# Patient Record
Sex: Female | Born: 1950 | Race: White | Hispanic: No | Marital: Married | Smoking: Never smoker
Health system: Southern US, Community
[De-identification: ages and names within clinical notes are randomized; demographics above are authoritative.]

## PROBLEM LIST (undated history)

## (undated) ENCOUNTER — Emergency Department (HOSPITAL_COMMUNITY): Discharge status: Absent without leave | Payer: Medicare Other

## (undated) DIAGNOSIS — I509 Heart failure, unspecified: Secondary | ICD-10-CM

## (undated) DIAGNOSIS — I5022 Chronic systolic (congestive) heart failure: Secondary | ICD-10-CM

## (undated) DIAGNOSIS — E785 Hyperlipidemia, unspecified: Secondary | ICD-10-CM

## (undated) DIAGNOSIS — I429 Cardiomyopathy, unspecified: Secondary | ICD-10-CM

## (undated) DIAGNOSIS — E039 Hypothyroidism, unspecified: Secondary | ICD-10-CM

## (undated) DIAGNOSIS — E119 Type 2 diabetes mellitus without complications: Secondary | ICD-10-CM

## (undated) DIAGNOSIS — I4891 Unspecified atrial fibrillation: Secondary | ICD-10-CM

## (undated) DIAGNOSIS — M109 Gout, unspecified: Secondary | ICD-10-CM

## (undated) DIAGNOSIS — Z95810 Presence of automatic (implantable) cardiac defibrillator: Secondary | ICD-10-CM

## (undated) DIAGNOSIS — I10 Essential (primary) hypertension: Secondary | ICD-10-CM

## (undated) DIAGNOSIS — I4892 Unspecified atrial flutter: Secondary | ICD-10-CM

## (undated) DIAGNOSIS — F32A Depression, unspecified: Secondary | ICD-10-CM

## (undated) DIAGNOSIS — Z9889 Other specified postprocedural states: Secondary | ICD-10-CM

## (undated) DIAGNOSIS — K219 Gastro-esophageal reflux disease without esophagitis: Secondary | ICD-10-CM

## (undated) DIAGNOSIS — F329 Major depressive disorder, single episode, unspecified: Secondary | ICD-10-CM

## (undated) HISTORY — DX: Essential (primary) hypertension: I10

## (undated) HISTORY — DX: Hypothyroidism, unspecified: E03.9

## (undated) HISTORY — PX: ANAL FISSURE REPAIR: SHX2312

## (undated) HISTORY — DX: Heart failure, unspecified: I50.9

## (undated) HISTORY — DX: Major depressive disorder, single episode, unspecified: F32.9

## (undated) HISTORY — PX: BACK SURGERY: SHX140

## (undated) HISTORY — DX: Depression, unspecified: F32.A

## (undated) HISTORY — PX: OTHER SURGICAL HISTORY: SHX169

## (undated) HISTORY — DX: Gout, unspecified: M10.9

## (undated) HISTORY — DX: Hyperlipidemia, unspecified: E78.5

---

## 2013-07-23 DIAGNOSIS — Z9889 Other specified postprocedural states: Secondary | ICD-10-CM

## 2013-07-23 HISTORY — DX: Other specified postprocedural states: Z98.890

## 2013-09-10 DIAGNOSIS — Z95810 Presence of automatic (implantable) cardiac defibrillator: Secondary | ICD-10-CM

## 2013-09-10 HISTORY — DX: Presence of automatic (implantable) cardiac defibrillator: Z95.810

## 2015-10-01 ENCOUNTER — Other Ambulatory Visit (HOSPITAL_COMMUNITY): Payer: Self-pay | Admitting: Internal Medicine

## 2015-10-01 DIAGNOSIS — Z1231 Encounter for screening mammogram for malignant neoplasm of breast: Secondary | ICD-10-CM

## 2015-10-12 ENCOUNTER — Other Ambulatory Visit (HOSPITAL_COMMUNITY)
Admission: RE | Admit: 2015-10-12 | Discharge: 2015-10-12 | Disposition: A | Discharge status: Home / Self Care | Payer: Medicare PPO | Source: Ambulatory Visit | Attending: Obstetrics & Gynecology | Admitting: Obstetrics & Gynecology

## 2015-10-12 ENCOUNTER — Encounter: Payer: Self-pay | Admitting: Obstetrics & Gynecology

## 2015-10-12 ENCOUNTER — Ambulatory Visit (INDEPENDENT_AMBULATORY_CARE_PROVIDER_SITE_OTHER): Payer: Medicare PPO | Admitting: Obstetrics & Gynecology

## 2015-10-12 VITALS — BP 120/80 | HR 72 | Ht 64.0 in | Wt 313.4 lb

## 2015-10-12 DIAGNOSIS — Z01419 Encounter for gynecological examination (general) (routine) without abnormal findings: Secondary | ICD-10-CM | POA: Insufficient documentation

## 2015-10-12 DIAGNOSIS — Z1151 Encounter for screening for human papillomavirus (HPV): Secondary | ICD-10-CM | POA: Diagnosis present

## 2015-10-12 DIAGNOSIS — Z1211 Encounter for screening for malignant neoplasm of colon: Secondary | ICD-10-CM

## 2015-10-12 DIAGNOSIS — Z1212 Encounter for screening for malignant neoplasm of rectum: Secondary | ICD-10-CM

## 2015-10-12 DIAGNOSIS — Z0000 Encounter for general adult medical examination without abnormal findings: Secondary | ICD-10-CM

## 2015-10-12 MED ORDER — TERCONAZOLE 0.4 % VA CREA: 1.0000 | TOPICAL_CREAM | Freq: Every day | VAGINAL | Status: DC

## 2015-10-12 NOTE — Addendum Note (Signed)
Addended by: Federico Flake A on: 10/12/2015 03:09 PM   Modules accepted: Orders

## 2015-10-12 NOTE — Progress Notes (Signed)
Patient ID: Donna Graves, female   DOB: 27-Sep-1950, 65 y.o.   MRN: [id number] Subjective:     Donna Graves is a 65 y.o. female here for a routine exam.  No LMP recorded. No obstetric history on file. Birth Control Method:  Post menopausal Menstrual Calendar(currently): amenorrheic x 13 years  Current complaints: none.   Current acute medical issues:     Recent Gynecologic History No LMP recorded. Last Pap: 2013,  normal Last mammogram: 2017,  normal  Past Medical History  Diagnosis Date  . Diabetes mellitus without complication (HCC)   . Hypertension   . Hyperlipidemia   . Gout     Past Surgical History  Procedure Laterality Date  . Back surgery    . Visual merchandiser    . Left toe surgery      OB History    No data available      Social History   Social History  . Marital Status: Married    Spouse Name: N/A  . Number of Children: N/A  . Years of Education: N/A   Social History Main Topics  . Smoking status: Never Smoker   . Smokeless tobacco: None  . Alcohol Use: None  . Drug Use: None  . Sexual Activity: Not Asked   Other Topics Concern  . None   Social History Narrative  . None    Family History  Problem Relation Age of Onset  . Diabetes Mother   . Diabetes Brother      Current outpatient prescriptions:  .  carvedilol (COREG) 6.25 MG tablet, Take 6.25 mg by mouth 2 (two) times daily with a meal., Disp: , Rfl:  .  enalapril (VASOTEC) 10 MG tablet, Take 10 mg by mouth daily., Disp: , Rfl:  .  furosemide (LASIX) 80 MG tablet, Take 80 mg by mouth 2 (two) times daily., Disp: , Rfl:  .  glimepiride (AMARYL) 1 MG tablet, Take 1 mg by mouth daily with breakfast., Disp: , Rfl:  .  Lactobacillus-Inulin (CULTURELLE DIGESTIVE HEALTH PO), Take by mouth., Disp: , Rfl:  .  Misc Natural Products (BLACK CHERRY CONCENTRATE PO), Take by mouth., Disp: , Rfl:  .  Omega-3 Fatty Acids (FISH OIL) 600 MG CAPS, Take by mouth., Disp: , Rfl:  .  polycarbophil (FIBERCON)  625 MG tablet, Take 625 mg by mouth daily., Disp: , Rfl:  .  potassium chloride SA (K-DUR,KLOR-CON) 20 MEQ tablet, Take 20 mEq by mouth once., Disp: , Rfl:  .  Red Yeast Rice Extract (RED YEAST RICE PO), Take by mouth., Disp: , Rfl:  .  simvastatin (ZOCOR) 40 MG tablet, Take 40 mg by mouth daily., Disp: , Rfl:   Review of Systems  Review of Systems  Constitutional: Negative for fever, chills, weight loss, malaise/fatigue and diaphoresis.  HENT: Negative for hearing loss, ear pain, nosebleeds, congestion, sore throat, neck pain, tinnitus and ear discharge.   Eyes: Negative for blurred vision, double vision, photophobia, pain, discharge and redness.  Respiratory: Negative for cough, hemoptysis, sputum production, shortness of breath, wheezing and stridor.   Cardiovascular: Negative for chest pain, palpitations, orthopnea, claudication, leg swelling and PND.  Gastrointestinal: negative for abdominal pain. Negative for heartburn, nausea, vomiting, diarrhea, constipation, blood in stool and melena.  Genitourinary: Negative for dysuria, urgency, frequency, hematuria and flank pain.  Musculoskeletal: Negative for myalgias, back pain, joint pain and falls.  Skin: Negative for itching and rash.  Neurological: Negative for dizziness, tingling, tremors, sensory change, speech change, focal  weakness, seizures, loss of consciousness, weakness and headaches.  Endo/Heme/Allergies: Negative for environmental allergies and polydipsia. Does not bruise/bleed easily.  Psychiatric/Behavioral: Negative for depression, suicidal ideas, hallucinations, memory loss and substance abuse. The patient is not nervous/anxious and does not have insomnia.        Objective:  Blood pressure 120/80, pulse 72, height  (1.626 m), weight 313 lb 6.4 oz (142.157 kg).   Physical Exam  Vitals reviewed. Constitutional: She is oriented to person, place, and time. She appears well-developed and well-nourished.        Vulva is  normal without lesions Vagina is pink moist with yeast present Cervix normal in appearance and pap is done Uterus is normal size shape and contour Adnexa is negative with normal sized ovaries  {Rectal    hemoccult negative, normal tone, no masses  Musculoskeletal: Normal range of motion. She exhibits no edema and no tenderness.  Neurological: She is alert and oriented to person, place, and time. She has normal reflexes. She displays normal reflexes. No cranial nerve deficit. She exhibits normal muscle tone. Coordination normal.  Skin: Skin is warm and dry. No rash noted. No erythema. No pallor.  Psychiatric: She has a normal mood and affect. Her behavior is normal. Judgment and thought content normal.       Assessment:    Healthy female exam.    Plan:    Follow up in: 2 years. terazol 7 for vaginal yeast

## 2015-10-14 LAB — CYTOLOGY - PAP

## 2015-10-15 ENCOUNTER — Ambulatory Visit (HOSPITAL_COMMUNITY)
Admission: RE | Admit: 2015-10-15 | Discharge: 2015-10-15 | Disposition: A | Discharge status: Home / Self Care | Payer: Medicare PPO | Source: Ambulatory Visit | Attending: Internal Medicine | Admitting: Internal Medicine

## 2015-10-15 DIAGNOSIS — Z1231 Encounter for screening mammogram for malignant neoplasm of breast: Secondary | ICD-10-CM | POA: Diagnosis present

## 2015-10-21 ENCOUNTER — Other Ambulatory Visit: Payer: Self-pay | Admitting: Internal Medicine

## 2015-10-21 DIAGNOSIS — R928 Other abnormal and inconclusive findings on diagnostic imaging of breast: Secondary | ICD-10-CM

## 2015-11-03 ENCOUNTER — Other Ambulatory Visit: Payer: Self-pay | Admitting: Internal Medicine

## 2015-11-03 DIAGNOSIS — R928 Other abnormal and inconclusive findings on diagnostic imaging of breast: Secondary | ICD-10-CM

## 2015-11-03 DIAGNOSIS — R921 Mammographic calcification found on diagnostic imaging of breast: Secondary | ICD-10-CM

## 2015-11-04 ENCOUNTER — Other Ambulatory Visit (HOSPITAL_COMMUNITY): Payer: Self-pay | Admitting: Internal Medicine

## 2015-11-04 DIAGNOSIS — R921 Mammographic calcification found on diagnostic imaging of breast: Secondary | ICD-10-CM

## 2015-11-04 DIAGNOSIS — R928 Other abnormal and inconclusive findings on diagnostic imaging of breast: Secondary | ICD-10-CM

## 2015-11-05 ENCOUNTER — Ambulatory Visit (HOSPITAL_COMMUNITY)
Admission: RE | Admit: 2015-11-05 | Discharge: 2015-11-05 | Disposition: A | Discharge status: Home / Self Care | Payer: Medicare PPO | Source: Ambulatory Visit | Attending: Internal Medicine | Admitting: Internal Medicine

## 2015-11-05 DIAGNOSIS — R928 Other abnormal and inconclusive findings on diagnostic imaging of breast: Secondary | ICD-10-CM | POA: Insufficient documentation

## 2015-11-05 DIAGNOSIS — R921 Mammographic calcification found on diagnostic imaging of breast: Secondary | ICD-10-CM

## 2015-11-10 ENCOUNTER — Encounter (HOSPITAL_COMMUNITY): Payer: Medicare PPO

## 2017-02-01 ENCOUNTER — Ambulatory Visit (HOSPITAL_COMMUNITY)
Admission: RE | Admit: 2017-02-01 | Discharge: 2017-02-01 | Disposition: A | Discharge status: Home / Self Care | Payer: Medicare PPO | Source: Ambulatory Visit | Attending: Internal Medicine | Admitting: Internal Medicine

## 2017-02-01 ENCOUNTER — Other Ambulatory Visit (HOSPITAL_COMMUNITY): Payer: Self-pay | Admitting: Internal Medicine

## 2017-02-01 DIAGNOSIS — I517 Cardiomegaly: Secondary | ICD-10-CM | POA: Diagnosis not present

## 2017-02-01 DIAGNOSIS — R0600 Dyspnea, unspecified: Secondary | ICD-10-CM

## 2017-03-08 ENCOUNTER — Encounter: Payer: Self-pay | Admitting: *Deleted

## 2017-03-09 ENCOUNTER — Encounter: Payer: Self-pay | Admitting: Internal Medicine

## 2017-03-09 ENCOUNTER — Ambulatory Visit (INDEPENDENT_AMBULATORY_CARE_PROVIDER_SITE_OTHER): Payer: Medicare PPO | Admitting: Internal Medicine

## 2017-03-09 ENCOUNTER — Other Ambulatory Visit (HOSPITAL_COMMUNITY)
Admission: RE | Admit: 2017-03-09 | Discharge: 2017-03-09 | Disposition: A | Discharge status: Home / Self Care | Payer: Medicare PPO | Source: Ambulatory Visit | Attending: Internal Medicine | Admitting: Internal Medicine

## 2017-03-09 VITALS — BP 124/76 | HR 77 | Ht 64.0 in | Wt 325.0 lb

## 2017-03-09 DIAGNOSIS — E782 Mixed hyperlipidemia: Secondary | ICD-10-CM | POA: Diagnosis not present

## 2017-03-09 DIAGNOSIS — I5022 Chronic systolic (congestive) heart failure: Secondary | ICD-10-CM | POA: Diagnosis present

## 2017-03-09 DIAGNOSIS — Z950 Presence of cardiac pacemaker: Secondary | ICD-10-CM

## 2017-03-09 LAB — COMPREHENSIVE METABOLIC PANEL
ALBUMIN: 3.9 g/dL (ref 3.5–5.0)
ALT: 14 U/L (ref 14–54)
AST: 28 U/L (ref 15–41)
Alkaline Phosphatase: 91 U/L (ref 38–126)
Anion gap: 11 (ref 5–15)
BILIRUBIN TOTAL: 1.5 mg/dL — AB (ref 0.3–1.2)
BUN: 14 mg/dL (ref 6–20)
CALCIUM: 8.9 mg/dL (ref 8.9–10.3)
CO2: 28 mmol/L (ref 22–32)
Chloride: 100 mmol/L — ABNORMAL LOW (ref 101–111)
Creatinine, Ser: 0.7 mg/dL (ref 0.44–1.00)
GFR calc Af Amer: >60 mL/min (ref >60)
GLUCOSE: 165 mg/dL — AB (ref 65–99)
Potassium: 3.5 mmol/L (ref 3.5–5.1)
Sodium: 139 mmol/L (ref 135–145)
TOTAL PROTEIN: 6.7 g/dL (ref 6.5–8.1)

## 2017-03-09 LAB — BRAIN NATRIURETIC PEPTIDE: B Natriuretic Peptide: 305 pg/mL — ABNORMAL HIGH (ref 0.0–100.0)

## 2017-03-09 NOTE — Patient Instructions (Signed)
Your physician recommends that you schedule a follow-up appointment in:  To be determined after Echocardiogram   Get Lab work today : CMET, BNP  Your physician has requested that you have an echocardiogram. Echocardiography is a painless test that uses sound waves to create images of your heart. It provides your doctor with information about the size and shape of your heart and how well your heart's chambers and valves are working. This procedure takes approximately one hour. There are no restrictions for this procedure.    Your physician recommends that you continue on your current medications as directed. Please refer to the Current Medication list given to you today.   We will call you  with Echo results. I have requested your records from Dr Earlene Plater      Thank you for choosing [facility] !

## 2017-03-09 NOTE — Progress Notes (Addendum)
Cardiology Office Note   Date:  03/09/2017   ID:  Donna Graves, DOB 28-May-1951, MRN [id number]  PCP:  Wilson Singer, MD  Cardiologist:   Dietrich Pates, MD   Pt referrred by Dr Karilyn Cota for eval/Rx of systolic CHF      History of Present Illness: Donna Graves is a 66 y.o. female with a history systolic CHF  She has been followed by a Dr Earlene Plater, originally in [place]  Now followed in [place] office  She was last seen in March 2018    Pt says she first started seeing him in early 2000s Cath done in 2000s  Told everything ws "clear" Told pumping function down (30 to 35%)  Heart wanting to go real fast   She is s/p PPM in 2005 and repeatin 2014 ([employer])    The pt says she was doing OK without swelling or without SOB until about 1 month ago  Then legs got swollen and she says she now gives out with actvity  Notes nausea Seen by Dr Karilyn Cota in May and lasix started  Seen back a couple times  Lasix increased and labs done   Has had some improvement but not at baseline    Pt denies CP  Sleeps chronically with head elevated a little  No PND   Does describe decreased appetite and dyspnea   Renal function done on 6/10     Current Meds  Medication Sig  . carvedilol (COREG) 12.5 MG tablet TK 1 T PO  BID. PATIENT NEEDS TO BE SEEN  . enalapril (VASOTEC) 10 MG tablet Take 10 mg by mouth daily.  . furosemide (LASIX) 80 MG tablet Take 160 mg by mouth 2 (two) times daily. Takes 160 mg BID per Dr Karilyn Cota  . glimepiride (AMARYL) 1 MG tablet Take 1 mg by mouth daily with breakfast.  . Lactobacillus-Inulin (CULTURELLE DIGESTIVE HEALTH PO) Take by mouth.  . Misc Natural Products (BLACK CHERRY CONCENTRATE PO) Take by mouth.  . Omega-3 Fatty Acids (FISH OIL) 600 MG CAPS Take by mouth.  . polycarbophil (FIBERCON) 625 MG tablet Take 625 mg by mouth daily.  . potassium chloride SA (K-DUR,KLOR-CON) 20 MEQ tablet Take 20 mEq by mouth 2 (two) times daily.  . Red Yeast Rice Extract (RED YEAST RICE  PO) Take by mouth.  . simvastatin (ZOCOR) 40 MG tablet Take 40 mg by mouth daily.  Marland Kitchen terconazole (TERAZOL 7) 0.4 % vaginal cream Place 1 applicator vaginally at bedtime.     Allergies:   Codeine   Past Medical History:  Diagnosis Date  . CHF (congestive heart failure) (HCC)   . Depression   . Diabetes mellitus without complication (HCC)   . 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 Gout   . Hyperlipidemia   . Hypertension   . Hypothyroidism     Past Surgical History:  Procedure Laterality Date  . BACK SURGERY    . left toe surgery    . pace maker       Social History:  The patient  reports that she has never smoked. She has never used smokeless tobacco.   Family History:  The patient's family history includes Arthritis (age of onset: 37) in her father; Diabetes (age of onset: 53) in her brother; Diabetes (age of onset: 6) in her mother; Heart attack in her mother; Hypertension in her mother.    ROS:  Please see the history of  present illness. All other systems are reviewed and  Negative to the above problem except as noted.    PHYSICAL EXAM: VS:  BP 124/76 (BP Location: Right Arm)   Pulse 77   Ht 5\' 4"  (1.626 m)   Wt (!) 147.4 kg (325 lb)   SpO2 94%   BMI 55.79 kg/m   GEN:  Morbidly obese 66 yo in NAD  Examined in chair   HEENT: normal  Neck: Neck is full  Difficult to assess JVP  No, carotid bruits, or masses Cardiac: RRR; no murmurs, rubs, or gallops,Distant heart sounds   1+ edema   Respiratory:  clear to auscultation bilaterally, normal work of breathing GI:   Distended   Pannus  Nontender  No definte masses  MS: no deformity Moving all extremities   Skin: warm and dry, no rash Neuro:  Strength and sensation are intact Psych: euthymic mood, full affect   EKG:  EKG is ordered today. SR 79 bpm  Paced   Lipid Panel No results found for: CHOL, TRIG, HDL, CHOLHDL, VLDL, LDLCALC, LDLDIRECT    Wt Readings from Last 3  Encounters:  03/09/17 (!) 147.4 kg (325 lb)  10/12/15 (!) 142.2 kg (313 lb 6.4 oz)      ASSESSMENT AND PLAN:  1  Chronic systolic CHF   Pt was compensated until recently  Not sure what lead to clinical decline  Will get records from [place]  Set up for echo  Get BMET    Changes in meds baed on results  2.  Hx PPM  Get recoreds from [place]    3  HL  Get records re lipids  Continue statin     Current medicines are reviewed at length with the patient today.  The patient does not have concerns regarding medicines.  ADDENDUM  ( 03/16/17)  Atrial flutter  S/p ablation attemps in 2006  Unsuccessful; s/p AV node ablation with single chamber pacer.  Replaced with dual chamber (AV) in 2014 Seen by Dr c Collier Bullock and S. Irene Limbo   Pacer intrrogated in March 2018  Metronic DDDR  Pacingin 100%    Nonobstructive CAD  By cath November  2014 HTN HL Diabetes NSVT  Echo Nov 2014 LVEF low normal to mildly depressed  RV difficult to see  Mod TR  Signed, Dietrich Pates, MD  03/09/2017 10:01 AM    [facility] [address] [place], [place], [place]  [address] Phone: [phone]; Fax: [phone]

## 2017-03-17 ENCOUNTER — Ambulatory Visit (HOSPITAL_COMMUNITY)
Admission: RE | Admit: 2017-03-17 | Discharge: 2017-03-17 | Disposition: A | Discharge status: Home / Self Care | Payer: Medicare PPO | Source: Ambulatory Visit | Attending: Internal Medicine | Admitting: Internal Medicine

## 2017-03-17 DIAGNOSIS — I081 Rheumatic disorders of both mitral and tricuspid valves: Secondary | ICD-10-CM | POA: Diagnosis not present

## 2017-03-17 DIAGNOSIS — I5022 Chronic systolic (congestive) heart failure: Secondary | ICD-10-CM | POA: Diagnosis present

## 2017-03-17 NOTE — Progress Notes (Signed)
*  PRELIMINARY RESULTS* Echocardiogram 2D Echocardiogram has been performed.  Stacey Drain 03/17/2017, 12:45 PM

## 2017-03-21 ENCOUNTER — Telehealth: Payer: Self-pay

## 2017-03-21 DIAGNOSIS — Z79899 Other long term (current) drug therapy: Secondary | ICD-10-CM

## 2017-03-21 MED ORDER — METOLAZONE 2.5 MG PO TABS: ORAL_TABLET | ORAL | 3 refills | Status: DC

## 2017-03-21 NOTE — Telephone Encounter (Signed)
-----   Message from Dietrich Pates V, MD sent at 03/20/2017 10:00 PM EDT ----- Echo shows pumping function has declined from what was reported from one done in [place]   Need to  1  Get appt in pacemaker clinic to evaluate  Pt may need pacer upgrade Would be good to get operative note from where device was put in 3.  She can try 2.5 Zaoxyln 2x per week (30 min before lasix given  )   Get BMET and BNP in 2 wk    4  Watch and cut back on salt intake

## 2017-03-21 NOTE — Telephone Encounter (Signed)
Pt notified of echo results, will take zaroxolyn 2.5 mg twice a week, 30 minutes before lasix, will get apt for pacer clinic,she will limit sodium intake, mailed lab slips   Apt made for pacemaker clinic Dr Ladona Ridgel 8/30 at 9 am

## 2017-03-30 ENCOUNTER — Observation Stay (HOSPITAL_COMMUNITY)
Admission: EM | Admit: 2017-03-30 | Discharge: 2017-03-31 | Disposition: A | Discharge status: Home / Self Care | Payer: Medicare PPO | Attending: Internal Medicine | Admitting: Internal Medicine

## 2017-03-30 ENCOUNTER — Encounter (HOSPITAL_COMMUNITY): Payer: Self-pay | Admitting: Emergency Medicine

## 2017-03-30 ENCOUNTER — Emergency Department (HOSPITAL_COMMUNITY): Payer: Medicare PPO

## 2017-03-30 DIAGNOSIS — R748 Abnormal levels of other serum enzymes: Secondary | ICD-10-CM

## 2017-03-30 DIAGNOSIS — Z950 Presence of cardiac pacemaker: Secondary | ICD-10-CM | POA: Diagnosis not present

## 2017-03-30 DIAGNOSIS — I5022 Chronic systolic (congestive) heart failure: Secondary | ICD-10-CM | POA: Diagnosis not present

## 2017-03-30 DIAGNOSIS — E118 Type 2 diabetes mellitus with unspecified complications: Secondary | ICD-10-CM | POA: Diagnosis not present

## 2017-03-30 DIAGNOSIS — I2583 Coronary atherosclerosis due to lipid rich plaque: Secondary | ICD-10-CM

## 2017-03-30 DIAGNOSIS — E119 Type 2 diabetes mellitus without complications: Secondary | ICD-10-CM | POA: Diagnosis not present

## 2017-03-30 DIAGNOSIS — I509 Heart failure, unspecified: Secondary | ICD-10-CM | POA: Insufficient documentation

## 2017-03-30 DIAGNOSIS — I10 Essential (primary) hypertension: Secondary | ICD-10-CM | POA: Diagnosis present

## 2017-03-30 DIAGNOSIS — I480 Paroxysmal atrial fibrillation: Secondary | ICD-10-CM | POA: Diagnosis present

## 2017-03-30 DIAGNOSIS — I2510 Atherosclerotic heart disease of native coronary artery without angina pectoris: Secondary | ICD-10-CM | POA: Diagnosis not present

## 2017-03-30 DIAGNOSIS — Z79899 Other long term (current) drug therapy: Secondary | ICD-10-CM | POA: Diagnosis not present

## 2017-03-30 DIAGNOSIS — R0789 Other chest pain: Secondary | ICD-10-CM

## 2017-03-30 DIAGNOSIS — J810 Acute pulmonary edema: Secondary | ICD-10-CM | POA: Diagnosis not present

## 2017-03-30 DIAGNOSIS — I5023 Acute on chronic systolic (congestive) heart failure: Secondary | ICD-10-CM | POA: Diagnosis present

## 2017-03-30 DIAGNOSIS — R55 Syncope and collapse: Secondary | ICD-10-CM | POA: Diagnosis not present

## 2017-03-30 DIAGNOSIS — E039 Hypothyroidism, unspecified: Secondary | ICD-10-CM | POA: Insufficient documentation

## 2017-03-30 LAB — BRAIN NATRIURETIC PEPTIDE: B NATRIURETIC PEPTIDE 5: 288 pg/mL — AB (ref 0.0–100.0)

## 2017-03-30 LAB — URINALYSIS, ROUTINE W REFLEX MICROSCOPIC
BILIRUBIN URINE: NEGATIVE
Glucose, UA: NEGATIVE mg/dL
Hgb urine dipstick: NEGATIVE
Ketones, ur: NEGATIVE mg/dL
LEUKOCYTES UA: NEGATIVE
NITRITE: NEGATIVE
PH: 7 (ref 5.0–8.0)
Protein, ur: NEGATIVE mg/dL
SPECIFIC GRAVITY, URINE: 1.014 (ref 1.005–1.030)

## 2017-03-30 LAB — D-DIMER, QUANTITATIVE (NOT AT ARMC): D DIMER QUANT: 0.37 ug{FEU}/mL (ref 0.00–0.50)

## 2017-03-30 LAB — CBC
HEMATOCRIT: 52.6 % — AB (ref 36.0–46.0)
HEMOGLOBIN: 17.1 g/dL — AB (ref 12.0–15.0)
MCH: 29.5 pg (ref 26.0–34.0)
MCHC: 32.5 g/dL (ref 30.0–36.0)
MCV: 90.7 fL (ref 78.0–100.0)
Platelets: 157 10*3/uL (ref 150–400)
RBC: 5.8 MIL/uL — AB (ref 3.87–5.11)
RDW: 14.2 % (ref 11.5–15.5)
WBC: 6.9 10*3/uL (ref 4.0–10.5)

## 2017-03-30 LAB — HEPATIC FUNCTION PANEL
ALBUMIN: 3.9 g/dL (ref 3.5–5.0)
ALK PHOS: 118 U/L (ref 38–126)
ALT: 17 U/L (ref 14–54)
AST: 39 U/L (ref 15–41)
Bilirubin, Direct: 0.3 mg/dL (ref 0.1–0.5)
Indirect Bilirubin: 1.1 mg/dL — ABNORMAL HIGH (ref 0.3–0.9)
TOTAL PROTEIN: 6.6 g/dL (ref 6.5–8.1)
Total Bilirubin: 1.4 mg/dL — ABNORMAL HIGH (ref 0.3–1.2)

## 2017-03-30 LAB — BASIC METABOLIC PANEL
ANION GAP: 18 — AB (ref 5–15)
BUN: 26 mg/dL — ABNORMAL HIGH (ref 6–20)
CO2: 28 mmol/L (ref 22–32)
Calcium: 9.1 mg/dL (ref 8.9–10.3)
Chloride: 90 mmol/L — ABNORMAL LOW (ref 101–111)
Creatinine, Ser: 1.01 mg/dL — ABNORMAL HIGH (ref 0.44–1.00)
GFR calc Af Amer: >60 mL/min (ref >60)
GFR, EST NON AFRICAN AMERICAN: 58 mL/min — AB (ref >60)
GLUCOSE: 247 mg/dL — AB (ref 65–99)
POTASSIUM: 3.5 mmol/L (ref 3.5–5.1)
Sodium: 136 mmol/L (ref 135–145)

## 2017-03-30 LAB — I-STAT TROPONIN, ED: Troponin i, poc: 0.03 ng/mL (ref 0.00–0.08)

## 2017-03-30 LAB — CBG MONITORING, ED: Glucose-Capillary: 277 mg/dL — ABNORMAL HIGH (ref 65–99)

## 2017-03-30 LAB — TROPONIN I: TROPONIN I: 0.03 ng/mL — AB (ref <0.03)

## 2017-03-30 MED ORDER — SODIUM CHLORIDE 0.9 % IV BOLUS (SEPSIS)
500.0000 mL | Freq: Once | INTRAVENOUS | Status: AC
  Administered 2017-03-31: 500 mL via INTRAVENOUS

## 2017-03-30 MED ORDER — SODIUM CHLORIDE 0.9 % IV BOLUS (SEPSIS)
500.0000 mL | Freq: Once | INTRAVENOUS | Status: AC
  Administered 2017-03-30: 500 mL via INTRAVENOUS

## 2017-03-30 MED ORDER — MECLIZINE HCL 12.5 MG PO TABS
25.0000 mg | ORAL_TABLET | Freq: Once | ORAL | Status: AC
  Administered 2017-03-30: 25 mg via ORAL
  Filled 2017-03-30: qty 2

## 2017-03-30 NOTE — ED Triage Notes (Signed)
Pt c/o nausea, dizziness and states she feels like she is going to pass out all day.

## 2017-03-30 NOTE — ED Notes (Signed)
Date and time results received: 03/30/17  (use smartphrase ".now" to insert current time)  Test: trop Critical Value: 0.03  Name of Provider Notified: yelverton  Orders Received? none

## 2017-03-30 NOTE — ED Provider Notes (Signed)
[facility]EM[facility]Y DEPT Provider Note   CSN: [id number] Arrival date & time: 03/30/17  1935     History   Chief Complaint Chief Complaint  Patient presents with  . Weakness    HPI Donna Graves is a 66 y.o. female.  HPI Patient states she had left-sided chest discomfort this morning with waking. Associated with nausea. She's had persistent nausea throughout the day. Around 5:00 this evening she went to stand up from a sitting position and became very lightheaded. Noticed by her husband to be pale. She then sat back down. She states she is feeling better currently. She takes Lasix 80 mg twice daily. She states she has had some lower extremity swelling but that this is chronic for her. Denies any current chest pain. No shortness of breath. No diarrhea but she has had constipation for several days. Denies fever or chills. Past Medical History:  Diagnosis Date  . CHF (congestive heart failure) (HCC)   . Depression   . Diabetes mellitus without complication (HCC)   . 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 Gout   . Hyperlipidemia   . Hypertension   . Hypothyroidism     There are no active problems to display for this patient.   Past Surgical History:  Procedure Laterality Date  . BACK SURGERY    . left toe surgery    . pace maker      OB History    No data available       Home Medications    Prior to Admission medications   Medication Sig Start Date End Date Taking? Authorizing Provider  acidophilus (RISAQUAD) CAPS capsule Take 1 capsule by mouth daily. ULTIMATE FLORA PROBIOTIC   Yes [provider]  carvedilol (COREG) 12.5 MG tablet TAKE ONE TABLET BY MOUTH TWICE DAILY 03/01/17  Yes [provider]  Cholecalciferol (VITAMIN D PO) Take 7,000 Units by mouth daily.   Yes [provider]  dicyclomine (BENTYL) 10 MG capsule Take 10 mg by mouth 3 (three) times daily as needed for spasms.   Yes [provider]  enalapril (VASOTEC) 10 MG tablet Take 10 mg by mouth 2 (two) times daily.    Yes [provider]  furosemide (LASIX) 80 MG tablet Take 160 mg by mouth 2 (two) times daily.  03/07/17  Yes [provider]  glimepiride (AMARYL) 2 MG tablet Take 2 mg by mouth daily. BEFORE A MEAL   Yes [provider]  magnesium oxide (MAG-OX) 400 MG tablet Take 400 mg by mouth daily.   Yes [provider]  metolazone (ZAROXOLYN) 2.5 MG tablet Take 2.5 mg two times a week Monday and Thursday, 30 minutes before lasix 03/21/17  Yes Pricilla Riffle, MD  Misc Natural Products (BLACK CHERRY CONCENTRATE PO) Take 2 tablets by mouth daily.    Yes [provider]  Multiple Vitamins-Minerals (ALIVE ONCE DAILY WOMENS 50+ PO) Take 1 tablet by mouth daily.   Yes [provider]  omeprazole (PRILOSEC) 20 MG capsule Take 20 mg by mouth every morning.   Yes [provider]  polycarbophil (FIBERCON) 625 MG tablet Take 625 mg by mouth 4 (four) times daily.   Yes [provider]  potassium chloride SA (K-DUR,KLOR-CON) 20 MEQ tablet Take 20 mEq by mouth 2 (two) times daily.   Yes [provider]  Red Yeast Rice Extract (RED YEAST RICE PO) Take 2 tablets by mouth daily.  Yes [provider]  simvastatin (ZOCOR) 40 MG tablet Take 40 mg by mouth every evening.    Yes [provider]    Family History Family History  Problem Relation Age of Onset  . Diabetes Mother 40  . Heart attack Mother   . Hypertension Mother   . Diabetes Brother 53  . Arthritis Father 48    Social History Social History  Substance Use Topics  . Smoking status: Never Smoker  . Smokeless tobacco: Never Used  . Alcohol use No     Allergies   Codeine   Review of Systems Review of Systems  Constitutional: Positive for fatigue. Negative for chills and fever.  HENT: Negative for trouble swallowing and voice change.   Eyes: Negative for visual  disturbance.  Respiratory: Negative for cough and shortness of breath.   Cardiovascular: Positive for leg swelling. Negative for chest pain and palpitations.  Gastrointestinal: Positive for constipation and nausea. Negative for abdominal pain, diarrhea and vomiting.  Genitourinary: Negative for dysuria, flank pain and frequency.  Musculoskeletal: Negative for back pain, myalgias, neck pain and neck stiffness.  Skin: Negative for rash and wound.  Neurological: Positive for dizziness and light-headedness. Negative for syncope, weakness, numbness and headaches.  All other systems reviewed and are negative.    Physical Exam Updated Vital Signs BP 116/74   Pulse 71   Temp 98 F (36.7 C) (Oral)   Resp (!) 21   Ht 5\' 4"  (1.626 m)   Wt (!) 147.4 kg (325 lb)   SpO2 93%   BMI 55.79 kg/m   Physical Exam  Constitutional: She is oriented to person, place, and time. She appears well-developed and well-nourished. No distress.  HENT:  Head: Normocephalic and atraumatic.  Mouth/Throat: Oropharynx is clear and moist. No oropharyngeal exudate.  Eyes: Pupils are equal, round, and reactive to light. EOM are normal. Right eye exhibits no discharge. Left eye exhibits no discharge. No scleral icterus.  Patient has a fatigable horizontal nystagmus  Neck: Normal range of motion. Neck supple. No tracheal deviation present. No thyromegaly present.  Cardiovascular: Normal rate and regular rhythm.  Exam reveals no gallop and no friction rub.   No murmur heard. Pulmonary/Chest: Effort normal and breath sounds normal. No stridor. No respiratory distress. She has no wheezes. She has no rales. She exhibits no tenderness.  Decreased breath sounds in the right base.  Abdominal: Soft. Bowel sounds are normal. There is no tenderness. There is no rebound and no guarding.  Musculoskeletal: Normal range of motion. She exhibits edema. She exhibits no tenderness.  Bilateral lower extremity edema. Right calf appears  slightly larger than the left calf.  Lymphadenopathy:    She has no cervical adenopathy.  Neurological: She is alert and oriented to person, place, and time.  Patient is alert and oriented x3 with clear, goal oriented speech. Patient has 5/5 motor in all extremities. Sensation is intact to light touch. Bilateral finger-to-nose is normal with no signs of dysmetria.  Skin: Skin is warm and dry. No rash noted. No erythema.  Psychiatric: She has a normal mood and affect. Her behavior is normal.  Nursing note and vitals reviewed.    ED Treatments / Results  Labs (all labs ordered are listed, but only abnormal results are displayed) Labs Reviewed  BASIC METABOLIC PANEL - Abnormal; Notable for the following:       Result Value   Chloride 90 (*)    Glucose, Bld 247 (*)    BUN 26 (*)  Creatinine, Ser 1.01 (*)    GFR calc non Af Amer 58 (*)    Anion gap 18 (*)    All other components within normal limits  CBC - Abnormal; Notable for the following:    RBC 5.80 (*)    Hemoglobin 17.1 (*)    HCT 52.6 (*)    All other components within normal limits  HEPATIC FUNCTION PANEL - Abnormal; Notable for the following:    Total Bilirubin 1.4 (*)    Indirect Bilirubin 1.1 (*)    All other components within normal limits  BRAIN NATRIURETIC PEPTIDE - Abnormal; Notable for the following:    B Natriuretic Peptide 288.0 (*)    All other components within normal limits  TROPONIN I - Abnormal; Notable for the following:    Troponin I 0.03 (*)    All other components within normal limits  CBG MONITORING, ED - Abnormal; Notable for the following:    Glucose-Capillary 277 (*)    All other components within normal limits  URINALYSIS, ROUTINE W REFLEX MICROSCOPIC  D-DIMER, QUANTITATIVE (NOT AT [facility])    EKG  EKG Interpretation  Date/Time:  Thursday March 30 2017 19:46:18 EDT Ventricular Rate:  73 PR Interval:  186 QRS Duration: 162 QT Interval:  452 QTC Calculation: 497 R Axis:   34 Text  Interpretation:  AV dual-paced rhythm Abnormal ECG Confirmed by Ranae Palms  MD, Leyland Kenna ([id number]) on 03/30/2017 8:29:27 PM       Radiology Dg Chest 2 View  Result Date: 03/30/2017 CLINICAL DATA:  Initial evaluation for acute chest pain, nausea, dizziness. EXAM: CHEST  2 VIEW COMPARISON:  Prior radiograph from 02/01/2017. FINDINGS: Left-sided Raynelle Fanning transvenous pacemaker/ AICD in place. Stable cardiomegaly. Mediastinal silhouette normal. Lungs hypoinflated. Diffuse pulmonary vascular congestion with interstitial prominence, suggesting mild pulmonary interstitial edema. No focal infiltrates. No pleural effusion. No pneumothorax. No acute osseus abnormality. IMPRESSION: 1. Cardiomegaly with findings suggestion of mild diffuse pulmonary interstitial edema. 2. Left-sided transvenous pacemaker/AICD in place. Electronically Signed   By: Rise Mu M.D.   On: 03/30/2017 22:02    Procedures Procedures (including critical care time)  Medications Ordered in ED Medications  sodium chloride 0.9 % bolus 500 mL (not administered)  sodium chloride 0.9 % bolus 500 mL (500 mLs Intravenous New Bag/Given 03/30/17 2146)  meclizine (ANTIVERT) tablet 25 mg (25 mg Oral Given 03/30/17 2325)     Initial Impression / Assessment and Plan / ED Course  I have reviewed the triage vital signs and the nursing notes.  Pertinent labs & imaging results that were available during my care of the patient were reviewed by me and considered in my medical decision making (see chart for details).     Patient states she is feeling much better. She is able to ambulate without difficulty. Denies any lightheadedness or dizziness. No shortness of breath or chest pain. She does have pulmonary edema on chest x-ray though BNP has improved since last month. D-dimer is negative. Initial troponin is 0.03. Giving complicated cardiac history we'll admit for telemetry observation. Will need repeat troponin and pacemaker  interrogation.  Final Clinical Impressions(s) / ED Diagnoses   Final diagnoses:  Near syncope  Atypical chest pain  Acute pulmonary edema [facility])    New Prescriptions New Prescriptions   No medications on file     Loren Racer, MD 03/30/17 2346

## 2017-03-31 DIAGNOSIS — R55 Syncope and collapse: Secondary | ICD-10-CM | POA: Diagnosis not present

## 2017-03-31 DIAGNOSIS — R7989 Other specified abnormal findings of blood chemistry: Secondary | ICD-10-CM

## 2017-03-31 DIAGNOSIS — R0789 Other chest pain: Secondary | ICD-10-CM | POA: Insufficient documentation

## 2017-03-31 DIAGNOSIS — R778 Other specified abnormalities of plasma proteins: Secondary | ICD-10-CM | POA: Insufficient documentation

## 2017-03-31 LAB — BASIC METABOLIC PANEL
Anion gap: 9 (ref 5–15)
BUN: 23 mg/dL — ABNORMAL HIGH (ref 6–20)
CHLORIDE: 95 mmol/L — AB (ref 101–111)
CO2: 32 mmol/L (ref 22–32)
CREATININE: 0.71 mg/dL (ref 0.44–1.00)
Calcium: 8.7 mg/dL — ABNORMAL LOW (ref 8.9–10.3)
Glucose, Bld: 170 mg/dL — ABNORMAL HIGH (ref 65–99)
Potassium: 3.2 mmol/L — ABNORMAL LOW (ref 3.5–5.1)
SODIUM: 136 mmol/L (ref 135–145)

## 2017-03-31 LAB — CBC
HCT: 51.9 % — ABNORMAL HIGH (ref 36.0–46.0)
Hemoglobin: 16.5 g/dL — ABNORMAL HIGH (ref 12.0–15.0)
MCH: 29.3 pg (ref 26.0–34.0)
MCHC: 31.8 g/dL (ref 30.0–36.0)
MCV: 92 fL (ref 78.0–100.0)
Platelets: 146 10*3/uL — ABNORMAL LOW (ref 150–400)
RBC: 5.64 MIL/uL — AB (ref 3.87–5.11)
RDW: 14.2 % (ref 11.5–15.5)
WBC: 7.1 10*3/uL (ref 4.0–10.5)

## 2017-03-31 LAB — GLUCOSE, CAPILLARY
GLUCOSE-CAPILLARY: 199 mg/dL — AB (ref 65–99)
GLUCOSE-CAPILLARY: 182 mg/dL — AB (ref 65–99)
GLUCOSE-CAPILLARY: 266 mg/dL — AB (ref 65–99)

## 2017-03-31 LAB — TROPONIN I
TROPONIN I: 0.03 ng/mL — AB (ref <0.03)
Troponin I: 0.03 ng/mL (ref <0.03)

## 2017-03-31 MED ORDER — ACETAMINOPHEN 325 MG PO TABS: 650.0000 mg | ORAL_TABLET | Freq: Four times a day (QID) | ORAL | Status: DC | PRN

## 2017-03-31 MED ORDER — INSULIN ASPART 100 UNIT/ML ~~LOC~~ SOLN: 0.0000 [IU] | Freq: Every day | SUBCUTANEOUS | Status: DC

## 2017-03-31 MED ORDER — PANTOPRAZOLE SODIUM 40 MG PO TBEC
40.0000 mg | DELAYED_RELEASE_TABLET | Freq: Every day | ORAL | Status: DC
  Administered 2017-03-31: 40 mg via ORAL
  Filled 2017-03-31: qty 1

## 2017-03-31 MED ORDER — METOLAZONE 2.5 MG PO TABS: ORAL_TABLET | ORAL | 3 refills | Status: DC

## 2017-03-31 MED ORDER — INSULIN ASPART 100 UNIT/ML ~~LOC~~ SOLN
0.0000 [IU] | Freq: Three times a day (TID) | SUBCUTANEOUS | Status: DC
Start: 2017-03-31 — End: 2017-03-31
  Administered 2017-03-31: 3 [IU] via SUBCUTANEOUS
  Administered 2017-03-31: 5 [IU] via SUBCUTANEOUS

## 2017-03-31 MED ORDER — POTASSIUM CHLORIDE CRYS ER 20 MEQ PO TBCR
20.0000 meq | EXTENDED_RELEASE_TABLET | Freq: Two times a day (BID) | ORAL | Status: DC
  Administered 2017-03-31: 20 meq via ORAL
  Filled 2017-03-31: qty 1

## 2017-03-31 MED ORDER — MAGNESIUM OXIDE 400 (241.3 MG) MG PO TABS
400.0000 mg | ORAL_TABLET | Freq: Every day | ORAL | Status: DC
  Administered 2017-03-31: 400 mg via ORAL
  Filled 2017-03-31: qty 1

## 2017-03-31 MED ORDER — RISAQUAD PO CAPS
1.0000 | ORAL_CAPSULE | Freq: Every day | ORAL | Status: DC
  Administered 2017-03-31: 1 via ORAL
  Filled 2017-03-31: qty 1

## 2017-03-31 MED ORDER — FUROSEMIDE 80 MG PO TABS: 160.0000 mg | ORAL_TABLET | Freq: Two times a day (BID) | ORAL | Status: DC

## 2017-03-31 MED ORDER — ONDANSETRON HCL 4 MG PO TABS: 4.0000 mg | ORAL_TABLET | Freq: Four times a day (QID) | ORAL | Status: DC | PRN

## 2017-03-31 MED ORDER — CARVEDILOL 12.5 MG PO TABS
12.5000 mg | ORAL_TABLET | Freq: Two times a day (BID) | ORAL | Status: DC
  Administered 2017-03-31: 12.5 mg via ORAL
  Filled 2017-03-31: qty 1

## 2017-03-31 MED ORDER — BISACODYL 5 MG PO TBEC: 5.0000 mg | DELAYED_RELEASE_TABLET | Freq: Every day | ORAL | Status: DC | PRN

## 2017-03-31 MED ORDER — SODIUM CHLORIDE 0.9% FLUSH
3.0000 mL | Freq: Two times a day (BID) | INTRAVENOUS | Status: DC
  Administered 2017-03-31 (×2): 3 mL via INTRAVENOUS

## 2017-03-31 MED ORDER — SODIUM CHLORIDE 0.9 % IV SOLN: 250.0000 mL | INTRAVENOUS | Status: DC | PRN

## 2017-03-31 MED ORDER — ONDANSETRON HCL 4 MG/2ML IJ SOLN: 4.0000 mg | Freq: Four times a day (QID) | INTRAMUSCULAR | Status: DC | PRN

## 2017-03-31 MED ORDER — SIMVASTATIN 10 MG PO TABS: 40.0000 mg | ORAL_TABLET | Freq: Every evening | ORAL | Status: DC

## 2017-03-31 MED ORDER — SODIUM CHLORIDE 0.9% FLUSH: 3.0000 mL | INTRAVENOUS | Status: DC | PRN

## 2017-03-31 MED ORDER — ADULT MULTIVITAMIN W/MINERALS CH
1.0000 | ORAL_TABLET | Freq: Every day | ORAL | Status: DC
  Administered 2017-03-31: 1 via ORAL
  Filled 2017-03-31: qty 1

## 2017-03-31 MED ORDER — CALCIUM POLYCARBOPHIL 625 MG PO TABS
625.0000 mg | ORAL_TABLET | Freq: Four times a day (QID) | ORAL | Status: DC
  Administered 2017-03-31 (×2): 625 mg via ORAL
  Filled 2017-03-31 (×14): qty 1

## 2017-03-31 MED ORDER — ACETAMINOPHEN 650 MG RE SUPP: 650.0000 mg | Freq: Four times a day (QID) | RECTAL | Status: DC | PRN

## 2017-03-31 MED ORDER — ENOXAPARIN SODIUM 40 MG/0.4ML ~~LOC~~ SOLN
40.0000 mg | SUBCUTANEOUS | Status: DC
  Administered 2017-03-31: 40 mg via SUBCUTANEOUS
  Filled 2017-03-31: qty 0.4

## 2017-03-31 MED ORDER — SODIUM CHLORIDE 0.9% FLUSH: 3.0000 mL | Freq: Two times a day (BID) | INTRAVENOUS | Status: DC

## 2017-03-31 MED ORDER — DICYCLOMINE HCL 10 MG PO CAPS: 10.0000 mg | ORAL_CAPSULE | Freq: Three times a day (TID) | ORAL | Status: DC | PRN

## 2017-03-31 MED ORDER — POLYETHYLENE GLYCOL 3350 17 G PO PACK: 17.0000 g | PACK | Freq: Every day | ORAL | Status: DC | PRN

## 2017-03-31 MED ORDER — HYDROCODONE-ACETAMINOPHEN 5-325 MG PO TABS: 1.0000 | ORAL_TABLET | ORAL | Status: DC | PRN

## 2017-03-31 NOTE — Progress Notes (Signed)
Patient is to be discharged home and in stable condition. Patient and family present for discharge teaching and verbalize understanding. Patient escorted out via wheelchair by staff.  Lequita Halt P Dishmon

## 2017-03-31 NOTE — Consult Note (Addendum)
Primary cardiologist: Dr Dietrich Pates Consulting cardiologist: Dr Dina Rich Requesting physician: Dr Mariea Clonts Indication: dizziness, chronic systolic HF.   Clinical Summary Donna Graves is a 66 y.o.female 66 yo female history of chronic systolic HF. From clinic notes originally followed in [place]. Apparently had normal cath in early 2000s, LVEF around that time was 30-35%. She history of aflutter with failed ablation, now with av nodal ablation and single chamber pacemaker.   Recently started metolazone 2.5mg  twice a week 03/21/17. Had 10 lbs weight loss after starting in 1 week. Cr from 0.7 to 1.01 and BUN14 -->26  02/2017 echo: LVEF 25-30%, anteroseptal hypokinesis, mild RV dysfunction, PASP 29 K 3.5, Cr 1.01, Hgb 17.1, Plt 157, D-dimer neg, BNP 288,  Trop 0.03-->0.03--> CXR mild edema EKG low voltage, AV paced Device check: dual chamber pacemaker, normal function, no arrhythmias.   Allergies  Allergen Reactions  . Codeine Rash    Medications Scheduled Medications: . acidophilus  1 capsule Oral Daily  . carvedilol  12.5 mg Oral BID  . enoxaparin (LOVENOX) injection  40 mg Subcutaneous Q24H  . insulin aspart  0-5 Units Subcutaneous QHS  . insulin aspart  0-9 Units Subcutaneous TID WC  . magnesium oxide  400 mg Oral Daily  . multivitamin with minerals  1 tablet Oral Daily  . pantoprazole  40 mg Oral Daily  . polycarbophil  625 mg Oral QID  . potassium chloride SA  20 mEq Oral BID  . simvastatin  40 mg Oral QPM  . sodium chloride flush  3 mL Intravenous Q12H  . sodium chloride flush  3 mL Intravenous Q12H     Infusions: . sodium chloride       PRN Medications:  sodium chloride, acetaminophen **OR** acetaminophen, bisacodyl, dicyclomine, HYDROcodone-acetaminophen, ondansetron **OR** ondansetron (ZOFRAN) IV, polyethylene glycol, sodium chloride flush   Past Medical History:  Diagnosis Date  . CHF (congestive heart failure) (HCC)   . Depression   . Diabetes  mellitus without complication (HCC)   . 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 Gout   . Hyperlipidemia   . Hypertension   . Hypothyroidism     Past Surgical History:  Procedure Laterality Date  . BACK SURGERY    . left toe surgery    . pace maker      Family History  Problem Relation Age of Onset  . Diabetes Mother 21  . Heart attack Mother   . Hypertension Mother   . Diabetes Brother 13  . Arthritis Father 85    Social History Ms. Coggeshall reports that she has never smoked. She has never used smokeless tobacco. Ms. Settlemyre reports that she does not drink alcohol.  Review of Systems CONSTITUTIONAL: No weight loss, fever, chills, weakness or fatigue.  HEENT: Eyes: No visual loss, blurred vision, double vision or yellow sclerae. No hearing loss, sneezing, congestion, runny nose or sore throat.  SKIN: No rash or itching.  CARDIOVASCULAR: No chest pain, chest pressure or chest discomfort. No palpitations or edema.  RESPIRATORY: No shortness of breath, cough or sputum.  GASTROINTESTINAL: No anorexia, nausea, vomiting or diarrhea. No abdominal pain or blood.  GENITOURINARY: no polyuria, no dysuria NEUROLOGICAL: +dizziness MUSCULOSKELETAL: No muscle, back pain, joint pain or stiffness.  HEMATOLOGIC: No anemia, bleeding or bruising.  LYMPHATICS: No enlarged nodes. No history of splenectomy.  PSYCHIATRIC: No history of depression or anxiety.      Physical Examination Blood pressure (!) 140/49, pulse  79, temperature 98.3 F (36.8 C), temperature source Oral, resp. rate 20, height 5\' 4"  (1.626 m), weight (!) 319 lb 12.8 oz (145.1 kg), SpO2 93 %.  Intake/Output Summary (Last 24 hours) at 03/31/17 1315 Last data filed at 03/31/17 0500  Gross per 24 hour  Intake                0 ml  Output              500 ml  Net             -500 ml    HEENT: sclera clear, throat clear  Cardiovascular: RRR, no m/r/g, no jvd  Respiratory:  CTAB  GI: abdomen soft, NT, ND  MSK: trace bilateral LE edema  Neuro: no focal deficits  Psych: appropriate affect   Lab Results  Basic Metabolic Panel:  Recent Labs Lab 03/30/17 1946 03/31/17 0546  NA 136 136  K 3.5 3.2*  CL 90* 95*  CO2 28 32  GLUCOSE 247* 170*  BUN 26* 23*  CREATININE 1.01* 0.71  CALCIUM 9.1 8.7*    Liver Function Tests:  Recent Labs Lab 03/30/17 1946  AST 39  ALT 17  ALKPHOS 118  BILITOT 1.4*  PROT 6.6  ALBUMIN 3.9    CBC:  Recent Labs Lab 03/30/17 1946 03/31/17 0546  WBC 6.9 7.1  HGB 17.1* 16.5*  HCT 52.6* 51.9*  MCV 90.7 92.0  PLT 157 146*    Cardiac Enzymes:  Recent Labs Lab 03/30/17 2120 03/31/17 0546 03/31/17 1149  TROPONINI 0.03* 0.03* 0.03*    BNP: Invalid input(s): POCBNP   ECG   Imaging   Impression/Recommendations 1. Dizziness -suspect related to rapid diuresis, 10 lbs weight loss in a few days. Cr and Hgb trends consistent with hypovolemia - no arrhythmias by tele,normal device check - we will lower metolazone to 2.5mg  q Friday only (last dose was this past Monday)  2. Chronic systolic HF - has been on high dose lasix, recently started on metolazone 2.5mg  2 times a week - lower dose of metolazone to 2.5mg  every Friday. Could consider changing lasix to torsemide, may be able to maintain fluid status without metolazone. Defer to her primary cardiologist - may warrant consideration for BiV AICD given her history of systolic dysfunction and AV nodal ablation  3. Aflutter - history according to prior notes - normal rates.  - she reports being on coumadin for about 14 years. Her previus cardiologist tolder her 5 years ago she no longer needed it. She denies any adverse reactions - anticoag history remains unclear at this time. Her primary cardiolgist is working on Network engineer prior records, defer decision to her primary cardiolgist   Ok for discharge from our standpoint. Please change her metolazone  to 2.5mg  qFriday and restart he home lasix starting tomorrow. I will arrange a f/u in 2 weeks in cardiology clinic.   Dina Rich, M.D.

## 2017-03-31 NOTE — Discharge Summary (Signed)
Physician Discharge Summary  [name] WJX:[id number] DOB: 16-Apr-1951 DOA: 03/30/2017  PCP: Wilson Singer, MD  Admit date: 03/30/2017 Discharge date: 03/31/2017  Admitted From: Home  Disposition:  Home  Recommendations for Outpatient Follow-up:  1. Follow up with PCP, cardiologist in 1-2 weeks 2. Please obtain BMP/CBC in one week  Home Health: No   Equipment/Devices: No  Discharge Condition: Stable CODE STATUS: Full code Diet recommendation: Heart Healthy  HPI- By Dr. Antionette Char. Chief Complaint: Lightheaded, near-syncope   HPI: Donna Graves is a 66 y.o. female with medical history significant for non-insulin-dependent diabetes mellitus, hypertension, nonobstructive coronary artery disease, and chronic systolic CHF with AICD, now presenting to the emergency department with lightheadedness and near syncope. Patient reports that she had been in her usual state of health until yesterday or the day before when she noted the insidious development of a nonspecific malaise and lightheadedness. The symptoms progressed and were more severe today. She reports feeling as though she would pass out, but never did lose consciousness. The sensation would occur both while seated, and while active. No alleviating or exacerbating factors were identified and there was no associated chest pain, palpitations, headache, change in vision or hearing, or focal numbness or weakness. Denies orthopnea and reports improvement in her chronic bilateral lower extremity swelling. The patient denies any recent fevers or chills. Of note, she saw cardiology on 03/21/2017, and was started on Zaroxolyn at that time for a 2 week course. She reports increased urination with the new diuretic. She does not weigh herself at home, but was noted to weigh 325 pounds during the cardiology visit on 03/21/2017, and weight was down to 315 pounds when she saw her PCP on 03/28/2017. ED Course: Upon arrival to the ED, patient is found to  be afebrile, saturating well on room air, and with vitals otherwise stable. EKG features and AV dual paced rhythm. Chest x-rays notable for cardiomegaly with mild diffuse pulmonary interstitial edema and AICD in place. Chemistry panel features a BUN of 26 and creatinine 1.01, up from 0.7 in June 2018. CBC is notable for polycythemia with hemoglobin of 17.1 and hematocrit 52.6. Troponin is just slightly elevated at 0.03 and BNP is elevated to 288. Urinalysis is unremarkable and d-dimer is within the normal limits. Patient was treated with meclizine and 500 mL normal saline 2 in the ED. She remained hemodynamically stable and in no apparent respiratory distress and will be observed on telemetry for ongoing evaluation and management of near syncope suspected secondary to dehydration from diuresis.  Discharge Diagnoses:  Principal Problem:   Near syncope Active Problems:   Chronic systolic CHF (congestive heart failure) (HCC)   Type II diabetes mellitus (HCC)   AF (paroxysmal atrial fibrillation) (HCC)   Essential hypertension   CAD (coronary artery disease)  Near-syncope - most likely secondary to intravascular volume depletion. Wt down 10 lbs in last week, H&H are elevated, and SCr is up after starting Zaroxolyn. CXR features mild interstitial edema and there is some peripheral edema noted on exam. She was given IV fluids and admission. She just had echo performed 2 weeks ago and described below. Cardiology was consulted, with recommendations to lower metolazone to 2.5 mg every Friday only, and restart home Lasix the day following discharge. Patient to follow-up in cardiology clinic in 2 weeks.  Chronic systolic CHF - Wt is reportedly down 10 lbs and there is no gallop or JVD; there is pretibial edema that pt reports to be improved, and  mild interstitial edema on CXR. TTE (03/17/17) with EF 25-30%, mild LVH, hypokinetic anteroseptal myocardium, mild MR, and mild TR  She has AICD in place.   CAD- no  chest pain . Troponin remained flat at 0.03. Has non-obstructive CAD by cath in November 2014. Continue Coreg and Zocor, on admission and on discharge.  Type II DM - No A1c on file. Continue glipizide on discharge  Discharge Instructions  Discharge Instructions    Diet - low sodium heart healthy    Complete by:  As directed    Discharge instructions    Complete by:  As directed    Your metolazone/Zaroxolyn dose has been changed- take 2.5mg  only on fridays.  You can resume taking your Lasix/furosemide tomorrow.   Please follow up with your primary Care provider in 1 week, and your cardiologist in 2 weeks.   Increase activity slowly    Complete by:  As directed      Allergies as of 03/31/2017      Reactions   Codeine Rash      Medication List    TAKE these medications   acidophilus Caps capsule Take 1 capsule by mouth daily. ULTIMATE FLORA PROBIOTIC   ALIVE ONCE DAILY WOMENS 50+ PO Take 1 tablet by mouth daily.   BLACK CHERRY CONCENTRATE PO Take 2 tablets by mouth daily.   carvedilol 12.5 MG tablet Commonly known as:  COREG TAKE ONE TABLET BY MOUTH TWICE DAILY   dicyclomine 10 MG capsule Commonly known as:  BENTYL Take 10 mg by mouth 3 (three) times daily as needed for spasms.   enalapril 10 MG tablet Commonly known as:  VASOTEC Take 10 mg by mouth 2 (two) times daily.   furosemide 80 MG tablet Commonly known as:  LASIX Take 2 tablets (160 mg total) by mouth 2 (two) times daily.   glimepiride 2 MG tablet Commonly known as:  AMARYL Take 2 mg by mouth daily. BEFORE A MEAL   magnesium oxide 400 MG tablet Commonly known as:  MAG-OX Take 400 mg by mouth daily.   metolazone 2.5 MG tablet Commonly known as:  ZAROXOLYN Take 2.5mg  every Friday. Start taking on:  04/07/2017 What changed:  additional instructions  These instructions start on 04/07/2017. If you are unsure what to do until then, ask your doctor or other care provider.   omeprazole 20 MG  capsule Commonly known as:  PRILOSEC Take 20 mg by mouth every morning.   polycarbophil 625 MG tablet Commonly known as:  FIBERCON Take 625 mg by mouth 4 (four) times daily.   potassium chloride SA 20 MEQ tablet Commonly known as:  K-DUR,KLOR-CON Take 20 mEq by mouth 2 (two) times daily.   RED YEAST RICE PO Take 2 tablets by mouth daily.   simvastatin 40 MG tablet Commonly known as:  ZOCOR Take 40 mg by mouth every evening.   VITAMIN D PO Take 7,000 Units by mouth daily.       Allergies  Allergen Reactions  . Codeine Rash    Consultations: Cardiology- Dr. Wyline Mood.  Procedures/Studies: Dg Chest 2 View  Result Date: 03/30/2017 CLINICAL DATA:  Initial evaluation for acute chest pain, nausea, dizziness. EXAM: CHEST  2 VIEW COMPARISON:  Prior radiograph from 02/01/2017. FINDINGS: Left-sided Raynelle Fanning transvenous pacemaker/ AICD in place. Stable cardiomegaly. Mediastinal silhouette normal. Lungs hypoinflated. Diffuse pulmonary vascular congestion with interstitial prominence, suggesting mild pulmonary interstitial edema. No focal infiltrates. No pleural effusion. No pneumothorax. No acute osseus abnormality. IMPRESSION: 1. Cardiomegaly with findings  suggestion of mild diffuse pulmonary interstitial edema. 2. Left-sided transvenous pacemaker/AICD in place. Electronically Signed   By: Rise Mu M.D.   On: 03/30/2017 22:02    Subjective: Patient is feeling somewhat better today ready to go home. On a vent full night vitals stable.  Discharge Exam: Vitals:   03/31/17 0616 03/31/17 1337  BP: (!) 140/49 105/66  Pulse: 79 71  Resp: 20 18  Temp: 98.3 F (36.8 C) 99.1 F (37.3 C)   Vitals:   03/31/17 0030 03/31/17 0103 03/31/17 0616 03/31/17 1337  BP: (!) 118/50 (!) 103/56 (!) 140/49 105/66  Pulse: 73 89 79 71  Resp: (!) 25  20 18   Temp:  98.5 F (36.9 C) 98.3 F (36.8 C) 99.1 F (37.3 C)  TempSrc:  Oral Oral Oral  SpO2: 93% 93% 93% 91%  Weight:  (!) 145.4 kg (320  lb 9.6 oz) (!) 145.1 kg (319 lb 12.8 oz)   Height:        General: Pt is alert, awake, not in acute distress Cardiovascular: RRR, S1/S2 +, no rubs, no gallops Respiratory: CTA bilaterally, no wheezing, no rhonchi Abdominal: Soft, NT, ND, bowel sounds + Extremities: Trace pitting bilateral edema  The results of significant diagnostics from this hospitalization (including imaging, microbiology, ancillary and laboratory) are listed below for reference.    Labs: BNP (last 3 results)  Recent Labs  03/09/17 1115 03/30/17 2027  BNP 305.0* 288.0*   Basic Metabolic Panel:  Recent Labs Lab 03/30/17 1946 03/31/17 0546  NA 136 136  K 3.5 3.2*  CL 90* 95*  CO2 28 32  GLUCOSE 247* 170*  BUN 26* 23*  CREATININE 1.01* 0.71  CALCIUM 9.1 8.7*   Liver Function Tests:  Recent Labs Lab 03/30/17 1946  AST 39  ALT 17  ALKPHOS 118  BILITOT 1.4*  PROT 6.6  ALBUMIN 3.9   CBC:  Recent Labs Lab 03/30/17 1946 03/31/17 0546  WBC 6.9 7.1  HGB 17.1* 16.5*  HCT 52.6* 51.9*  MCV 90.7 92.0  PLT 157 146*   Cardiac Enzymes:  Recent Labs Lab 03/30/17 2120 03/31/17 0546 03/31/17 1149  TROPONINI 0.03* 0.03* 0.03*   CBG:  Recent Labs Lab 03/30/17 2000 03/31/17 0107 03/31/17 0735 03/31/17 1116  GLUCAP 277* 199* 182* 266*   D-Dimer  Recent Labs  03/30/17 1946  DDIMER 0.37   Urinalysis    Component Value Date/Time   COLORURINE YELLOW 03/30/2017 1946   APPEARANCEUR CLEAR 03/30/2017 1946   LABSPEC 1.014 03/30/2017 1946   PHURINE 7.0 03/30/2017 1946   GLUCOSEU NEGATIVE 03/30/2017 1946   HGBUR NEGATIVE 03/30/2017 1946   BILIRUBINUR NEGATIVE 03/30/2017 1946   KETONESUR NEGATIVE 03/30/2017 1946   PROTEINUR NEGATIVE 03/30/2017 1946   NITRITE NEGATIVE 03/30/2017 1946   LEUKOCYTESUR NEGATIVE 03/30/2017 1946    Time coordinating discharge: Over 30 minutes  SIGNED:  Onnie Boer, MD  [facility] 03/31/2017, 2:40 PM Pager [phone]  If 7PM-7AM,  please contact night-coverage [email or web address] Password [email or web address]

## 2017-03-31 NOTE — Care Management Note (Signed)
Case Management Note  Patient Details  Name: Donna Graves MRN: [id number] Date of Birth: 27-Mar-1951  Subjective/Objective:                  Pt admitted with near syncope. She is from home, lives with family and is ind with ADL's. She has PCP, transportation and insurance with drug coverage. No needs communicated.   Action/Plan: Plan for DC home with self care. CM will follow to DC.  Expected Discharge Date:     04/01/2017             Expected Discharge Plan:  Home/Self Care  In-House Referral:  NA  Discharge planning Services  CM Consult  Post Acute Care Choice:  NA Choice offered to:  Patient  Status of Service:  Completed, signed off  Malcolm Metro, RN 03/31/2017, 10:12 AM

## 2017-03-31 NOTE — H&P (Signed)
History and Physical    Donna Graves XKG:[id number] DOB: Sep 29, 1950 DOA: 03/30/2017  PCP: Wilson Singer, MD   Patient coming from: Home  Chief Complaint: Lightheaded, near-syncope   HPI: SAQQARA NEMET is a 66 y.o. female with medical history significant for non-insulin-dependent diabetes mellitus, hypertension, nonobstructive coronary artery disease, and chronic systolic CHF with AICD, now presenting to the emergency department with lightheadedness and near syncope. Patient reports that she had been in her usual state of health until yesterday or the day before when she noted the insidious development of a nonspecific malaise and lightheadedness. The symptoms progressed and were more severe today. She reports feeling as though she would pass out, but never did lose consciousness. The sensation would occur both while seated, and while active. No alleviating or exacerbating factors were identified and there was no associated chest pain, palpitations, headache, change in vision or hearing, or focal numbness or weakness. Denies orthopnea and reports improvement in her chronic bilateral lower extremity swelling. The patient denies any recent fevers or chills. Of note, she saw cardiology on 03/21/2017, and was started on Zaroxolyn at that time for a 2 week course. She reports increased urination with the new diuretic. She does not weigh herself at home, but was noted to weigh 325 pounds during the cardiology visit on 03/21/2017, and weight was down to 315 pounds when she saw her PCP on 03/28/2017.  ED Course: Upon arrival to the ED, patient is found to be afebrile, saturating well on room air, and with vitals otherwise stable. EKG features and AV dual paced rhythm. Chest x-rays notable for cardiomegaly with mild diffuse pulmonary interstitial edema and AICD in place. Chemistry panel features a BUN of 26 and creatinine 1.01, up from 0.7 in June 2018. CBC is notable for polycythemia with hemoglobin of  17.1 and hematocrit 52.6. Troponin is just slightly elevated at 0.03 and BNP is elevated to 288. Urinalysis is unremarkable and d-dimer is within the normal limits. Patient was treated with meclizine and 500 mL normal saline 2 in the ED. She remained hemodynamically stable and in no apparent respiratory distress and will be observed on telemetry for ongoing evaluation and management of near syncope suspected secondary to dehydration from diuresis.  Review of Systems:  All other systems reviewed and apart from HPI, are negative.  Past Medical History:  Diagnosis Date  . CHF (congestive heart failure) (HCC)   . Depression   . Diabetes mellitus without complication (HCC)   . 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 Gout   . Hyperlipidemia   . Hypertension   . Hypothyroidism     Past Surgical History:  Procedure Laterality Date  . BACK SURGERY    . left toe surgery    . pace maker       reports that she has never smoked. She has never used smokeless tobacco. She reports that she does not drink alcohol or use drugs.  Allergies  Allergen Reactions  . Codeine Rash    Family History  Problem Relation Age of Onset  . Diabetes Mother 22  . Heart attack Mother   . Hypertension Mother   . Diabetes Brother 58  . Arthritis Father 57     Prior to Admission medications   Medication Sig Start Date End Date Taking? Authorizing Provider  acidophilus (RISAQUAD) CAPS capsule Take 1 capsule by mouth daily. ULTIMATE FLORA PROBIOTIC   Yes [provider]  carvedilol (  COREG) 12.5 MG tablet TAKE ONE TABLET BY MOUTH TWICE DAILY 03/01/17  Yes [provider]  Cholecalciferol (VITAMIN D PO) Take 7,000 Units by mouth daily.   Yes [provider]  dicyclomine (BENTYL) 10 MG capsule Take 10 mg by mouth 3 (three) times daily as needed for spasms.   Yes [provider]  enalapril (VASOTEC) 10 MG tablet Take 10 mg by mouth 2  (two) times daily.    Yes [provider]  furosemide (LASIX) 80 MG tablet Take 160 mg by mouth 2 (two) times daily.  03/07/17  Yes [provider]  glimepiride (AMARYL) 2 MG tablet Take 2 mg by mouth daily. BEFORE A MEAL   Yes [provider]  magnesium oxide (MAG-OX) 400 MG tablet Take 400 mg by mouth daily.   Yes [provider]  metolazone (ZAROXOLYN) 2.5 MG tablet Take 2.5 mg two times a week Monday and Thursday, 30 minutes before lasix 03/21/17  Yes Pricilla Riffle, MD  Misc Natural Products (BLACK CHERRY CONCENTRATE PO) Take 2 tablets by mouth daily.    Yes [provider]  Multiple Vitamins-Minerals (ALIVE ONCE DAILY WOMENS 50+ PO) Take 1 tablet by mouth daily.   Yes [provider]  omeprazole (PRILOSEC) 20 MG capsule Take 20 mg by mouth every morning.   Yes [provider]  polycarbophil (FIBERCON) 625 MG tablet Take 625 mg by mouth 4 (four) times daily.   Yes [provider]  potassium chloride SA (K-DUR,KLOR-CON) 20 MEQ tablet Take 20 mEq by mouth 2 (two) times daily.   Yes [provider]  Red Yeast Rice Extract (RED YEAST RICE PO) Take 2 tablets by mouth daily.    Yes [provider]  simvastatin (ZOCOR) 40 MG tablet Take 40 mg by mouth every evening.    Yes [provider]    Physical Exam: Vitals:   03/30/17 2230 03/30/17 2300 03/30/17 2315 03/30/17 2330  BP: 116/74 120/72  104/89  Pulse: 71 70 66 71  Resp: (!) 21 (!) 23 (!) 21 20  Temp:      TempSrc:      SpO2: 93% 93% 93% 94%  Weight:      Height:          Constitutional: No acute distress, calm, comfortable Eyes: PERTLA, lids and conjunctivae normal ENMT: Mucous membranes are moist. Posterior pharynx clear of any exudate or lesions.   Neck: normal, supple, no masses, no thyromegaly Respiratory: clear to auscultation bilaterally, no wheezing, no crackles. Normal respiratory effort.   Cardiovascular: S1 & S2 heard, regular  rate and rhythm. 1+ pretibial edema bilaterally. No significant JVD. Abdomen: No distension, no tenderness, no masses palpated. Bowel sounds normal.  Musculoskeletal: no clubbing / cyanosis. No joint deformity upper and lower extremities. Normal muscle tone.  Skin: no significant rashes, lesions, ulcers. Warm, dry, well-perfused. Neurologic: CN 2-12 grossly intact. Sensation intact, DTR normal. Strength 5/5 in all 4 limbs.  Psychiatric: Alert and oriented x 3. Calm and cooperative.     Labs on Admission: I have personally reviewed following labs and imaging studies  CBC:  Recent Labs Lab 03/30/17 1946  WBC 6.9  HGB 17.1*  HCT 52.6*  MCV 90.7  PLT 157   Basic Metabolic Panel:  Recent Labs Lab 03/30/17 1946  NA 136  K 3.5  CL 90*  CO2 28  GLUCOSE 247*  BUN 26*  CREATININE 1.01*  CALCIUM 9.1   GFR: Estimated Creatinine Clearance: 81.5 mL/min (A) (  by C-G formula based on SCr of 1.01 mg/dL (H)). Liver Function Tests:  Recent Labs Lab 03/30/17 1946  AST 39  ALT 17  ALKPHOS 118  BILITOT 1.4*  PROT 6.6  ALBUMIN 3.9   No results for input(s): LIPASE, AMYLASE in the last 168 hours. No results for input(s): AMMONIA in the last 168 hours. Coagulation Profile: No results for input(s): INR, PROTIME in the last 168 hours. Cardiac Enzymes:  Recent Labs Lab 03/30/17 2120  TROPONINI 0.03*   BNP (last 3 results) No results for input(s): PROBNP in the last 8760 hours. HbA1C: No results for input(s): HGBA1C in the last 72 hours. CBG:  Recent Labs Lab 03/30/17 2000  GLUCAP 277*   Lipid Profile: No results for input(s): CHOL, HDL, LDLCALC, TRIG, CHOLHDL, LDLDIRECT in the last 72 hours. Thyroid Function Tests: No results for input(s): TSH, T4TOTAL, FREET4, T3FREE, THYROIDAB in the last 72 hours. Anemia Panel: No results for input(s): VITAMINB12, FOLATE, FERRITIN, TIBC, IRON, RETICCTPCT in the last 72 hours. Urine analysis:    Component Value Date/Time    COLORURINE YELLOW 03/30/2017 1946   APPEARANCEUR CLEAR 03/30/2017 1946   LABSPEC 1.014 03/30/2017 1946   PHURINE 7.0 03/30/2017 1946   GLUCOSEU NEGATIVE 03/30/2017 1946   HGBUR NEGATIVE 03/30/2017 1946   BILIRUBINUR NEGATIVE 03/30/2017 1946   KETONESUR NEGATIVE 03/30/2017 1946   PROTEINUR NEGATIVE 03/30/2017 1946   NITRITE NEGATIVE 03/30/2017 1946   LEUKOCYTESUR NEGATIVE 03/30/2017 1946   Sepsis Labs: @LABRCNTIP (procalcitonin:4,lacticidven:4) )No results found for this or any previous visit (from the past 240 hour(s)).   Radiological Exams on Admission: Dg Chest 2 View  Result Date: 03/30/2017 CLINICAL DATA:  Initial evaluation for acute chest pain, nausea, dizziness. EXAM: CHEST  2 VIEW COMPARISON:  Prior radiograph from 02/01/2017. FINDINGS: Left-sided Raynelle Fanning transvenous pacemaker/ AICD in place. Stable cardiomegaly. Mediastinal silhouette normal. Lungs hypoinflated. Diffuse pulmonary vascular congestion with interstitial prominence, suggesting mild pulmonary interstitial edema. No focal infiltrates. No pleural effusion. No pneumothorax. No acute osseus abnormality. IMPRESSION: 1. Cardiomegaly with findings suggestion of mild diffuse pulmonary interstitial edema. 2. Left-sided transvenous pacemaker/AICD in place. Electronically Signed   By: Rise Mu M.D.   On: 03/30/2017 22:02    EKG: Independently reviewed. A-V dual-paced rhythm.   Assessment/Plan  1. Near-syncope  - Pt presents with 2 days mild nausea, non-specific malaise, and lightheadedness with near-syncope  - Suspect this is secondary to intravascular volume depletion  - Wt is down 10 lbs in last week, H&H are elevated, and SCr is up after starting Zaroxolyn - CXR features mild interstitial edema and there is some peripheral edema noted on exam, but no gallop or JVD and no respiratory complaints  - She was given 500 cc NS x2 in ED  - She just had echo performed 2 weeks ago and described below  - Plan to monitor on  telemetry, hold diuretics, repeat chemistries in am     2. Chronic systolic CHF  - Volume-status a little difficult to determine clinically d/t pt body habitus  - Wt is reportedly down 10 lbs and there is no gallop or JVD; there is pretibial edema that pt reports to be improved, and mild interstitial edema on CXR  - TTE (03/17/17) with EF 25-30%, mild LVH, hypokinetic anteroseptal myocardium, mild MR, and mild TR  - She has AICD in place  - She was given 500 cc NS x2 in ED  - Plan to hold her Lasix 160 BID, hold Zaroxolyn, and hold enalapril initially  -  SLIV, follow daily-wts and I/O's, continue Coreg  3. CAD, elevated troponin  - No anginal complaints  - Has non-obstructive CAD by cath in November 2014  - Initial troponin at borderline of elevation, 0.03, in ED  - Monitor on telemetry, trend troponin, continue Coreg and Zocor    4. Type II DM  - No A1c on file  - Managed at home with glimepiride only  - Check CBG with meals and qHS - Start a low-intensity Novolog correctional     DVT prophylaxis: sq Lovenox  Code Status: Full  Family Communication: Son and husband updated at bedside Disposition Plan: Observe on telemetry Consults called: None Admission status: Observation    Briscoe Deutscher, MD [facility] Pager [phone]  If 7PM-7AM, please contact night-coverage [email or web address] Password [facility]  03/31/2017, 12:26 AM

## 2017-03-31 NOTE — Care Management Obs Status (Signed)
MEDICARE OBSERVATION STATUS NOTIFICATION   Patient Details  Name: Donna Graves MRN: [id number] Date of Birth: 1951-09-15   Medicare Observation Status Notification Given:  Yes    Malcolm Metro, RN 03/31/2017, 10:11 AM

## 2017-04-01 LAB — HEMOGLOBIN A1C
Hgb A1c MFr Bld: 9 % — ABNORMAL HIGH (ref 4.8–5.6)
Mean Plasma Glucose: 212 mg/dL

## 2017-04-01 LAB — HIV ANTIBODY (ROUTINE TESTING W REFLEX): HIV Screen 4th Generation wRfx: NONREACTIVE

## 2017-04-17 ENCOUNTER — Ambulatory Visit (INDEPENDENT_AMBULATORY_CARE_PROVIDER_SITE_OTHER): Payer: Medicare PPO | Admitting: Adult Health

## 2017-04-17 ENCOUNTER — Encounter: Payer: Self-pay | Admitting: Adult Health

## 2017-04-17 VITALS — BP 110/86 | HR 94 | Ht 65.0 in | Wt 330.0 lb

## 2017-04-17 DIAGNOSIS — I10 Essential (primary) hypertension: Secondary | ICD-10-CM

## 2017-04-17 DIAGNOSIS — E7800 Pure hypercholesterolemia, unspecified: Secondary | ICD-10-CM | POA: Diagnosis not present

## 2017-04-17 DIAGNOSIS — I5022 Chronic systolic (congestive) heart failure: Secondary | ICD-10-CM

## 2017-04-17 NOTE — Patient Instructions (Signed)
Medication Instructions:  Your physician recommends that you continue on your current medications as directed. Please refer to the Current Medication list given to you today. You May Crush your Potassium   Labwork: NONE  Testing/Procedures: Your physician has requested that you have an echocardiogram. Echocardiography is a painless test that uses sound waves to create images of your heart. It provides your doctor with information about the size and shape of your heart and how well your heart's chambers and valves are working. This procedure takes approximately one hour. There are no restrictions for this procedure.    Follow-Up: Your physician recommends that you schedule a follow-up appointment in: 3 Month with Dr. Wyline Mood    Any Other Special Instructions Will Be Listed Below (If Applicable).  Thank you for choosing [facility]!     If you need a refill on your cardiac medications before your next appointment, please call your pharmacy.

## 2017-04-17 NOTE — Progress Notes (Signed)
Cardiology Office Note   Date:  04/17/2017   ID:  Donna Graves, DOB 05-Feb-1951, MRN [id number]  PCP:  Wilson Singer, MD  Cardiologist:  Tenny Craw  Chief Complaint  Patient presents with  . Congestive Heart Failure      History of Present Illness: Donna Graves is a 66 y.o. female who presents for ongoing assessment and management of systolic heart failure, followed by cardiologist Dr. Earlene Plater in [facility], and is now being followed in [place]. Was seen initially on consultation by Dr. Wyline Mood in the office on 03/09/2017. Echocardiogram was ordered for evaluation of LV function, along with the BMET. Patient also has a history of permanent pacemaker in situ ([id number]!).  Unfortunately,  the patient was admitted to the hospital on 03/31/2017, in the setting of near syncope malaise and lightheadedness. The patient was treated with meclizine and normal saline in the emergency room. She was ruled out for cardiac etiology of syncope. He was suspected that syncope was related to dehydration from diuresis.  She states she has gained 10-14 lbs since leaving hospital, most over the last week. She has stopped taking metolazone as directed, as she was dehydrated during hospitalization. She states her breathing status is worsened. She is also interested in discussing ICD implantation.   Past Medical History:  Diagnosis Date  . CHF (congestive heart failure) (HCC)   . Depression   . Diabetes mellitus without complication (HCC)   . 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 Gout   . Hyperlipidemia   . Hypertension   . Hypothyroidism     Past Surgical History:  Procedure Laterality Date  . BACK SURGERY    . left toe surgery    . pace maker       Current Outpatient Prescriptions  Medication Sig Dispense Refill  . acidophilus (RISAQUAD) CAPS capsule Take 1 capsule by mouth daily. ULTIMATE FLORA PROBIOTIC    . carvedilol (COREG) 12.5 MG tablet  TAKE ONE TABLET BY MOUTH TWICE DAILY  1  . Cholecalciferol (VITAMIN D PO) Take 7,000 Units by mouth daily.    Marland Kitchen dicyclomine (BENTYL) 10 MG capsule Take 10 mg by mouth 3 (three) times daily as needed for spasms.    . enalapril (VASOTEC) 10 MG tablet Take 10 mg by mouth 2 (two) times daily.     . furosemide (LASIX) 80 MG tablet Take 2 tablets (160 mg total) by mouth 2 (two) times daily.    Marland Kitchen glimepiride (AMARYL) 2 MG tablet Take 2 mg by mouth daily. BEFORE A MEAL    . magnesium oxide (MAG-OX) 400 MG tablet Take 400 mg by mouth daily.    . metolazone (ZAROXOLYN) 2.5 MG tablet Take 2.5mg  every Friday. 30 tablet 3  . Misc Natural Products (BLACK CHERRY CONCENTRATE PO) Take 2 tablets by mouth daily.     . Multiple Vitamins-Minerals (ALIVE ONCE DAILY WOMENS 50+ PO) Take 1 tablet by mouth daily.    Marland Kitchen omeprazole (PRILOSEC) 20 MG capsule Take 20 mg by mouth every morning.    . polycarbophil (FIBERCON) 625 MG tablet Take 625 mg by mouth 4 (four) times daily.    . potassium chloride SA (K-DUR,KLOR-CON) 20 MEQ tablet Take 20 mEq by mouth 2 (two) times daily.    . Red Yeast Rice Extract (RED YEAST RICE PO) Take 2 tablets by mouth daily.     . simvastatin (ZOCOR) 40 MG tablet Take 40 mg by mouth every evening.  No current facility-administered medications for this visit.     Allergies:   Codeine    Social History:  The patient  reports that she has never smoked. She has never used smokeless tobacco. She reports that she does not drink alcohol or use drugs.   Family History:  The patient's family history includes Arthritis (age of onset: 1) in her father; Diabetes (age of onset: 45) in her brother; Diabetes (age of onset: 72) in her mother; Heart attack in her mother; Hypertension in her mother.    ROS: All other systems are reviewed and negative. Unless otherwise mentioned in H&P    PHYSICAL EXAM: VS:  BP 110/86   Pulse 94   Ht 5\' 5"  (1.651 m)   Wt (!) 330 lb (149.7 kg)   SpO2 98%   BMI  54.91 kg/m  , BMI Body mass index is 54.91 kg/m. GEN: Well nourished, well developed, in no acute distress  HEENT: normal  Neck: no JVD, carotid bruits, or masses Cardiac: RRR; distant heart sounds, no murmurs, rubs, or gallops, 1+-2+ pitting lower extremity edema  Respiratory: Clear to auscultation bilaterally, normal work of breathing GI: soft, nontender, nondistended, + BS MS: no deformity or atrophy  Skin: warm and dry, no rash Neuro:  Strength and sensation are intact Psych: euthymic mood, full affect   Recent Labs: 03/30/2017: ALT 17; B Natriuretic Peptide 288.0 03/31/2017: BUN 23; Creatinine, Ser 0.71; Hemoglobin 16.5; Platelets 146; Potassium 3.2; Sodium 136    Lipid Panel No results found for: CHOL, TRIG, HDL, CHOLHDL, VLDL, LDLCALC, LDLDIRECT    Wt Readings from Last 3 Encounters:  04/17/17 (!) 330 lb (149.7 kg)  03/31/17 (!) 319 lb 12.8 oz (145.1 kg)  03/09/17 (!) 325 lb (147.4 kg)      Other studies Reviewed: Echocardiogram 2017/03/16 Left ventricle: The cavity size was at the upper limits of   normal. Wall thickness was increased in a pattern of mild LVH.   Systolic function was severely reduced. The estimated ejection   fraction was in the range of 25% to 30%. There is hypokinesis of   the anteroseptal myocardium. The study is not technically   sufficient to allow evaluation of LV diastolic function. - Ventricular septum: Septal motion showed abnormal function and   dyssynergy. - Aortic valve: Mildly calcified annulus. Trileaflet. - Mitral valve: Calcified annulus. Mildly thickened leaflets .   There was mild regurgitation. - Left atrium: The atrium was mildly dilated. - Right ventricle: The cavity size was mildly dilated. Systolic   function was mildly reduced. - Right atrium: Pacer wire or catheter noted in right atrium. - Atrial septum: No defect or patent foramen ovale was identified. - Tricuspid valve: There was mild regurgitation. - Pulmonary  arteries: PA peak pressure: 29 mm Hg (S). - Pericardium, extracardiac: There was no pericardial effusion.   ASSESSMENT AND PLAN:  1. Chronic Systolic CHF: She admits to some dietary non-compliance. She has not taken metolazone since leaving hospital per instructions and has gained 10-14 lbs depending on the scale. I have advised her to take one dose of the metolazone 2.5 mg today, and continue lasix as directed. She does not need to take the metolazone weekly as in the past, but when she gains weight > 5 lbs. She can also crush potassium instead of trying to swallow the pill, as this is difficult for her to do.    I will repeat echocardiogram. She may need to be referred to Dr. Ladona Ridgel for discussion of  ICD implantation if EF remains decreased on optimal medical therapy. She is low normal on BP and therefore will not start Entresto at this time.   2. Hypertension: She will continue ACE, carvedilol as she is taking for systolic dysfunction, . BP is low but optimal for reduced EF.   3. Hypercholesterolemia: Continue statin therapy.   4. PPM in situ: Is due to see Dr. Ladona Ridgel on August 30 th for PPM check. Continue per protocol.    Current medicines are reviewed at length with the patient today.    Labs/ tests ordered today include:  Bettey Mare. Liborio Nixon, ANP, AACC   04/17/2017 2:38 PM    [facility] [address], [place], [place] [address] Phone: [phone]; Fax: [phone]

## 2017-04-19 ENCOUNTER — Ambulatory Visit (HOSPITAL_COMMUNITY)
Admission: RE | Admit: 2017-04-19 | Discharge: 2017-04-19 | Disposition: A | Discharge status: Home / Self Care | Payer: Medicare PPO | Source: Ambulatory Visit | Attending: Adult Health | Admitting: Adult Health

## 2017-04-19 DIAGNOSIS — I5022 Chronic systolic (congestive) heart failure: Secondary | ICD-10-CM | POA: Diagnosis not present

## 2017-04-19 DIAGNOSIS — I080 Rheumatic disorders of both mitral and aortic valves: Secondary | ICD-10-CM | POA: Diagnosis not present

## 2017-04-19 DIAGNOSIS — I420 Dilated cardiomyopathy: Secondary | ICD-10-CM | POA: Diagnosis not present

## 2017-04-19 LAB — ECHOCARDIOGRAM COMPLETE
AVLVOTPG: 3 mmHg
CHL CUP MV DEC (S): 197
CHL CUP RV SYS PRESS: 27 mmHg
EWDT: 197 ms
FS: 13 % — AB (ref 28–44)
IVS/LV PW RATIO, ED: 1.07
LA vol A4C: 86 ml
LA vol index: 34.3 mL/m2
LADIAMINDEX: 1.8 cm/m2
LASIZE: 49 mm
LAVOL: 93.2 mL
LEFT ATRIUM END SYS DIAM: 49 mm
LV SIMPSON'S DISK: 37
LV dias vol index: 41 mL/m2
LV dias vol: 110 mL — AB (ref 46–106)
LV sys vol index: 26 mL/m2
LV sys vol: 70 mL — AB (ref 14–42)
LVOT VTI: 16.8 cm
LVOT area: 2.27 cm2
LVOT diameter: 17 mm
LVOTPV: 86.2 cm/s
LVOTSV: 38 mL
MV Peak grad: 3 mmHg
MV VTI: 153 cm
MVPKEVEL: 80.9 m/s
PW: 10.7 mm — AB (ref 0.6–1.1)
RV TAPSE: 19.4 mm
Reg peak vel: 175 cm/s
Stroke v: 40 ml
TRMAXVEL: 175 cm/s

## 2017-04-19 NOTE — Progress Notes (Signed)
*  PRELIMINARY RESULTS* Echocardiogram 2D Echocardiogram has been performed.  Stacey Drain 04/19/2017, 10:42 AM

## 2017-04-24 ENCOUNTER — Telehealth: Payer: Self-pay | Admitting: Cardiology

## 2017-04-24 NOTE — Telephone Encounter (Signed)
Patient informed and verbalized understanding of plan. 

## 2017-04-24 NOTE — Telephone Encounter (Signed)
Result Notes   Notes recorded by Nori Riis, RN on 04/20/2017 at 9:19 AM EDT Forwarded to pcp ------  Notes recorded by Jodelle Gross, NP on 04/20/2017 at 7:05 AM EDT EF remains reduced. She has been referred to Dr. Ladona Ridgel of ICD placement evaluation.

## 2017-04-24 NOTE — Telephone Encounter (Signed)
Results of Echo / tg  °

## 2017-05-18 ENCOUNTER — Ambulatory Visit (INDEPENDENT_AMBULATORY_CARE_PROVIDER_SITE_OTHER): Payer: Medicare PPO | Admitting: Internal Medicine

## 2017-05-18 ENCOUNTER — Encounter: Payer: Self-pay | Admitting: *Deleted

## 2017-05-18 ENCOUNTER — Encounter: Payer: Self-pay | Admitting: Internal Medicine

## 2017-05-18 ENCOUNTER — Other Ambulatory Visit (HOSPITAL_COMMUNITY)
Admission: RE | Admit: 2017-05-18 | Discharge: 2017-05-18 | Disposition: A | Discharge status: Home / Self Care | Payer: Medicare PPO | Source: Ambulatory Visit | Attending: Internal Medicine | Admitting: Internal Medicine

## 2017-05-18 ENCOUNTER — Ambulatory Visit (INDEPENDENT_AMBULATORY_CARE_PROVIDER_SITE_OTHER): Payer: Medicare PPO | Admitting: *Deleted

## 2017-05-18 DIAGNOSIS — I5022 Chronic systolic (congestive) heart failure: Secondary | ICD-10-CM

## 2017-05-18 DIAGNOSIS — I10 Essential (primary) hypertension: Secondary | ICD-10-CM

## 2017-05-18 DIAGNOSIS — I480 Paroxysmal atrial fibrillation: Secondary | ICD-10-CM | POA: Diagnosis not present

## 2017-05-18 DIAGNOSIS — I442 Atrioventricular block, complete: Secondary | ICD-10-CM

## 2017-05-18 LAB — CUP PACEART INCLINIC DEVICE CHECK
Battery Voltage: 2.97 V
Brady Statistic AP VP Percent: 96.88 %
Brady Statistic AP VS Percent: 0.1 %
Brady Statistic AS VP Percent: 2.99 %
Brady Statistic RA Percent Paced: 96.32 %
Brady Statistic RV Percent Paced: 99.17 %
Date Time Interrogation Session: 20180830125511
Implantable Lead Location: 753860
Implantable Lead Model: 5076
Implantable Lead Model: 5076
Implantable Pulse Generator Implant Date: 20141223
Lead Channel Impedance Value: 380 Ohm
Lead Channel Impedance Value: 399 Ohm
Lead Channel Pacing Threshold Amplitude: 1.25 V
Lead Channel Pacing Threshold Pulse Width: 0.4 ms
Lead Channel Pacing Threshold Pulse Width: 0.4 ms
Lead Channel Sensing Intrinsic Amplitude: 8.25 mV
Lead Channel Sensing Intrinsic Amplitude: 8.25 mV
Lead Channel Setting Pacing Amplitude: 2 V
Lead Channel Setting Pacing Amplitude: 2.5 V
Lead Channel Setting Pacing Pulse Width: 0.8 ms
Lead Channel Setting Sensing Sensitivity: 0.9 mV
MDC IDC LEAD IMPLANT DT: 20050121
MDC IDC LEAD IMPLANT DT: 20141223
MDC IDC LEAD LOCATION: 753859
MDC IDC MSMT BATTERY REMAINING LONGEVITY: 34 mo
MDC IDC MSMT LEADCHNL RA IMPEDANCE VALUE: 475 Ohm
MDC IDC MSMT LEADCHNL RA IMPEDANCE VALUE: 589 Ohm
MDC IDC MSMT LEADCHNL RA SENSING INTR AMPL: 0.625 mV
MDC IDC MSMT LEADCHNL RA SENSING INTR AMPL: 0.75 mV
MDC IDC MSMT LEADCHNL RV PACING THRESHOLD AMPLITUDE: 1.625 V
MDC IDC STAT BRADY AS VS PERCENT: 0.03 %

## 2017-05-18 LAB — CBC WITH DIFFERENTIAL/PLATELET
BASOS ABS: 0 10*3/uL (ref 0.0–0.1)
BASOS PCT: 0 %
Eosinophils Absolute: 0.1 10*3/uL (ref 0.0–0.7)
Eosinophils Relative: 2 %
HEMATOCRIT: 52.4 % — AB (ref 36.0–46.0)
HEMOGLOBIN: 17.1 g/dL — AB (ref 12.0–15.0)
Lymphocytes Relative: 21 %
Lymphs Abs: 1.3 10*3/uL (ref 0.7–4.0)
MCH: 30 pg (ref 26.0–34.0)
MCHC: 32.6 g/dL (ref 30.0–36.0)
MCV: 91.9 fL (ref 78.0–100.0)
MONOS PCT: 8 %
Monocytes Absolute: 0.5 10*3/uL (ref 0.1–1.0)
NEUTROS ABS: 4.2 10*3/uL (ref 1.7–7.7)
NEUTROS PCT: 69 %
Platelets: 144 10*3/uL — ABNORMAL LOW (ref 150–400)
RBC: 5.7 MIL/uL — ABNORMAL HIGH (ref 3.87–5.11)
RDW: 15.2 % (ref 11.5–15.5)
WBC: 6.2 10*3/uL (ref 4.0–10.5)

## 2017-05-18 LAB — BASIC METABOLIC PANEL
ANION GAP: 13 (ref 5–15)
BUN: 20 mg/dL (ref 6–20)
CALCIUM: 8.9 mg/dL (ref 8.9–10.3)
CO2: 27 mmol/L (ref 22–32)
Chloride: 96 mmol/L — ABNORMAL LOW (ref 101–111)
Creatinine, Ser: 0.84 mg/dL (ref 0.44–1.00)
Glucose, Bld: 217 mg/dL — ABNORMAL HIGH (ref 65–99)
POTASSIUM: 3.6 mmol/L (ref 3.5–5.1)
Sodium: 136 mmol/L (ref 135–145)

## 2017-05-18 LAB — PROTIME-INR
INR: 1.22
PROTHROMBIN TIME: 15.3 s — AB (ref 11.4–15.2)

## 2017-05-18 NOTE — Progress Notes (Signed)
Pacemaker check in clinic w/Dr.Taylor. Normal device function. Thresholds, sensing, impedances consistent with previous measurements. Device programmed to maximize longevity. No mode switches recorded. (2) VT-NS episodes noted, max dur. 15bts. Device programmed at appropriate safety margins. RV output reprogrammed to 2.5V @0 .53ms. Histogram distribution appropriate for patient activity level. Device programmed to optimize intrinsic conduction. Estimated longevity 2.5 years. CRT-D upgrade to be scheduled within 2-3 weeks.

## 2017-05-18 NOTE — Progress Notes (Signed)
HPI Donna Graves is referred today by Joni Reining, NP, for evaluation of left ventricular dysfunction. She is a morbidly obese 66 year old woman with a long-standing history of atrial fibrillation and flutter, status post multiple ablations in [place]. Ultimately she underwent AV node ablation and pacemaker insertion. She is already had one generator change out. The patient developed worsening heart failure symptoms and was initially seen with an ejection fraction of 25%. She has been placed on maximal medical therapy, and repeat echo demonstrated persistence of her left ventricular dysfunction. She is referred now to consider upgrade to a biventricular ICD. The patient is also bothered by morbid obesity and has had a difficult time losing weight for the last 40 years.she has never had syncope. She has chronic peripheral edema. She admits to being sedentary. She denies anginal symptoms.her medical therapy is as noted above. Allergies  Allergen Reactions  . Codeine Rash     Current Outpatient Prescriptions  Medication Sig Dispense Refill  . acidophilus (RISAQUAD) CAPS capsule Take 1 capsule by mouth daily. ULTIMATE FLORA PROBIOTIC    . carvedilol (COREG) 12.5 MG tablet TAKE ONE TABLET BY MOUTH TWICE DAILY  1  . Cholecalciferol (VITAMIN D PO) Take 7,000 Units by mouth daily.    Marland Kitchen dicyclomine (BENTYL) 10 MG capsule Take 10 mg by mouth 3 (three) times daily as needed for spasms.    . enalapril (VASOTEC) 10 MG tablet Take 10 mg by mouth 2 (two) times daily.     . furosemide (LASIX) 80 MG tablet Take 2 tablets (160 mg total) by mouth 2 (two) times daily.    Marland Kitchen glimepiride (AMARYL) 2 MG tablet Take 2 mg by mouth daily. BEFORE A MEAL    . magnesium oxide (MAG-OX) 400 MG tablet Take 400 mg by mouth daily.    . metolazone (ZAROXOLYN) 2.5 MG tablet Take 2.5mg  every Friday. 30 tablet 3  . Misc Natural Products (BLACK CHERRY CONCENTRATE PO) Take 2 tablets by mouth daily.     .  Multiple Vitamins-Minerals (ALIVE ONCE DAILY WOMENS 50+ PO) Take 1 tablet by mouth daily.    Marland Kitchen omeprazole (PRILOSEC) 20 MG capsule Take 20 mg by mouth every morning.    . polycarbophil (FIBERCON) 625 MG tablet Take 625 mg by mouth 4 (four) times daily.    . potassium chloride SA (K-DUR,KLOR-CON) 20 MEQ tablet Take 20 mEq by mouth 2 (two) times daily.    . Red Yeast Rice Extract (RED YEAST RICE PO) Take 2 tablets by mouth daily.     . simvastatin (ZOCOR) 40 MG tablet Take 40 mg by mouth every evening.      No current facility-administered medications for this visit.      Past Medical History:  Diagnosis Date  . CHF (congestive heart failure) (HCC)   . Depression   . Diabetes mellitus without complication (HCC)   . 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 Gout   . Hyperlipidemia   . Hypertension   . Hypothyroidism     ROS:   All systems reviewed and negative except as noted in the HPI.   Past Surgical History:  Procedure Laterality Date  . BACK SURGERY    . left toe surgery    . pace maker       Family History  Problem Relation Age of Onset  . Diabetes Mother 64  . Heart attack Mother   . Hypertension Mother   .  Diabetes Brother 58  . Arthritis Father 41     Social History   Social History  . Marital status: Married    Spouse name: N/A  . Number of children: N/A  . Years of education: N/A   Occupational History  . Not on file.   Social History Main Topics  . Smoking status: Never Smoker  . Smokeless tobacco: Never Used  . Alcohol use No  . Drug use: No  . Sexual activity: Not on file   Other Topics Concern  . Not on file   Social History Narrative  . No narrative on file     BP 106/70   Pulse 66   Ht 5\' 4"  (1.626 m)   Wt (!) 335 lb (152 kg)   SpO2 97%   BMI 57.50 kg/m   Physical Exam:  Well appearing 66 year old woman,NAD HEENT: Unremarkable Neck:  6 cm JVD, no thyromegally Lymphatics:  No  adenopathy Back:  No CVA tenderness Lungs:  Clear, with no wheezes, rales, or rhonchi HEART:  Regular rate rhythm, no murmurs, no rubs, no clicks Abd:  soft, positive bowel sounds, no organomegally, no rebound, no guarding Ext:  2 plus pulses, no edema, no cyanosis, no clubbing Skin:  No rashes no nodules Neuro:  CN II through XII intact, motor grossly intact  EKG Sinus rhythm with ventricular pacing, and pacing induced left bundle branch block  DEVICE  Normal device function.  See PaceArt for details.   Assess/Plan: 1. Chronic systolic heart failure - her ejection fraction is 25% despite maximal medical therapy. She has pacing induced left bundle branch block. I discussed the treatment options with the patient. Biventricular pacemaker versus biventricular ICD insertion were both discussed. The pros and cons of both approaches were reviewed and she would like to proceed with biventricu insertion. 2. Morbid obesity - the patient is well over 100 pounds overweight. I've explained to her that the procedure that we are planning to help her heart beat better will not make her any better if she does not lose weight. I also discussed bariatric surgery with the patient. This would be a consideration, particularly if her pumping function improves with biventricular pacing. 3. Paroxysmal atrial arrhythmias - she will continue her current medical therapy. Pacemaker interrogation does not demonstrate any atrial fibrillation or flutter. It is unclear as to why she is not on systemic anticoagulation. We will attempt to obtain additional medical records. 4.hypertension - her blood pressure is now well-controlled. She has no room to up titrate her medical therapy because of relatively low blood pressure.  Lewayne Bunting, M.D.

## 2017-05-18 NOTE — Patient Instructions (Signed)
Medication Instructions:  Your physician recommends that you continue on your current medications as directed. Please refer to the Current Medication list given to you today.   Labwork: Your physician recommends that you return for lab work in: Today    Testing/Procedures: NONE  Follow-Up: To Be Determined     Any Other Special Instructions Will Be Listed Below (If Applicable).     If you need a refill on your cardiac medications before your next appointment, please call your pharmacy.  Thank you for choosing [facility]!

## 2017-05-23 ENCOUNTER — Ambulatory Visit (HOSPITAL_COMMUNITY)
Admission: RE | Admit: 2017-05-23 | Discharge: 2017-05-24 | Disposition: A | Discharge status: Home / Self Care | Payer: Medicare Other | Source: Ambulatory Visit | Attending: Internal Medicine | Admitting: Internal Medicine

## 2017-05-23 ENCOUNTER — Ambulatory Visit (HOSPITAL_COMMUNITY)
Admission: RE | Disposition: A | Discharge status: Home / Self Care | Payer: Self-pay | Source: Ambulatory Visit | Attending: Internal Medicine

## 2017-05-23 ENCOUNTER — Encounter (HOSPITAL_COMMUNITY): Payer: Self-pay | Admitting: General Practice

## 2017-05-23 DIAGNOSIS — I429 Cardiomyopathy, unspecified: Secondary | ICD-10-CM | POA: Diagnosis not present

## 2017-05-23 DIAGNOSIS — Z006 Encounter for examination for normal comparison and control in clinical research program: Secondary | ICD-10-CM | POA: Diagnosis not present

## 2017-05-23 DIAGNOSIS — E039 Hypothyroidism, unspecified: Secondary | ICD-10-CM | POA: Diagnosis not present

## 2017-05-23 DIAGNOSIS — I447 Left bundle-branch block, unspecified: Secondary | ICD-10-CM | POA: Diagnosis not present

## 2017-05-23 DIAGNOSIS — I110 Hypertensive heart disease with heart failure: Secondary | ICD-10-CM | POA: Insufficient documentation

## 2017-05-23 DIAGNOSIS — I5023 Acute on chronic systolic (congestive) heart failure: Secondary | ICD-10-CM | POA: Diagnosis present

## 2017-05-23 DIAGNOSIS — F329 Major depressive disorder, single episode, unspecified: Secondary | ICD-10-CM | POA: Insufficient documentation

## 2017-05-23 DIAGNOSIS — I5022 Chronic systolic (congestive) heart failure: Secondary | ICD-10-CM | POA: Insufficient documentation

## 2017-05-23 DIAGNOSIS — E119 Type 2 diabetes mellitus without complications: Secondary | ICD-10-CM | POA: Diagnosis not present

## 2017-05-23 DIAGNOSIS — I442 Atrioventricular block, complete: Secondary | ICD-10-CM | POA: Insufficient documentation

## 2017-05-23 DIAGNOSIS — I428 Other cardiomyopathies: Secondary | ICD-10-CM | POA: Diagnosis not present

## 2017-05-23 DIAGNOSIS — E785 Hyperlipidemia, unspecified: Secondary | ICD-10-CM | POA: Insufficient documentation

## 2017-05-23 DIAGNOSIS — Z7984 Long term (current) use of oral hypoglycemic drugs: Secondary | ICD-10-CM | POA: Diagnosis not present

## 2017-05-23 DIAGNOSIS — Z95810 Presence of automatic (implantable) cardiac defibrillator: Secondary | ICD-10-CM

## 2017-05-23 DIAGNOSIS — Z6841 Body Mass Index (BMI) 40.0 and over, adult: Secondary | ICD-10-CM | POA: Diagnosis not present

## 2017-05-23 DIAGNOSIS — M109 Gout, unspecified: Secondary | ICD-10-CM | POA: Insufficient documentation

## 2017-05-23 HISTORY — PX: BIV UPGRADE: EP1202

## 2017-05-23 HISTORY — PX: ICD IMPLANT: EP1208

## 2017-05-23 HISTORY — DX: Gastro-esophageal reflux disease without esophagitis: K21.9

## 2017-05-23 LAB — GLUCOSE, CAPILLARY
Glucose-Capillary: 157 mg/dL — ABNORMAL HIGH (ref 65–99)
Glucose-Capillary: 138 mg/dL — ABNORMAL HIGH (ref 65–99)
Glucose-Capillary: 191 mg/dL — ABNORMAL HIGH (ref 65–99)
Glucose-Capillary: 220 mg/dL — ABNORMAL HIGH (ref 65–99)

## 2017-05-23 LAB — SURGICAL PCR SCREEN
MRSA, PCR: NEGATIVE
Staphylococcus aureus: NEGATIVE

## 2017-05-23 SURGERY — BIV UPGRADE

## 2017-05-23 MED ORDER — IOPAMIDOL (ISOVUE-370) INJECTION 76%
INTRAVENOUS | Status: DC | PRN
  Administered 2017-05-23: 7 mL via INTRA_ARTERIAL

## 2017-05-23 MED ORDER — MIDAZOLAM HCL 5 MG/5ML IJ SOLN
INTRAMUSCULAR | Status: AC
  Filled 2017-05-23: qty 5

## 2017-05-23 MED ORDER — ENALAPRIL MALEATE 10 MG PO TABS
10.0000 mg | ORAL_TABLET | Freq: Two times a day (BID) | ORAL | Status: DC
  Administered 2017-05-23 – 2017-05-24 (×2): 10 mg via ORAL
  Filled 2017-05-23 (×2): qty 1

## 2017-05-23 MED ORDER — GLIMEPIRIDE 4 MG PO TABS
4.0000 mg | ORAL_TABLET | Freq: Every day | ORAL | Status: DC
  Administered 2017-05-24: 07:00:00 4 mg via ORAL
  Filled 2017-05-23: qty 1

## 2017-05-23 MED ORDER — CHLORHEXIDINE GLUCONATE 4 % EX LIQD: 60.0000 mL | Freq: Once | CUTANEOUS | Status: DC

## 2017-05-23 MED ORDER — HEPARIN (PORCINE) IN NACL 2-0.9 UNIT/ML-% IJ SOLN
INTRAMUSCULAR | Status: AC
  Filled 2017-05-23: qty 500

## 2017-05-23 MED ORDER — MIDAZOLAM HCL 5 MG/5ML IJ SOLN
INTRAMUSCULAR | Status: DC | PRN
  Administered 2017-05-23 (×11): 1 mg via INTRAVENOUS

## 2017-05-23 MED ORDER — IOPAMIDOL (ISOVUE-370) INJECTION 76%
INTRAVENOUS | Status: AC
  Filled 2017-05-23: qty 50

## 2017-05-23 MED ORDER — FENTANYL CITRATE (PF) 100 MCG/2ML IJ SOLN
INTRAMUSCULAR | Status: AC
  Filled 2017-05-23: qty 2

## 2017-05-23 MED ORDER — ACETAMINOPHEN 325 MG PO TABS
325.0000 mg | ORAL_TABLET | ORAL | Status: DC | PRN
  Administered 2017-05-23 – 2017-05-24 (×3): 650 mg via ORAL
  Filled 2017-05-23 (×3): qty 2

## 2017-05-23 MED ORDER — CARVEDILOL 3.125 MG PO TABS
6.2500 mg | ORAL_TABLET | Freq: Two times a day (BID) | ORAL | Status: DC
  Administered 2017-05-23 – 2017-05-24 (×2): 6.25 mg via ORAL
  Filled 2017-05-23 (×2): qty 2

## 2017-05-23 MED ORDER — HEPARIN (PORCINE) IN NACL 2-0.9 UNIT/ML-% IJ SOLN
INTRAMUSCULAR | Status: AC | PRN
  Administered 2017-05-23: 500 mL

## 2017-05-23 MED ORDER — SIMVASTATIN 20 MG PO TABS
40.0000 mg | ORAL_TABLET | Freq: Every evening | ORAL | Status: DC
  Administered 2017-05-23: 40 mg via ORAL
  Filled 2017-05-23: qty 2

## 2017-05-23 MED ORDER — SODIUM CHLORIDE 0.9 % IV SOLN
INTRAVENOUS | Status: DC
  Administered 2017-05-23: 07:00:00 via INTRAVENOUS

## 2017-05-23 MED ORDER — DEXTROSE 5 % IV SOLN
3.0000 g | INTRAVENOUS | Status: AC
  Administered 2017-05-23: 3 g via INTRAVENOUS
  Filled 2017-05-23: qty 3000

## 2017-05-23 MED ORDER — CEFAZOLIN SODIUM-DEXTROSE 2-4 GM/100ML-% IV SOLN
2.0000 g | Freq: Four times a day (QID) | INTRAVENOUS | Status: AC
  Administered 2017-05-23 – 2017-05-24 (×3): 2 g via INTRAVENOUS
  Filled 2017-05-23 (×3): qty 100

## 2017-05-23 MED ORDER — ONDANSETRON HCL 4 MG/2ML IJ SOLN
4.0000 mg | Freq: Four times a day (QID) | INTRAMUSCULAR | Status: DC | PRN
  Filled 2017-05-23: qty 2

## 2017-05-23 MED ORDER — SODIUM CHLORIDE 0.9 % IR SOLN
80.0000 mg | Status: AC
  Administered 2017-05-23: 80 mg

## 2017-05-23 MED ORDER — ONDANSETRON HCL 4 MG PO TABS: 4.0000 mg | ORAL_TABLET | Freq: Three times a day (TID) | ORAL | Status: DC | PRN

## 2017-05-23 MED ORDER — FUROSEMIDE 80 MG PO TABS
160.0000 mg | ORAL_TABLET | Freq: Two times a day (BID) | ORAL | Status: DC
  Administered 2017-05-23 – 2017-05-24 (×2): 160 mg via ORAL
  Filled 2017-05-23 (×2): qty 2

## 2017-05-23 MED ORDER — MUPIROCIN 2 % EX OINT
TOPICAL_OINTMENT | Freq: Two times a day (BID) | CUTANEOUS | Status: DC
  Administered 2017-05-23: 1 via NASAL

## 2017-05-23 MED ORDER — INSULIN ASPART 100 UNIT/ML ~~LOC~~ SOLN
0.0000 [IU] | Freq: Three times a day (TID) | SUBCUTANEOUS | Status: DC
  Administered 2017-05-23: 18:00:00 3 [IU] via SUBCUTANEOUS

## 2017-05-23 MED ORDER — IOPAMIDOL (ISOVUE-370) INJECTION 76%
INTRAVENOUS | Status: DC | PRN
  Administered 2017-05-23: 7 mL via INTRAVENOUS
  Administered 2017-05-23: 15 mL via INTRAVENOUS
  Administered 2017-05-23 (×2): 5 mL via INTRAVENOUS

## 2017-05-23 MED ORDER — IOHEXOL 350 MG/ML SOLN: INTRAVENOUS | Status: DC | PRN

## 2017-05-23 MED ORDER — RISAQUAD PO CAPS
1.0000 | ORAL_CAPSULE | Freq: Every day | ORAL | Status: DC
Start: 2017-05-23 — End: 2017-05-24
  Administered 2017-05-23 – 2017-05-24 (×2): 1 via ORAL
  Filled 2017-05-23 (×2): qty 1

## 2017-05-23 MED ORDER — CALCIUM POLYCARBOPHIL 625 MG PO TABS
625.0000 mg | ORAL_TABLET | Freq: Four times a day (QID) | ORAL | Status: DC
  Administered 2017-05-23 – 2017-05-24 (×4): 625 mg via ORAL
  Filled 2017-05-23 (×5): qty 1

## 2017-05-23 MED ORDER — FENTANYL CITRATE (PF) 100 MCG/2ML IJ SOLN
INTRAMUSCULAR | Status: DC | PRN
  Administered 2017-05-23 (×8): 12.5 ug via INTRAVENOUS
  Administered 2017-05-23: 25 ug via INTRAVENOUS
  Administered 2017-05-23 (×3): 12.5 ug via INTRAVENOUS

## 2017-05-23 MED ORDER — YOU HAVE A PACEMAKER BOOK
Freq: Once | Status: AC
  Administered 2017-05-23: 14:00:00
  Filled 2017-05-23: qty 1

## 2017-05-23 MED ORDER — POTASSIUM CHLORIDE CRYS ER 20 MEQ PO TBCR
20.0000 meq | EXTENDED_RELEASE_TABLET | Freq: Two times a day (BID) | ORAL | Status: DC
  Administered 2017-05-23 – 2017-05-24 (×3): 20 meq via ORAL
  Filled 2017-05-23 (×3): qty 1

## 2017-05-23 MED ORDER — MAGNESIUM OXIDE 400 (241.3 MG) MG PO TABS
400.0000 mg | ORAL_TABLET | Freq: Every day | ORAL | Status: DC
  Administered 2017-05-23 – 2017-05-24 (×2): 400 mg via ORAL
  Filled 2017-05-23 (×2): qty 1

## 2017-05-23 MED ORDER — OFF THE BEAT BOOK
Freq: Once | Status: AC
  Administered 2017-05-23: 14:00:00
  Filled 2017-05-23: qty 1

## 2017-05-23 MED ORDER — LIDOCAINE HCL (PF) 1 % IJ SOLN
INTRAMUSCULAR | Status: DC | PRN
  Administered 2017-05-23: 45 mL via INTRADERMAL

## 2017-05-23 MED ORDER — LIDOCAINE HCL (PF) 1 % IJ SOLN
INTRAMUSCULAR | Status: AC
  Filled 2017-05-23: qty 60

## 2017-05-23 MED ORDER — DICYCLOMINE HCL 10 MG PO CAPS: 10.0000 mg | ORAL_CAPSULE | Freq: Three times a day (TID) | ORAL | Status: DC | PRN

## 2017-05-23 MED ORDER — SODIUM CHLORIDE 0.9 % IR SOLN
Status: AC
  Filled 2017-05-23: qty 2

## 2017-05-23 SURGICAL SUPPLY — 21 items
ADAPTER SEALING SSA-EW-09 (MISCELLANEOUS) ×6 IMPLANT
BALLN ATTAIN 80 (BALLOONS) ×2
BALLN ATTAIN 80CM 6215 (BALLOONS) ×1
BALLOON ATTAIN 80 (BALLOONS) ×1 IMPLANT
CABLE SURGICAL S-101-97-12 (CABLE) ×3 IMPLANT
CATH ATTAIN COM SUR 6250V-MB2X (CATHETERS) ×3 IMPLANT
CATH ATTAIN SEL SURV 6248V-130 (CATHETERS) ×3 IMPLANT
CATH ATTAIN SEL SURV 6248V-90 (CATHETERS) ×3 IMPLANT
CATH HEX JOSEPH 2-5-2 65CM 6F (CATHETERS) ×3 IMPLANT
GUIDEWIRE ANGLED .035X150CM (WIRE) ×6 IMPLANT
HOVERMATT SINGLE USE (MISCELLANEOUS) ×3 IMPLANT
ICD CLARIA MRI DTMA1D1 (ICD Generator) ×3 IMPLANT
KIT ESSENTIALS PG (KITS) ×3 IMPLANT
LEAD SPRINT QUAT SEC 6935-58CM (Lead) ×3 IMPLANT
PAD DEFIB LIFELINK (PAD) ×3 IMPLANT
SHEATH CLASSIC 9.5F (SHEATH) ×3 IMPLANT
SHEATH CLASSIC 9F (SHEATH) ×3 IMPLANT
TRAY PACEMAKER INSERTION (PACKS) ×3 IMPLANT
WIRE ACUITY WHISPER EDS 4648 (WIRE) ×3 IMPLANT
WIRE LUGE 182CM (WIRE) ×3 IMPLANT
WIRE MAILMAN 182CM (WIRE) ×3 IMPLANT

## 2017-05-23 NOTE — Discharge Instructions (Signed)
° ° °  Supplemental Discharge Instructions for  Pacemaker/Defibrillator Patients  Activity No heavy lifting or vigorous activity with your left/right arm for 6 to 8 weeks.  Do not raise your left/right arm above your head for one week.  Gradually raise your affected arm as drawn below.           __      05/27/17                       05/28/17                      05/29/17                  05/30/17  NO DRIVING for 1 week    ; you may begin driving on  2/59/56   .  WOUND CARE - Keep the wound area clean and dry.  Do not get this area wet for one week. No showers for one week; you may shower on  05/30/17   . - The tape/steri-strips on your wound will fall off; do not pull them off.  No bandage is needed on the site.  DO  NOT apply any creams, oils, or ointments to the wound area. - If you notice any drainage or discharge from the wound, any swelling or bruising at the site, or you develop a fever > 101? F after you are discharged home, call the office at once.  Special Instructions - You are still able to use cellular telephones; use the ear opposite the side where you have your pacemaker/defibrillator.  Avoid carrying your cellular phone near your device. - When traveling through airports, show security personnel your identification card to avoid being screened in the metal detectors.  Ask the security personnel to use the hand wand. - Avoid arc welding equipment, MRI testing (magnetic resonance imaging), TENS units (transcutaneous nerve stimulators).  Call the office for questions about other devices. - Avoid electrical appliances that are in poor condition or are not properly grounded. - Microwave ovens are safe to be near or to operate.  Additional information for defibrillator patients should your device go off: - If your device goes off ONCE and you feel fine afterward, notify the device clinic nurses. - If your device goes off ONCE and you do not feel well afterward, call 911. - If your device  goes off TWICE, call 911. - If your device goes off THREE times in one day, call 911.  DO NOT DRIVE YOURSELF OR A FAMILY MEMBER WITH A DEFIBRILLATOR TO THE HOSPITAL--CALL 911.

## 2017-05-23 NOTE — Interval H&P Note (Signed)
History and Physical Interval Note:  05/23/2017 7:37 AM  Donna Graves  has presented today for surgery, with the diagnosis of chf  The various methods of treatment have been discussed with the patient and family. After consideration of risks, benefits and other options for treatment, the patient has consented to  Procedure(s): BiVI Upgrade (N/A) as a surgical intervention .  The patient's history has been reviewed, patient examined, no change in status, stable for surgery.  I have reviewed the patient's chart and labs.  Questions were answered to the patient's satisfaction.     Lewayne Bunting

## 2017-05-23 NOTE — H&P (View-Only) (Signed)
HPI Donna Graves is referred today by Joni Reining, NP, for evaluation of left ventricular dysfunction. She is a morbidly obese 66 year old woman with a long-standing history of atrial fibrillation and flutter, status post multiple ablations in [place]. Ultimately she underwent AV node ablation and pacemaker insertion. She is already had one generator change out. The patient developed worsening heart failure symptoms and was initially seen with an ejection fraction of 25%. She has been placed on maximal medical therapy, and repeat echo demonstrated persistence of her left ventricular dysfunction. She is referred now to consider upgrade to a biventricular ICD. The patient is also bothered by morbid obesity and has had a difficult time losing weight for the last 40 years.she has never had syncope. She has chronic peripheral edema. She admits to being sedentary. She denies anginal symptoms.her medical therapy is as noted above. Allergies  Allergen Reactions  . Codeine Rash     Current Outpatient Prescriptions  Medication Sig Dispense Refill  . acidophilus (RISAQUAD) CAPS capsule Take 1 capsule by mouth daily. ULTIMATE FLORA PROBIOTIC    . carvedilol (COREG) 12.5 MG tablet TAKE ONE TABLET BY MOUTH TWICE DAILY  1  . Cholecalciferol (VITAMIN D PO) Take 7,000 Units by mouth daily.    Marland Kitchen dicyclomine (BENTYL) 10 MG capsule Take 10 mg by mouth 3 (three) times daily as needed for spasms.    . enalapril (VASOTEC) 10 MG tablet Take 10 mg by mouth 2 (two) times daily.     . furosemide (LASIX) 80 MG tablet Take 2 tablets (160 mg total) by mouth 2 (two) times daily.    Marland Kitchen glimepiride (AMARYL) 2 MG tablet Take 2 mg by mouth daily. BEFORE A MEAL    . magnesium oxide (MAG-OX) 400 MG tablet Take 400 mg by mouth daily.    . metolazone (ZAROXOLYN) 2.5 MG tablet Take 2.5mg  every Friday. 30 tablet 3  . Misc Natural Products (BLACK CHERRY CONCENTRATE PO) Take 2 tablets by mouth daily.     .  Multiple Vitamins-Minerals (ALIVE ONCE DAILY WOMENS 50+ PO) Take 1 tablet by mouth daily.    Marland Kitchen omeprazole (PRILOSEC) 20 MG capsule Take 20 mg by mouth every morning.    . polycarbophil (FIBERCON) 625 MG tablet Take 625 mg by mouth 4 (four) times daily.    . potassium chloride SA (K-DUR,KLOR-CON) 20 MEQ tablet Take 20 mEq by mouth 2 (two) times daily.    . Red Yeast Rice Extract (RED YEAST RICE PO) Take 2 tablets by mouth daily.     . simvastatin (ZOCOR) 40 MG tablet Take 40 mg by mouth every evening.      No current facility-administered medications for this visit.      Past Medical History:  Diagnosis Date  . CHF (congestive heart failure) (HCC)   . Depression   . Diabetes mellitus without complication (HCC)   . 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 Gout   . Hyperlipidemia   . Hypertension   . Hypothyroidism     ROS:   All systems reviewed and negative except as noted in the HPI.   Past Surgical History:  Procedure Laterality Date  . BACK SURGERY    . left toe surgery    . pace maker       Family History  Problem Relation Age of Onset  . Diabetes Mother 64  . Heart attack Mother   . Hypertension Mother   .  Diabetes Brother 58  . Arthritis Father 41     Social History   Social History  . Marital status: Married    Spouse name: N/A  . Number of children: N/A  . Years of education: N/A   Occupational History  . Not on file.   Social History Main Topics  . Smoking status: Never Smoker  . Smokeless tobacco: Never Used  . Alcohol use No  . Drug use: No  . Sexual activity: Not on file   Other Topics Concern  . Not on file   Social History Narrative  . No narrative on file     BP 106/70   Pulse 66   Ht 5\' 4"  (1.626 m)   Wt (!) 335 lb (152 kg)   SpO2 97%   BMI 57.50 kg/m   Physical Exam:  Well appearing 66 year old woman,NAD HEENT: Unremarkable Neck:  6 cm JVD, no thyromegally Lymphatics:  No  adenopathy Back:  No CVA tenderness Lungs:  Clear, with no wheezes, rales, or rhonchi HEART:  Regular rate rhythm, no murmurs, no rubs, no clicks Abd:  soft, positive bowel sounds, no organomegally, no rebound, no guarding Ext:  2 plus pulses, no edema, no cyanosis, no clubbing Skin:  No rashes no nodules Neuro:  CN II through XII intact, motor grossly intact  EKG Sinus rhythm with ventricular pacing, and pacing induced left bundle branch block  DEVICE  Normal device function.  See PaceArt for details.   Assess/Plan: 1. Chronic systolic heart failure - her ejection fraction is 25% despite maximal medical therapy. She has pacing induced left bundle branch block. I discussed the treatment options with the patient. Biventricular pacemaker versus biventricular ICD insertion were both discussed. The pros and cons of both approaches were reviewed and she would like to proceed with biventricu insertion. 2. Morbid obesity - the patient is well over 100 pounds overweight. I've explained to her that the procedure that we are planning to help her heart beat better will not make her any better if she does not lose weight. I also discussed bariatric surgery with the patient. This would be a consideration, particularly if her pumping function improves with biventricular pacing. 3. Paroxysmal atrial arrhythmias - she will continue her current medical therapy. Pacemaker interrogation does not demonstrate any atrial fibrillation or flutter. It is unclear as to why she is not on systemic anticoagulation. We will attempt to obtain additional medical records. 4.hypertension - her blood pressure is now well-controlled. She has no room to up titrate her medical therapy because of relatively low blood pressure.  Lewayne Bunting, M.D.

## 2017-05-23 NOTE — Discharge Summary (Signed)
ELECTROPHYSIOLOGY PROCEDURE DISCHARGE SUMMARY    Patient ID: Donna Graves,  MRN: [id number], DOB/AGE: 1950/12/04 66 y.o.  Admit date: 05/23/2017 Discharge date: 05/24/2017  Primary Care Physician: Wilson Singer, MD Primary Cardiologist: Wyline Mood Electrophysiologist: Ladona Ridgel  Primary Discharge Diagnosis:  1.  Chronic systolic heart failure s/p attempted CRTD upgrade this admission  Secondary Discharge Diagnosis:  1.  Complete heart block s/p PPM 2.  Morbid obesity 3.  HTN 4.  Hyperlipidemia 5.  Diabetes 6.  Depression  Allergies  Allergen Reactions  . Codeine Rash     Procedures This Admission:  1.  Upgrade of previously implanted dual chamber PPM to dual chamber ICD (failed LV lead placement) on 05/23/17 by Dr Ladona Ridgel.  See op note for full details. There were no immediate post procedure complications. 2.  CXR on 05/24/17 demonstrated no pneumothorax status post device implantation.   Brief HPI: Donna Graves is a 66 y.o. female with a past medical history significant for CHB status post pacemaker implant with subsequent cardiomyopathy. The patient has persistent LV dysfunction despite guideline directed therapy.  Risks, benefits, and alternatives to CRTD upgrade were reviewed with the patient who wished to proceed.   Hospital Course:  The patient was admitted and underwent upgrade to a dual chamber ICD with details as outlined above. She was monitored on telemetry overnight which demonstrated AV pacing.  Left chest was without hematoma or ecchymosis.  The device was interrogated and found to be functioning normally.  CXR was obtained and demonstrated no pneumothorax status post device implantation.  Wound care, arm mobility, and restrictions were reviewed with the patient.  The patient was examined and considered stable for discharge to home.   The patient's discharge medications include an ACE-I (Enalapril) and beta blocker (Coreg).   Physical Exam: Vitals:   05/23/17 1500 05/23/17 1900 05/23/17 1952 05/24/17 0224  BP: (!) 105/56 128/71 (!) 135/59 126/74  Pulse: 72 74 70 72  Resp: (!) 23 (!) 23 16 (!) 22  Temp: 97.6 F (36.4 C) 98.2 F (36.8 C) 98.8 F (37.1 C) 98.2 F (36.8 C)  TempSrc: Oral Oral Oral Oral  SpO2: 94% 94% 92% 94%  Weight:      Height:        GEN- The patient is morbidly obese appearing, alert and oriented x 3 today.   HEENT: normocephalic, atraumatic; sclera clear, conjunctiva pink; hearing intact; oropharynx clear; neck supple  Lungs- Clear to ausculation bilaterally, normal work of breathing.  No wheezes, rales, rhonchi Heart- Regular rate and rhythm (paced) GI- soft, non-tender, non-distended, bowel sounds present  Extremities- no clubbing, cyanosis, or edema  MS- no significant deformity or atrophy Skin- warm and dry, no rash or lesion, left chest without hematoma/ecchymosis Psych- euthymic mood, full affect Neuro- strength and sensation are intact   Labs:   Lab Results  Component Value Date   WBC 6.0 05/24/2017   HGB 15.8 (H) 05/24/2017   HCT 50.3 (H) 05/24/2017   MCV 92.0 05/24/2017   PLT 180 05/24/2017     Recent Labs Lab 05/18/17 1028  NA 136  K 3.6  CL 96*  CO2 27  BUN 20  CREATININE 0.84  CALCIUM 8.9  GLUCOSE 217*    Discharge Medications:  Allergies as of 05/24/2017      Reactions   Codeine Rash      Medication List    TAKE these medications   acetaminophen 650 MG CR tablet Commonly known as:  TYLENOL Take  650-1,300 mg by mouth every 8 (eight) hours as needed for pain.   acidophilus Caps capsule Take 1 capsule by mouth daily. ULTIMATE FLORA PROBIOTIC   ALIVE ONCE DAILY WOMENS 50+ PO Take 1 tablet by mouth daily.   BLACK CHERRY CONCENTRATE PO Take 2 tablets by mouth daily.   carvedilol 12.5 MG tablet Commonly known as:  COREG TAKE 12.5 mg  BY MOUTH TWICE DAILY   dicyclomine 10 MG capsule Commonly known as:  BENTYL Take 10 mg by mouth 3 (three) times daily as needed for  spasms.   enalapril 10 MG tablet Commonly known as:  VASOTEC Take 10 mg by mouth 2 (two) times daily.   furosemide 80 MG tablet Commonly known as:  LASIX Take 2 tablets (160 mg total) by mouth 2 (two) times daily.   glimepiride 2 MG tablet Commonly known as:  AMARYL Take 4 mg by mouth daily. BEFORE A MEAL   magnesium oxide 400 MG tablet Commonly known as:  MAG-OX Take 400 mg by mouth daily.   metolazone 2.5 MG tablet Commonly known as:  ZAROXOLYN Take 2.5mg  every Friday. What changed:  how much to take  how to take this  when to take this  additional instructions   omeprazole 20 MG capsule Commonly known as:  PRILOSEC Take 20 mg by mouth every morning.   ondansetron 4 MG tablet Commonly known as:  ZOFRAN Take 4 mg by mouth every 8 (eight) hours as needed for nausea.   polycarbophil 625 MG tablet Commonly known as:  FIBERCON Take 625 mg by mouth 4 (four) times daily.   potassium chloride SA 20 MEQ tablet Commonly known as:  K-DUR,KLOR-CON Take 20 mEq by mouth 2 (two) times daily.   RED YEAST RICE PO Take 2 tablets by mouth daily.   simvastatin 40 MG tablet Commonly known as:  ZOCOR Take 40 mg by mouth every evening.   VITAMIN D PO Take 7,000 Units by mouth daily.            Discharge Care Instructions        Start     Ordered   05/24/17 0000  Increase activity slowly     05/24/17 0738   05/24/17 0000  Diet - low sodium heart healthy     05/24/17 7588      Disposition:  Discharge Instructions    Diet - low sodium heart healthy    Complete by:  As directed    Increase activity slowly    Complete by:  As directed      Follow-up Information    [facility] Follow up on 06/05/2017.   Specialty:  Cardiology Why:  at 3:30PM  Contact information: [address] [place] [address] [phone]          Duration of Discharge Encounter: Greater than 30 minutes including physician  time.  Signed, Gypsy Balsam, NP 05/24/2017 7:38 AM  EP Attending  Patient seen and examined. Agree with above. The patient is doing well after ICD insertion. We were unable to place LV lead due to extremely limited coronary venous anatomy as described in my operative note. I discussed the options with her in detail including bariatric surgery which I have suggested she consider, as well as consultation for an epicardial LV lead (which I would not recommend unless she loses 100 lbs.) Usual followup. Her device interogation demonstrates normal Biv ICD function with the LV port plugged.  Leonia Reeves.D.  Mikle Bosworth.D.

## 2017-05-23 NOTE — Care Management Note (Signed)
Case Management Note  Patient Details  Name: Donna Graves MRN: [id number] Date of Birth: April 01, 1951  Subjective/Objective:   From home, s/p BiVi upgrade.               Action/Plan: NCM will follow for dc needs.   Expected Discharge Date:                  Expected Discharge Plan:  Home/Self Care  In-House Referral:     Discharge planning Services  CM Consult  Post Acute Care Choice:    Choice offered to:     DME Arranged:    DME Agency:     HH Arranged:    HH Agency:     Status of Service:  In process, will continue to follow  If discussed at Long Length of Stay Meetings, dates discussed:    Additional Comments:  Leone Haven, RN 05/23/2017, 3:25 PM

## 2017-05-23 NOTE — Progress Notes (Signed)
Orthopedic Tech Progress Note Patient Details:  Donna Graves 1951-03-14 [id number]  Ortho Devices Type of Ortho Device: Arm sling   Saul Fordyce 05/23/2017, 12:27 PM

## 2017-05-24 ENCOUNTER — Ambulatory Visit (HOSPITAL_COMMUNITY): Payer: Medicare Other

## 2017-05-24 DIAGNOSIS — I442 Atrioventricular block, complete: Secondary | ICD-10-CM | POA: Diagnosis not present

## 2017-05-24 DIAGNOSIS — I447 Left bundle-branch block, unspecified: Secondary | ICD-10-CM | POA: Diagnosis not present

## 2017-05-24 DIAGNOSIS — I429 Cardiomyopathy, unspecified: Secondary | ICD-10-CM | POA: Diagnosis not present

## 2017-05-24 DIAGNOSIS — I5022 Chronic systolic (congestive) heart failure: Secondary | ICD-10-CM | POA: Diagnosis not present

## 2017-05-24 DIAGNOSIS — I428 Other cardiomyopathies: Secondary | ICD-10-CM | POA: Diagnosis not present

## 2017-05-24 DIAGNOSIS — I110 Hypertensive heart disease with heart failure: Secondary | ICD-10-CM | POA: Diagnosis not present

## 2017-05-24 LAB — CBC
HCT: 50.3 % — ABNORMAL HIGH (ref 36.0–46.0)
Hemoglobin: 15.8 g/dL — ABNORMAL HIGH (ref 12.0–15.0)
MCH: 28.9 pg (ref 26.0–34.0)
MCHC: 31.4 g/dL (ref 30.0–36.0)
MCV: 92 fL (ref 78.0–100.0)
PLATELETS: 180 10*3/uL (ref 150–400)
RBC: 5.47 MIL/uL — ABNORMAL HIGH (ref 3.87–5.11)
RDW: 15.7 % — ABNORMAL HIGH (ref 11.5–15.5)
WBC: 6 10*3/uL (ref 4.0–10.5)

## 2017-05-24 LAB — GLUCOSE, CAPILLARY: GLUCOSE-CAPILLARY: 150 mg/dL — AB (ref 65–99)

## 2017-05-24 MED FILL — Gentamicin Sulfate Inj 40 MG/ML: INTRAMUSCULAR | Qty: 2 | Status: AC

## 2017-05-24 MED FILL — Sodium Chloride Irrigation Soln 0.9%: Qty: 500 | Status: AC

## 2017-05-24 NOTE — Care Management Note (Signed)
Case Management Note  Patient Details  Name: JAYMESON BAKEMAN MRN: [id number] Date of Birth: March 11, 1951  Subjective/Objective:     From home with spouse, pta indep, s/p BiVi upgrade , for dc today, no needs.               Action/Plan:   Expected Discharge Date:                  Expected Discharge Plan:  Home/Self Care  In-House Referral:     Discharge planning Services  CM Consult  Post Acute Care Choice:    Choice offered to:     DME Arranged:    DME Agency:     HH Arranged:    HH Agency:     Status of Service:  Completed, signed off  If discussed at [employer] of Stay Meetings, dates discussed:    Additional Comments:  Leone Haven, RN 05/24/2017, 9:29 AM

## 2017-05-30 ENCOUNTER — Telehealth: Payer: Self-pay | Admitting: Internal Medicine

## 2017-05-30 NOTE — Telephone Encounter (Signed)
°  New Message   pt verbalized that she is calling for rn   She is sick on her stomach and wants something called into  [facility]

## 2017-05-30 NOTE — Telephone Encounter (Signed)
Pt calling requesting medication for nausea.  Notified Pt that she needed to contact her PCP to get a prescription for nausea.  Pt indicates understanding.  No further action necessary.

## 2017-06-05 ENCOUNTER — Ambulatory Visit (INDEPENDENT_AMBULATORY_CARE_PROVIDER_SITE_OTHER): Payer: Medicare Other | Admitting: *Deleted

## 2017-06-05 DIAGNOSIS — I5022 Chronic systolic (congestive) heart failure: Secondary | ICD-10-CM

## 2017-06-07 ENCOUNTER — Telehealth: Payer: Self-pay

## 2017-06-07 NOTE — Telephone Encounter (Signed)
Spoke with pt, pt stated that her nausea was better and that she did not feel like she needed to see Francis Dowse on Friday. Pt also stated that the swelling had gone down some since the wound check.

## 2017-06-09 ENCOUNTER — Encounter: Payer: Medicare Other | Admitting: Physician Assistant

## 2017-06-20 LAB — CUP PACEART INCLINIC DEVICE CHECK
Date Time Interrogation Session: 20181002114733
HIGH POWER IMPEDANCE MEASURED VALUE: 60 Ohm
Implantable Lead Implant Date: 20180904
Implantable Lead Location: 753859
Implantable Lead Location: 753860
Implantable Lead Model: 5076
Implantable Lead Model: 5076
Implantable Pulse Generator Implant Date: 20180904
Lead Channel Impedance Value: 513 Ohm
Lead Channel Pacing Threshold Amplitude: 1 V
Lead Channel Pacing Threshold Pulse Width: 0.4 ms
MDC IDC LEAD IMPLANT DT: 20050121
MDC IDC LEAD IMPLANT DT: 20141223
MDC IDC LEAD LOCATION: 753860
MDC IDC MSMT LEADCHNL RA SENSING INTR AMPL: 0.5 mV
MDC IDC MSMT LEADCHNL RV IMPEDANCE VALUE: 361 Ohm
MDC IDC MSMT LEADCHNL RV PACING THRESHOLD AMPLITUDE: 1.25 V
MDC IDC MSMT LEADCHNL RV PACING THRESHOLD PULSEWIDTH: 0.4 ms
MDC IDC STAT BRADY RV PERCENT PACED: 98.7 %

## 2017-06-20 NOTE — Progress Notes (Signed)
Wound check appointment. Steri-strips removed. Wound without redness. Incision edges approximated, wound well healed. Small Soft hematoma noted, pt instructed to call if does not resolve. Normal device function. Thresholds, sensing, and impedances consistent with implant measurements. Device programmed with adaptive on for extra safety margin until 3 month visit. Histogram distribution appropriate for patient and level of activity. No mode switches or high ventricular rates noted. Patient educated about wound care, arm mobility, lifting restrictions. ROV w/ GT 09/04/2017

## 2017-06-22 ENCOUNTER — Encounter (HOSPITAL_COMMUNITY): Payer: Self-pay | Admitting: Emergency Medicine

## 2017-06-22 ENCOUNTER — Inpatient Hospital Stay (HOSPITAL_COMMUNITY)
Admission: EM | Admit: 2017-06-22 | Discharge: 2017-06-30 | DRG: 292 | Disposition: A | Discharge status: Home / Self Care | Payer: Medicare Other | Attending: Family Medicine | Admitting: Family Medicine

## 2017-06-22 ENCOUNTER — Emergency Department (HOSPITAL_COMMUNITY): Payer: Medicare Other

## 2017-06-22 DIAGNOSIS — I5022 Chronic systolic (congestive) heart failure: Secondary | ICD-10-CM

## 2017-06-22 DIAGNOSIS — E119 Type 2 diabetes mellitus without complications: Secondary | ICD-10-CM | POA: Diagnosis present

## 2017-06-22 DIAGNOSIS — I10 Essential (primary) hypertension: Secondary | ICD-10-CM | POA: Diagnosis not present

## 2017-06-22 DIAGNOSIS — E785 Hyperlipidemia, unspecified: Secondary | ICD-10-CM | POA: Diagnosis present

## 2017-06-22 DIAGNOSIS — M109 Gout, unspecified: Secondary | ICD-10-CM | POA: Diagnosis present

## 2017-06-22 DIAGNOSIS — Z8249 Family history of ischemic heart disease and other diseases of the circulatory system: Secondary | ICD-10-CM

## 2017-06-22 DIAGNOSIS — Z7984 Long term (current) use of oral hypoglycemic drugs: Secondary | ICD-10-CM

## 2017-06-22 DIAGNOSIS — Z95810 Presence of automatic (implantable) cardiac defibrillator: Secondary | ICD-10-CM | POA: Diagnosis not present

## 2017-06-22 DIAGNOSIS — Z23 Encounter for immunization: Secondary | ICD-10-CM | POA: Diagnosis present

## 2017-06-22 DIAGNOSIS — I5023 Acute on chronic systolic (congestive) heart failure: Secondary | ICD-10-CM | POA: Diagnosis present

## 2017-06-22 DIAGNOSIS — I110 Hypertensive heart disease with heart failure: Secondary | ICD-10-CM | POA: Diagnosis not present

## 2017-06-22 DIAGNOSIS — I2583 Coronary atherosclerosis due to lipid rich plaque: Secondary | ICD-10-CM

## 2017-06-22 DIAGNOSIS — E876 Hypokalemia: Secondary | ICD-10-CM | POA: Diagnosis present

## 2017-06-22 DIAGNOSIS — F329 Major depressive disorder, single episode, unspecified: Secondary | ICD-10-CM | POA: Diagnosis present

## 2017-06-22 DIAGNOSIS — Z885 Allergy status to narcotic agent status: Secondary | ICD-10-CM

## 2017-06-22 DIAGNOSIS — J9811 Atelectasis: Secondary | ICD-10-CM | POA: Diagnosis present

## 2017-06-22 DIAGNOSIS — D751 Secondary polycythemia: Secondary | ICD-10-CM | POA: Diagnosis present

## 2017-06-22 DIAGNOSIS — I2510 Atherosclerotic heart disease of native coronary artery without angina pectoris: Secondary | ICD-10-CM | POA: Diagnosis present

## 2017-06-22 DIAGNOSIS — E118 Type 2 diabetes mellitus with unspecified complications: Secondary | ICD-10-CM | POA: Diagnosis not present

## 2017-06-22 DIAGNOSIS — K219 Gastro-esophageal reflux disease without esophagitis: Secondary | ICD-10-CM | POA: Diagnosis present

## 2017-06-22 DIAGNOSIS — I509 Heart failure, unspecified: Secondary | ICD-10-CM | POA: Diagnosis not present

## 2017-06-22 DIAGNOSIS — R0602 Shortness of breath: Secondary | ICD-10-CM | POA: Diagnosis present

## 2017-06-22 DIAGNOSIS — Z79899 Other long term (current) drug therapy: Secondary | ICD-10-CM

## 2017-06-22 DIAGNOSIS — Z833 Family history of diabetes mellitus: Secondary | ICD-10-CM | POA: Diagnosis not present

## 2017-06-22 LAB — BASIC METABOLIC PANEL
Anion gap: 12 (ref 5–15)
BUN: 15 mg/dL (ref 6–20)
CHLORIDE: 97 mmol/L — AB (ref 101–111)
CO2: 28 mmol/L (ref 22–32)
CREATININE: 0.72 mg/dL (ref 0.44–1.00)
Calcium: 8.7 mg/dL — ABNORMAL LOW (ref 8.9–10.3)
Glucose, Bld: 143 mg/dL — ABNORMAL HIGH (ref 65–99)
Potassium: 3.6 mmol/L (ref 3.5–5.1)
SODIUM: 137 mmol/L (ref 135–145)

## 2017-06-22 LAB — CBG MONITORING, ED: GLUCOSE-CAPILLARY: 164 mg/dL — AB (ref 65–99)

## 2017-06-22 LAB — BRAIN NATRIURETIC PEPTIDE: B NATRIURETIC PEPTIDE 5: 379 pg/mL — AB (ref 0.0–100.0)

## 2017-06-22 LAB — CBC
HCT: 52.7 % — ABNORMAL HIGH (ref 36.0–46.0)
Hemoglobin: 16.6 g/dL — ABNORMAL HIGH (ref 12.0–15.0)
MCH: 29.6 pg (ref 26.0–34.0)
MCHC: 31.5 g/dL (ref 30.0–36.0)
MCV: 93.9 fL (ref 78.0–100.0)
PLATELETS: 179 10*3/uL (ref 150–400)
RBC: 5.61 MIL/uL — ABNORMAL HIGH (ref 3.87–5.11)
RDW: 16.4 % — AB (ref 11.5–15.5)
WBC: 6.1 10*3/uL (ref 4.0–10.5)

## 2017-06-22 LAB — TROPONIN I

## 2017-06-22 MED ORDER — RISAQUAD PO CAPS
1.0000 | ORAL_CAPSULE | Freq: Every day | ORAL | Status: DC
  Administered 2017-06-23 – 2017-06-30 (×8): 1 via ORAL
  Filled 2017-06-22 (×8): qty 1

## 2017-06-22 MED ORDER — ENOXAPARIN SODIUM 40 MG/0.4ML ~~LOC~~ SOLN: 40.0000 mg | SUBCUTANEOUS | Status: DC

## 2017-06-22 MED ORDER — ONDANSETRON HCL 4 MG/2ML IJ SOLN: 4.0000 mg | Freq: Four times a day (QID) | INTRAMUSCULAR | Status: DC | PRN

## 2017-06-22 MED ORDER — PANTOPRAZOLE SODIUM 40 MG PO TBEC
40.0000 mg | DELAYED_RELEASE_TABLET | Freq: Every day | ORAL | Status: DC
  Administered 2017-06-23 – 2017-06-30 (×8): 40 mg via ORAL
  Filled 2017-06-22 (×8): qty 1

## 2017-06-22 MED ORDER — FUROSEMIDE 10 MG/ML IJ SOLN
80.0000 mg | Freq: Once | INTRAMUSCULAR | Status: AC
  Administered 2017-06-22: 80 mg via INTRAVENOUS
  Filled 2017-06-22: qty 8

## 2017-06-22 MED ORDER — ZOLPIDEM TARTRATE 5 MG PO TABS
5.0000 mg | ORAL_TABLET | Freq: Every evening | ORAL | Status: DC | PRN
  Administered 2017-06-23 – 2017-06-29 (×6): 5 mg via ORAL
  Filled 2017-06-22 (×6): qty 1

## 2017-06-22 MED ORDER — INFLUENZA VAC SPLIT HIGH-DOSE 0.5 ML IM SUSY
0.5000 mL | PREFILLED_SYRINGE | INTRAMUSCULAR | Status: AC
  Administered 2017-06-23: 0.5 mL via INTRAMUSCULAR
  Filled 2017-06-22: qty 0.5

## 2017-06-22 MED ORDER — ALPRAZOLAM 0.5 MG PO TABS
0.2500 mg | ORAL_TABLET | Freq: Two times a day (BID) | ORAL | Status: DC | PRN
  Administered 2017-06-23 – 2017-06-28 (×4): 0.25 mg via ORAL
  Filled 2017-06-22 (×4): qty 1

## 2017-06-22 MED ORDER — SODIUM CHLORIDE 0.9% FLUSH
3.0000 mL | Freq: Two times a day (BID) | INTRAVENOUS | Status: DC
  Administered 2017-06-23 – 2017-06-30 (×15): 3 mL via INTRAVENOUS

## 2017-06-22 MED ORDER — POTASSIUM CHLORIDE CRYS ER 20 MEQ PO TBCR
20.0000 meq | EXTENDED_RELEASE_TABLET | Freq: Two times a day (BID) | ORAL | Status: DC
  Administered 2017-06-23 – 2017-06-28 (×11): 20 meq via ORAL
  Filled 2017-06-22 (×12): qty 1

## 2017-06-22 MED ORDER — ADULT MULTIVITAMIN W/MINERALS CH
1.0000 | ORAL_TABLET | Freq: Every day | ORAL | Status: DC
  Administered 2017-06-23 – 2017-06-30 (×8): 1 via ORAL
  Filled 2017-06-22 (×8): qty 1

## 2017-06-22 MED ORDER — CARVEDILOL 12.5 MG PO TABS
12.5000 mg | ORAL_TABLET | Freq: Two times a day (BID) | ORAL | Status: DC
  Administered 2017-06-23 – 2017-06-30 (×14): 12.5 mg via ORAL
  Filled 2017-06-22 (×15): qty 1

## 2017-06-22 MED ORDER — SIMVASTATIN 10 MG PO TABS
40.0000 mg | ORAL_TABLET | Freq: Every day | ORAL | Status: DC
  Administered 2017-06-23 – 2017-06-29 (×7): 40 mg via ORAL
  Filled 2017-06-22 (×8): qty 4

## 2017-06-22 MED ORDER — ACETAMINOPHEN 325 MG PO TABS
650.0000 mg | ORAL_TABLET | ORAL | Status: DC | PRN
  Administered 2017-06-23 – 2017-06-28 (×6): 650 mg via ORAL
  Filled 2017-06-22 (×6): qty 2

## 2017-06-22 MED ORDER — SODIUM CHLORIDE 0.9% FLUSH
3.0000 mL | INTRAVENOUS | Status: DC | PRN
Start: 2017-06-22 — End: 2017-06-30

## 2017-06-22 MED ORDER — SODIUM CHLORIDE 0.9 % IV SOLN: 250.0000 mL | INTRAVENOUS | Status: DC | PRN

## 2017-06-22 MED ORDER — INSULIN ASPART 100 UNIT/ML ~~LOC~~ SOLN
0.0000 [IU] | Freq: Three times a day (TID) | SUBCUTANEOUS | Status: DC
  Administered 2017-06-23: 2 [IU] via SUBCUTANEOUS
  Administered 2017-06-24 – 2017-06-25 (×2): 1 [IU] via SUBCUTANEOUS
  Administered 2017-06-25 – 2017-06-26 (×3): 2 [IU] via SUBCUTANEOUS
  Administered 2017-06-26 (×2): 1 [IU] via SUBCUTANEOUS
  Administered 2017-06-27: 3 [IU] via SUBCUTANEOUS
  Administered 2017-06-27 – 2017-06-28 (×3): 2 [IU] via SUBCUTANEOUS
  Administered 2017-06-28: 3 [IU] via SUBCUTANEOUS
  Administered 2017-06-28: 1 [IU] via SUBCUTANEOUS
  Administered 2017-06-29 (×2): 2 [IU] via SUBCUTANEOUS
  Administered 2017-06-29: 3 [IU] via SUBCUTANEOUS
  Administered 2017-06-30: 2 [IU] via SUBCUTANEOUS

## 2017-06-22 MED ORDER — MAGNESIUM OXIDE 400 (241.3 MG) MG PO TABS
400.0000 mg | ORAL_TABLET | Freq: Every day | ORAL | Status: DC
  Administered 2017-06-23 – 2017-06-30 (×8): 400 mg via ORAL
  Filled 2017-06-22 (×8): qty 1

## 2017-06-22 MED ORDER — DICYCLOMINE HCL 10 MG PO CAPS
10.0000 mg | ORAL_CAPSULE | Freq: Three times a day (TID) | ORAL | Status: DC | PRN
  Administered 2017-06-25: 10 mg via ORAL
  Filled 2017-06-22: qty 1

## 2017-06-22 MED ORDER — ENALAPRIL MALEATE 5 MG PO TABS
10.0000 mg | ORAL_TABLET | Freq: Two times a day (BID) | ORAL | Status: DC
  Administered 2017-06-23 – 2017-06-30 (×14): 10 mg via ORAL
  Filled 2017-06-22 (×14): qty 2

## 2017-06-22 MED ORDER — INSULIN ASPART 100 UNIT/ML ~~LOC~~ SOLN
0.0000 [IU] | Freq: Every day | SUBCUTANEOUS | Status: DC
  Administered 2017-06-26: 1 [IU] via SUBCUTANEOUS
  Administered 2017-06-28: 2 [IU] via SUBCUTANEOUS
  Administered 2017-06-29: 3 [IU] via SUBCUTANEOUS

## 2017-06-22 MED ORDER — FUROSEMIDE 10 MG/ML IJ SOLN
80.0000 mg | Freq: Two times a day (BID) | INTRAMUSCULAR | Status: AC
  Administered 2017-06-23 – 2017-06-29 (×14): 80 mg via INTRAVENOUS
  Filled 2017-06-22 (×14): qty 8

## 2017-06-22 NOTE — ED Provider Notes (Signed)
[facility]-EMER[facility]ENCY DEPT Provider Note   CSN: [id number] Arrival date & time: 06/22/17  1551     History   Chief Complaint Chief Complaint  Patient presents with  . Weakness    HPI Donna Graves is a 66 y.o. female.  HPI Complains of progressively worsening shortness of breath, swelling of her abdomen and swelling of her legs over the past one week to the point where she has difficulty walking to the bathroom. She reports that on normal day she can walk approximately 500 feet. He denies any chest pain, cough or fever. Denies noncompliance with her medications. No other associated symptoms. Presently hungry. Shortness of breath is worse with lying supine or exertion and improved with remaining still Past Medical History:  Diagnosis Date  . CHF (congestive heart failure) (HCC)   . Depression   . Diabetes mellitus without complication (HCC)   . 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 GERD (gastroesophageal reflux disease)   . Gout   . Hyperlipidemia   . Hypertension   . Hypothyroidism     Patient Active Problem List   Diagnosis Date Noted  . Chronic systolic heart failure (HCC) 05/23/2017  . Atypical chest pain   . Elevated troponin   . Chronic systolic CHF (congestive heart failure) (HCC) 03/30/2017  . Near syncope 03/30/2017  . Type II diabetes mellitus (HCC) 03/30/2017  . AF (paroxysmal atrial fibrillation) (HCC) 03/30/2017  . Essential hypertension 03/30/2017  . CAD (coronary artery disease) 03/30/2017   Echocardiogram performed 04/19/2017 shows ejection fraction 25-30% Past Surgical History:  Procedure Laterality Date  . ANAL FISSURE REPAIR    . BACK SURGERY    . BIV UPGRADE N/A 05/23/2017   Procedure: BiVI Upgrade;  Surgeon: Marinus Maw, MD;  Location: [facility] INVASIVE CV LAB;  Service: Cardiovascular;  Laterality: N/A;  . ICD IMPLANT  05/23/2017   biv  . left toe surgery    . pace maker      OB History    No data  available       Home Medications    Prior to Admission medications   Medication Sig Start Date End Date Taking? Authorizing Provider  acetaminophen (TYLENOL) 650 MG CR tablet Take 650-1,300 mg by mouth every 8 (eight) hours as needed for pain.    [provider]  acidophilus (RISAQUAD) CAPS capsule Take 1 capsule by mouth daily. ULTIMATE FLORA PROBIOTIC    [provider]  carvedilol (COREG) 12.5 MG tablet TAKE 12.5 mg  BY MOUTH TWICE DAILY 03/01/17   [provider]  Cholecalciferol (VITAMIN D PO) Take 7,000 Units by mouth daily.    [provider]  dicyclomine (BENTYL) 10 MG capsule Take 10 mg by mouth 3 (three) times daily as needed for spasms.    [provider]  enalapril (VASOTEC) 10 MG tablet Take 10 mg by mouth 2 (two) times daily.     [provider]  furosemide (LASIX) 80 MG tablet Take 2 tablets (160 mg total) by mouth 2 (two) times daily. 04/01/17   Emokpae, Ejiroghene E, MD  glimepiride (AMARYL) 2 MG tablet Take 4 mg by mouth daily. BEFORE A MEAL    [provider]  magnesium oxide (MAG-OX) 400 MG tablet Take 400 mg by mouth daily.    [provider]  metolazone (ZAROXOLYN) 2.5 MG tablet Take 2.5mg  every Friday. Patient taking differently: Take 2.5 mg by mouth once a week. Take 2.5mg  every  Friday. 04/07/17   Emokpae, Heloise Beecham, MD  Misc Natural Products (BLACK CHERRY CONCENTRATE PO) Take 2 tablets by mouth daily.     [provider]  Multiple Vitamins-Minerals (ALIVE ONCE DAILY WOMENS 50+ PO) Take 1 tablet by mouth daily.    [provider]  omeprazole (PRILOSEC) 20 MG capsule Take 20 mg by mouth every morning.    [provider]  ondansetron (ZOFRAN) 4 MG tablet Take 4 mg by mouth every 8 (eight) hours as needed for nausea. 05/06/17   [provider]  polycarbophil (FIBERCON) 625 MG tablet Take 625 mg by mouth 4 (four) times daily.    [provider]  potassium  chloride SA (K-DUR,KLOR-CON) 20 MEQ tablet Take 20 mEq by mouth 2 (two) times daily.    [provider]  Red Yeast Rice Extract (RED YEAST RICE PO) Take 2 tablets by mouth daily.     [provider]  simvastatin (ZOCOR) 40 MG tablet Take 40 mg by mouth every evening.     [provider]    Family History Family History  Problem Relation Age of Onset  . Diabetes Mother 38  . Heart attack Mother   . Hypertension Mother   . Diabetes Brother 71  . Arthritis Father 36    Social History Social History  Substance Use Topics  . Smoking status: Never Smoker  . Smokeless tobacco: Never Used  . Alcohol use No     Allergies   Codeine   Review of Systems Review of Systems  Constitutional: Negative.   HENT: Negative.   Respiratory: Positive for shortness of breath.   Cardiovascular: Positive for leg swelling.  Gastrointestinal: Positive for abdominal distention.  Musculoskeletal: Negative.   Skin: Negative.   Neurological: Negative.   Psychiatric/Behavioral: Negative.   All other systems reviewed and are negative.    Physical Exam Updated Vital Signs BP 133/74 (BP Location: Right Arm)   Pulse 68   Temp 98.2 F (36.8 C) (Oral)   Resp 18   Ht  (1.626 m)   Wt (!) 146.5 kg (323 lb)   SpO2 95%   BMI 55.44 kg/m   Physical Exam  Constitutional: She is oriented to person, place, and time.  Chronically ill-appearing  HENT:  Head: Normocephalic and atraumatic.  Eyes: Pupils are equal, round, and reactive to light. Conjunctivae are normal.  Neck: Neck supple. JVD present. No tracheal deviation present. No thyromegaly present.  Cardiovascular: Normal rate and regular rhythm.   No murmur heard. Pulmonary/Chest: Effort normal and breath sounds normal.  Abdominal: Soft. Bowel sounds are normal. She exhibits no distension. There is no tenderness.  Morbidly obese  Musculoskeletal: Normal range of motion. She exhibits edema. She exhibits no  tenderness.  3+ pretibial pitting edema bilaterally  Neurological: She is alert and oriented to person, place, and time. Coordination normal.  Skin: Skin is warm and dry. No rash noted.  Psychiatric: She has a normal mood and affect.  Nursing note and vitals reviewed.    ED Treatments / Results  Labs (all labs ordered are listed, but only abnormal results are displayed) Labs Reviewed  BASIC METABOLIC PANEL - Abnormal; Notable for the following:       Result Value   Chloride 97 (*)    Glucose, Bld 143 (*)    Calcium 8.7 (*)    All other components within normal limits  CBG MONITORING, ED - Abnormal; Notable for the following:    Glucose-Capillary 164 (*)  All other components within normal limits  TROPONIN I  URINALYSIS, ROUTINE W REFLEX MICROSCOPIC  CBC    EKG  EKG Interpretation  Date/Time:  Thursday June 22 2017 16:25:55 EDT Ventricular Rate:  70 PR Interval:  188 QRS Duration: 160 QT Interval:  460 QTC Calculation: 496 R Axis:   -8 Text Interpretation:  AV dual-paced rhythm with occasional Premature ventricular complexes Abnormal ECG No significant change since last tracing Confirmed by Doug Sou [phone]) on 06/22/2017 4:29:28 PM       Radiology No results found.  Procedures Procedures (including critical care time)  Medications Ordered in ED Medications - No data to display  Chest x-ray viewed by me Results for orders placed or performed during the hospital encounter of 06/22/17  Basic metabolic panel  Result Value Ref Range   Sodium 137 135 - 145 mmol/L   Potassium 3.6 3.5 - 5.1 mmol/L   Chloride 97 (L) 101 - 111 mmol/L   CO2 28 22 - 32 mmol/L   Glucose, Bld 143 (H) 65 - 99 mg/dL   BUN 15 6 - 20 mg/dL   Creatinine, Ser 1.91 0.44 - 1.00 mg/dL   Calcium 8.7 (L) 8.9 - 10.3 mg/dL   GFR calc non Af Amer >60 >60 mL/min   GFR calc Af Amer >60 >60 mL/min   Anion gap 12 5 - 15  Troponin I  Result Value Ref Range   Troponin I <0.03 <0.03 ng/mL    CBC  Result Value Ref Range   WBC 6.1 4.0 - 10.5 K/uL   RBC 5.61 (H) 3.87 - 5.11 MIL/uL   Hemoglobin 16.6 (H) 12.0 - 15.0 g/dL   HCT 47.8 (H) 29.5 - 62.1 %   MCV 93.9 78.0 - 100.0 fL   MCH 29.6 26.0 - 34.0 pg   MCHC 31.5 30.0 - 36.0 g/dL   RDW 30.8 (H) 65.7 - 84.6 %   Platelets 179 150 - 400 K/uL  Brain natriuretic peptide  Result Value Ref Range   B Natriuretic Peptide 379.0 (H) 0.0 - 100.0 pg/mL  CBG monitoring, ED  Result Value Ref Range   Glucose-Capillary 164 (H) 65 - 99 mg/dL   Dg Chest 2 View  Result Date: 06/22/2017 CLINICAL DATA:  Shortness of breath and dry cough this week. History of CHF and pneumonia. EXAM: CHEST  2 VIEW COMPARISON:  Chest radiograph May 24, 2017 FINDINGS: Stable moderate cardiomegaly. Mediastinal silhouette is unremarkable for this low inspiratory examination with crowded vascular markings. Faint bibasilar strandy densities. No pleural effusion or focal consolidation. No pneumothorax. Three lead LEFT AICD in situ. Large body habitus. Osseous structures are nonsuspicious. IMPRESSION: Stable cardiomegaly, minimal bibasilar atelectasis. Electronically Signed   By: Awilda Metro M.D.   On: 06/22/2017 22:17   Dg Chest 2 View  Result Date: 05/24/2017 CLINICAL DATA:  Status post ICD placement. History of CHF, hypertension, and diabetes. EXAM: CHEST  2 VIEW COMPARISON:  PA and lateral chest x-ray of March 30, 2017 FINDINGS: The patient has undergone placement of an ICD. No postprocedure pneumothorax is observed. Positioning of the new right ventricular electrode is appropriate. The previous electrodes are in stable position as well. The cardiac silhouette remains mildly enlarged. The pulmonary vascularity is not clearly engorged. The bony thorax exhibits no acute abnormality. IMPRESSION: No postprocedure complication following ICD placement. Stable cardiomegaly without significant pulmonary vascular congestion. Electronically Signed   By: David  [place] M.D.    On: 05/24/2017 07:46   Initial Impression / Assessment and  Plan / ED Course  I have reviewed the triage vital signs and the nursing notes.  Pertinent labs & imaging results that were available during my care of the patient were reviewed by me and considered in my medical decision making (see chart for details).   with elevated BNP, vascular congestion on chest x-ray and increasing peripheral edema and increased dyspnea with exertion patient fluid overloaded. Intravenous Lasix ordered. I consulted Dr.OPYD from hospitalist service who will arrange for overnight stay    Final Clinical Impressions(s) / ED Diagnoses  Diagnosis acute on chronic congestive heart failure Final diagnoses:  None    New Prescriptions New Prescriptions   No medications on file     Doug Sou, MD 06/22/17 2243

## 2017-06-22 NOTE — ED Triage Notes (Signed)
Pt c/o generalized weakness, increasing SOB and increased abdominal and leg swelling x 2-3 days. Pt reports recent pacemeker insertion x 1 month ago.

## 2017-06-22 NOTE — H&P (Signed)
History and Physical    Donna Graves UJW:[id number] DOB: Mar 20, 1951 DOA: 06/22/2017  PCP: Wilson Singer, MD   Patient coming from: Home  Chief Complaint: SOB, swelling in legs and abdomen   HPI: Donna Graves is a 66 y.o. female with medical history significant for hypertension, type 2 diabetes mellitus, coronary artery disease, and chronic systolic CHF with AICD recently upgraded, now presenting to the emergency department with several days of worsening dyspnea and swelling. Patient reports that she had been in her usual state of health until approximately 10 days ago when she noted the insidious development of dyspnea with exertion. She is typically able to walk about 500 feet without shortness of breath, but is now having difficulty walking across a room. She hasn't noted a concomitant increase in her lower extremity edema, and also reports swelling into her abdomen. Denies chest pain, cough, or fevers.  ED Course: Upon arrival to the ED, patient is found to be afebrile, saturating adequately on room air, and with vitals otherwise stable. EKG features a paced rhythm and chest x-ray is notable for cardiomegaly and basilar atelectasis. Chemistry panel is unremarkable and CBC is notable for a chronic polycythemia. Troponin is undetectable and BNP is elevated to 379. Patient was treated with 80 mg IV Lasix in the ED. She remained hemodynamically stable, is not in acute restaurant distress while at rest, and will be admitted to the telemetry unit for ongoing evaluation and management of dyspnea and swelling, suspected secondary to acute on chronic systolic CHF.  Review of Systems:  All other systems reviewed and apart from HPI, are negative.  Past Medical History:  Diagnosis Date  . CHF (congestive heart failure) (HCC)   . Depression   . Diabetes mellitus without complication (HCC)   . 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 GERD  (gastroesophageal reflux disease)   . Gout   . Hyperlipidemia   . Hypertension   . Hypothyroidism     Past Surgical History:  Procedure Laterality Date  . ANAL FISSURE REPAIR    . BACK SURGERY    . BIV UPGRADE N/A 05/23/2017   Procedure: BiVI Upgrade;  Surgeon: Marinus Maw, MD;  Location: [facility] INVASIVE CV LAB;  Service: Cardiovascular;  Laterality: N/A;  . ICD IMPLANT  05/23/2017   biv  . left toe surgery    . pace maker       reports that she has never smoked. She has never used smokeless tobacco. She reports that she does not drink alcohol or use drugs.  Allergies  Allergen Reactions  . Codeine Rash    Family History  Problem Relation Age of Onset  . Diabetes Mother 58  . Heart attack Mother   . Hypertension Mother   . Diabetes Brother 58  . Arthritis Father 42     Prior to Admission medications   Medication Sig Start Date End Date Taking? Authorizing Provider  acetaminophen (TYLENOL) 650 MG CR tablet Take 650-1,300 mg by mouth every 8 (eight) hours as needed for pain.    [provider]  acidophilus (RISAQUAD) CAPS capsule Take 1 capsule by mouth daily. ULTIMATE FLORA PROBIOTIC    [provider]  carvedilol (COREG) 12.5 MG tablet TAKE 12.5 mg  BY MOUTH TWICE DAILY 03/01/17   [provider]  Cholecalciferol (VITAMIN D PO) Take 7,000 Units by mouth daily.    [provider]  dicyclomine (BENTYL) 10 MG capsule Take  10 mg by mouth 3 (three) times daily as needed for spasms.    [provider]  enalapril (VASOTEC) 10 MG tablet Take 10 mg by mouth 2 (two) times daily.     [provider]  furosemide (LASIX) 80 MG tablet Take 2 tablets (160 mg total) by mouth 2 (two) times daily. 04/01/17   Emokpae, Ejiroghene E, MD  glimepiride (AMARYL) 2 MG tablet Take 4 mg by mouth daily. BEFORE A MEAL    [provider]  magnesium oxide (MAG-OX) 400 MG tablet Take 400 mg by mouth daily.    [provider]    metolazone (ZAROXOLYN) 2.5 MG tablet Take 2.5mg  every Friday. Patient taking differently: Take 2.5 mg by mouth once a week. Take 2.5mg  every Friday. 04/07/17   Emokpae, Heloise Beecham, MD  Misc Natural Products (BLACK CHERRY CONCENTRATE PO) Take 2 tablets by mouth daily.     [provider]  Multiple Vitamins-Minerals (ALIVE ONCE DAILY WOMENS 50+ PO) Take 1 tablet by mouth daily.    [provider]  omeprazole (PRILOSEC) 20 MG capsule Take 20 mg by mouth every morning.    [provider]  ondansetron (ZOFRAN) 4 MG tablet Take 4 mg by mouth every 8 (eight) hours as needed for nausea. 05/06/17   [provider]  polycarbophil (FIBERCON) 625 MG tablet Take 625 mg by mouth 4 (four) times daily.    [provider]  potassium chloride SA (K-DUR,KLOR-CON) 20 MEQ tablet Take 20 mEq by mouth 2 (two) times daily.    [provider]  Red Yeast Rice Extract (RED YEAST RICE PO) Take 2 tablets by mouth daily.     [provider]  simvastatin (ZOCOR) 40 MG tablet Take 40 mg by mouth every evening.     [provider]    Physical Exam: Vitals:   06/22/17 1620 06/22/17 1621 06/22/17 2218  BP:  133/74 122/75  Pulse:  68 80  Resp:  18 19  Temp:  98.2 F (36.8 C)   TempSrc:  Oral   SpO2:  95% 100%  Weight: (!) 146.5 kg (323 lb)    Height:  (1.626 m)        Constitutional: NAD, calm, obese Eyes: PERTLA, lids and conjunctivae normal ENMT: Mucous membranes are moist. Posterior pharynx clear of any exudate or lesions.   Neck: normal, supple, no masses, no thyromegaly Respiratory: Increased WOB, fine rales. No pallor. No cyanosis.    Cardiovascular: S1 & S2 heard, regular rate and rhythm. Legs taut with edema bilaterally, up to hips. Abdomen: No distension, no tenderness, no masses palpated. Bowel sounds normal.  Musculoskeletal: no clubbing / cyanosis. No joint deformity upper and lower extremities.   Skin: no significant rashes,  lesions, ulcers. Warm, dry, well-perfused. Neurologic: CN 2-12 grossly intact. Sensation intact. Strength 5/5 in all 4 limbs.  Psychiatric: Alert and oriented x 3. Calm, cooperative.     Labs on Admission: I have personally reviewed following labs and imaging studies  CBC:  Recent Labs Lab 06/22/17 2149  WBC 6.1  HGB 16.6*  HCT 52.7*  MCV 93.9  PLT 179   Basic Metabolic Panel:  Recent Labs Lab 06/22/17 1652  NA 137  K 3.6  CL 97*  CO2 28  GLUCOSE 143*  BUN 15  CREATININE 0.72  CALCIUM 8.7*   GFR: Estimated Creatinine Clearance: 101.2 mL/min (by C-G formula based on SCr of 0.72 mg/dL). Liver Function Tests: No results for input(s): AST, ALT, ALKPHOS,  BILITOT, PROT, ALBUMIN in the last 168 hours. No results for input(s): LIPASE, AMYLASE in the last 168 hours. No results for input(s): AMMONIA in the last 168 hours. Coagulation Profile: No results for input(s): INR, PROTIME in the last 168 hours. Cardiac Enzymes:  Recent Labs Lab 06/22/17 1652  TROPONINI <0.03   BNP (last 3 results) No results for input(s): PROBNP in the last 8760 hours. HbA1C: No results for input(s): HGBA1C in the last 72 hours. CBG:  Recent Labs Lab 06/22/17 1625  GLUCAP 164*   Lipid Profile: No results for input(s): CHOL, HDL, LDLCALC, TRIG, CHOLHDL, LDLDIRECT in the last 72 hours. Thyroid Function Tests: No results for input(s): TSH, T4TOTAL, FREET4, T3FREE, THYROIDAB in the last 72 hours. Anemia Panel: No results for input(s): VITAMINB12, FOLATE, FERRITIN, TIBC, IRON, RETICCTPCT in the last 72 hours. Urine analysis:    Component Value Date/Time   COLORURINE YELLOW 03/30/2017 1946   APPEARANCEUR CLEAR 03/30/2017 1946   LABSPEC 1.014 03/30/2017 1946   PHURINE 7.0 03/30/2017 1946   GLUCOSEU NEGATIVE 03/30/2017 1946   HGBUR NEGATIVE 03/30/2017 1946   BILIRUBINUR NEGATIVE 03/30/2017 1946   KETONESUR NEGATIVE 03/30/2017 1946   PROTEINUR NEGATIVE 03/30/2017 1946   NITRITE  NEGATIVE 03/30/2017 1946   LEUKOCYTESUR NEGATIVE 03/30/2017 1946   Sepsis Labs: @LABRCNTIP (procalcitonin:4,lacticidven:4) )No results found for this or any previous visit (from the past 240 hour(s)).   Radiological Exams on Admission: Dg Chest 2 View  Result Date: 06/22/2017 CLINICAL DATA:  Shortness of breath and dry cough this week. History of CHF and pneumonia. EXAM: CHEST  2 VIEW COMPARISON:  Chest radiograph May 24, 2017 FINDINGS: Stable moderate cardiomegaly. Mediastinal silhouette is unremarkable for this low inspiratory examination with crowded vascular markings. Faint bibasilar strandy densities. No pleural effusion or focal consolidation. No pneumothorax. Three lead LEFT AICD in situ. Large body habitus. Osseous structures are nonsuspicious. IMPRESSION: Stable cardiomegaly, minimal bibasilar atelectasis. Electronically Signed   By: Awilda Metro M.D.   On: 06/22/2017 22:17    EKG: Independently reviewed. Dual-paced, low-voltage QRS.   Assessment/Plan  1. Acute on chronic systolic CHF  - Pt presents with several days of worsening SOB and edema, found to be in acute CHF  - Followed by cardiology, has AICD in place with recent upgrade   - Treated in ED with 80 mg IV Lasix  - Plan to continue cardiac monitoring, continue diuresis with Lasix 80 mg IV q12h, continue Coreg and enalapril, follow strict I/O's and daily wts, fluid-restrict diet and SLIV, follow daily chemistry panel  - Just had echo updated recently, will hold off on repeating for now    2. Type II DM  - A1c was 9.0% in July 2018  - Managed at home with glimepiride, held on admission  - Check CBG with meals and qHS  - Start a low-intensity SSI with Novolog    3. Hypertension  - BP is at goal  - Continue Coreg and enalapril    4. CAD - No anginal complaints, troponin undetectable  - Continue statin, beta-blocker, ACE-i   DVT prophylaxis: sq Lovenox Code Status: Full  Family Communication: Discussed  with patient Disposition Plan: Admit to telemetry Consults called: None Admission status: Inpatient    Briscoe Deutscher, MD [facility] Pager [phone]  If 7PM-7AM, please contact night-coverage [email or web address] Password [email or web address]  06/22/2017, 10:49 PM

## 2017-06-23 ENCOUNTER — Encounter: Payer: Self-pay | Admitting: Internal Medicine

## 2017-06-23 LAB — CBC
HEMATOCRIT: 49 % — AB (ref 36.0–46.0)
Hemoglobin: 15.5 g/dL — ABNORMAL HIGH (ref 12.0–15.0)
MCH: 29.6 pg (ref 26.0–34.0)
MCHC: 31.6 g/dL (ref 30.0–36.0)
MCV: 93.5 fL (ref 78.0–100.0)
Platelets: 167 10*3/uL (ref 150–400)
RBC: 5.24 MIL/uL — AB (ref 3.87–5.11)
RDW: 16.3 % — ABNORMAL HIGH (ref 11.5–15.5)
WBC: 5.9 10*3/uL (ref 4.0–10.5)

## 2017-06-23 LAB — BASIC METABOLIC PANEL
Anion gap: 11 (ref 5–15)
BUN: 14 mg/dL (ref 6–20)
CHLORIDE: 98 mmol/L — AB (ref 101–111)
CO2: 29 mmol/L (ref 22–32)
CREATININE: 0.66 mg/dL (ref 0.44–1.00)
Calcium: 8.6 mg/dL — ABNORMAL LOW (ref 8.9–10.3)
GFR calc Af Amer: >60 mL/min (ref >60)
GFR calc non Af Amer: >60 mL/min (ref >60)
GLUCOSE: 118 mg/dL — AB (ref 65–99)
POTASSIUM: 3.2 mmol/L — AB (ref 3.5–5.1)
SODIUM: 138 mmol/L (ref 135–145)

## 2017-06-23 LAB — GLUCOSE, CAPILLARY
GLUCOSE-CAPILLARY: 107 mg/dL — AB (ref 65–99)
Glucose-Capillary: 165 mg/dL — ABNORMAL HIGH (ref 65–99)
Glucose-Capillary: 106 mg/dL — ABNORMAL HIGH (ref 65–99)
Glucose-Capillary: 195 mg/dL — ABNORMAL HIGH (ref 65–99)

## 2017-06-23 MED ORDER — POTASSIUM CHLORIDE CRYS ER 20 MEQ PO TBCR
40.0000 meq | EXTENDED_RELEASE_TABLET | Freq: Once | ORAL | Status: AC
  Administered 2017-06-23: 40 meq via ORAL
  Filled 2017-06-23: qty 2

## 2017-06-23 MED ORDER — METOLAZONE 5 MG PO TABS
5.0000 mg | ORAL_TABLET | Freq: Two times a day (BID) | ORAL | Status: DC
  Administered 2017-06-23 – 2017-06-26 (×6): 5 mg via ORAL
  Filled 2017-06-23 (×6): qty 1

## 2017-06-23 MED ORDER — POTASSIUM CHLORIDE CRYS ER 20 MEQ PO TBCR: 40.0000 meq | EXTENDED_RELEASE_TABLET | ORAL | Status: DC

## 2017-06-23 MED ORDER — ENOXAPARIN SODIUM 80 MG/0.8ML ~~LOC~~ SOLN
80.0000 mg | SUBCUTANEOUS | Status: DC
  Administered 2017-06-23 – 2017-06-30 (×8): 80 mg via SUBCUTANEOUS
  Filled 2017-06-23 (×8): qty 0.8

## 2017-06-23 NOTE — Progress Notes (Signed)
PROGRESS NOTE    Donna Graves  OEU:[id number] DOB: 04-24-1951 DOA: 06/22/2017 PCP: Wilson Singer, MD   Brief Narrative:  66 year old female with a history of hypertension, diabetes, coronary artery disease and chronic systolic congestive heart failure with ejection fraction of 20-30%, admitted to the hospital with worsening shortness of breath and lower extremity edema consistent with decompensated CHF. She was admitted for IV diuresis.   Assessment & Plan:   Principal Problem:   Acute on chronic systolic CHF (congestive heart failure) (HCC) Active Problems:   Type II diabetes mellitus (HCC)   Essential hypertension   CAD (coronary artery disease)   1. Acute on chronic systolic CHF. Patient presents with massive volume overload. She is on intravenous Lasix, 80 mg twice a day. Urine output has been unimpressive. Will add metolazone. Ejection fraction of 25-30% on last echocardiogram. Continue monitor intake and output. 2. Type 2 diabetes. Blood sugars currently stable. Continue sliding scale insulin. 3. Hypertension. Blood pressure is currently stable. Continue Coreg and enalapril. 4. Coronary artery disease. No complaints of chest pain. Troponin negative. Continue on statin, beta blocker and ACE inhibitor.   DVT prophylaxis: Lovenox Code Status: Full code Family Communication: No family present Disposition Plan: Discharge home once improved   Consultants:     Procedures:     Antimicrobials:      Subjective: Shortness of breath mildly improved since yesterday. Still complains of significant edema.  Objective: Vitals:   06/22/17 2252 06/22/17 2352 06/23/17 0536 06/23/17 1321  BP: 129/73 119/71  (!) 99/51  Pulse: 91 82 72 71  Resp: 18 20 17 18   Temp:  98.5 F (36.9 C) 98 F (36.7 C) 98.3 F (36.8 C)  TempSrc:  Oral  Oral  SpO2: 95% 93% 94% 93%  Weight:  (!) 162.6 kg (358 lb 8 oz) (!) 163.1 kg (359 lb 9.1 oz)   Height:        Intake/Output Summary  (Last 24 hours) at 06/23/17 1822 Last data filed at 06/23/17 1300  Gross per 24 hour  Intake              480 ml  Output             1250 ml  Net             -770 ml   Filed Weights   06/22/17 1620 06/22/17 2352 06/23/17 0536  Weight: (!) 146.5 kg (323 lb) (!) 162.6 kg (358 lb 8 oz) (!) 163.1 kg (359 lb 9.1 oz)    Examination:  General exam: Appears calm and comfortable  Respiratory system: bilateral crackles. Respiratory effort normal. Cardiovascular system: S1 & S2 heard, RRR. No JVD, murmurs, rubs, gallops or clicks. 2+ pedal edema. Gastrointestinal system: Abdomen is nondistended, soft and nontender. No organomegaly or masses felt. Normal bowel sounds heard. +abdominal wall edema Central nervous system: Alert and oriented. No focal neurological deficits. Extremities: Symmetric 5 x 5 power. Skin: No rashes, lesions or ulcers Psychiatry: Judgement and insight appear normal. Mood & affect appropriate.     Data Reviewed: I have personally reviewed following labs and imaging studies  CBC:  Recent Labs Lab 06/22/17 2149 06/23/17 0502  WBC 6.1 5.9  HGB 16.6* 15.5*  HCT 52.7* 49.0*  MCV 93.9 93.5  PLT 179 167   Basic Metabolic Panel:  Recent Labs Lab 06/22/17 1652 06/23/17 0502  NA 137 138  K 3.6 3.2*  CL 97* 98*  CO2 28 29  GLUCOSE 143* 118*  BUN 15 14  CREATININE 0.72 0.66  CALCIUM 8.7* 8.6*   GFR: Estimated Creatinine Clearance: 108.6 mL/min (by C-G formula based on SCr of 0.66 mg/dL). Liver Function Tests: No results for input(s): AST, ALT, ALKPHOS, BILITOT, PROT, ALBUMIN in the last 168 hours. No results for input(s): LIPASE, AMYLASE in the last 168 hours. No results for input(s): AMMONIA in the last 168 hours. Coagulation Profile: No results for input(s): INR, PROTIME in the last 168 hours. Cardiac Enzymes:  Recent Labs Lab 06/22/17 1652  TROPONINI <0.03   BNP (last 3 results) No results for input(s): PROBNP in the last 8760 hours. HbA1C: No  results for input(s): HGBA1C in the last 72 hours. CBG:  Recent Labs Lab 06/22/17 1625 06/23/17 0754 06/23/17 1058 06/23/17 1637  GLUCAP 164* 107* 165* 106*   Lipid Profile: No results for input(s): CHOL, HDL, LDLCALC, TRIG, CHOLHDL, LDLDIRECT in the last 72 hours. Thyroid Function Tests: No results for input(s): TSH, T4TOTAL, FREET4, T3FREE, THYROIDAB in the last 72 hours. Anemia Panel: No results for input(s): VITAMINB12, FOLATE, FERRITIN, TIBC, IRON, RETICCTPCT in the last 72 hours. Sepsis Labs: No results for input(s): PROCALCITON, LATICACIDVEN in the last 168 hours.  No results found for this or any previous visit (from the past 240 hour(s)).       Radiology Studies: Dg Chest 2 View  Result Date: 06/22/2017 CLINICAL DATA:  Shortness of breath and dry cough this week. History of CHF and pneumonia. EXAM: CHEST  2 VIEW COMPARISON:  Chest radiograph May 24, 2017 FINDINGS: Stable moderate cardiomegaly. Mediastinal silhouette is unremarkable for this low inspiratory examination with crowded vascular markings. Faint bibasilar strandy densities. No pleural effusion or focal consolidation. No pneumothorax. Three lead LEFT AICD in situ. Large body habitus. Osseous structures are nonsuspicious. IMPRESSION: Stable cardiomegaly, minimal bibasilar atelectasis. Electronically Signed   By: Awilda Metro M.D.   On: 06/22/2017 22:17        Scheduled Meds: . acidophilus  1 capsule Oral Daily  . carvedilol  12.5 mg Oral BID WC  . enalapril  10 mg Oral BID  . enoxaparin (LOVENOX) injection  80 mg Subcutaneous Q24H  . furosemide  80 mg Intravenous Q12H  . insulin aspart  0-5 Units Subcutaneous QHS  . insulin aspart  0-9 Units Subcutaneous TID WC  . magnesium oxide  400 mg Oral Daily  . metolazone  5 mg Oral BID  . multivitamin with minerals  1 tablet Oral Daily  . pantoprazole  40 mg Oral Daily  . potassium chloride SA  20 mEq Oral BID  . potassium chloride  40 mEq Oral Once    . simvastatin  40 mg Oral q1800  . sodium chloride flush  3 mL Intravenous Q12H   Continuous Infusions: . sodium chloride       LOS: 1 day    Time spent:    Barton Want, MD [facility] Pager [phone]  If 7PM-7AM, please contact night-coverage [email or web address] Password [email or web address] 06/23/2017, 6:22 PM

## 2017-06-23 NOTE — Care Management Note (Signed)
Case Management Note  Patient Details  Name: Donna Graves MRN: [id number] Date of Birth: 06/17/1951  Subjective/Objective:                  Admitted with CHF. Pt form home, lives with husband, ind with ADL's. Has PCP, transportation and insurance with drug coverage. Has scale and reports compliance with diet. Communicates no needs or concerns about DC plan.   Action/Plan: DC home with self care. No CM needs noted at this time.   Expected Discharge Date:     06/24/2017             Expected Discharge Plan:  Home/Self Care  In-House Referral:  NA  Discharge planning Services  CM Consult  Post Acute Care Choice:  NA Choice offered to:  NA  Status of Service:  Completed, signed off  Malcolm Metro, RN 06/23/2017, 1:28 PM

## 2017-06-23 NOTE — Care Management Important Message (Signed)
Important Message  Patient Details  Name: Donna Graves MRN: [id number] Date of Birth: May 03, 1951   Medicare Important Message Given:  Yes    Malcolm Metro, RN 06/23/2017, 1:28 PM

## 2017-06-24 LAB — BASIC METABOLIC PANEL
ANION GAP: 11 (ref 5–15)
BUN: 14 mg/dL (ref 6–20)
CALCIUM: 8.8 mg/dL — AB (ref 8.9–10.3)
CO2: 27 mmol/L (ref 22–32)
Chloride: 102 mmol/L (ref 101–111)
Creatinine, Ser: 0.78 mg/dL (ref 0.44–1.00)
GLUCOSE: 139 mg/dL — AB (ref 65–99)
POTASSIUM: 3.7 mmol/L (ref 3.5–5.1)
Sodium: 140 mmol/L (ref 135–145)

## 2017-06-24 LAB — GLUCOSE, CAPILLARY
GLUCOSE-CAPILLARY: 168 mg/dL — AB (ref 65–99)
GLUCOSE-CAPILLARY: 195 mg/dL — AB (ref 65–99)
GLUCOSE-CAPILLARY: 186 mg/dL — AB (ref 65–99)
Glucose-Capillary: 130 mg/dL — ABNORMAL HIGH (ref 65–99)

## 2017-06-24 NOTE — Progress Notes (Signed)
PROGRESS NOTE    Donna Graves  WUJ:[id number] DOB: September 09, 1951 DOA: 06/22/2017 PCP: Wilson Singer, MD   Brief Narrative:  66 year old female with a history of hypertension, diabetes, coronary artery disease and chronic systolic congestive heart failure with ejection fraction of 20-30%, admitted to the hospital with worsening shortness of breath and lower extremity edema consistent with decompensated CHF. She was admitted for IV diuresis.   Assessment & Plan:   Principal Problem:   Acute on chronic systolic CHF (congestive heart failure) (HCC) Active Problems:   Type II diabetes mellitus (HCC)   Essential hypertension   CAD (coronary artery disease)   1. Acute on chronic systolic CHF. Patient presents with massive volume overload. She is on intravenous Lasix, 80 mg twice a day as well as metolazone. Urine output improving. Ejection fraction of 25-30% on last echocardiogram. Continue monitor intake and output. Net fluid balance -2.2L since admission 2. Type 2 diabetes. Blood sugars currently stable. Continue sliding scale insulin. 3. Hypertension. Blood pressure is currently stable. Continue Coreg and enalapril. 4. Coronary artery disease. No complaints of chest pain. Troponin negative. Continue on statin, beta blocker and ACE inhibitor.   DVT prophylaxis: Lovenox Code Status: Full code Family Communication: No family present Disposition Plan: Discharge home once improved   Consultants:     Procedures:     Antimicrobials:      Subjective: Feels that breathing is mildly better today. Abdomen still feels tight  Objective: Vitals:   06/23/17 0536 06/23/17 1321 06/23/17 1953 06/24/17 0620  BP:  (!) 99/51 100/67 98/65  Pulse: 72 71 70 69  Resp: Temp: 98 F (36.7 C) 98.3 F (36.8 C) 97.8 F (36.6 C) 97.7 F (36.5 C)  TempSrc:  Oral Oral Oral  SpO2: 94% 93% 94% 98%  Weight: (!) 163.1 kg (359 lb 9.1 oz)   (!) 162.4 kg (358 lb 1.6 oz)  Height:         Intake/Output Summary (Last 24 hours) at 06/24/17 1425 Last data filed at 06/24/17 0450  Gross per 24 hour  Intake                3 ml  Output             1500 ml  Net            -1497 ml   Filed Weights   06/22/17 2352 06/23/17 0536 06/24/17 0620  Weight: (!) 162.6 kg (358 lb 8 oz) (!) 163.1 kg (359 lb 9.1 oz) (!) 162.4 kg (358 lb 1.6 oz)    Examination:  General exam: Appears calm and comfortable  Respiratory system: bilateral crackles. Respiratory effort normal. Cardiovascular system: S1 & S2 heard, RRR. No JVD, murmurs, rubs, gallops or clicks. 2+ pedal edema. Gastrointestinal system: Abdomen is nondistended, soft and nontender. No organomegaly or masses felt. Normal bowel sounds heard. +abdominal wall edema Central nervous system: Alert and oriented. No focal neurological deficits. Extremities: Symmetric 5 x 5 power. Skin: No rashes, lesions or ulcers Psychiatry: Judgement and insight appear normal. Mood & affect appropriate.     Data Reviewed: I have personally reviewed following labs and imaging studies  CBC:  Recent Labs Lab 06/22/17 2149 06/23/17 0502  WBC 6.1 5.9  HGB 16.6* 15.5*  HCT 52.7* 49.0*  MCV 93.9 93.5  PLT 179 167   Basic Metabolic Panel:  Recent Labs Lab 06/22/17 1652 06/23/17 0502 06/24/17 0630  NA 137 138 140  K 3.6 3.2*  3.7  CL 97* 98* 102  CO2 28 29 27   GLUCOSE 143* 118* 139*  BUN 15 14 14   CREATININE 0.72 0.66 0.78  CALCIUM 8.7* 8.6* 8.8*   GFR: Estimated Creatinine Clearance: 108.2 mL/min (by C-G formula based on SCr of 0.78 mg/dL). Liver Function Tests: No results for input(s): AST, ALT, ALKPHOS, BILITOT, PROT, ALBUMIN in the last 168 hours. No results for input(s): LIPASE, AMYLASE in the last 168 hours. No results for input(s): AMMONIA in the last 168 hours. Coagulation Profile: No results for input(s): INR, PROTIME in the last 168 hours. Cardiac Enzymes:  Recent Labs Lab 06/22/17 1652  TROPONINI <0.03   BNP  (last 3 results) No results for input(s): PROBNP in the last 8760 hours. HbA1C: No results for input(s): HGBA1C in the last 72 hours. CBG:  Recent Labs Lab 06/23/17 1058 06/23/17 1637 06/23/17 1951 06/24/17 0737 06/24/17 1143  GLUCAP 165* 106* 195* 130* 168*   Lipid Profile: No results for input(s): CHOL, HDL, LDLCALC, TRIG, CHOLHDL, LDLDIRECT in the last 72 hours. Thyroid Function Tests: No results for input(s): TSH, T4TOTAL, FREET4, T3FREE, THYROIDAB in the last 72 hours. Anemia Panel: No results for input(s): VITAMINB12, FOLATE, FERRITIN, TIBC, IRON, RETICCTPCT in the last 72 hours. Sepsis Labs: No results for input(s): PROCALCITON, LATICACIDVEN in the last 168 hours.  No results found for this or any previous visit (from the past 240 hour(s)).       Radiology Studies: Dg Chest 2 View  Result Date: 06/22/2017 CLINICAL DATA:  Shortness of breath and dry cough this week. History of CHF and pneumonia. EXAM: CHEST  2 VIEW COMPARISON:  Chest radiograph May 24, 2017 FINDINGS: Stable moderate cardiomegaly. Mediastinal silhouette is unremarkable for this low inspiratory examination with crowded vascular markings. Faint bibasilar strandy densities. No pleural effusion or focal consolidation. No pneumothorax. Three lead LEFT AICD in situ. Large body habitus. Osseous structures are nonsuspicious. IMPRESSION: Stable cardiomegaly, minimal bibasilar atelectasis. Electronically Signed   By: Awilda Metro M.D.   On: 06/22/2017 22:17        Scheduled Meds: . acidophilus  1 capsule Oral Daily  . carvedilol  12.5 mg Oral BID WC  . enalapril  10 mg Oral BID  . enoxaparin (LOVENOX) injection  80 mg Subcutaneous Q24H  . furosemide  80 mg Intravenous Q12H  . insulin aspart  0-5 Units Subcutaneous QHS  . insulin aspart  0-9 Units Subcutaneous TID WC  . magnesium oxide  400 mg Oral Daily  . metolazone  5 mg Oral BID  . multivitamin with minerals  1 tablet Oral Daily  .  pantoprazole  40 mg Oral Daily  . potassium chloride SA  20 mEq Oral BID  . simvastatin  40 mg Oral q1800  . sodium chloride flush  3 mL Intravenous Q12H   Continuous Infusions: . sodium chloride       LOS: 2 days    Time spent:    MEMON,JEHANZEB, MD [facility] Pager [phone]  If 7PM-7AM, please contact night-coverage [email or web address] Password [email or web address] 06/24/2017, 2:25 PM

## 2017-06-25 LAB — GLUCOSE, CAPILLARY
GLUCOSE-CAPILLARY: 166 mg/dL — AB (ref 65–99)
Glucose-Capillary: 142 mg/dL — ABNORMAL HIGH (ref 65–99)
Glucose-Capillary: 176 mg/dL — ABNORMAL HIGH (ref 65–99)
Glucose-Capillary: 161 mg/dL — ABNORMAL HIGH (ref 65–99)

## 2017-06-25 LAB — BASIC METABOLIC PANEL
Anion gap: 12 (ref 5–15)
BUN: 15 mg/dL (ref 6–20)
CHLORIDE: 95 mmol/L — AB (ref 101–111)
CO2: 29 mmol/L (ref 22–32)
CREATININE: 0.83 mg/dL (ref 0.44–1.00)
Calcium: 9.2 mg/dL (ref 8.9–10.3)
GFR calc non Af Amer: >60 mL/min (ref >60)
GLUCOSE: 146 mg/dL — AB (ref 65–99)
Potassium: 3.3 mmol/L — ABNORMAL LOW (ref 3.5–5.1)
Sodium: 136 mmol/L (ref 135–145)

## 2017-06-25 MED ORDER — POTASSIUM CHLORIDE CRYS ER 20 MEQ PO TBCR
40.0000 meq | EXTENDED_RELEASE_TABLET | ORAL | Status: AC
  Administered 2017-06-25 (×2): 40 meq via ORAL

## 2017-06-25 MED ORDER — GLUCOSE 40 % PO GEL
ORAL | Status: AC
  Filled 2017-06-25: qty 1

## 2017-06-25 NOTE — Progress Notes (Signed)
PROGRESS NOTE    Donna Graves  [id number] DOB: 02-Mar-1951 DOA: 06/22/2017 PCP: Wilson Singer, MD   Brief Narrative:  66 year old female with a history of hypertension, diabetes, coronary artery disease and chronic systolic congestive heart failure with ejection fraction of 20-30%, admitted to the hospital with worsening shortness of breath and lower extremity edema consistent with decompensated CHF. She was admitted for IV diuresis.   Assessment & Plan:   Principal Problem:   Acute on chronic systolic CHF (congestive heart failure) (HCC) Active Problems:   Type II diabetes mellitus (HCC)   Essential hypertension   CAD (coronary artery disease)   1. Acute on chronic systolic CHF. Patient presents with massive volume overload. She is on intravenous Lasix, 80 mg twice a day as well as metolazone. Urine output improving. Ejection fraction of 25-30% on last echocardiogram. Continue monitor intake and output. Net fluid balance -7.9L since admission. She needs continued IV diuresis 2. Type 2 diabetes. Blood sugars currently stable. Continue sliding scale insulin. 3. Hypertension. Blood pressure is currently stable. Continue Coreg and enalapril. 4. Coronary artery disease. No complaints of chest pain. Troponin negative. Continue on statin, beta blocker and ACE inhibitor. 5. Hypokalemia. replace   DVT prophylaxis: Lovenox Code Status: Full code Family Communication: No family present Disposition Plan: Discharge home once improved   Consultants:     Procedures:     Antimicrobials:      Subjective: Abdomen still feels tight. Feels breathing is slowly improving  Objective: Vitals:   06/24/17 0620 06/24/17 1520 06/24/17 2057 06/25/17 0425  BP: 98/65 (!) 111/50 118/67 (!) 102/58  Pulse: 69 71 70 74  Resp: Temp: 97.7 F (36.5 C) 97.6 F (36.4 C) 97.9 F (36.6 C) 97.9 F (36.6 C)  TempSrc: Oral  Oral Oral  SpO2: 98% 94% 95% 94%  Weight: (!)  162.4 kg (358 lb 1.6 oz)   (!) 158.3 kg (348 lb 14.4 oz)  Height:    (1.626 m)  (1.626 m)    Intake/Output Summary (Last 24 hours) at 06/25/17 1551 Last data filed at 06/25/17 0838  Gross per 24 hour  Intake              240 ml  Output             5400 ml  Net            -5160 ml   Filed Weights   06/23/17 0536 06/24/17 0620 06/25/17 0425  Weight: (!) 163.1 kg (359 lb 9.1 oz) (!) 162.4 kg (358 lb 1.6 oz) (!) 158.3 kg (348 lb 14.4 oz)    Examination:  General exam: Appears calm and comfortable  Respiratory system: bilateral crackles. Respiratory effort normal. Cardiovascular system: S1 & S2 heard, RRR. No JVD, murmurs, rubs, gallops or clicks. 2+ pedal edema. Gastrointestinal system: Abdomen is distended, soft and nontender. No organomegaly or masses felt. Normal bowel sounds heard. +abdominal wall edema Central nervous system: Alert and oriented. No focal neurological deficits. Extremities: Symmetric 5 x 5 power. Skin: No rashes, lesions or ulcers Psychiatry: Judgement and insight appear normal. Mood & affect appropriate.     Data Reviewed: I have personally reviewed following labs and imaging studies  CBC:  Recent Labs Lab 06/22/17 2149 06/23/17 0502  WBC 6.1 5.9  HGB 16.6* 15.5*  HCT 52.7* 49.0*  MCV 93.9 93.5  PLT 179 167   Basic Metabolic Panel:  Recent Labs Lab 06/22/17 1652 06/23/17 0502  06/24/17 0630 06/25/17 0443  NA 137 138 140 136  K 3.6 3.2* 3.7 3.3*  CL 97* 98* 102 95*  CO2 28 29 27 29   GLUCOSE 143* 118* 139* 146*  BUN 15 14 14 15   CREATININE 0.72 0.66 0.78 0.83  CALCIUM 8.7* 8.6* 8.8* 9.2   GFR: Estimated Creatinine Clearance: 102.5 mL/min (by C-G formula based on SCr of 0.83 mg/dL). Liver Function Tests: No results for input(s): AST, ALT, ALKPHOS, BILITOT, PROT, ALBUMIN in the last 168 hours. No results for input(s): LIPASE, AMYLASE in the last 168 hours. No results for input(s): AMMONIA in the last 168 hours. Coagulation  Profile: No results for input(s): INR, PROTIME in the last 168 hours. Cardiac Enzymes:  Recent Labs Lab 06/22/17 1652  TROPONINI <0.03   BNP (last 3 results) No results for input(s): PROBNP in the last 8760 hours. HbA1C: No results for input(s): HGBA1C in the last 72 hours. CBG:  Recent Labs Lab 06/24/17 1143 06/24/17 1649 06/24/17 2109 06/25/17 0733 06/25/17 1127  GLUCAP 168* 195* 186* 142* 176*   Lipid Profile: No results for input(s): CHOL, HDL, LDLCALC, TRIG, CHOLHDL, LDLDIRECT in the last 72 hours. Thyroid Function Tests: No results for input(s): TSH, T4TOTAL, FREET4, T3FREE, THYROIDAB in the last 72 hours. Anemia Panel: No results for input(s): VITAMINB12, FOLATE, FERRITIN, TIBC, IRON, RETICCTPCT in the last 72 hours. Sepsis Labs: No results for input(s): PROCALCITON, LATICACIDVEN in the last 168 hours.  No results found for this or any previous visit (from the past 240 hour(s)).       Radiology Studies: No results found.      Scheduled Meds: . acidophilus  1 capsule Oral Daily  . carvedilol  12.5 mg Oral BID WC  . enalapril  10 mg Oral BID  . enoxaparin (LOVENOX) injection  80 mg Subcutaneous Q24H  . furosemide  80 mg Intravenous Q12H  . insulin aspart  0-5 Units Subcutaneous QHS  . insulin aspart  0-9 Units Subcutaneous TID WC  . magnesium oxide  400 mg Oral Daily  . metolazone  5 mg Oral BID  . multivitamin with minerals  1 tablet Oral Daily  . pantoprazole  40 mg Oral Daily  . potassium chloride SA  20 mEq Oral BID  . simvastatin  40 mg Oral q1800  . sodium chloride flush  3 mL Intravenous Q12H   Continuous Infusions: . sodium chloride       LOS: 3 days    Time spent:    MEMON,JEHANZEB, MD [facility] Pager [phone]  If 7PM-7AM, please contact night-coverage [email or web address] Password [email or web address] 06/25/2017, 3:51 PM

## 2017-06-26 LAB — GLUCOSE, CAPILLARY
GLUCOSE-CAPILLARY: 146 mg/dL — AB (ref 65–99)
GLUCOSE-CAPILLARY: 161 mg/dL — AB (ref 65–99)
Glucose-Capillary: 146 mg/dL — ABNORMAL HIGH (ref 65–99)
Glucose-Capillary: 175 mg/dL — ABNORMAL HIGH (ref 65–99)
Glucose-Capillary: 175 mg/dL — ABNORMAL HIGH (ref 65–99)

## 2017-06-26 LAB — BASIC METABOLIC PANEL
ANION GAP: 12 (ref 5–15)
BUN: 19 mg/dL (ref 6–20)
CHLORIDE: 92 mmol/L — AB (ref 101–111)
CO2: 31 mmol/L (ref 22–32)
Calcium: 9.2 mg/dL (ref 8.9–10.3)
Creatinine, Ser: 0.96 mg/dL (ref 0.44–1.00)
GFR calc non Af Amer: >60 mL/min (ref >60)
Glucose, Bld: 133 mg/dL — ABNORMAL HIGH (ref 65–99)
POTASSIUM: 3.3 mmol/L — AB (ref 3.5–5.1)
SODIUM: 135 mmol/L (ref 135–145)

## 2017-06-26 MED ORDER — METOLAZONE 5 MG PO TABS
5.0000 mg | ORAL_TABLET | Freq: Every day | ORAL | Status: DC
  Administered 2017-06-27: 5 mg via ORAL
  Filled 2017-06-26: qty 1

## 2017-06-26 MED ORDER — POTASSIUM CHLORIDE CRYS ER 20 MEQ PO TBCR
40.0000 meq | EXTENDED_RELEASE_TABLET | ORAL | Status: AC
  Administered 2017-06-26 (×2): 40 meq via ORAL
  Filled 2017-06-26 (×2): qty 2

## 2017-06-26 NOTE — Progress Notes (Signed)
PROGRESS NOTE    Donna Graves  DCV:[id number] DOB: 1951-05-24 DOA: 06/22/2017 PCP: Wilson Singer, MD   Brief Narrative:  66 year old female with a history of hypertension, diabetes, coronary artery disease and chronic systolic congestive heart failure with ejection fraction of 20-30%, admitted to the hospital with worsening shortness of breath and lower extremity edema consistent with decompensated CHF. She was admitted for IV diuresis.   Assessment & Plan:   Principal Problem:   Acute on chronic systolic CHF (congestive heart failure) (HCC) Active Problems:   Type II diabetes mellitus (HCC)   Essential hypertension   CAD (coronary artery disease)   1. Acute on chronic systolic CHF. Patient presents with massive volume overload. She is on intravenous Lasix, 80 mg twice a day as well as metolazone. Ejection fraction of 25-30% on last echocardiogram. Continue monitor intake and output. Net fluid balance -16.6L since admission. Weight on admission was 359 pounds. Weight this morning was noted to be 336 pounds. She needs continued IV diuresis. Since she had 7 L of urine output yesterday, will change metolazone to 5 mg once daily. 2. Type 2 diabetes. Blood sugars currently stable. Continue sliding scale insulin. 3. Hypertension. Blood pressure is currently stable. Continue Coreg and enalapril. 4. Coronary artery disease. No complaints of chest pain. Troponin negative. Continue on statin, beta blocker and ACE inhibitor. 5. Hypokalemia. replace   DVT prophylaxis: Lovenox Code Status: Full code Family Communication: No family present Disposition Plan: Discharge home once improved   Consultants:     Procedures:     Antimicrobials:      Subjective: Overall swelling improving as is breathing. Still has significant abdominal wall edema.  Objective: Vitals:   06/25/17 2048 06/26/17 0600 06/26/17 1100 06/26/17 1300  BP: 104/64 115/62 (!) 99/53 (!) 109/56  Pulse: 70 70  69 70  Resp: 18 18  20   Temp: 97.9 F (36.6 C) 98 F (36.7 C)  97.6 F (36.4 C)  TempSrc: Oral Oral  Oral  SpO2: 94% 96% 95% 93%  Weight:  (!) 152.8 kg (336 lb 14.4 oz)    Height:        Intake/Output Summary (Last 24 hours) at 06/26/17 1536 Last data filed at 06/26/17 1300  Gross per 24 hour  Intake              723 ml  Output             7300 ml  Net            -6577 ml   Filed Weights   06/24/17 0620 06/25/17 0425 06/26/17 0600  Weight: (!) 162.4 kg (358 lb 1.6 oz) (!) 158.3 kg (348 lb 14.4 oz) (!) 152.8 kg (336 lb 14.4 oz)    Examination:  General exam: Appears calm and comfortable  Respiratory system: bilateral crackles at bases. Respiratory effort normal. Cardiovascular system: S1 & S2 heard, RRR. No JVD, murmurs, rubs, gallops or clicks. 1+ pedal edema. Gastrointestinal system: Abdomen is distended, soft and nontender. No organomegaly or masses felt. Normal bowel sounds heard. +abdominal wall edema Central nervous system: Alert and oriented. No focal neurological deficits. Extremities: Symmetric 5 x 5 power. Skin: No rashes, lesions or ulcers Psychiatry: Judgement and insight appear normal. Mood & affect appropriate.     Data Reviewed: I have personally reviewed following labs and imaging studies  CBC:  Recent Labs Lab 06/22/17 2149 06/23/17 0502  WBC 6.1 5.9  HGB 16.6* 15.5*  HCT 52.7* 49.0*  MCV  93.9 93.5  PLT 179 167   Basic Metabolic Panel:  Recent Labs Lab 06/22/17 1652 06/23/17 0502 06/24/17 0630 06/25/17 0443 06/26/17 0442  NA 137 138 140 136 135  K 3.6 3.2* 3.7 3.3* 3.3*  CL 97* 98* 102 95* 92*  CO2 GLUCOSE 143* 118* 139* 146* 133*  BUN CREATININE 0.72 0.66 0.78 0.83 0.96  CALCIUM 8.7* 8.6* 8.8* 9.2 9.2   GFR: Estimated Creatinine Clearance: 86.6 mL/min (by C-G formula based on SCr of 0.96 mg/dL). Liver Function Tests: No results for input(s): AST, ALT, ALKPHOS, BILITOT, PROT, ALBUMIN in the last  168 hours. No results for input(s): LIPASE, AMYLASE in the last 168 hours. No results for input(s): AMMONIA in the last 168 hours. Coagulation Profile: No results for input(s): INR, PROTIME in the last 168 hours. Cardiac Enzymes:  Recent Labs Lab 06/22/17 1652  TROPONINI <0.03   BNP (last 3 results) No results for input(s): PROBNP in the last 8760 hours. HbA1C: No results for input(s): HGBA1C in the last 72 hours. CBG:  Recent Labs Lab 06/25/17 1626 06/25/17 2052 06/26/17 0732 06/26/17 1134 06/26/17 1204  GLUCAP 161* 166* 146* 175* 146*   Lipid Profile: No results for input(s): CHOL, HDL, LDLCALC, TRIG, CHOLHDL, LDLDIRECT in the last 72 hours. Thyroid Function Tests: No results for input(s): TSH, T4TOTAL, FREET4, T3FREE, THYROIDAB in the last 72 hours. Anemia Panel: No results for input(s): VITAMINB12, FOLATE, FERRITIN, TIBC, IRON, RETICCTPCT in the last 72 hours. Sepsis Labs: No results for input(s): PROCALCITON, LATICACIDVEN in the last 168 hours.  No results found for this or any previous visit (from the past 240 hour(s)).       Radiology Studies: No results found.      Scheduled Meds: . acidophilus  1 capsule Oral Daily  . carvedilol  12.5 mg Oral BID WC  . enalapril  10 mg Oral BID  . enoxaparin (LOVENOX) injection  80 mg Subcutaneous Q24H  . furosemide  80 mg Intravenous Q12H  . insulin aspart  0-5 Units Subcutaneous QHS  . insulin aspart  0-9 Units Subcutaneous TID WC  . magnesium oxide  400 mg Oral Daily  . metolazone  5 mg Oral BID  . multivitamin with minerals  1 tablet Oral Daily  . pantoprazole  40 mg Oral Daily  . potassium chloride SA  20 mEq Oral BID  . potassium chloride  40 mEq Oral Q3H  . simvastatin  40 mg Oral q1800  . sodium chloride flush  3 mL Intravenous Q12H   Continuous Infusions: . sodium chloride       LOS: 4 days    Time spent:    MEMON,JEHANZEB, MD [facility] Pager [phone]  If 7PM-7AM,  please contact night-coverage [email or web address] Password [email or web address] 06/26/2017, 3:36 PM

## 2017-06-27 LAB — BASIC METABOLIC PANEL
Anion gap: 14 (ref 5–15)
BUN: 20 mg/dL (ref 6–20)
CHLORIDE: 90 mmol/L — AB (ref 101–111)
CO2: 30 mmol/L (ref 22–32)
CREATININE: 0.93 mg/dL (ref 0.44–1.00)
Calcium: 9.3 mg/dL (ref 8.9–10.3)
GFR calc Af Amer: >60 mL/min (ref >60)
GFR calc non Af Amer: >60 mL/min (ref >60)
Glucose, Bld: 149 mg/dL — ABNORMAL HIGH (ref 65–99)
Potassium: 3.6 mmol/L (ref 3.5–5.1)
SODIUM: 134 mmol/L — AB (ref 135–145)

## 2017-06-27 LAB — GLUCOSE, CAPILLARY
GLUCOSE-CAPILLARY: 206 mg/dL — AB (ref 65–99)
Glucose-Capillary: 153 mg/dL — ABNORMAL HIGH (ref 65–99)
Glucose-Capillary: 200 mg/dL — ABNORMAL HIGH (ref 65–99)
Glucose-Capillary: 190 mg/dL — ABNORMAL HIGH (ref 65–99)

## 2017-06-27 MED ORDER — TRAMADOL HCL 50 MG PO TABS
50.0000 mg | ORAL_TABLET | Freq: Four times a day (QID) | ORAL | Status: DC | PRN
  Administered 2017-06-27: 50 mg via ORAL
  Filled 2017-06-27: qty 1

## 2017-06-27 NOTE — Progress Notes (Signed)
PROGRESS NOTE    Donna Graves  ZOX:[id number] DOB: 1950/11/01 DOA: 06/22/2017 PCP: Wilson Singer, MD   Brief Narrative:  66 year old female with a history of hypertension, diabetes, coronary artery disease and chronic systolic congestive heart failure with ejection fraction of 25-30%, admitted to the hospital with worsening shortness of breath and lower extremity edema consistent with decompensated CHF. She was admitted for IV diuresis.   Assessment & Plan:   Principal Problem:   Acute on chronic systolic CHF (congestive heart failure) (HCC) Active Problems:   Type II diabetes mellitus (HCC)   Essential hypertension   CAD (coronary artery disease)   1. Acute on chronic systolic CHF. Patient presents with massive volume overload. She is on intravenous Lasix, 80 mg twice a day as well as metolazone. Ejection fraction of 25-30% on last echocardiogram. Continue monitor intake and output. Net fluid balance -22.4L since admission. Weight on admission was 359 pounds. Weight this morning was noted to be 327 pounds. She needs continued IV diuresis. Since she had 9.1 L of urine output yesterday, will discontinue further metolazone and continue with just lasix, so as not to precipitate acute renal failure. 2. Type 2 diabetes. Blood sugars currently stable. Continue sliding scale insulin. 3. Hypertension. Blood pressure is currently stable. Continue Coreg and enalapril. 4. Coronary artery disease. No complaints of chest pain. Troponin negative. Continue on statin, beta blocker and ACE inhibitor. 5. Hypokalemia. replace   DVT prophylaxis: Lovenox Code Status: Full code Family Communication: No family present Disposition Plan: Discharge home once improved   Consultants:     Procedures:     Antimicrobials:      Subjective: Overall swelling improving as is breathing. Still has significant abdominal wall edema.  Objective: Vitals:   06/27/17 0500 06/27/17 0537 06/27/17 0856  06/27/17 1333  BP:  (!) 123/56 128/70 110/64  Pulse:  71 68 72  Resp:  18  20  Temp:  97.9 F (36.6 C)  97.9 F (36.6 C)  TempSrc:  Oral  Oral  SpO2:  94%  91%  Weight: (!) 148.5 kg (327 lb 6.4 oz)     Height:        Intake/Output Summary (Last 24 hours) at 06/27/17 1625 Last data filed at 06/27/17 1200  Gross per 24 hour  Intake              960 ml  Output             8250 ml  Net            -7290 ml   Filed Weights   06/25/17 0425 06/26/17 0600 06/27/17 0500  Weight: (!) 158.3 kg (348 lb 14.4 oz) (!) 152.8 kg (336 lb 14.4 oz) (!) 148.5 kg (327 lb 6.4 oz)    Examination:  General exam: Appears calm and comfortable  Respiratory system: bilateral crackles at bases. Respiratory effort normal. Cardiovascular system: S1 & S2 heard, RRR. No JVD, murmurs, rubs, gallops or clicks. 1+ pedal edema. Gastrointestinal system: Abdomen is distended, soft and nontender. No organomegaly or masses felt. Normal bowel sounds heard. +abdominal wall edema Central nervous system: Alert and oriented. No focal neurological deficits. Extremities: Symmetric 5 x 5 power. Skin: No rashes, lesions or ulcers Psychiatry: Judgement and insight appear normal. Mood & affect appropriate.     Data Reviewed: I have personally reviewed following labs and imaging studies  CBC:  Recent Labs Lab 06/22/17 2149 06/23/17 0502  WBC 6.1 5.9  HGB 16.6* 15.5*  HCT 52.7* 49.0*  MCV 93.9 93.5  PLT 179 167   Basic Metabolic Panel:  Recent Labs Lab 06/23/17 0502 06/24/17 0630 06/25/17 0443 06/26/17 0442 06/27/17 0504  NA 138 140 136 135 134*  K 3.2* 3.7 3.3* 3.3* 3.6  CL 98* 102 95* 92* 90*  CO2 29 27 29 31 30   GLUCOSE 118* 139* 146* 133* 149*  BUN 14 14 15 19 20   CREATININE 0.66 0.78 0.83 0.96 0.93  CALCIUM 8.6* 8.8* 9.2 9.2 9.3   GFR: Estimated Creatinine Clearance: 87.8 mL/min (by C-G formula based on SCr of 0.93 mg/dL). Liver Function Tests: No results for input(s): AST, ALT, ALKPHOS,  BILITOT, PROT, ALBUMIN in the last 168 hours. No results for input(s): LIPASE, AMYLASE in the last 168 hours. No results for input(s): AMMONIA in the last 168 hours. Coagulation Profile: No results for input(s): INR, PROTIME in the last 168 hours. Cardiac Enzymes:  Recent Labs Lab 06/22/17 1652  TROPONINI <0.03   BNP (last 3 results) No results for input(s): PROBNP in the last 8760 hours. HbA1C: No results for input(s): HGBA1C in the last 72 hours. CBG:  Recent Labs Lab 06/26/17 1620 06/26/17 2017 06/27/17 0746 06/27/17 1129 06/27/17 1622  GLUCAP 175* 161* 153* 200* 206*   Lipid Profile: No results for input(s): CHOL, HDL, LDLCALC, TRIG, CHOLHDL, LDLDIRECT in the last 72 hours. Thyroid Function Tests: No results for input(s): TSH, T4TOTAL, FREET4, T3FREE, THYROIDAB in the last 72 hours. Anemia Panel: No results for input(s): VITAMINB12, FOLATE, FERRITIN, TIBC, IRON, RETICCTPCT in the last 72 hours. Sepsis Labs: No results for input(s): PROCALCITON, LATICACIDVEN in the last 168 hours.  No results found for this or any previous visit (from the past 240 hour(s)).       Radiology Studies: No results found.      Scheduled Meds: . acidophilus  1 capsule Oral Daily  . carvedilol  12.5 mg Oral BID WC  . enalapril  10 mg Oral BID  . enoxaparin (LOVENOX) injection  80 mg Subcutaneous Q24H  . furosemide  80 mg Intravenous Q12H  . insulin aspart  0-5 Units Subcutaneous QHS  . insulin aspart  0-9 Units Subcutaneous TID WC  . magnesium oxide  400 mg Oral Daily  . multivitamin with minerals  1 tablet Oral Daily  . pantoprazole  40 mg Oral Daily  . potassium chloride SA  20 mEq Oral BID  . simvastatin  40 mg Oral q1800  . sodium chloride flush  3 mL Intravenous Q12H   Continuous Infusions: . sodium chloride       LOS: 5 days    Time spent:    MEMON,JEHANZEB, MD [facility] Pager [phone]  If 7PM-7AM, please contact  night-coverage [email or web address] Password [email or web address] 06/27/2017, 4:25 PM

## 2017-06-27 NOTE — Care Management Note (Signed)
Case Management Note  Patient Details  Name: Donna Graves MRN: [id number] Date of Birth: 07/30/1951  If discussed at Long Length of Stay Meetings, dates discussed:  06/27/2017  Additional Comments:  Malcolm Metro, RN 06/27/2017, 1:45 PM

## 2017-06-28 LAB — BASIC METABOLIC PANEL
Anion gap: 14 (ref 5–15)
BUN: 21 mg/dL — AB (ref 6–20)
CO2: 32 mmol/L (ref 22–32)
CREATININE: 0.84 mg/dL (ref 0.44–1.00)
Calcium: 9.4 mg/dL (ref 8.9–10.3)
Chloride: 87 mmol/L — ABNORMAL LOW (ref 101–111)
GFR calc Af Amer: >60 mL/min (ref >60)
Glucose, Bld: 256 mg/dL — ABNORMAL HIGH (ref 65–99)
Potassium: 2.6 mmol/L — CL (ref 3.5–5.1)
SODIUM: 133 mmol/L — AB (ref 135–145)

## 2017-06-28 LAB — GLUCOSE, CAPILLARY
GLUCOSE-CAPILLARY: 147 mg/dL — AB (ref 65–99)
Glucose-Capillary: 237 mg/dL — ABNORMAL HIGH (ref 65–99)
Glucose-Capillary: 180 mg/dL — ABNORMAL HIGH (ref 65–99)
Glucose-Capillary: 223 mg/dL — ABNORMAL HIGH (ref 65–99)

## 2017-06-28 LAB — MAGNESIUM: MAGNESIUM: 1.8 mg/dL (ref 1.7–2.4)

## 2017-06-28 MED ORDER — POTASSIUM CHLORIDE CRYS ER 20 MEQ PO TBCR
40.0000 meq | EXTENDED_RELEASE_TABLET | Freq: Two times a day (BID) | ORAL | Status: DC
  Administered 2017-06-28 – 2017-06-30 (×4): 40 meq via ORAL
  Filled 2017-06-28 (×4): qty 2

## 2017-06-28 MED ORDER — POTASSIUM CHLORIDE CRYS ER 20 MEQ PO TBCR
40.0000 meq | EXTENDED_RELEASE_TABLET | Freq: Once | ORAL | Status: AC
  Administered 2017-06-28: 40 meq via ORAL
  Filled 2017-06-28: qty 2

## 2017-06-28 MED ORDER — SENNOSIDES-DOCUSATE SODIUM 8.6-50 MG PO TABS
2.0000 | ORAL_TABLET | Freq: Two times a day (BID) | ORAL | Status: DC
  Administered 2017-06-28 – 2017-06-30 (×4): 2 via ORAL
  Filled 2017-06-28 (×4): qty 2

## 2017-06-28 MED ORDER — POLYETHYLENE GLYCOL 3350 17 G PO PACK
17.0000 g | PACK | Freq: Every day | ORAL | Status: DC
  Administered 2017-06-28 – 2017-06-30 (×3): 17 g via ORAL
  Filled 2017-06-28 (×3): qty 1

## 2017-06-28 NOTE — Progress Notes (Signed)
CRITICAL VALUE ALERT  Critical Value:  2.6 Potassium  Date & Time Notied:  06/28/2017 at 1053  Provider Notified: Dr. Laural Benes

## 2017-06-28 NOTE — Progress Notes (Signed)
PROGRESS NOTE    Donna Graves  ZOX:[id number] DOB: [birth year] DOA: 06/22/2017 PCP: Wilson Singer, MD   Brief Narrative:  66 year old female with a history of hypertension, diabetes, coronary artery disease and chronic systolic congestive heart failure with ejection fraction of 25-30%, admitted to the hospital with worsening shortness of breath and lower extremity edema consistent with decompensated CHF. She was admitted for IV diuresis.   Assessment & Plan:   Principal Problem:   Acute on chronic systolic CHF (congestive heart failure) (HCC) Active Problems:   Type II diabetes mellitus (HCC)   Essential hypertension   CAD (coronary artery disease)   1. Acute on chronic systolic CHF -  Patient presented with massive volume overload. She is on intravenous Lasix, 80 mg twice a day as well as metolazone. Ejection fraction of 25-30% on last echocardiogram. Continue monitor intake and output. Net fluid balance -27 L since admission. Weight on admission was 359 pounds. Weight this morning was noted to be 320 pounds. She needs continued IV diuresis. Since she had 9.1 L of urine output yesterday, will discontinue further metolazone and continue with just lasix, so as not to precipitate acute renal failure. 2. Type 2 diabetes. Blood sugars currently stable. Continue sliding scale insulin. 3. Hypertension. Blood pressure is currently stable. Continue Coreg and enalapril. 4. Coronary artery disease. No complaints of chest pain. Troponin negative. Continue on statin, beta blocker and ACE inhibitor. 5. Hypokalemia. Replacing as needed and following.   DVT prophylaxis: Lovenox Code Status: Full code Family Communication: No family present Disposition Plan: Discharge home 1-2 days   Subjective: Pt reports that she feels like she is doing much better, she is sitting up now, ambulating a bit more. No SOB. No CP.   Objective: Vitals:   06/27/17 1333 06/27/17 1953 06/27/17 2025 06/28/17  0606  BP: 110/64  (!) 119/51 (!) 103/54  Pulse: 72  72 69  Resp: Temp: 97.9 F (36.6 C)  97.7 F (36.5 C) 97.6 F (36.4 C)  TempSrc: Oral  Oral Oral  SpO2: 91% 93% 93% 95%  Weight:    (!) 145.3 kg (320 lb 6.4 oz)  Height:        Intake/Output Summary (Last 24 hours) at 06/28/17 1047 Last data filed at 06/28/17 0610  Gross per 24 hour  Intake              480 ml  Output             4000 ml  Net            -3520 ml   Filed Weights   06/26/17 0600 06/27/17 0500 06/28/17 0606  Weight: (!) 152.8 kg (336 lb 14.4 oz) (!) 148.5 kg (327 lb 6.4 oz) (!) 145.3 kg (320 lb 6.4 oz)   Examination:  General exam: Appears calm and comfortable  Respiratory system: bilateral crackles at bases. Respiratory effort normal. Cardiovascular system: S1 & S2 heard, RRR. No JVD, murmurs, rubs, gallops or clicks. 1+ pedal edema. Gastrointestinal system: Abdomen is distended, soft and nontender. No organomegaly or masses felt. Normal bowel sounds heard. +abdominal wall edema Central nervous system: Alert and oriented. No focal neurological deficits. Extremities: Symmetric 5 x 5 power. Skin: No rashes, lesions or ulcers Psychiatry: Judgement and insight appear normal. Mood & affect appropriate.     Data Reviewed: I have personally reviewed following labs and imaging studies  CBC:  Recent Labs Lab 06/22/17 2149 06/23/17 0502  WBC  6.1 5.9  HGB 16.6* 15.5*  HCT 52.7* 49.0*  MCV 93.9 93.5  PLT 179 167   Basic Metabolic Panel:  Recent Labs Lab 06/23/17 0502 06/24/17 0630 06/25/17 0443 06/26/17 0442 06/27/17 0504  NA 138 140 136 135 134*  K 3.2* 3.7 3.3* 3.3* 3.6  CL 98* 102 95* 92* 90*  CO2 29 27 29 31 30   GLUCOSE 118* 139* 146* 133* 149*  BUN 14 14 15 19 20   CREATININE 0.66 0.78 0.83 0.96 0.93  CALCIUM 8.6* 8.8* 9.2 9.2 9.3   GFR: Estimated Creatinine Clearance: 86.5 mL/min (by C-G formula based on SCr of 0.93 mg/dL). Liver Function Tests: No results for input(s): AST,  ALT, ALKPHOS, BILITOT, PROT, ALBUMIN in the last 168 hours. No results for input(s): LIPASE, AMYLASE in the last 168 hours. No results for input(s): AMMONIA in the last 168 hours. Coagulation Profile: No results for input(s): INR, PROTIME in the last 168 hours. Cardiac Enzymes:  Recent Labs Lab 06/22/17 1652  TROPONINI <0.03   BNP (last 3 results) No results for input(s): PROBNP in the last 8760 hours. HbA1C: No results for input(s): HGBA1C in the last 72 hours. CBG:  Recent Labs Lab 06/27/17 0746 06/27/17 1129 06/27/17 1622 06/27/17 2109 06/28/17 0812  GLUCAP 153* 200* 206* 190* 147*   Lipid Profile: No results for input(s): CHOL, HDL, LDLCALC, TRIG, CHOLHDL, LDLDIRECT in the last 72 hours. Thyroid Function Tests: No results for input(s): TSH, T4TOTAL, FREET4, T3FREE, THYROIDAB in the last 72 hours. Anemia Panel: No results for input(s): VITAMINB12, FOLATE, FERRITIN, TIBC, IRON, RETICCTPCT in the last 72 hours. Sepsis Labs: No results for input(s): PROCALCITON, LATICACIDVEN in the last 168 hours.  No results found for this or any previous visit (from the past 240 hour(s)).    Radiology Studies: No results found.  Scheduled Meds: . acidophilus  1 capsule Oral Daily  . carvedilol  12.5 mg Oral BID WC  . enalapril  10 mg Oral BID  . enoxaparin (LOVENOX) injection  80 mg Subcutaneous Q24H  . furosemide  80 mg Intravenous Q12H  . insulin aspart  0-5 Units Subcutaneous QHS  . insulin aspart  0-9 Units Subcutaneous TID WC  . magnesium oxide  400 mg Oral Daily  . multivitamin with minerals  1 tablet Oral Daily  . pantoprazole  40 mg Oral Daily  . potassium chloride SA  20 mEq Oral BID  . simvastatin  40 mg Oral q1800  . sodium chloride flush  3 mL Intravenous Q12H   Continuous Infusions: . sodium chloride       LOS: 6 days   Time spent: 26 mins  Standley Dakins, MD [facility] Pager [phone]  If 7PM-7AM, please contact  night-coverage [email or web address] Password [email or web address] 06/28/2017, 10:47 AM

## 2017-06-29 LAB — BASIC METABOLIC PANEL
Anion gap: 13 (ref 5–15)
BUN: 23 mg/dL — ABNORMAL HIGH (ref 6–20)
CALCIUM: 9.5 mg/dL (ref 8.9–10.3)
CHLORIDE: 87 mmol/L — AB (ref 101–111)
CO2: 33 mmol/L — ABNORMAL HIGH (ref 22–32)
Creatinine, Ser: 0.77 mg/dL (ref 0.44–1.00)
Glucose, Bld: 152 mg/dL — ABNORMAL HIGH (ref 65–99)
Potassium: 3.3 mmol/L — ABNORMAL LOW (ref 3.5–5.1)
SODIUM: 133 mmol/L — AB (ref 135–145)

## 2017-06-29 LAB — GLUCOSE, CAPILLARY
GLUCOSE-CAPILLARY: 158 mg/dL — AB (ref 65–99)
Glucose-Capillary: 203 mg/dL — ABNORMAL HIGH (ref 65–99)
Glucose-Capillary: 183 mg/dL — ABNORMAL HIGH (ref 65–99)
Glucose-Capillary: 258 mg/dL — ABNORMAL HIGH (ref 65–99)

## 2017-06-29 LAB — MAGNESIUM: MAGNESIUM: 1.8 mg/dL (ref 1.7–2.4)

## 2017-06-29 MED ORDER — FUROSEMIDE 80 MG PO TABS
160.0000 mg | ORAL_TABLET | Freq: Two times a day (BID) | ORAL | Status: DC
  Administered 2017-06-30: 160 mg via ORAL
  Filled 2017-06-29: qty 2

## 2017-06-29 MED ORDER — POTASSIUM CHLORIDE CRYS ER 20 MEQ PO TBCR: 40.0000 meq | EXTENDED_RELEASE_TABLET | Freq: Once | ORAL | Status: AC

## 2017-06-29 MED ORDER — INSULIN ASPART 100 UNIT/ML ~~LOC~~ SOLN
3.0000 [IU] | Freq: Three times a day (TID) | SUBCUTANEOUS | Status: DC
  Administered 2017-06-29: 3 [IU] via SUBCUTANEOUS

## 2017-06-29 MED ORDER — INSULIN ASPART 100 UNIT/ML ~~LOC~~ SOLN: 8.0000 [IU] | Freq: Three times a day (TID) | SUBCUTANEOUS | Status: DC

## 2017-06-29 MED ORDER — INSULIN ASPART 100 UNIT/ML ~~LOC~~ SOLN
6.0000 [IU] | Freq: Three times a day (TID) | SUBCUTANEOUS | Status: DC
  Administered 2017-06-29 – 2017-06-30 (×2): 6 [IU] via SUBCUTANEOUS

## 2017-06-29 NOTE — Progress Notes (Signed)
PROGRESS NOTE    Donna Graves  YNW:[id number] DOB: 02/18/1951 DOA: 06/22/2017 PCP: Wilson Singer, MD   Brief Narrative:  66 year old female with a history of hypertension, diabetes, coronary artery disease and chronic systolic congestive heart failure with ejection fraction of 25-30%, admitted to the hospital with worsening shortness of breath and lower extremity edema consistent with decompensated CHF. She was admitted for IV diuresis.  Assessment & Plan:   Principal Problem:   Acute on chronic systolic CHF (congestive heart failure) (HCC) Active Problems:   Type II diabetes mellitus (HCC)   Essential hypertension   CAD (coronary artery disease)  1. Acute on chronic systolic CHF -  Patient presented with massive volume overload. She is on intravenous Lasix, 80 mg twice a day as well as metolazone. Ejection fraction of 25-30% on last echocardiogram. Continue monitor intake and output. Net fluid balance -27 L since admission. Weight on admission was 359 pounds. Weight this morning was noted to be 314 pounds. She needs continued IV diuresis today but plan to transition to oral diuresis tomorrow and likely discharge home. She has lost over 31 liters since admission.  She will need to follow up with Dr. Wyline Mood on discharge.  She has an appt early November per patient.  2. Type 2 diabetes. Blood sugars currently stable. Continue sliding scale insulin. Added low dose prandial coverage.  3. Hypertension. Blood pressure is currently stable. Continue Coreg and enalapril. 4. Coronary artery disease. No complaints of chest pain. Troponin negative. Continue on statin, beta blocker and ACE inhibitor. 5. Hypokalemia. Replacing as needed and following.     DVT prophylaxis: Lovenox Code Status: Full code Family Communication: No family present Disposition Plan: Discharge likely tomorrow    Subjective: Pt sitting up and eating well.  She is diuresing well and labs holding up.  She says that  she feels better today.    Objective: Vitals:   06/28/17 1453 06/28/17 1941 06/28/17 2213 06/29/17 0559  BP: (!) 94/51  126/64 105/63  Pulse: 71  70 73  Resp: Temp: 97.8 F (36.6 C)  98.4 F (36.9 C) 97.6 F (36.4 C)  TempSrc: Oral  Oral Oral  SpO2: 93% 93%  94%  Weight:    (!) 142.7 kg (314 lb 11.2 oz)  Height:        Intake/Output Summary (Last 24 hours) at 06/29/17 0939 Last data filed at 06/28/17 2231  Gross per 24 hour  Intake              310 ml  Output             4000 ml  Net            -3690 ml   Filed Weights   06/27/17 0500 06/28/17 0606 06/29/17 0559  Weight: (!) 148.5 kg (327 lb 6.4 oz) (!) 145.3 kg (320 lb 6.4 oz) (!) 142.7 kg (314 lb 11.2 oz)   Examination:  General exam: Morbidly obese female.  Appears calm and comfortable. Sitting up in chair.   Respiratory system: bilateral crackles at bases. Respiratory effort normal. Cardiovascular system: S1 & S2 heard, RRR. No JVD, murmurs, rubs, gallops or clicks. Trace pedal edema. Gastrointestinal system: Abdomen is distended, soft and nontender. No organomegaly or masses felt. Normal bowel sounds heard. +abdominal wall edema Central nervous system: Alert and oriented. No focal neurological deficits. Extremities: Symmetric 5 x 5 power. Skin: No rashes, lesions or ulcers Psychiatry: Judgement and insight appear normal.  Mood & affect appropriate.   Data Reviewed: I have personally reviewed following labs and imaging studies  CBC:  Recent Labs Lab 06/22/17 2149 06/23/17 0502  WBC 6.1 5.9  HGB 16.6* 15.5*  HCT 52.7* 49.0*  MCV 93.9 93.5  PLT 179 167   Basic Metabolic Panel:  Recent Labs Lab 06/25/17 0443 06/26/17 0442 06/27/17 0504 06/28/17 0950 06/29/17 0510  NA 136 135 134* 133* 133*  K 3.3* 3.3* 3.6 2.6* 3.3*  CL 95* 92* 90* 87* 87*  CO2 29 31 30  32 33*  GLUCOSE 146* 133* 149* 256* 152*  BUN 15 19 20  21* 23*  CREATININE 0.83 0.96 0.93 0.84 0.77  CALCIUM 9.2 9.2 9.3 9.4 9.5  MG   --   --   --  1.8 1.8   GFR: Estimated Creatinine Clearance: 99.5 mL/min (by C-G formula based on SCr of 0.77 mg/dL). Liver Function Tests: No results for input(s): AST, ALT, ALKPHOS, BILITOT, PROT, ALBUMIN in the last 168 hours. No results for input(s): LIPASE, AMYLASE in the last 168 hours. No results for input(s): AMMONIA in the last 168 hours. Coagulation Profile: No results for input(s): INR, PROTIME in the last 168 hours. Cardiac Enzymes:  Recent Labs Lab 06/22/17 1652  TROPONINI <0.03   BNP (last 3 results) No results for input(s): PROBNP in the last 8760 hours. HbA1C: No results for input(s): HGBA1C in the last 72 hours. CBG:  Recent Labs Lab 06/28/17 0812 06/28/17 1122 06/28/17 1713 06/28/17 2211 06/29/17 0753  GLUCAP 147* 237* 180* 223* 158*   Lipid Profile: No results for input(s): CHOL, HDL, LDLCALC, TRIG, CHOLHDL, LDLDIRECT in the last 72 hours. Thyroid Function Tests: No results for input(s): TSH, T4TOTAL, FREET4, T3FREE, THYROIDAB in the last 72 hours. Anemia Panel: No results for input(s): VITAMINB12, FOLATE, FERRITIN, TIBC, IRON, RETICCTPCT in the last 72 hours. Sepsis Labs: No results for input(s): PROCALCITON, LATICACIDVEN in the last 168 hours.  No results found for this or any previous visit (from the past 240 hour(s)).    Radiology Studies: No results found.  Scheduled Meds: . acidophilus  1 capsule Oral Daily  . carvedilol  12.5 mg Oral BID WC  . enalapril  10 mg Oral BID  . enoxaparin (LOVENOX) injection  80 mg Subcutaneous Q24H  . furosemide  80 mg Intravenous Q12H  . insulin aspart  0-5 Units Subcutaneous QHS  . insulin aspart  0-9 Units Subcutaneous TID WC  . insulin aspart  3 Units Subcutaneous TID WC  . magnesium oxide  400 mg Oral Daily  . multivitamin with minerals  1 tablet Oral Daily  . pantoprazole  40 mg Oral Daily  . polyethylene glycol  17 g Oral Daily  . potassium chloride SA  40 mEq Oral BID  . senna-docusate  2  tablet Oral BID  . simvastatin  40 mg Oral q1800  . sodium chloride flush  3 mL Intravenous Q12H   Continuous Infusions: . sodium chloride       LOS: 7 days   Time spent: 24 mins  Standley Dakins, MD [facility] Pager [phone]  If 7PM-7AM, please contact night-coverage [email or web address] Password [email or web address] 06/29/2017, 9:39 AM

## 2017-06-29 NOTE — Care Management Note (Signed)
Case Management Note  Patient Details  Name: Donna Graves MRN: [id number] Date of Birth: [birth year]  If discussed at Long Length of Stay Meetings, dates discussed:  06/29/2017 Additional Comments:  Malcolm Metro, RN 06/29/2017, 3:34 PM

## 2017-06-29 NOTE — Progress Notes (Signed)
Inpatient Diabetes Program Recommendations  AACE/ADA: New Consensus Statement on Inpatient Glycemic Control (2015)  Target Ranges:  Prepandial:   less than 140 mg/dL      Peak postprandial:   less than 180 mg/dL (1-2 hours)      Critically ill patients:  140 - 180 mg/dL   Results for Donna Graves, Donna Graves (MRN [id number]) as of 06/29/2017 08:56  Ref. Range 06/28/2017 08:12 06/28/2017 11:22 06/28/2017 17:13 06/28/2017 22:11 06/29/2017 07:53  Glucose-Capillary Latest Ref Range: 65 - 99 mg/dL 694 (H) 503 (H) 888 (H) 223 (H) 158 (H)   Review of Glycemic Control  Diabetes history: DM2 Outpatient Diabetes medications: Amaryl 4 mg daily Current orders for Inpatient glycemic control: Novolog 0-9 units TID with meals, Novolog 0-5 units QHS  Inpatient Diabetes Program Recommendations: Insulin - Meal Coverage: Please consider ordering Novolog 3 units TID with meals for meal coverage if patient eats at least 50% of meal.  Thanks, Orlando Penner, RN, MSN, CDE Diabetes Coordinator Inpatient Diabetes Program [phone] (Team Pager from 8am to 5pm)

## 2017-06-30 LAB — BASIC METABOLIC PANEL
Anion gap: 14 (ref 5–15)
BUN: 28 mg/dL — AB (ref 6–20)
CO2: 29 mmol/L (ref 22–32)
CREATININE: 0.86 mg/dL (ref 0.44–1.00)
Calcium: 9.3 mg/dL (ref 8.9–10.3)
Chloride: 89 mmol/L — ABNORMAL LOW (ref 101–111)
GFR calc Af Amer: >60 mL/min (ref >60)
GFR calc non Af Amer: >60 mL/min (ref >60)
GLUCOSE: 158 mg/dL — AB (ref 65–99)
Potassium: 3.2 mmol/L — ABNORMAL LOW (ref 3.5–5.1)
Sodium: 132 mmol/L — ABNORMAL LOW (ref 135–145)

## 2017-06-30 LAB — MAGNESIUM: Magnesium: 1.9 mg/dL (ref 1.7–2.4)

## 2017-06-30 LAB — GLUCOSE, CAPILLARY: Glucose-Capillary: 162 mg/dL — ABNORMAL HIGH (ref 65–99)

## 2017-06-30 MED ORDER — POTASSIUM CHLORIDE CRYS ER 20 MEQ PO TBCR: 40.0000 meq | EXTENDED_RELEASE_TABLET | Freq: Two times a day (BID) | ORAL | Status: DC

## 2017-06-30 MED ORDER — METOLAZONE 2.5 MG PO TABS: ORAL_TABLET | ORAL | 3 refills | Status: DC

## 2017-06-30 MED ORDER — POTASSIUM CHLORIDE CRYS ER 20 MEQ PO TBCR
60.0000 meq | EXTENDED_RELEASE_TABLET | Freq: Once | ORAL | Status: AC
  Administered 2017-06-30: 60 meq via ORAL
  Filled 2017-06-30: qty 3

## 2017-06-30 NOTE — Discharge Summary (Signed)
Physician Discharge Summary  Donna Graves ZOX:[id number] DOB: [birth year] DOA: 06/22/2017  PCP: Wilson Singer, MD Cardiologist: Dr. Wyline Mood  Admit date: 06/22/2017 Discharge date: 06/30/2017  Admitted From: Home  Disposition: Home   Recommendations for Outpatient Follow-up:  1. Follow up with PCP in 1 weeks 2. Follow up with cardiologist in 2 weeks 3. Please obtain BMP/CBC in one week  Discharge Condition: STABLE  CODE STATUS: FULL    Brief Hospitalization Summary: Please see all hospital notes, images, labs for full details of the hospitalization. HPI: Donna Graves is a 66 y.o. female with medical history significant for hypertension, type 2 diabetes mellitus, coronary artery disease, and chronic systolic CHF with AICD recently upgraded, now presenting to the emergency department with several days of worsening dyspnea and swelling. Patient reports that she had been in her usual state of health until approximately 10 days ago when she noted the insidious development of dyspnea with exertion. She is typically able to walk about 500 feet without shortness of breath, but is now having difficulty walking across a room. She hasn't noted a concomitant increase in her lower extremity edema, and also reports swelling into her abdomen. Denies chest pain, cough, or fevers.  ED Course: Upon arrival to the ED, patient is found to be afebrile, saturating adequately on room air, and with vitals otherwise stable. EKG features a paced rhythm and chest x-ray is notable for cardiomegaly and basilar atelectasis. Chemistry panel is unremarkable and CBC is notable for a chronic polycythemia. Troponin is undetectable and BNP is elevated to 379. Patient was treated with 80 mg IV Lasix in the ED. She remained hemodynamically stable, is not in acute restaurant distress while at rest, and will be admitted to the telemetry unit for ongoing evaluation and management of dyspnea and swelling, suspected secondary to  acute on chronic systolic CHF.  Brief Narrative:  66 year old female with a history of hypertension, diabetes, coronary artery disease and chronic systolic congestive heart failure with ejection fraction of 25-30%, admitted to the hospital with worsening shortness of breath and lower extremity edema consistent with decompensated CHF. She was admitted for IV diuresis.  Assessment & Plan:   Principal Problem:   Acute on chronic systolic CHF (congestive heart failure) (HCC) Active Problems:   Type II diabetes mellitus (HCC)   Essential hypertension   CAD (coronary artery disease)  1. Acute on chronic systolic CHF -  Patient presented with massive volume overload. She is on intravenous Lasix, 160 mg twice a day as well as metolazone. Ejection fraction of 25-30% on last echocardiogram. Monitored intake and output. Net fluid balance -33 L since admission. I am very concerned that she is not taking her lasix at home as prescribed for a variety of reasons.  I counseled her about this and she agrees that she needs to take it as prescribed.  Weight on admission was 359 pounds. Weight at discharge noted to be 313 pounds. She ws given IV diuresis with lasix and then transition to oral home dose lasix 160 mg BID and discharge home.   She will need to follow up with Dr. Wyline Mood and PCP on discharge.  She has an appt early November per patient. I'd like her to be evaluated by PCP in a week. She agrees to do so.  2. Type 2 diabetes. Blood sugars currently stable. Continue sliding scale insulin. Added low dose prandial coverage in hospital. Resume home medications at discharge.  3. Hypertension. Blood pressure is currently stable. Continue  Coreg and enalapril. 4. Coronary artery disease. No complaints of chest pain. Troponin negative. Continue on statin, beta blocker and ACE inhibitor. 5. Hypokalemia. Replacing as needed and following. Increased home dose to 40 meq BID of potassium.  Recheck BMP outpatient with  PCP in a week.     DVT prophylaxis: Lovenox Code Status: Full code Family Communication: Husband at bedside Disposition Plan: Discharge home with close outpatient follow up   Discharge Diagnoses:  Principal Problem:   Acute on chronic systolic CHF (congestive heart failure) (HCC) Active Problems:   Type II diabetes mellitus (HCC)   Essential hypertension   CAD (coronary artery disease)  Discharge Instructions: Discharge Instructions    (HEART FAILURE PATIENTS) Call MD:  Anytime you have any of the following symptoms: 1) 3 pound weight gain in 24 hours or 5 pounds in 1 week 2) shortness of breath, with or without a dry hacking cough 3) swelling in the hands, feet or stomach 4) if you have to sleep on extra pillows at night in order to breathe.    Complete by:  As directed    AMB referral to CHF clinic    Complete by:  As directed    Call MD for:  difficulty breathing, headache or visual disturbances    Complete by:  As directed    Call MD for:  extreme fatigue    Complete by:  As directed    Call MD for:  hives    Complete by:  As directed    Call MD for:  persistant dizziness or light-headedness    Complete by:  As directed    Call MD for:  persistant nausea and vomiting    Complete by:  As directed    Call MD for:  severe uncontrolled pain    Complete by:  As directed    Diet - low sodium heart healthy    Complete by:  As directed    Increase activity slowly    Complete by:  As directed      Allergies as of 06/30/2017      Reactions   Codeine Rash      Medication List    TAKE these medications   acetaminophen 650 MG CR tablet Commonly known as:  TYLENOL Take 650-1,300 mg by mouth every 8 (eight) hours as needed for pain.   acidophilus Caps capsule Take 1 capsule by mouth daily. ULTIMATE FLORA PROBIOTIC   ALIVE ONCE DAILY WOMENS 50+ PO Take 1 tablet by mouth daily.   BLACK CHERRY CONCENTRATE PO Take 2 tablets by mouth daily.   carvedilol 12.5 MG  tablet Commonly known as:  COREG TAKE 12.5 mg  BY MOUTH TWICE DAILY   dicyclomine 10 MG capsule Commonly known as:  BENTYL Take 10 mg by mouth 3 (three) times daily as needed for spasms.   enalapril 10 MG tablet Commonly known as:  VASOTEC Take 10 mg by mouth 2 (two) times daily.   furosemide 80 MG tablet Commonly known as:  LASIX Take 2 tablets (160 mg total) by mouth 2 (two) times daily.   glimepiride 2 MG tablet Commonly known as:  AMARYL Take 4 mg by mouth daily. BEFORE A MEAL   magnesium oxide 400 MG tablet Commonly known as:  MAG-OX Take 400 mg by mouth daily.   metolazone 2.5 MG tablet Commonly known as:  ZAROXOLYN Take 2.5mg  every Friday. What changed:  how much to take  how to take this  when to take this  additional instructions   omeprazole 20 MG capsule Commonly known as:  PRILOSEC Take 20 mg by mouth every morning.   ondansetron 4 MG tablet Commonly known as:  ZOFRAN Take 4 mg by mouth every 8 (eight) hours as needed for nausea.   polycarbophil 625 MG tablet Commonly known as:  FIBERCON Take 625 mg by mouth 4 (four) times daily.   potassium chloride SA 20 MEQ tablet Commonly known as:  K-DUR,KLOR-CON Take 2 tablets (40 mEq total) by mouth 2 (two) times daily. What changed:  how much to take   RED YEAST RICE PO Take 2 tablets by mouth daily.   simvastatin 40 MG tablet Commonly known as:  ZOCOR Take 40 mg by mouth every evening.   traZODone 50 MG tablet Commonly known as:  DESYREL Take 50 mg by mouth at bedtime as needed for sleep.   VITAMIN D PO Take 7,000 Units by mouth daily.      Follow-up Information    Branch, Dorothe Pea, MD. Schedule an appointment as soon as possible for a visit in 1 week(s).   Specialty:  Cardiology Why:  Hospital Follow Up  Contact information: [address] [place] [address] [phone]        Wilson Singer, MD. Schedule an appointment as soon as possible for a visit in 1 week(s).    Specialty:  Internal Medicine Why:  Hospital Follow Up  Contact information: [address] [place] [address] [phone]          Allergies  Allergen Reactions  . Codeine Rash   Current Discharge Medication List    CONTINUE these medications which have CHANGED   Details  metolazone (ZAROXOLYN) 2.5 MG tablet Take 2.5mg  every Friday. Qty: 30 tablet, Refills: 3    potassium chloride SA (K-DUR,KLOR-CON) 20 MEQ tablet Take 2 tablets (40 mEq total) by mouth 2 (two) times daily.      CONTINUE these medications which have NOT CHANGED   Details  acetaminophen (TYLENOL) 650 MG CR tablet Take 650-1,300 mg by mouth every 8 (eight) hours as needed for pain.    acidophilus (RISAQUAD) CAPS capsule Take 1 capsule by mouth daily. ULTIMATE FLORA PROBIOTIC    carvedilol (COREG) 12.5 MG tablet TAKE 12.5 mg  BY MOUTH TWICE DAILY Refills: 1    Cholecalciferol (VITAMIN D PO) Take 7,000 Units by mouth daily.    dicyclomine (BENTYL) 10 MG capsule Take 10 mg by mouth 3 (three) times daily as needed for spasms.    enalapril (VASOTEC) 10 MG tablet Take 10 mg by mouth 2 (two) times daily.     furosemide (LASIX) 80 MG tablet Take 2 tablets (160 mg total) by mouth 2 (two) times daily.    glimepiride (AMARYL) 2 MG tablet Take 4 mg by mouth daily. BEFORE A MEAL    magnesium oxide (MAG-OX) 400 MG tablet Take 400 mg by mouth daily.    Misc Natural Products (BLACK CHERRY CONCENTRATE PO) Take 2 tablets by mouth daily.     Multiple Vitamins-Minerals (ALIVE ONCE DAILY WOMENS 50+ PO) Take 1 tablet by mouth daily.    omeprazole (PRILOSEC) 20 MG capsule Take 20 mg by mouth every morning.    ondansetron (ZOFRAN) 4 MG tablet Take 4 mg by mouth every 8 (eight) hours as needed for nausea.    polycarbophil (FIBERCON) 625 MG tablet Take 625 mg by mouth 4 (four) times daily.    Red Yeast Rice Extract (RED YEAST RICE PO) Take 2 tablets  by mouth daily.     simvastatin (ZOCOR) 40 MG tablet Take 40 mg  by mouth every evening.     traZODone (DESYREL) 50 MG tablet Take 50 mg by mouth at bedtime as needed for sleep.        Procedures/Studies: Dg Chest 2 View  Result Date: 06/22/2017 CLINICAL DATA:  Shortness of breath and dry cough this week. History of CHF and pneumonia. EXAM: CHEST  2 VIEW COMPARISON:  Chest radiograph May 24, 2017 FINDINGS: Stable moderate cardiomegaly. Mediastinal silhouette is unremarkable for this low inspiratory examination with crowded vascular markings. Faint bibasilar strandy densities. No pleural effusion or focal consolidation. No pneumothorax. Three lead LEFT AICD in situ. Large body habitus. Osseous structures are nonsuspicious. IMPRESSION: Stable cardiomegaly, minimal bibasilar atelectasis. Electronically Signed   By: Awilda Metro M.D.   On: 06/22/2017 22:17      Subjective: Pt reports that she feels much better, she is sitting up in chair and has been ambulating.   Discharge Exam: Vitals:   06/29/17 2047 06/30/17 0537  BP: (!) 110/47 (!) 112/40  Pulse: 69 70  Resp: 18 18  Temp: 97.7 F (36.5 C) 98.4 F (36.9 C)  SpO2: 95% 96%   Vitals:   06/29/17 0559 06/29/17 1457 06/29/17 2047 06/30/17 0537  BP: 105/63 (!) 91/52 (!) 110/47 (!) 112/40  Pulse: 73 72 69 70  Resp: 18 18 18 18   Temp: 97.6 F (36.4 C)  97.7 F (36.5 C) 98.4 F (36.9 C)  TempSrc: Oral  Oral Oral  SpO2: 94% 95% 95% 96%  Weight: (!) 142.7 kg (314 lb 11.2 oz)   (!) 142.1 kg (313 lb 4.8 oz)  Height:       General: Pt is alert, awake, not in acute distress Cardiovascular: normal S1/S2 +, no rubs, no gallops Respiratory: CTA bilaterally, no crackles, wheezing, no rhonchi Abdominal: obese, Soft, NT, ND, bowel sounds + Extremities: trace pretibial edema,  no cyanosis   The results of significant diagnostics from this hospitalization (including imaging, microbiology, ancillary and laboratory) are listed below for reference.     Microbiology: No results found for this or  any previous visit (from the past 240 hour(s)).   Labs: BNP (last 3 results)  Recent Labs  03/09/17 1115 03/30/17 2027 06/22/17 2149  BNP 305.0* 288.0* 379.0*   Basic Metabolic Panel:  Recent Labs Lab 06/26/17 0442 06/27/17 0504 06/28/17 0950 06/29/17 0510 06/30/17 0604  NA 135 134* 133* 133* 132*  K 3.3* 3.6 2.6* 3.3* 3.2*  CL 92* 90* 87* 87* 89*  CO2 31 30 32 33* 29  GLUCOSE 133* 149* 256* 152* 158*  BUN 19 20 21* 23* 28*  CREATININE 0.96 0.93 0.84 0.77 0.86  CALCIUM 9.2 9.3 9.4 9.5 9.3  MG  --   --  1.8 1.8 1.9   Liver Function Tests: No results for input(s): AST, ALT, ALKPHOS, BILITOT, PROT, ALBUMIN in the last 168 hours. No results for input(s): LIPASE, AMYLASE in the last 168 hours. No results for input(s): AMMONIA in the last 168 hours. CBC: No results for input(s): WBC, NEUTROABS, HGB, HCT, MCV, PLT in the last 168 hours. Cardiac Enzymes: No results for input(s): CKTOTAL, CKMB, CKMBINDEX, TROPONINI in the last 168 hours. BNP: Invalid input(s): POCBNP CBG:  Recent Labs Lab 06/29/17 0753 06/29/17 1148 06/29/17 1706 06/29/17 2051 06/30/17 0751  GLUCAP 158* 203* 183* 258* 162*   D-Dimer No results for input(s): DDIMER in the last 72 hours. Hgb A1c No results for  input(s): HGBA1C in the last 72 hours. Lipid Profile No results for input(s): CHOL, HDL, LDLCALC, TRIG, CHOLHDL, LDLDIRECT in the last 72 hours. Thyroid function studies No results for input(s): TSH, T4TOTAL, T3FREE, THYROIDAB in the last 72 hours.  Invalid input(s): FREET3 Anemia work up No results for input(s): VITAMINB12, FOLATE, FERRITIN, TIBC, IRON, RETICCTPCT in the last 72 hours. Urinalysis    Component Value Date/Time   COLORURINE YELLOW 03/30/2017 1946   APPEARANCEUR CLEAR 03/30/2017 1946   LABSPEC 1.014 03/30/2017 1946   PHURINE 7.0 03/30/2017 1946   GLUCOSEU NEGATIVE 03/30/2017 1946   HGBUR NEGATIVE 03/30/2017 1946   BILIRUBINUR NEGATIVE 03/30/2017 1946   KETONESUR  NEGATIVE 03/30/2017 1946   PROTEINUR NEGATIVE 03/30/2017 1946   NITRITE NEGATIVE 03/30/2017 1946   LEUKOCYTESUR NEGATIVE 03/30/2017 1946   Sepsis Labs Invalid input(s): PROCALCITONIN,  WBC,  LACTICIDVEN Microbiology No results found for this or any previous visit (from the past 240 hour(s)).  Time coordinating discharge: 38 mins  SIGNED:  Standley Dakins, MD  [facility] 06/30/2017, 9:20 AM Pager [phone]  If 7PM-7AM, please contact night-coverage [email or web address] Password [email or web address]

## 2017-06-30 NOTE — Care Management Note (Signed)
Case Management Note  Patient Details  Name: Donna Graves MRN: [id number] Date of Birth: Sep 14, 1951  Expected Discharge Date:  06/30/17               Expected Discharge Plan:  Home/Self Care  In-House Referral:  NA  Discharge planning Services  CM Consult  Post Acute Care Choice:  NA Choice offered to:  NA  Status of Service:  Completed, signed off   Additional Comments: DC home today. No CM needs noted at DC.   Malcolm Metro, RN 06/30/2017, 11:27 AM

## 2017-06-30 NOTE — Care Management Important Message (Signed)
Important Message  Patient Details  Name: Donna Graves MRN: [id number] Date of Birth: 1951-06-18   Medicare Important Message Given:  Yes    Malcolm Metro, RN 06/30/2017, 9:51 AM

## 2017-06-30 NOTE — Discharge Instructions (Signed)
Follow with Primary MD  Gosrani, Nimish C, MD  and other consultant's as instructed your Hospitalist MD ° °Please get a complete blood count and chemistry panel checked by your Primary MD at your next visit, and again as instructed by your Primary MD. ° °Get Medicines reviewed and adjusted: °Please take all your medications with you for your next visit with your Primary MD ° °Laboratory/radiological data: °Please request your Primary MD to go over all hospital tests and procedure/radiological results at the follow up, please ask your Primary MD to get all Hospital records sent to his/her office. ° °In some cases, they will be blood work, cultures and biopsy results pending at the time of your discharge. Please request that your primary care M.D. follows up on these results. ° °Also Note the following: °If you experience worsening of your admission symptoms, develop shortness of breath, life threatening emergency, suicidal or homicidal thoughts you must seek medical attention immediately by calling 911 or calling your MD immediately  if symptoms less severe. ° °You must read complete instructions/literature along with all the possible adverse reactions/side effects for all the Medicines you take and that have been prescribed to you. Take any new Medicines after you have completely understood and accpet all the possible adverse reactions/side effects.  ° °Do not drive when taking Pain medications or sleeping medications (Benzodaizepines) ° °Do not take more than prescribed Pain, Sleep and Anxiety Medications. It is not advisable to combine anxiety,sleep and pain medications without talking with your primary care practitioner ° °Special Instructions: If you have smoked or chewed Tobacco  in the last 2 yrs please stop smoking, stop any regular Alcohol  and or any Recreational drug use. ° °Wear Seat belts while driving. ° °Please note: °You were cared for by a hospitalist during your hospital stay. Once you are  discharged, your primary care physician will handle any further medical issues. Please note that NO REFILLS for any discharge medications will be authorized once you are discharged, as it is imperative that you return to your primary care physician (or establish a relationship with a primary care physician if you do not have one) for your post hospital discharge needs so that they can reassess your need for medications and monitor your lab values. ° ° ° ° °

## 2017-07-02 NOTE — Interval H&P Note (Signed)
History and Physical Interval Note:  07/02/2017 8:57 PM  Donna Graves  has presented today for surgery, with the diagnosis of chf  The various methods of treatment have been discussed with the patient and family. After consideration of risks, benefits and other options for treatment, the patient has consented to  Procedure(s): BiVI Upgrade (N/A) as a surgical intervention .  The patient's history has been reviewed, patient examined, no change in status, stable for surgery.  I have reviewed the patient's chart and labs.  Questions were answered to the patient's satisfaction.     Lewayne Bunting

## 2017-07-10 NOTE — H&P (Signed)
ICD Criteria  Current LVEF:25%. Within 12 months prior to implant: Yes   Heart failure history: Yes, Class III  Cardiomyopathy history: Yes, Non-Ischemic Cardiomyopathy.  Atrial Fibrillation/Atrial Flutter: No.  Ventricular tachycardia history: No.  Cardiac arrest history: No.  History of syndromes with risk of sudden death: No.  Previous ICD: No.  Current ICD indication: Primary  PPM indication: No.   Class I or II Bradycardia indication present: Yes  Beta Blocker therapy for 3 or more months: Yes, prescribed.   Ace Inhibitor/ARB therapy for 3 or more months: Yes, prescribed.

## 2017-07-10 NOTE — H&P (Signed)
HPI Donna Graves is referred today by Donna ReiningKathryn Lawrence, NP, for evaluation of left ventricular dysfunction. She is a morbidly obese 66 year old woman with a long-standing history of atrial fibrillation and flutter, status post multiple ablations in [place]. Ultimately she underwent AV node ablation and pacemaker insertion. She is already had one generator change out. The patient developed worsening heart failure symptoms and was initially seen with an ejection fraction of 25%. She has been placed on maximal medical therapy, and repeat echo demonstrated persistence of her left ventricular dysfunction. She is referred now to consider upgrade to a biventricular ICD. The patient is also bothered by morbid obesity and has had a difficult time losing weight for the last 40 years.she has never had syncope. She has chronic peripheral edema. She admits to being sedentary. She denies anginal symptoms.her medical therapy is as noted above. Allergies  Allergen Reactions  . Codeine Rash           Current Outpatient Prescriptions  Medication Sig Dispense Refill  . acidophilus (RISAQUAD) CAPS capsule Take 1 capsule by mouth daily. ULTIMATE FLORA PROBIOTIC    . carvedilol (COREG) 12.5 MG tablet TAKE ONE TABLET BY MOUTH TWICE DAILY  1  . Cholecalciferol (VITAMIN D PO) Take 7,000 Units by mouth daily.    Marland Kitchen. dicyclomine (BENTYL) 10 MG capsule Take 10 mg by mouth 3 (three) times daily as needed for spasms.    . enalapril (VASOTEC) 10 MG tablet Take 10 mg by mouth 2 (two) times daily.     . furosemide (LASIX) 80 MG tablet Take 2 tablets (160 mg total) by mouth 2 (two) times daily.    Marland Kitchen. glimepiride (AMARYL) 2 MG tablet Take 2 mg by mouth daily. BEFORE A MEAL    . magnesium oxide (MAG-OX) 400 MG tablet Take 400 mg by mouth daily.    . metolazone (ZAROXOLYN) 2.5 MG tablet Take 2.5mg  every Friday. 30 tablet 3  . Misc Natural Products (BLACK CHERRY CONCENTRATE PO) Take 2 tablets by mouth  daily.     . Multiple Vitamins-Minerals (ALIVE ONCE DAILY WOMENS 50+ PO) Take 1 tablet by mouth daily.    Marland Kitchen. omeprazole (PRILOSEC) 20 MG capsule Take 20 mg by mouth every morning.    . polycarbophil (FIBERCON) 625 MG tablet Take 625 mg by mouth 4 (four) times daily.    . potassium chloride SA (K-DUR,KLOR-CON) 20 MEQ tablet Take 20 mEq by mouth 2 (two) times daily.    . Red Yeast Rice Extract (RED YEAST RICE PO) Take 2 tablets by mouth daily.     . simvastatin (ZOCOR) 40 MG tablet Take 40 mg by mouth every evening.      No current facility-administered medications for this visit.          Past Medical History:  Diagnosis Date  . CHF (congestive heart failure) (HCC)   . Depression   . Diabetes mellitus without complication (HCC)   . Dysrhythmia    S/P Pacemaker 2005 for ?Tachy[place]Brady [place]  . Gout   . Hyperlipidemia   . Hypertension   . Hypothyroidism     ROS:   All systems reviewed and negative except as noted in the HPI.        Past Surgical History:  Procedure Laterality Date  . BACK SURGERY    . left toe surgery    . pace maker            Family History  Problem Relation Age of Onset  .  Diabetes Mother 59  . Heart attack Mother   . Hypertension Mother   . Diabetes Brother 46  . Arthritis Father 22     Social History        Social History  . Marital status: Married    Spouse name: N/A  . Number of children: N/A  . Years of education: N/A      Occupational History  . Not on file.       Social History Main Topics  . Smoking status: Never Smoker  . Smokeless tobacco: Never Used  . Alcohol use No  . Drug use: No  . Sexual activity: Not on file       Other Topics Concern  . Not on file      Social History Narrative  . No narrative on file     BP 106/70   Pulse 66   Ht 5\' 4"  (1.626 m)   Wt (!) 335 lb (152 kg)   SpO2 97%   BMI 57.50 kg/m   Physical  Exam:  Well appearing 66 year old woman,NAD HEENT: Unremarkable Neck:  6 cm JVD, no thyromegally Lymphatics:  No adenopathy Back:  No CVA tenderness Lungs:  Clear, with no wheezes, rales, or rhonchi HEART:  Regular rate rhythm, no murmurs, no rubs, no clicks Abd:  soft, positive bowel sounds, no organomegally, no rebound, no guarding Ext:  2 plus pulses, no edema, no cyanosis, no clubbing Skin:  No rashes no nodules Neuro:  CN II through XII intact, motor grossly intact  EKG Sinus rhythm with ventricular pacing, and pacing induced left bundle branch block  DEVICE  Normal device function.  See PaceArt for details.   Assess/Plan: 1. Chronic systolic heart failure - her ejection fraction is 25% despite maximal medical therapy. She has pacing induced left bundle branch block. I discussed the treatment options with the patient. Biventricular pacemaker versus biventricular ICD insertion were both discussed. The pros and cons of both approaches were reviewed and she would like to proceed with biventricu insertion. 2. Morbid obesity - the patient is well over 100 pounds overweight. I've explained to her that the procedure that we are planning to help her heart beat better will not make her any better if she does not lose weight. I also discussed bariatric surgery with the patient. This would be a consideration, particularly if her pumping function improves with biventricular pacing. 3. Paroxysmal atrial arrhythmias - she will continue her current medical therapy. Pacemaker interrogation does not demonstrate any atrial fibrillation or flutter. It is unclear as to why she is not on systemic anticoagulation. We will attempt to obtain additional medical records. 4.hypertension - her blood pressure is now well-controlled. She has no room to up titrate her medical therapy because of relatively low blood pressure.  Donna Graves, M.D.  EP Attending  Patient seen and examined. Agree with above. She  is stable to proceed with ICD.   Donna Graves.D.

## 2017-07-18 ENCOUNTER — Telehealth: Payer: Self-pay

## 2017-07-18 NOTE — Telephone Encounter (Signed)
Patient referred to [facility] clinic by Gypsy Balsam, NP.  Attempted ICM intro call and left message for return call.

## 2017-07-24 ENCOUNTER — Encounter: Payer: Self-pay | Admitting: Cardiology

## 2017-07-24 ENCOUNTER — Ambulatory Visit (INDEPENDENT_AMBULATORY_CARE_PROVIDER_SITE_OTHER): Payer: Medicare Other | Admitting: Cardiology

## 2017-07-24 ENCOUNTER — Encounter: Payer: Self-pay | Admitting: *Deleted

## 2017-07-24 VITALS — BP 106/70 | HR 89 | Ht 64.0 in | Wt 332.0 lb

## 2017-07-24 DIAGNOSIS — I5023 Acute on chronic systolic (congestive) heart failure: Secondary | ICD-10-CM | POA: Diagnosis not present

## 2017-07-24 DIAGNOSIS — I2510 Atherosclerotic heart disease of native coronary artery without angina pectoris: Secondary | ICD-10-CM | POA: Diagnosis not present

## 2017-07-24 DIAGNOSIS — I2583 Coronary atherosclerosis due to lipid rich plaque: Secondary | ICD-10-CM | POA: Diagnosis not present

## 2017-07-24 MED ORDER — TORSEMIDE 20 MG PO TABS: 80.0000 mg | ORAL_TABLET | Freq: Two times a day (BID) | ORAL | 11 refills | Status: DC

## 2017-07-24 NOTE — Patient Instructions (Signed)
Medication Instructions:  Your physician has recommended you make the following change in your medication:  Stop Taking Lasix  Start Taking Torsemide 80 mg Two Times Daily    Labwork: Your physician recommends that you return for lab work in: 1 Week ( The Day Before Your Next Appointment)    Testing/Procedures: NONE   Follow-Up: NONE   Any Other Special Instructions Will Be Listed Below (If Applicable).  Thank you for choosing [facility]!     If you need a refill on your cardiac medications before your next appointment, please call your pharmacy.

## 2017-07-24 NOTE — Progress Notes (Signed)
Clinical Summary Ms. Sicard is a 66 y.o.female seen today for follow up of the following medical problems.   1. Chronic systolic HF -  From clinic notes originally followed in [place]. Apparently had normal cath in early 2000s, LVEF around that time was 30-35%. - 07/2013 R[facility]: CI 2, mean PA 27, PCWP 27, nonobstructive CAD - - BiV upgrade 05/2017, from notes unable to place LV lead.  - 04/2017 echo LVEF 25-30%  - admit 06/2017 with acute on chronic systolic HF. Negative 33L per discharge summary, weight 359 on admit to 313 on discharge.  - has had some issues with medication compliance, particulary her diuretics  - home weights around 325 lbs. Has had some abdominal distension, LE edema. Worsening SOB/DOE.  - compliant with diuretics, lasix 160mg  bid and metolazone 2.5mg  on Friday   2. Atrial tachycardia - from prior cardiology notes s/p ablation - from prior hospital notes history was unclear and thought possibly to have been an aflutter ablation - no recent palpitations   Past Medical History:  Diagnosis Date  . CHF (congestive heart failure) (HCC)    Echo in 2017 LVEF 30 to 35%  . Depression   . Diabetes mellitus without complication (HCC)   . Dysrhythmia    S/P Pacemaker 2005 for ?Tachy/Brady syndrome in [place], [place]  . GERD (gastroesophageal reflux disease)   . Gout   . H/O right and left heart catheterization 07/23/2013   RA 17; RV 43/20 PCWP 27.  LAD normal  LCx  norma  RCA normal.  LVEF 55%  . Hyperlipidemia   . Hypertension   . Hypothyroidism   . Pacemaker 09/10/2013   AV node ablation and PPM in 2005; Dual chamber pacer in 2014 [employer]     Allergies  Allergen Reactions  . Codeine Rash     Current Outpatient Medications  Medication Sig Dispense Refill  . acetaminophen (TYLENOL) 650 MG CR tablet Take 650-1,300 mg by mouth every 8 (eight) hours as needed for pain.    Marland Kitchen acidophilus (RISAQUAD) CAPS capsule Take 1 capsule by mouth daily.  ULTIMATE FLORA PROBIOTIC    . carvedilol (COREG) 12.5 MG tablet TAKE 12.5 mg  BY MOUTH TWICE DAILY  1  . Cholecalciferol (VITAMIN D PO) Take 7,000 Units by mouth daily.    Marland Kitchen dicyclomine (BENTYL) 10 MG capsule Take 10 mg by mouth 3 (three) times daily as needed for spasms.    . enalapril (VASOTEC) 10 MG tablet Take 10 mg by mouth 2 (two) times daily.     . furosemide (LASIX) 80 MG tablet Take 2 tablets (160 mg total) by mouth 2 (two) times daily.    Marland Kitchen glimepiride (AMARYL) 2 MG tablet Take 4 mg by mouth daily. BEFORE A MEAL    . magnesium oxide (MAG-OX) 400 MG tablet Take 400 mg by mouth daily.    . metolazone (ZAROXOLYN) 2.5 MG tablet Take 2.5mg  every Friday. 30 tablet 3  . Misc Natural Products (BLACK CHERRY CONCENTRATE PO) Take 2 tablets by mouth daily.     . Multiple Vitamins-Minerals (ALIVE ONCE DAILY WOMENS 50+ PO) Take 1 tablet by mouth daily.    Marland Kitchen omeprazole (PRILOSEC) 20 MG capsule Take 20 mg by mouth every morning.    . ondansetron (ZOFRAN) 4 MG tablet Take 4 mg by mouth every 8 (eight) hours as needed for nausea.    . polycarbophil (FIBERCON) 625 MG tablet Take 625 mg by mouth 4 (four) times daily.    Marland Kitchen  potassium chloride SA (K-DUR,KLOR-CON) 20 MEQ tablet Take 2 tablets (40 mEq total) by mouth 2 (two) times daily.    . Red Yeast Rice Extract (RED YEAST RICE PO) Take 2 tablets by mouth daily.     . simvastatin (ZOCOR) 40 MG tablet Take 40 mg by mouth every evening.     . traZODone (DESYREL) 50 MG tablet Take 50 mg by mouth at bedtime as needed for sleep.     No current facility-administered medications for this visit.      Past Surgical History:  Procedure Laterality Date  . ANAL FISSURE REPAIR    . BACK SURGERY    . ICD IMPLANT  05/23/2017   biv  . left toe surgery    . pace maker       Allergies  Allergen Reactions  . Codeine Rash      Family History  Problem Relation Age of Onset  . Diabetes Mother 5078  . Heart attack Mother   . Hypertension Mother   . Diabetes  Brother 6763  . Arthritis Father 1979     Social History Ms. Eiland reports that  has never smoked. she has never used smokeless tobacco. Ms. Mordecai MaesWaddell reports that she does not drink alcohol.   Review of Systems CONSTITUTIONAL: No weight loss, fever, chills, weakness or fatigue.  HEENT: Eyes: No visual loss, blurred vision, double vision or yellow sclerae.No hearing loss, sneezing, congestion, runny nose or sore throat.  SKIN: No rash or itching.  CARDIOVASCULAR: per hpi RESPIRATORY: per hpi GASTROINTESTINAL: No anorexia, nausea, vomiting or diarrhea. No abdominal pain or blood.  GENITOURINARY: No burning on urination, no polyuria NEUROLOGICAL: No headache, dizziness, syncope, paralysis, ataxia, numbness or tingling in the extremities. No change in bowel or bladder control.  MUSCULOSKELETAL: No muscle, back pain, joint pain or stiffness.  LYMPHATICS: No enlarged nodes. No history of splenectomy.  PSYCHIATRIC: No history of depression or anxiety.  ENDOCRINOLOGIC: No reports of sweating, cold or heat intolerance. No polyuria or polydipsia.  Marland Kitchen.   Physical Examination Vitals:   07/24/17 1053  BP: 106/70  Pulse: 89  SpO2: 95%   Vitals:   07/24/17 1053  Weight: (!) 332 lb (150.6 kg)  Height: 5\' 4"  (1.626 m)    Gen: resting comfortably, no acute distress HEENT: no scleral icterus, pupils equal round and reactive, no palptable cervical adenopathy,  CV: RRR, no m/r/g. +JVD Resp: bilateral crackles GI: abdomen is soft, non-tender, non-distended, normal bowel sounds, no hepatosplenomegaly MSK: extremities are warm, 1+ bilateral edema Skin: warm, no rash Neuro:  no focal deficits Psych: appropriate affect   Diagnostic Studies 02/2017 echo: LVEF 25-30%, anteroseptal hypokinesis, mild RV dysfunction, PASP 29      Assessment and Plan  1. Acute on chronic systolic HF - nearly 20 lbs weight gain since hospital discharge according to our scales. Evidence of fluid overload on  exam - stop lasix. Start torsemide 80mg  bid. Continue metolazone 2.5mg  on Fridays - check BMET/Mg in 1 week - asked to call us later this week if weights are trending down. Close f/u next week in clinic - request last labs from pcp        Antoine PocheJonathan F. Koichi Platte, M.D.

## 2017-07-28 ENCOUNTER — Other Ambulatory Visit (HOSPITAL_COMMUNITY)
Admission: RE | Admit: 2017-07-28 | Discharge: 2017-07-28 | Disposition: A | Discharge status: Home / Self Care | Payer: Medicare Other | Source: Ambulatory Visit | Attending: Cardiology | Admitting: Cardiology

## 2017-07-28 DIAGNOSIS — I5023 Acute on chronic systolic (congestive) heart failure: Secondary | ICD-10-CM | POA: Diagnosis present

## 2017-07-28 LAB — BASIC METABOLIC PANEL
ANION GAP: 10 (ref 5–15)
BUN: 18 mg/dL (ref 6–20)
CO2: 33 mmol/L — ABNORMAL HIGH (ref 22–32)
Calcium: 9 mg/dL (ref 8.9–10.3)
Chloride: 96 mmol/L — ABNORMAL LOW (ref 101–111)
Creatinine, Ser: 0.93 mg/dL (ref 0.44–1.00)
GFR calc non Af Amer: >60 mL/min (ref >60)
Glucose, Bld: 135 mg/dL — ABNORMAL HIGH (ref 65–99)
POTASSIUM: 3.5 mmol/L (ref 3.5–5.1)
SODIUM: 139 mmol/L (ref 135–145)

## 2017-07-28 LAB — MAGNESIUM: MAGNESIUM: 2.1 mg/dL (ref 1.7–2.4)

## 2017-07-28 NOTE — Progress Notes (Signed)
Cardiology Office Note   Date:  07/31/2017   ID:  Donna Graves, DOB June 08, 1951, MRN [id number]  PCP:  Doree Albee, MD  Cardiologist:   Dr. Carlyle Dolly  Chief Complaint  Patient presents with  . Congestive Heart Failure  . Cardiomyopathy      History of Present Illness: Donna Graves is a 66 y.o. female who presents for ongoing assessment and management of chronic systolic heart failure dry weight around 313 pounds,, most recent echocardiogram revealed LVEF of 25%-30%.  The patient had a right heart cath,07/2013 [facility]  CI 2, mean PA 27, PCWP 27, nonobstructive CAD BiV upgrade 05/2017, from notes unable to place LV lead, atrial tachycardia.  The patient was last seen in the office on 07/24/2017 by Dr. Harl Bowie.  In that office visit it was noted that the patient had gained approximately 20 pounds was discharged from the hospital on 06/30/2017.  Lasix was discontinued and he was started on torsemide 80 mg twice daily, metolazone 2.5 mg was continued on Fridays.  He is here for close follow-up.  Labs dated 07/28/2017: Sodium 139, potassium 3.5 chloride 96, CO2 33, glucose 135, BUN 18, creatinine 0.93.  She comes today having gained 20 pounds discharged from the hospital and is now 335 pounds with significant fluid retention in the pannus and lower extremities.  The office is that she was 332 pounds (07/24/2017) the patient denies any medical noncompliance or dietary noncompliance.  She states that she is easily dyspneic on exertion.     Past Medical History:  Diagnosis Date  . CHF (congestive heart failure) ([place])    Echo in 2017 LVEF 30 to 35%  . Depression   . Diabetes mellitus without complication ([place])   . Dysrhythmia    S/P Pacemaker 2005 for ?Tachy/Brady syndrome in [place], [place]  . GERD (gastroesophageal reflux disease)   . Gout   . H/O right and left heart catheterization 07/23/2013   RA 17; RV 43/20 PCWP 27.  LAD normal  LCx  norma  RCA normal.  LVEF 55%  .  Hyperlipidemia   . Hypertension   . Hypothyroidism   . Pacemaker 09/10/2013   AV node ablation and PPM in 2005; Dual chamber pacer in 2014 [employer]    Past Surgical History:  Procedure Laterality Date  . ANAL FISSURE REPAIR    . BACK SURGERY    . ICD IMPLANT  05/23/2017   biv  . left toe surgery    . pace maker       Current Outpatient Medications  Medication Sig Dispense Refill  . acetaminophen (TYLENOL) 650 MG CR tablet Take 650-1,300 mg by mouth every 8 (eight) hours as needed for pain.    Marland Kitchen acidophilus (RISAQUAD) CAPS capsule Take 1 capsule by mouth daily. ULTIMATE FLORA PROBIOTIC    . carvedilol (COREG) 12.5 MG tablet TAKE 12.5 mg  BY MOUTH TWICE DAILY  1  . Cholecalciferol (VITAMIN D PO) Take 7,000 Units by mouth daily.    . enalapril (VASOTEC) 10 MG tablet Take 10 mg by mouth 2 (two) times daily.     Marland Kitchen glimepiride (AMARYL) 2 MG tablet Take 4 mg by mouth daily. BEFORE A MEAL    . magnesium oxide (MAG-OX) 400 MG tablet Take 400 mg by mouth daily.    . Misc Natural Products (BLACK CHERRY CONCENTRATE PO) Take 2 tablets by mouth daily.     . Multiple Vitamins-Minerals (ALIVE ONCE DAILY WOMENS 50+ PO) Take 1 tablet by mouth  daily.    . omeprazole (PRILOSEC) 20 MG capsule Take 20 mg by mouth every morning.    . ondansetron (ZOFRAN) 4 MG tablet Take 4 mg by mouth every 8 (eight) hours as needed for nausea.    . polycarbophil (FIBERCON) 625 MG tablet Take 625 mg by mouth 4 (four) times daily.    . Red Yeast Rice Extract (RED YEAST RICE PO) Take 2 tablets by mouth daily.     . simvastatin (ZOCOR) 40 MG tablet Take 40 mg by mouth every evening.     . torsemide (DEMADEX) 20 MG tablet Take 4 tablets (80 mg total) 2 (two) times daily by mouth. 240 tablet 11  . metolazone (ZAROXOLYN) 2.5 MG tablet Take 1 tablet (2.5 mg total) 2 (two) times a week by mouth. Take on Tuesday and Friday 20 tablet 3  . potassium chloride SA (K-DUR,KLOR-CON) 20 MEQ tablet Take 60 mg ( 3 Tablets)  in the AM and  40 mg ( 2 Tablets)  in the PM 150 tablet 6   No current facility-administered medications for this visit.     Allergies:   Codeine    Social History:  The patient  reports that  has never smoked. she has never used smokeless tobacco. She reports that she does not drink alcohol or use drugs.   Family History:  The patient's family history includes Arthritis (age of onset: 57) in her father; Diabetes (age of onset: 29) in her brother; Diabetes (age of onset: 78) in her mother; Heart attack in her mother; Hypertension in her mother.    ROS: All other systems are reviewed and negative. Unless otherwise mentioned in H&P    PHYSICAL EXAM: VS:  BP 112/72   Pulse 88   Ht '5\' 4"'  (1.626 m)   Wt (!) 335 lb (152 kg)   SpO2 94%   BMI 57.50 kg/m  , BMI Body mass index is 57.5 kg/m. GEN: Well nourished, well developed, in no acute distress Morbidly obese. HEENT: normal  Neck: no JVD, carotid bruits, or masses Cardiac: RRR, distant heart sounds difficult to hear any murmurs;  rubs, or gallops, 2+ pretibial edema  Respiratory:  clear to auscultation bilaterally, normal work of breathing GI: soft, nontender, significant fluid in the pannus,, + BS MS: no deformity or atrophy  Skin: warm and dry, no rash Neuro:  Strength and sensation are intact Psych: euthymic mood, full affect  Recent Labs: 03/30/2017: ALT 17 06/22/2017: B Natriuretic Peptide 379.0 06/23/2017: Hemoglobin 15.5; Platelets 167 07/28/2017: BUN 18; Creatinine, Ser 0.93; Magnesium 2.1; Potassium 3.5; Sodium 139    Lipid Panel No results found for: CHOL, TRIG, HDL, CHOLHDL, VLDL, LDLCALC, LDLDIRECT    Wt Readings from Last 3 Encounters:  07/31/17 (!) 335 lb (152 kg)  07/24/17 (!) 332 lb (150.6 kg)  06/30/17 (!) 313 lb 4.8 oz (142.1 kg)      Other studies Reviewed: Echocardiogram 05/16/17 Left ventricle: The cavity size was normal. Wall thickness was   increased in a pattern of mild LVH. Systolic function was   severely  reduced. The estimated ejection fraction was in the   range of 25% to 30%. Diffuse hypokinesis. - Aortic valve: Mildly calcified annulus. Trileaflet; normal   thickness leaflets. Valve area (VTI): 1.82 cm^2. Valve area   (Vmax): 1.79 cm^2. Valve area (Vmean): 1.58 cm^2. - Mitral valve: Mildly calcified annulus. Normal thickness leaflets   . There was mild regurgitation. - Left atrium: The atrium was moderately dilated. - Right ventricle:  The cavity size was mildly to moderately   dilated. - Right atrium: The atrium was mildly to moderately dilated. - Technically adequate study.  ASSESSMENT AND PLAN:  1.  Acute on chronic systolic heart failure: The patient had angina medication and increase in dosing of torsemide on last office visit 1 week ago.  The patient is gained 3 more pounds and continues to feel short of breath and dyspneic on exertion.  The patient states that she did get good results from metolazone, but the fluid came right back on within a few days.  She denies medical or dietary noncompliance.  She is complaining of dyspnea on exertion.  Denies rapid heart rhythm or dizziness.  I will increase metolazone to 2.5 mg on Tuesdays and Fridays, but tomorrow she will retake 5 mg x1 time dose.  Follow-up be met will be completed in 1 week.  She will increase her potassium to 60 equivalents in the a.m. and 40 mEq in the p.m. as potassium should be 4.0.  If she does not diurese well, continues to gain weight, or becomes more symptomatic, she is been advised to come to the hospital.  Due to significant reduction in EF, and chronic heart failure symptoms, would consider evaluation by Advanced Heart Failure clinic for their recommendations.  Also would consider changing enalapril to [facility].  The patient will follow up with her primary cardiologist, Dr. Harl Bowie.    2.  Bi V AICD: Patient is due to have a follow-up appointment with Dr. Lovena Le in December.  Interrogation will be completed at  that time with more recommendations if clinically warranted.  Current medicines are reviewed at length with the patient today.    Labs/ tests ordered today include: BMET Phill Myron. West Pugh, ANP, AACC   07/31/2017 2:30 PM    [facility] [address] [place], [place] [address] Phone: [phone]; Fax: [phone]

## 2017-07-31 ENCOUNTER — Encounter: Payer: Self-pay | Admitting: Adult Health

## 2017-07-31 ENCOUNTER — Ambulatory Visit (INDEPENDENT_AMBULATORY_CARE_PROVIDER_SITE_OTHER): Payer: Medicare Other | Admitting: Adult Health

## 2017-07-31 ENCOUNTER — Other Ambulatory Visit: Payer: Self-pay | Admitting: Adult Health

## 2017-07-31 VITALS — BP 112/72 | HR 88 | Ht 64.0 in | Wt 335.0 lb

## 2017-07-31 DIAGNOSIS — I2583 Coronary atherosclerosis due to lipid rich plaque: Secondary | ICD-10-CM

## 2017-07-31 DIAGNOSIS — I2510 Atherosclerotic heart disease of native coronary artery without angina pectoris: Secondary | ICD-10-CM

## 2017-07-31 DIAGNOSIS — I5022 Chronic systolic (congestive) heart failure: Secondary | ICD-10-CM | POA: Diagnosis not present

## 2017-07-31 DIAGNOSIS — I10 Essential (primary) hypertension: Secondary | ICD-10-CM

## 2017-07-31 MED ORDER — POTASSIUM CHLORIDE CRYS ER 20 MEQ PO TBCR: EXTENDED_RELEASE_TABLET | ORAL | 6 refills | Status: DC

## 2017-07-31 MED ORDER — METOLAZONE 2.5 MG PO TABS: 2.5000 mg | ORAL_TABLET | ORAL | 3 refills | Status: DC

## 2017-07-31 NOTE — Patient Instructions (Signed)
Medication Instructions:  Your physician has recommended you make the following change in your medication:  Take Metolazone 5 mg Tomorrow then start 2.5 mg every Tues. And Fri.  Increase Potassium 60 mg in the AM and 40 mg in the PM    Labwork: Your physician recommends that you return for lab work in: 1 Week    Testing/Procedures: NONE   Follow-Up: Your physician recommends that you schedule a follow-up appointment in: 3-4 weeks   Any Other Special Instructions Will Be Listed Below (If Applicable).     If you need a refill on your cardiac medications before your next appointment, please call your pharmacy. Thank you for choosing [facility]!

## 2017-08-08 NOTE — Telephone Encounter (Signed)
ICM call to patient and ICM intro given.  She agreed to monthly ICM calls.  Provided direct ICM number and encouraged to call if has fluid symptoms that do not resolve.  She was recently started on Metolazone twice a week. At last OV 07/31/2017 20-25 weight gain since last hospital discharge. She feels much better and is 5 lbs higher than her baseline.  Today's weight is 312 lbs and baseline is around 307-308 lbs.  Advised [facility] ICM remote transmission on 08/03/2017 showed above baseline after taking Metolazone.    Advised ICM transmission scheduled for 08/24/2017 and will send a copy to Dr Wyline Mood since she has an office visit the same day.   She has monitor by bedside.

## 2017-08-16 ENCOUNTER — Telehealth: Payer: Self-pay

## 2017-08-16 NOTE — Telephone Encounter (Signed)
-----   Message from Antoine Poche, MD sent at 08/04/2017  1:59 PM EST ----- Labs look good, how are her weights doing  Dominga Ferry MD

## 2017-08-16 NOTE — Telephone Encounter (Signed)
Tried to reach patient. No answer. Left message on voicemail for pt to return call. I will mail her a letter asking her to contact us.

## 2017-08-17 ENCOUNTER — Emergency Department (HOSPITAL_COMMUNITY): Payer: Medicare Other

## 2017-08-17 ENCOUNTER — Other Ambulatory Visit: Payer: Self-pay

## 2017-08-17 ENCOUNTER — Inpatient Hospital Stay (HOSPITAL_COMMUNITY)
Admission: EM | Admit: 2017-08-17 | Discharge: 2017-08-19 | DRG: 292 | Disposition: A | Discharge status: Home / Self Care | Payer: Medicare Other | Attending: Family Medicine | Admitting: Family Medicine

## 2017-08-17 ENCOUNTER — Encounter (HOSPITAL_COMMUNITY): Payer: Self-pay | Admitting: Cardiology

## 2017-08-17 ENCOUNTER — Inpatient Hospital Stay (HOSPITAL_COMMUNITY): Payer: Medicare Other

## 2017-08-17 DIAGNOSIS — R748 Abnormal levels of other serum enzymes: Secondary | ICD-10-CM | POA: Diagnosis not present

## 2017-08-17 DIAGNOSIS — R7989 Other specified abnormal findings of blood chemistry: Secondary | ICD-10-CM

## 2017-08-17 DIAGNOSIS — I480 Paroxysmal atrial fibrillation: Secondary | ICD-10-CM | POA: Diagnosis present

## 2017-08-17 DIAGNOSIS — I5043 Acute on chronic combined systolic (congestive) and diastolic (congestive) heart failure: Secondary | ICD-10-CM

## 2017-08-17 DIAGNOSIS — K219 Gastro-esophageal reflux disease without esophagitis: Secondary | ICD-10-CM | POA: Diagnosis present

## 2017-08-17 DIAGNOSIS — F419 Anxiety disorder, unspecified: Secondary | ICD-10-CM | POA: Diagnosis present

## 2017-08-17 DIAGNOSIS — Z66 Do not resuscitate: Secondary | ICD-10-CM | POA: Diagnosis present

## 2017-08-17 DIAGNOSIS — Z6841 Body Mass Index (BMI) 40.0 and over, adult: Secondary | ICD-10-CM

## 2017-08-17 DIAGNOSIS — R5383 Other fatigue: Secondary | ICD-10-CM | POA: Diagnosis not present

## 2017-08-17 DIAGNOSIS — Z9861 Coronary angioplasty status: Secondary | ICD-10-CM | POA: Diagnosis not present

## 2017-08-17 DIAGNOSIS — Z885 Allergy status to narcotic agent status: Secondary | ICD-10-CM | POA: Diagnosis not present

## 2017-08-17 DIAGNOSIS — R55 Syncope and collapse: Secondary | ICD-10-CM | POA: Diagnosis present

## 2017-08-17 DIAGNOSIS — E119 Type 2 diabetes mellitus without complications: Secondary | ICD-10-CM

## 2017-08-17 DIAGNOSIS — E11649 Type 2 diabetes mellitus with hypoglycemia without coma: Secondary | ICD-10-CM | POA: Diagnosis present

## 2017-08-17 DIAGNOSIS — I429 Cardiomyopathy, unspecified: Secondary | ICD-10-CM | POA: Diagnosis present

## 2017-08-17 DIAGNOSIS — E785 Hyperlipidemia, unspecified: Secondary | ICD-10-CM | POA: Diagnosis present

## 2017-08-17 DIAGNOSIS — E039 Hypothyroidism, unspecified: Secondary | ICD-10-CM | POA: Diagnosis present

## 2017-08-17 DIAGNOSIS — E876 Hypokalemia: Secondary | ICD-10-CM | POA: Diagnosis not present

## 2017-08-17 DIAGNOSIS — I110 Hypertensive heart disease with heart failure: Principal | ICD-10-CM | POA: Diagnosis present

## 2017-08-17 DIAGNOSIS — I509 Heart failure, unspecified: Secondary | ICD-10-CM

## 2017-08-17 DIAGNOSIS — Z95810 Presence of automatic (implantable) cardiac defibrillator: Secondary | ICD-10-CM | POA: Diagnosis not present

## 2017-08-17 DIAGNOSIS — F329 Major depressive disorder, single episode, unspecified: Secondary | ICD-10-CM | POA: Diagnosis present

## 2017-08-17 DIAGNOSIS — I5023 Acute on chronic systolic (congestive) heart failure: Secondary | ICD-10-CM

## 2017-08-17 DIAGNOSIS — Z7984 Long term (current) use of oral hypoglycemic drugs: Secondary | ICD-10-CM

## 2017-08-17 DIAGNOSIS — R778 Other specified abnormalities of plasma proteins: Secondary | ICD-10-CM

## 2017-08-17 DIAGNOSIS — E118 Type 2 diabetes mellitus with unspecified complications: Secondary | ICD-10-CM

## 2017-08-17 DIAGNOSIS — I50813 Acute on chronic right heart failure: Secondary | ICD-10-CM

## 2017-08-17 HISTORY — DX: Other specified postprocedural states: Z98.890

## 2017-08-17 HISTORY — DX: Cardiomyopathy, unspecified: I42.9

## 2017-08-17 HISTORY — DX: Unspecified atrial fibrillation: I48.91

## 2017-08-17 HISTORY — DX: Unspecified atrial flutter: I48.92

## 2017-08-17 HISTORY — DX: Chronic systolic (congestive) heart failure: I50.22

## 2017-08-17 HISTORY — DX: Presence of automatic (implantable) cardiac defibrillator: Z95.810

## 2017-08-17 HISTORY — DX: Type 2 diabetes mellitus without complications: E11.9

## 2017-08-17 LAB — COMPREHENSIVE METABOLIC PANEL
ALBUMIN: 3.8 g/dL (ref 3.5–5.0)
ALK PHOS: 120 U/L (ref 38–126)
ALT: 15 U/L (ref 14–54)
ANION GAP: 13 (ref 5–15)
AST: 49 U/L — ABNORMAL HIGH (ref 15–41)
BUN: 24 mg/dL — ABNORMAL HIGH (ref 6–20)
CO2: 33 mmol/L — AB (ref 22–32)
Calcium: 9.7 mg/dL (ref 8.9–10.3)
Chloride: 94 mmol/L — ABNORMAL LOW (ref 101–111)
Creatinine, Ser: 1.16 mg/dL — ABNORMAL HIGH (ref 0.44–1.00)
GFR calc Af Amer: 56 mL/min — ABNORMAL LOW (ref >60)
GFR calc non Af Amer: 48 mL/min — ABNORMAL LOW (ref >60)
GLUCOSE: 152 mg/dL — AB (ref 65–99)
POTASSIUM: 3.4 mmol/L — AB (ref 3.5–5.1)
SODIUM: 140 mmol/L (ref 135–145)
Total Bilirubin: 1.7 mg/dL — ABNORMAL HIGH (ref 0.3–1.2)
Total Protein: 6.8 g/dL (ref 6.5–8.1)

## 2017-08-17 LAB — CBC WITH DIFFERENTIAL/PLATELET
BASOS ABS: 0 10*3/uL (ref 0.0–0.1)
BASOS PCT: 0 %
EOS ABS: 0.1 10*3/uL (ref 0.0–0.7)
Eosinophils Relative: 1 %
HCT: 53.5 % — ABNORMAL HIGH (ref 36.0–46.0)
HEMOGLOBIN: 16.6 g/dL — AB (ref 12.0–15.0)
Lymphocytes Relative: 26 %
Lymphs Abs: 1.8 10*3/uL (ref 0.7–4.0)
MCH: 29.7 pg (ref 26.0–34.0)
MCHC: 31 g/dL (ref 30.0–36.0)
MCV: 95.7 fL (ref 78.0–100.0)
MONOS PCT: 10 %
Monocytes Absolute: 0.7 10*3/uL (ref 0.1–1.0)
NEUTROS PCT: 63 %
Neutro Abs: 4.5 10*3/uL (ref 1.7–7.7)
Platelets: 149 10*3/uL — ABNORMAL LOW (ref 150–400)
RBC: 5.59 MIL/uL — ABNORMAL HIGH (ref 3.87–5.11)
RDW: 16 % — AB (ref 11.5–15.5)
WBC: 7.1 10*3/uL (ref 4.0–10.5)

## 2017-08-17 LAB — URINALYSIS, ROUTINE W REFLEX MICROSCOPIC
Bilirubin Urine: NEGATIVE
Glucose, UA: NEGATIVE mg/dL
HGB URINE DIPSTICK: NEGATIVE
KETONES UR: NEGATIVE mg/dL
Leukocytes, UA: NEGATIVE
Nitrite: NEGATIVE
PH: 7 (ref 5.0–8.0)
PROTEIN: NEGATIVE mg/dL
SPECIFIC GRAVITY, URINE: 1.006 (ref 1.005–1.030)

## 2017-08-17 LAB — CBG MONITORING, ED
GLUCOSE-CAPILLARY: 149 mg/dL — AB (ref 65–99)
GLUCOSE-CAPILLARY: 144 mg/dL — AB (ref 65–99)

## 2017-08-17 LAB — BRAIN NATRIURETIC PEPTIDE: B Natriuretic Peptide: 557 pg/mL — ABNORMAL HIGH (ref 0.0–100.0)

## 2017-08-17 LAB — GLUCOSE, CAPILLARY
GLUCOSE-CAPILLARY: 205 mg/dL — AB (ref 65–99)
Glucose-Capillary: 142 mg/dL — ABNORMAL HIGH (ref 65–99)

## 2017-08-17 LAB — TROPONIN I
TROPONIN I: 0.04 ng/mL — AB (ref <0.03)
TROPONIN I: 0.06 ng/mL — AB (ref <0.03)
Troponin I: 0.04 ng/mL (ref <0.03)

## 2017-08-17 LAB — LIPASE, BLOOD: Lipase: 37 U/L (ref 11–51)

## 2017-08-17 MED ORDER — ONDANSETRON HCL 4 MG PO TABS
4.0000 mg | ORAL_TABLET | Freq: Four times a day (QID) | ORAL | Status: DC | PRN
  Filled 2017-08-17: qty 1

## 2017-08-17 MED ORDER — ACETAMINOPHEN 325 MG PO TABS
650.0000 mg | ORAL_TABLET | Freq: Four times a day (QID) | ORAL | Status: DC | PRN
  Administered 2017-08-18 – 2017-08-19 (×2): 650 mg via ORAL
  Filled 2017-08-17 (×3): qty 2

## 2017-08-17 MED ORDER — DICYCLOMINE HCL 10 MG PO CAPS: 10.0000 mg | ORAL_CAPSULE | Freq: Three times a day (TID) | ORAL | Status: DC | PRN

## 2017-08-17 MED ORDER — PANTOPRAZOLE SODIUM 40 MG PO TBEC
40.0000 mg | DELAYED_RELEASE_TABLET | Freq: Every day | ORAL | Status: DC
  Administered 2017-08-17 – 2017-08-19 (×3): 40 mg via ORAL
  Filled 2017-08-17 (×3): qty 1

## 2017-08-17 MED ORDER — ENOXAPARIN SODIUM 80 MG/0.8ML ~~LOC~~ SOLN
70.0000 mg | SUBCUTANEOUS | Status: DC
  Administered 2017-08-17 – 2017-08-18 (×2): 70 mg via SUBCUTANEOUS
  Filled 2017-08-17 (×2): qty 0.8

## 2017-08-17 MED ORDER — LORAZEPAM 0.5 MG PO TABS
0.5000 mg | ORAL_TABLET | Freq: Once | ORAL | Status: AC
  Administered 2017-08-17: 0.5 mg via ORAL
  Filled 2017-08-17: qty 1

## 2017-08-17 MED ORDER — ACETAMINOPHEN 325 MG PO TABS
650.0000 mg | ORAL_TABLET | Freq: Once | ORAL | Status: AC
  Administered 2017-08-17: 650 mg via ORAL
  Filled 2017-08-17: qty 2

## 2017-08-17 MED ORDER — ONDANSETRON HCL 4 MG/2ML IJ SOLN
4.0000 mg | Freq: Four times a day (QID) | INTRAMUSCULAR | Status: DC | PRN
  Administered 2017-08-17 – 2017-08-18 (×2): 4 mg via INTRAVENOUS
  Filled 2017-08-17 (×2): qty 2

## 2017-08-17 MED ORDER — SODIUM CHLORIDE 0.9 % IV SOLN
INTRAVENOUS | Status: DC
  Administered 2017-08-17: 09:00:00 via INTRAVENOUS

## 2017-08-17 MED ORDER — FUROSEMIDE 10 MG/ML IJ SOLN: 80.0000 mg | Freq: Two times a day (BID) | INTRAMUSCULAR | Status: DC

## 2017-08-17 MED ORDER — ONDANSETRON HCL 4 MG/2ML IJ SOLN
4.0000 mg | Freq: Once | INTRAMUSCULAR | Status: AC
  Administered 2017-08-17: 4 mg via INTRAVENOUS
  Filled 2017-08-17: qty 2

## 2017-08-17 MED ORDER — INSULIN ASPART 100 UNIT/ML ~~LOC~~ SOLN
0.0000 [IU] | Freq: Three times a day (TID) | SUBCUTANEOUS | Status: DC
  Administered 2017-08-17: 3 [IU] via SUBCUTANEOUS
  Administered 2017-08-18: 2 [IU] via SUBCUTANEOUS
  Administered 2017-08-18: 1 [IU] via SUBCUTANEOUS
  Administered 2017-08-19 (×3): 2 [IU] via SUBCUTANEOUS

## 2017-08-17 MED ORDER — FUROSEMIDE 80 MG PO TABS
80.0000 mg | ORAL_TABLET | Freq: Two times a day (BID) | ORAL | Status: DC
  Administered 2017-08-17 – 2017-08-18 (×2): 80 mg via ORAL
  Filled 2017-08-17 (×2): qty 1

## 2017-08-17 MED ORDER — POLYETHYLENE GLYCOL 3350 17 G PO PACK: 17.0000 g | PACK | Freq: Every day | ORAL | Status: DC | PRN

## 2017-08-17 MED ORDER — ACETAMINOPHEN 650 MG RE SUPP: 650.0000 mg | Freq: Four times a day (QID) | RECTAL | Status: DC | PRN

## 2017-08-17 MED ORDER — FUROSEMIDE 40 MG PO TABS
40.0000 mg | ORAL_TABLET | Freq: Once | ORAL | Status: AC
  Administered 2017-08-17: 40 mg via ORAL
  Filled 2017-08-17: qty 1

## 2017-08-17 NOTE — ED Notes (Signed)
Unable to get device interrogated.  Rep Leta Jungling) coming to check device.

## 2017-08-17 NOTE — ED Notes (Signed)
CRITICAL VALUE ALERT  Critical Value:  Troponin 0.04  Date & Time Notied:  08/17/17 at 0940  Provider Notified:  Dr. Lynelle Doctor   Orders Received/Actions taken:

## 2017-08-17 NOTE — ED Triage Notes (Signed)
Weakness times 2 days.  C/o headache and abdominal pain since last night.  Per husband pt had a syncopal episode yesterday and he gave her a glucose pill and she came around after.  States he did not check her sugar. Also per husband pt became confused during the night.

## 2017-08-17 NOTE — Consult Note (Addendum)
CARDIOLOGY CONSULT NOTE    Patient ID: BONNITA Graves; [id number]; [birth year]   Admit date: 08/17/2017 Date of Consult: 08/17/2017  Primary Care Provider: Wilson Singer, MD Primary Cardiologist: Dina Rich, MD Electrophysiologist: Lewayne Bunting, MD  Patient Profile:   Donna Graves is a 66 y.o. female with a hx of chronic systolic heart failure, dry weight around 313 pounds, with most recent echocardiogram revealing LVEF of 25% to 30%, history of ICD in situ (upgrade September 2018, but unable to place LV lead), atrial fibrillation and flutter status post AV node ablation,.  who is being seen today for the evaluation of syncope, with possible acute on chronic CHF at the request of Dr. Rinaldo Ratel, Hospitalist service.   History of Present Illness:   Donna Graves presented to the emergency room after having had a syncopal episode last evening with episodes of confusion and weakness.  She also complained of abdominal pain last evening.  Her husband also reports that she had dry heaves.  The patient's husband reports that she did not sleep well 2 nights ago and was feeling weak and tired the following day sleeping and intervals sitting in her chair.  Approximately 3 PM yesterday she became unresponsive.  Her husband thought it may be related to low blood sugar.  She has a glucose pills in her purse which he gave to her by forcing it into her mouth, and as she came around he gave her orange juice.  The patient resumed her normal level of consciousness and had no further episodes.  However last night she did not sleep well and felt restless and nervous.  On arrival to the emergency room the patient's blood pressure was 154/69, heart rate 73, O2 sat 95%, she was afebrile.  BNP 557.  Blood glucose 144.  Review of other labs revealed hemoglobin of 16.6 with hematocrit of 53.5.  Platelets 149.  There was no leukocytosis.  She was found to be mildly hypokalemic with potassium of 3.4, CO2 33,  creatinine 1.16.  BUN 24.  Troponin 0 0.04.  Chest x-ray revealed AICD, cardiomegaly without pulmonary venous congestion.  No acute pulmonary disease.  EKG revealed AV pacing.  The patient apparently had a second occurrence of unresponsiveness while in the ER that was witnessed by her husband and nurses at bedside.  She came around on her own without intervention.  Medtronic pacemaker interrogation has been requested.  No obvious rhythm disturbance by telemetry.  She has not been found to be hypoglycemic.  The patient denies headache dizziness or palpitations prior to syncopal episodes.  She has been medically compliant.  She states that she just feels "blah"  Past Medical History:  Diagnosis Date  . Atrial fibrillation and flutter (HCC)    History of RFA and ultimately AV node ablation  . Cardiomyopathy (HCC)    LVEF 25-30%  . Chronic systolic heart failure (HCC)   . Depression   . GERD (gastroesophageal reflux disease)   . Gout   . History of cardiac catheterization 07/23/2013   RA 17; RV 43/20 PCWP 27.  LAD normal  LCx  norma  RCA normal.  LVEF 55%  . Hyperlipidemia   . Hypertension   . Hypothyroidism   . 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 Ladona Ridgel  . Type 2 diabetes mellitus (HCC)     Past Surgical History:  Procedure Laterality Date  . ANAL FISSURE REPAIR    . BACK  SURGERY    . BIV UPGRADE N/A 05/23/2017   Procedure: BiVI Upgrade;  Surgeon: Marinus Maw, MD;  Location: [facility] INVASIVE CV LAB;  Service: Cardiovascular;  Laterality: N/A;  . ICD IMPLANT  05/23/2017   biv  . Left toe surgery       Home Medications:  Prior to Admission medications   Medication Sig Start Date End Date Taking? Authorizing Provider  acetaminophen (TYLENOL) 650 MG CR tablet Take 650-1,300 mg by mouth every 8 (eight) hours as needed for pain.   Yes [provider]  acidophilus (RISAQUAD) CAPS capsule Take 1 capsule  by mouth daily. ULTIMATE FLORA PROBIOTIC   Yes [provider]  carvedilol (COREG) 12.5 MG tablet TAKE 12.5 mg  BY MOUTH TWICE DAILY 03/01/17  Yes [provider]  Cholecalciferol (VITAMIN D PO) Take 7,000 Units by mouth daily.   Yes [provider]  dicyclomine (BENTYL) 10 MG capsule Take 10 mg by mouth 3 (three) times daily as needed for spasms.   Yes [provider]  enalapril (VASOTEC) 10 MG tablet Take 10 mg by mouth 2 (two) times daily.    Yes [provider]  escitalopram (LEXAPRO) 5 MG tablet Take 1 tablet by mouth at bedtime. 07/24/17  Yes [provider]  fluticasone (FLONASE) 50 MCG/ACT nasal spray Place 1-2 sprays into both nostrils daily as needed for allergies. 07/17/17  Yes [provider]  glimepiride (AMARYL) 2 MG tablet Take 4 mg by mouth daily. BEFORE A MEAL   Yes [provider]  magnesium oxide (MAG-OX) 400 MG tablet Take 400 mg by mouth daily.   Yes [provider]  metolazone (ZAROXOLYN) 2.5 MG tablet TAKE 1 TABLET BY MOUTH 2 TIMES A WEEK ON TUESDAY AND FRIDAY 07/31/17  Yes Jodelle Gross, NP  Misc Natural Products (BLACK CHERRY CONCENTRATE PO) Take 2 tablets by mouth daily.    Yes [provider]  Multiple Vitamins-Minerals (ALIVE ONCE DAILY WOMENS 50+ PO) Take 1 tablet by mouth daily.   Yes [provider]  omeprazole (PRILOSEC) 20 MG capsule Take 20 mg by mouth every morning.   Yes [provider]  polycarbophil (FIBERCON) 625 MG tablet Take 625 mg by mouth 4 (four) times daily.   Yes [provider]  potassium chloride SA (K-DUR,KLOR-CON) 20 MEQ tablet TAKE 3 TABLETS BY MOUTH EVERY MORNING AND 2 TABLETS BY MOUTH IN THE EVENING(PM) 07/31/17  Yes Jodelle Gross, NP  Red Yeast Rice Extract (RED YEAST RICE PO) Take 2 tablets by mouth daily.    Yes [provider]  simvastatin (ZOCOR) 40 MG tablet Take 40 mg by mouth every evening.    Yes  [provider]  torsemide (DEMADEX) 20 MG tablet Take 4 tablets (80 mg total) 2 (two) times daily by mouth. 07/24/17 10/22/17 Yes Branch, Dorothe Pea, MD    Allergies:    Allergies  Allergen Reactions  . Codeine Rash    Social History:   Social History   Socioeconomic History  . Marital status: Married    Spouse name: Not on file  . Number of children: Not on file  . Years of education: Not on file  . Highest education level: Not on file  Social Needs  . Financial resource strain: Not on file  . Food insecurity - worry: Not on file  . Food insecurity - inability: Not on file  . Transportation needs - medical: Not on file  . Transportation needs - non-medical: Not  on file  Occupational History  . Not on file  Tobacco Use  . Smoking status: Never Smoker  . Smokeless tobacco: Never Used  Substance and Sexual Activity  . Alcohol use: No    Alcohol/week: 0.0 oz  . Drug use: No  . Sexual activity: Not on file  Other Topics Concern  . Not on file  Social History Narrative  . Not on file    Family History:    Family History  Problem Relation Age of Onset  . Diabetes Mother 66  . Heart attack Mother   . Hypertension Mother   . Diabetes Brother 46  . Arthritis Father 30     ROS:  Please see the history of present illness.  ROS  Chronic intermittent leg swelling and abdominal bloating.  All other systems reviewed and negative.  Physical Exam/Data:   Vitals:   08/17/17 0930 08/17/17 1000 08/17/17 1116 08/17/17 1117  BP: (!) 144/70 (!) 145/85 (!) 118/92   Pulse: 74 81 93 74  Resp: 16 (!) 23 (!) 21 (!) 27  Temp:      TempSrc:      SpO2: 94% 95%  97%  Weight:      Height:       No intake or output data in the 24 hours ending 08/17/17 1157 Filed Weights   08/17/17 0824  Weight: (!) 335 lb (152 kg)   Body mass index is 57.5 kg/m.  General: Morbidly obese woman in no acute distress. HEENT: normal Lymph: no adenopathy Neck: Increased girth, difficult  to assess JVP. Endocrine:  No thryomegaly Vascular: No carotid bruits; FA pulses 2+ bilaterally without bruits  Cardiac:  normal S1, S2; RRR; no murmur distant  Lungs:  Clear to auscultation bilaterally, no wheezing, rhonchi or rales  Abd: Obese with pannus. Ext: Mild lower leg edema, 1-2+ and chronic appearing. Musculoskeletal:  No deformities, BUE and BLE strength normal and equal Skin: warm and dry  Neuro:  CNs 2-12 intact, no focal abnormalities noted Psych:  Normal affect   EKG:  The EKG was personally reviewed and demonstrates:  AV pacemaker  Telemetry:  Telemetry was personally reviewed and demonstrates:  AV pacing   Relevant CV Studies:  Echocardiogram 04/19/2017 Left ventricle: The cavity size was normal. Wall thickness was   increased in a pattern of mild LVH. Systolic function was   severely reduced. The estimated ejection fraction was in the   range of 25% to 30%. Diffuse hypokinesis. - Aortic valve: Mildly calcified annulus. Trileaflet; normal   thickness leaflets. Valve area (VTI): 1.82 cm^2. Valve area   (Vmax): 1.79 cm^2. Valve area (Vmean): 1.58 cm^2. - Mitral valve: Mildly calcified annulus. Normal thickness leaflets   . There was mild regurgitation. - Left atrium: The atrium was moderately dilated. - Right ventricle: The cavity size was mildly to moderately   dilated. - Right atrium: The atrium was mildly to moderately dilated. - Technically adequate study.  PPM upgrade 05/23/2017 (Medtronic). Conclusion: Unsuccessful insertion of a left ventricular pacing lead with successful upgrade from a dual-chamber pacemaker to a dual-chamber ICD, with the LV port capped. The patient will be followed carefully, weight loss will be strongly encouraged, and if her heart failure symptoms progressed significantly, we would consider epicardial LV lead placement.  PPM Interrogation 06/20/2017 Conclusion   Wound check appointment. Steri-strips removed. Wound without redness.  Incision edges approximated, wound well healed. Soft hematoma noted, pt instructed to call if does not resolve. Normal device function. Thresholds, sensing,  and impedances consistent  with implant measurements. Device programmed with adaptive on for extra safety margin until 3 month visit. Histogram distribution appropriate for patient and level of activity. No mode switches or high ventricular rates noted. Patient educated about  wound care, arm mobility, lifting restrictions. ROV w/ GT 12/17/2018Elizabeth Watts RN   Laboratory Data:  Chemistry Recent Labs  Lab 08/17/17 0834  NA 140  K 3.4*  CL 94*  CO2 33*  GLUCOSE 152*  BUN 24*  CREATININE 1.16*  CALCIUM 9.7  GFRNONAA 48*  GFRAA 56*  ANIONGAP 13    Recent Labs  Lab 08/17/17 0834  PROT 6.8  ALBUMIN 3.8  AST 49*  ALT 15  ALKPHOS 120  BILITOT 1.7*   Hematology Recent Labs  Lab 08/17/17 0834  WBC 7.1  RBC 5.59*  HGB 16.6*  HCT 53.5*  MCV 95.7  MCH 29.7  MCHC 31.0  RDW 16.0*  PLT 149*   Cardiac Enzymes Recent Labs  Lab 08/17/17 0832  TROPONINI 0.04*   No results for input(s): TROPIPOC in the last 168 hours.  BNP Recent Labs  Lab 08/17/17 0835  BNP 557.0*     Radiology/Studies:  Dg Chest 2 View  Result Date: 08/17/2017 CLINICAL DATA:  Weakness x2 days. EXAM: CHEST  2 VIEW COMPARISON:  Chest x-ray 06/22/2017. FINDINGS: AICD noted stable in stable position. Cardiomegaly with normal pulmonary vascularity. Lung volumes. No acute pulmonary disease. No pleural effusion or pneumothorax. IMPRESSION: 1. AICD noted stable position. Cardiomegaly no pulmonary venous congestion. 2.  Low lung volumes.  No acute pulmonary disease . Electronically Signed   By: Maisie Fus  Register   On: 08/17/2017 09:21    Assessment and Plan:   1.  Syncopal episode: Uncertain etiology at this point.  Medtronic ICD is being interrogated for evaluation of arrhythmias causing this.  Denies any palpitations or device shocks.  The patient is  not significantly hypotensive.  Blood glucose was normal on arrival.  Would recommend checking orthostatic blood pressures.  Although she was seated during these episodes, would like to evaluate for hypotension.  Creatinine was a little elevated on arrival.  She may have some evidence of mild dehydration.  2.  Chronic systolic CHF: Most recent echocardiogram revealed EF of 25% to 30%.  Patient is easily fluid overloaded at home.  However she has been maintaining her her weight with change to torsemide and metolazone twice a week.  Her weight is labile according to her husband  However, weight on admission to ER was 335 pounds.  (On review of most recent office visit on 07/31/2017, her weight was 335 pounds).  May need to reset her dry weight.  She does have some mild lower extremity edema in the dependent position.  As stated above, dry weight is 313 pounds.  There is no evidence of CHF on chest x-ray.  She did take metolazone yesterday as she takes it on Tuesdays and Fridays.  3.  History of atrial fibrillation and flutter: History is somewhat unclear, recent office notes indicate atrial tachycardia status post ablation but this would not necessarily be consistent with history of AV node ablation.  At present she is not anticoagulated.   4. Hypertension: Blood pressure is low normal.  She is currently on enalapril 10 mg twice daily.  Would request orthostatic blood pressures for evaluation once pacemaker has been interrogated.  5.  Diabetes: Currently on Amaryl 2 mg before meals.  Glucose was not substantially elevated on admission.  She has not  eaten yet today nor has she had her medications.  Will defer to hospitalist service for ongoing management during hospitalization.  For questions or updates, please contact [facility] Please consult [email or web address] for contact info under Cardiology/STEMI.   Signed, Bettey MareKathryn M. Liborio NixonLawrence DNP, ANP, AACC  08/17/2017 11:57 AM    Attending note:  Patient  seen and examined.  I reviewed extensive records and updated the chart.  Case discussed with Ms. Lawrence DNP and above note modified.  Donna Graves presents to the hospital with her husband after recent reported fatigue, insomnia for 2 days, intermittent abdominal pain with recently dry heaves, and an episode of unresponsiveness that occurred yesterday afternoon.  The patient's husband thought that she was hypoglycemic, did not check her blood sugar, but did give her a glucose tablet which seemed to help.  She has a history of chronic systolic heart failure with fluctuating weights, her weight is stable compared to her most recent office visit despite adjustment in her medications which now include Demadex and metolazone.  She had another episode of reported unresponsiveness in the ER that was observed, no obvious rhythm change on telemetry, spontaneously resolved.  Interrogation of ICD is pending.  She denies any device shocks or palpitations.  On examination patient is in no distress, sitting on the side of the bed.  Systolic blood pressure 120-150 range and heart rate in the 70s with dual-chamber pacing noted on telemetry which I personally reviewed. She is morbidly obese, 335 pounds.  Lungs exhibit diminished breath sounds but no wheezing.  Cardiac exam reveals RRR without gallop.  Difficult to assess JVP.  Abdomen is protuberant with pannus.  She has chronic appearing lower leg edema, 1-2+.  Lab work shows potassium 3.4, BUN 24, creatinine 1.16, AST 49, ALT 15, BNP 557, troponin I 0.04, hemoglobin 16.6, platelets 149.  I personally reviewed her ECG which shows dual-chamber pacing.  Echocardiogram from August revealed LVEF 25-30% range.  Patient presents to the hospital with constellation of symptoms as discussed above including unresponsiveness/syncope.  Etiology is not yet clear.  Agree with hospital admission for further evaluation.  Recommend ICD interrogation.  Also suggest head CT.  Minimal troponin  I elevation of 0.04 is nonspecific, recommend obtaining trend.  Volume status is difficult to evaluate, this is my first meeting with her.  She is chronically morbidly obese with pannus and intermittent leg edema.  For now would switch to Lasix 80 mg twice daily and assess urine output over the next 24 hours.  Need to follow renal function carefully.  Jonelle SidleSamuel G. McDowell, M.D., F.A.C.C.

## 2017-08-17 NOTE — ED Notes (Signed)
Rep here from [employer]

## 2017-08-17 NOTE — H&P (Signed)
History and Physical    Donna Graves:[id number] DOB: [birth year] DOA: 08/17/2017  PCP: Wilson Singer, MD  Patient coming from: Home  Chief Complaint: Weakness, sick, syncopal episode  HPI: Donna Graves is a 66 y.o. female with medical history significant of atrial fibrillation and flutter (though not on anticoagulation), congestive heart failure (last EF of 25-30%), depression, HLD, HTN presents to the ED from home for weakness, feeling ill and syncopal episode.  Patient reports two days prior to hospital presentation she began feeling ill.  She has been unable to sleep for two nights. Has been unable to lay flat but says that she has not been able to lay flat for some time.  Denies chest pain, but states she has chronic shortness of breath.  She was last seen by the cardiology clinic on 07/31/17 and her diuretics were increased. Patient's husband's bedside and reports that patient has been having dry heaves for the past 2 days. He also reports that upon approximately at 3 PM yesterday she had a brief episode of unresponsiveness which he believed was due to hypoglycemia for which he gave her sugar pill. He states that she appeared to come around after that. She was brought to the emergency department today for further evaluation after she again did not sleep.    Upon review of patient's chart she was admitted last to the hospital on October 4 to October 12 and was discharged after being diuresis roughly 33 L. Her dry weight at that time was 313 pounds. She followed up with her primary cardiologist Dr. Wyline Mood who stopped her Lasix on 07/24/2017 and started her on torsemide 80 mg twice a day as well as metolazone 2.5 mg on Fridays she was then seen on 07/31/2017 by Joni Reining in the cardiology clinic at that time had regained 20 pounds from her hospital discharge. Her metolazone was increased to 2.5 mg on Tuesdays and then again on Fridays and per the patient may need to be considered  for evaluation by advanced heart failure clinic further recommendations given her difficulty and diuresis.  ED Course: In the emergency department patient was found to have a blood pressure of 154/69, and O2 sat 95%, BNP of 557, heart rate of 73, and was hemoconcentrated with H&H of 16.6 over 53.5. Troponin was slightly elevated at 0.04 and chest x-ray showed an AICD, cardiomegaly without pulmonary venous congestion. She did experience a second occurrence of nonresponsiveness while in the emergency department that was witnessed by nurse and husband. This resolved without any intervention. Telemetry did not show any disturbance and rhythm. TR[facility] was asked to admit patient for further evaluation. Cardiology was consult for evaluation and management of heart failure as well as input into patient's possible syncopal episodes.  Review of Systems: As per HPI otherwise 10 point review of systems negative.    Past Medical History:  Diagnosis Date  . Atrial fibrillation and flutter (HCC)    History of RFA and ultimately AV node ablation  . Cardiomyopathy (HCC)    LVEF 25-30%  . Chronic systolic heart failure (HCC)   . Depression   . GERD (gastroesophageal reflux disease)   . Gout   . History of cardiac catheterization 07/23/2013   RA 17; RV 43/20 PCWP 27.  LAD normal  LCx  norma  RCA normal.  LVEF 55%  . Hyperlipidemia   . Hypertension   . Hypothyroidism   . 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 Ladona Ridgelaylor  . Type 2 diabetes mellitus (HCC)     Past Surgical History:  Procedure Laterality Date  . ANAL FISSURE REPAIR    . BACK SURGERY    . BIV UPGRADE N/A 05/23/2017   Procedure: BiVI Upgrade;  Surgeon: Marinus Mawaylor, Gregg W, MD;  [facility];  Service: Cardiovascular;  Laterality: N/A;  . ICD IMPLANT  05/23/2017   biv  . Left toe surgery       reports that  has never smoked. she has never used smokeless tobacco.  She reports that she does not drink alcohol or use drugs.  Allergies  Allergen Reactions  . Codeine Rash    Family History  Problem Relation Age of Onset  . Diabetes Mother 4678  . Heart attack Mother   . Hypertension Mother   . Diabetes Brother 463  . Arthritis Father [phone]     Prior to Admission medications   Medication Sig Start Date End Date Taking? Authorizing Provider  acetaminophen (TYLENOL) 650 MG CR tablet Take 650-1,300 mg by mouth every 8 (eight) hours as needed for pain.   Yes [provider]  acidophilus (RISAQUAD) CAPS capsule Take 1 capsule by mouth daily. ULTIMATE FLORA PROBIOTIC   Yes [provider]  carvedilol (COREG) 12.5 MG tablet TAKE 12.5 mg  BY MOUTH TWICE DAILY 03/01/17  Yes [provider]  Cholecalciferol (VITAMIN D PO) Take 7,000 Units by mouth daily.   Yes [provider]  dicyclomine (BENTYL) 10 MG capsule Take 10 mg by mouth 3 (three) times daily as needed for spasms.   Yes [provider]  enalapril (VASOTEC) 10 MG tablet Take 10 mg by mouth 2 (two) times daily.    Yes [provider]  escitalopram (LEXAPRO) 5 MG tablet Take 1 tablet by mouth at bedtime. 07/24/17  Yes [provider]  fluticasone (FLONASE) 50 MCG/ACT nasal spray Place 1-2 sprays into both nostrils daily as needed for allergies. 07/17/17  Yes [provider]  glimepiride (AMARYL) 2 MG tablet Take 4 mg by mouth daily. BEFORE A MEAL   Yes [provider]  magnesium oxide (MAG-OX) 400 MG tablet Take 400 mg by mouth daily.   Yes [provider]  metolazone (ZAROXOLYN) 2.5 MG tablet TAKE 1 TABLET BY MOUTH 2 TIMES A WEEK ON TUESDAY AND FRIDAY 07/31/17  Yes Jodelle GrossLawrence, Kathryn M, NP  Misc Natural Products (BLACK CHERRY CONCENTRATE PO) Take 2 tablets by mouth daily.    Yes [provider]  Multiple Vitamins-Minerals (ALIVE ONCE DAILY WOMENS 50+ PO) Take 1 tablet by mouth daily.   Yes [provider]    omeprazole (PRILOSEC) 20 MG capsule Take 20 mg by mouth every morning.   Yes [provider]  polycarbophil (FIBERCON) 625 MG tablet Take 625 mg by mouth 4 (four) times daily.   Yes [provider]  potassium chloride SA (K-DUR,KLOR-CON) 20 MEQ tablet TAKE 3 TABLETS BY MOUTH EVERY MORNING AND 2 TABLETS BY MOUTH IN THE EVENING(PM) 07/31/17  Yes Jodelle GrossLawrence, Kathryn M, NP  Red Yeast Rice Extract (RED YEAST RICE PO) Take 2 tablets by mouth daily.    Yes [provider]  simvastatin (ZOCOR) 40 MG tablet Take 40 mg by mouth every evening.    Yes [provider]  torsemide (DEMADEX) 20 MG tablet Take 4 tablets (80 mg total) 2 (two) times daily by mouth. 07/24/17 10/22/17 Yes Branch, Dorothe PeaJonathan F, MD    Physical Exam: Vitals:  08/17/17 0930 08/17/17 1000 08/17/17 1116 08/17/17 1117  BP: (!) 144/70 (!) 145/85 (!) 118/92   Pulse: 74 81 93 74  Resp: 16 (!) 23 (!) 21 (!) 27  Temp:      TempSrc:      SpO2: 94% 95%  97%  Weight:      Height:          Constitutional: NAD, calm, comfortable Vitals:   08/17/17 0930 08/17/17 1000 08/17/17 1116 08/17/17 1117  BP: (!) 144/70 (!) 145/85 (!) 118/92   Pulse: 74 81 93 74  Resp: 16 (!) 23 (!) 21 (!) 27  Temp:      TempSrc:      SpO2: 94% 95%  97%  Weight:      Height:       Eyes: PERRL, lids and conjunctivae normal ENMT: Mucous membranes are moist. Posterior pharynx clear of any exudate or lesions.Normal dentition.  Neck: normal, supple, no masses, no thyromegaly, unable to evaluate JVD due to body habitus Respiratory: Distant lung sounds Cardiovascular: Regular rate and rhythm, no murmurs / rubs / gallops.  Pitting edema to midshin bilaterally however unable to evaluate whether or not this is chronic as this is my first time evaluating patient. 2+ pedal pulses.  Pitting g edema of the pannus Abdomen: Obese, no tenderness, no masses palpated. No hepatosplenomegaly. Bowel sounds positive.  Musculoskeletal: no clubbing /  cyanosis. No joint deformity upper and lower extremities. Good ROM, no contractures. Normal muscle tone.  Skin: no rashes, lesions, ulcers. No induration Neurologic: CN 2-12 grossly intact. Sensation intact, DTR normal. Strength 5/5 in all 4.  Psychiatric: Normal judgment and insight. Alert and oriented x 3. Normal mood.   Labs on Admission: I have personally reviewed following labs and imaging studies  CBC: Recent Labs  Lab 08/17/17 0834  WBC 7.1  NEUTROABS 4.5  HGB 16.6*  HCT 53.5*  MCV 95.7  PLT 149*   Basic Metabolic Panel: Recent Labs  Lab 08/17/17 0834  NA 140  K 3.4*  CL 94*  CO2 33*  GLUCOSE 152*  BUN 24*  CREATININE 1.16*  CALCIUM 9.7   GFR: Estimated Creatinine Clearance: 71.4 mL/min (A) (by C-G formula based on SCr of 1.16 mg/dL (H)). Liver Function Tests: Recent Labs  Lab 08/17/17 0834  AST 49*  ALT 15  ALKPHOS 120  BILITOT 1.7*  PROT 6.8  ALBUMIN 3.8   Recent Labs  Lab 08/17/17 0834  LIPASE 37   No results for input(s): AMMONIA in the last 168 hours. Coagulation Profile: No results for input(s): INR, PROTIME in the last 168 hours. Cardiac Enzymes: Recent Labs  Lab 08/17/17 0832  TROPONINI 0.04*   BNP (last 3 results) No results for input(s): PROBNP in the last 8760 hours. HbA1C: No results for input(s): HGBA1C in the last 72 hours. CBG: Recent Labs  Lab 08/17/17 0832 08/17/17 1002  GLUCAP 149* 144*   Lipid Profile: No results for input(s): CHOL, HDL, LDLCALC, TRIG, CHOLHDL, LDLDIRECT in the last 72 hours. Thyroid Function Tests: No results for input(s): TSH, T4TOTAL, FREET4, T3FREE, THYROIDAB in the last 72 hours. Anemia Panel: No results for input(s): VITAMINB12, FOLATE, FERRITIN, TIBC, IRON, RETICCTPCT in the last 72 hours. Urine analysis:    Component Value Date/Time   COLORURINE STRAW (A) 08/17/2017 0829   APPEARANCEUR CLEAR 08/17/2017 0829   LABSPEC 1.006 08/17/2017 0829   PHURINE 7.0 08/17/2017 0829   GLUCOSEU  NEGATIVE 08/17/2017 0829   HGBUR NEGATIVE 08/17/2017 1610  BILIRUBINUR NEGATIVE 08/17/2017 0829   KETONESUR NEGATIVE 08/17/2017 0829   PROTEINUR NEGATIVE 08/17/2017 0829   NITRITE NEGATIVE 08/17/2017 0829   LEUKOCYTESUR NEGATIVE 08/17/2017 0829   Sepsis Labs: !!!!!!!!!!!!!!!!!!!!!!!!!!!!!!!!!!!!!!!!!!!! @LABRCNTIP (procalcitonin:4,lacticidven:4) )No results found for this or any previous visit (from the past 240 hour(s)).   Radiological Exams on Admission: Dg Chest 2 View  Result Date: 08/17/2017 CLINICAL DATA:  Weakness x2 days. EXAM: CHEST  2 VIEW COMPARISON:  Chest x-ray 06/22/2017. FINDINGS: AICD noted stable in stable position. Cardiomegaly with normal pulmonary vascularity. Lung volumes. No acute pulmonary disease. No pleural effusion or pneumothorax. IMPRESSION: 1. AICD noted stable position. Cardiomegaly no pulmonary venous congestion. 2.  Low lung volumes.  No acute pulmonary disease . Electronically Signed   By: Maisie Fus  Register   On: 08/17/2017 09:21    EKG: Independently reviewed. paced  Assessment/Plan Principal Problem:   Acute on chronic congestive heart failure (HCC) Active Problems:   Near syncope   Type II diabetes mellitus (HCC)   AF (paroxysmal atrial fibrillation) (HCC)   Elevated troponin     Acute and chronic congestive heart failure -Last echocardiogram showing ejection fraction of 25-30% -Cardiology consulted -Recommend Lasix 80 mg BID - Had patient waiting on standing scale weight on admission of 318 pounds -Strict I's and O's -Daily weights -Fluid restriction of 1500 mL's daily next line-serial electrolyte panels to monitor kidney function -Elevated troponin could have etiology based in heart failure -Hold beta-blocker due to acute decompensation -We will restart enalapril  Diabetes type 2 - SSI -CBG before meals at bedtime -Hold glimepiride given concerns for hypoglycemia  Paroxysmal atrial fibrillation - Telemetry monitoring -Patient not  on anticoagulation at home  GERD -Restart Prilosec  Altered level of consciousness -CT head ordered -Consider EEG if patient has another unresponsive episode -AICD to be interrogated   DVT prophylaxis: lovenox  Code Status: DNR  Family Communication:  husband Disposition Plan: pending diuresis and cardiac workup Consults called: Cardiology  Admission status: inpatient, telemetry   Katrinka Blazing MD [facility] Pager [phone]  If 7PM-7AM, please contact night-coverage [email or web address] Password [email or web address]  08/17/2017, 11:56 AM

## 2017-08-17 NOTE — ED Provider Notes (Signed)
[facility] EMERGENCY DEPARTMENT Provider Note   CSN: [id number] Arrival date & time: 08/17/17  0813     History   Chief Complaint Chief Complaint  Patient presents with  . Fatigue    HPI Donna Graves is a 66 y.o. female.  HPI Patient is a history of congestive heart failure, diabetes as well as several other medical problems.  Yesterday she started to feel weak and fatigued.  Last evening she had an episode where she passed out and was confused.  Patient's husband did not check her blood sugar but she has had trouble with hypoglycemia in the past so he gave her sugar pill.  She regained consciousness after that.  Patient has been having trouble with nausea and has dry heaves a few times but has not vomited.  She has had generalized abdominal pain since last night.  It is mild to moderate nature.  She denies any trouble with diarrhea.  No dysuria or constipation.  She denies any fevers.  She denies any chest pain.  She does feel short of breath but she has a history of just of heart failure.  This does not seem to be acutely worse.  Patient states her doctors have increased her fluid pills recently.  Her mouth feels very dry. Past Medical History:  Diagnosis Date  . CHF (congestive heart failure) (HCC)    Echo in 2017 LVEF 30 to 35%  . Depression   . Diabetes mellitus without complication (HCC)   . 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 GERD (gastroesophageal reflux disease)   . Gout   . H/O right and left heart catheterization 07/23/2013   RA 17; RV 43/20 PCWP 27.  LAD normal  LCx  norma  RCA normal.  LVEF 55%  . Hyperlipidemia   . Hypertension   . Hypothyroidism   . Pacemaker 09/10/2013   AV node ablation and PPM in 2005; Dual chamber pacer in 2014 [employer]tr[employer]    Patient Active Problem List   Diagnosis Date Noted  . Chronic systolic heart failure (HCC) 05/23/2017  . Atypical chest pain   . Elevated troponin   . Acute on  chronic systolic CHF (congestive heart failure) (HCC) 03/30/2017  . Near syncope 03/30/2017  . Type II diabetes mellitus (HCC) 03/30/2017  . AF (paroxysmal atrial fibrillation) (HCC) 03/30/2017  . Essential hypertension 03/30/2017  . CAD (coronary artery disease) 03/30/2017    Past Surgical History:  Procedure Laterality Date  . ANAL FISSURE REPAIR    . BACK SURGERY    . BIV UPGRADE N/A 05/23/2017   Procedure: BiVI Upgrade;  Surgeon: Marinus Maw, MD;  Location: [facility] INVASIVE CV LAB;  Service: Cardiovascular;  Laterality: N/A;  . ICD IMPLANT  05/23/2017   biv  . left toe surgery    . pace maker      OB History    No data available       Home Medications    Prior to Admission medications   Medication Sig Start Date End Date Taking? Authorizing Provider  acetaminophen (TYLENOL) 650 MG CR tablet Take 650-1,300 mg by mouth every 8 (eight) hours as needed for pain.   Yes [provider]  acidophilus (RISAQUAD) CAPS capsule Take 1 capsule by mouth daily. ULTIMATE FLORA PROBIOTIC   Yes [provider]  carvedilol (COREG) 12.5 MG tablet TAKE 12.5 mg  BY MOUTH TWICE DAILY 03/01/17  Yes [provider]  Cholecalciferol (VITAMIN  D PO) Take 7,000 Units by mouth daily.   Yes [provider]  dicyclomine (BENTYL) 10 MG capsule Take 10 mg by mouth 3 (three) times daily as needed for spasms.   Yes [provider]  enalapril (VASOTEC) 10 MG tablet Take 10 mg by mouth 2 (two) times daily.    Yes [provider]  escitalopram (LEXAPRO) 5 MG tablet Take 1 tablet by mouth at bedtime. 07/24/17  Yes [provider]  fluticasone (FLONASE) 50 MCG/ACT nasal spray Place 1-2 sprays into both nostrils daily as needed for allergies. 07/17/17  Yes [provider]  glimepiride (AMARYL) 2 MG tablet Take 4 mg by mouth daily. BEFORE A MEAL   Yes [provider]  magnesium oxide (MAG-OX) 400 MG tablet Take 400 mg by mouth daily.   Yes  [provider]  metolazone (ZAROXOLYN) 2.5 MG tablet TAKE 1 TABLET BY MOUTH 2 TIMES A WEEK ON TUESDAY AND FRIDAY 07/31/17  Yes Jodelle Gross, NP  Misc Natural Products (BLACK CHERRY CONCENTRATE PO) Take 2 tablets by mouth daily.    Yes [provider]  Multiple Vitamins-Minerals (ALIVE ONCE DAILY WOMENS 50+ PO) Take 1 tablet by mouth daily.   Yes [provider]  omeprazole (PRILOSEC) 20 MG capsule Take 20 mg by mouth every morning.   Yes [provider]  polycarbophil (FIBERCON) 625 MG tablet Take 625 mg by mouth 4 (four) times daily.   Yes [provider]  potassium chloride SA (K-DUR,KLOR-CON) 20 MEQ tablet TAKE 3 TABLETS BY MOUTH EVERY MORNING AND 2 TABLETS BY MOUTH IN THE EVENING(PM) 07/31/17  Yes Jodelle Gross, NP  Red Yeast Rice Extract (RED YEAST RICE PO) Take 2 tablets by mouth daily.    Yes [provider]  simvastatin (ZOCOR) 40 MG tablet Take 40 mg by mouth every evening.    Yes [provider]  torsemide (DEMADEX) 20 MG tablet Take 4 tablets (80 mg total) 2 (two) times daily by mouth. 07/24/17 10/22/17 Yes Branch, Dorothe Pea, MD    Family History Family History  Problem Relation Age of Onset  . Diabetes Mother 25  . Heart attack Mother   . Hypertension Mother   . Diabetes Brother 37  . Arthritis Father 4    Social History Social History   Tobacco Use  . Smoking status: Never Smoker  . Smokeless tobacco: Never Used  Substance Use Topics  . Alcohol use: No    Alcohol/week: 0.0 oz  . Drug use: No     Allergies   Codeine   Review of Systems Review of Systems  Constitutional: Positive for fatigue. Negative for fever.  Neurological: Positive for weakness.  All other systems reviewed and are negative.    Physical Exam Updated Vital Signs BP (!) 145/85   Pulse 81   Temp 98 F (36.7 C) (Oral)   Resp (!) 23   Ht 1.626 m (5\' 4" )   Wt (!) 152 kg (335 lb)   SpO2 95%   BMI 57.50 kg/m    Physical Exam  Constitutional:  Morbidly obese  HENT:  Head: Normocephalic and atraumatic.  Right Ear: External ear normal.  Left Ear: External ear normal.  Mucous membranes are dry  Eyes: Conjunctivae are normal. Right eye exhibits no discharge. Left eye exhibits no discharge. No scleral icterus.  Neck: Neck supple. No tracheal deviation present.  Cardiovascular: Normal rate, regular rhythm and intact distal pulses.  Pulmonary/Chest: Effort normal and breath sounds normal. No  stridor. No respiratory distress. She has no wheezes. She has no rales.  Abdominal: Soft. Bowel sounds are normal. She exhibits no distension. There is tenderness. There is no rebound and no guarding. No hernia.  Extremely large abdominal pannus, no erythema or induration noted, mild diffuse tenderness, difficult to assess if there is any hernia or mass,   Musculoskeletal: She exhibits no edema or tenderness.  No significant pedal edema noted  Neurological: She is alert. No cranial nerve deficit (no facial droop, extraocular movements intact, no slurred speech) or sensory deficit. She exhibits normal muscle tone. She displays no seizure activity. Coordination normal.  Generalized weakness but no focal deficits  Skin: Skin is warm and dry. No rash noted. She is not diaphoretic.  Psychiatric: She has a normal mood and affect.  Nursing note and vitals reviewed.    ED Treatments / Results  Labs (all labs ordered are listed, but only abnormal results are displayed) Labs Reviewed  URINALYSIS, ROUTINE W REFLEX MICROSCOPIC - Abnormal; Notable for the following components:      Result Value   Color, Urine STRAW (*)    All other components within normal limits  CBC WITH DIFFERENTIAL/PLATELET - Abnormal; Notable for the following components:   RBC 5.59 (*)    Hemoglobin 16.6 (*)    HCT 53.5 (*)    RDW 16.0 (*)    Platelets 149 (*)    All other components within normal limits  COMPREHENSIVE METABOLIC PANEL -  Abnormal; Notable for the following components:   Potassium 3.4 (*)    Chloride 94 (*)    CO2 33 (*)    Glucose, Bld 152 (*)    BUN 24 (*)    Creatinine, Ser 1.16 (*)    AST 49 (*)    Total Bilirubin 1.7 (*)    GFR calc non Af Amer 48 (*)    GFR calc Af Amer 56 (*)    All other components within normal limits  BRAIN NATRIURETIC PEPTIDE - Abnormal; Notable for the following components:   B Natriuretic Peptide 557.0 (*)    All other components within normal limits  TROPONIN I - Abnormal; Notable for the following components:   Troponin I 0.04 (*)    All other components within normal limits  CBG MONITORING, ED - Abnormal; Notable for the following components:   Glucose-Capillary 149 (*)    All other components within normal limits  CBG MONITORING, ED - Abnormal; Notable for the following components:   Glucose-Capillary 144 (*)    All other components within normal limits  LIPASE, BLOOD    EKG  EKG Interpretation  Date/Time:  Thursday August 17 2017 08:27:55 EST Ventricular Rate:  71 PR Interval:    QRS Duration: 135 QT Interval:  426 QTC Calculation: 463 R Axis:   -29 Text Interpretation:  Atrial-ventricular dual-paced complexes No further analysis attempted due to paced rhythm Confirmed by Linwood Dibbles [id number]) on 08/17/2017 8:40:39 AM       Radiology Dg Chest 2 View  Result Date: 08/17/2017 CLINICAL DATA:  Weakness x2 days. EXAM: CHEST  2 VIEW COMPARISON:  Chest x-ray 06/22/2017. FINDINGS: AICD noted stable in stable position. Cardiomegaly with normal pulmonary vascularity. Lung volumes. No acute pulmonary disease. No pleural effusion or pneumothorax. IMPRESSION: 1. AICD noted stable position. Cardiomegaly no pulmonary venous congestion. 2.  Low lung volumes.  No acute pulmonary disease . Electronically Signed   By: Maisie Fus  Register   On: 08/17/2017 09:21    Procedures Procedures (  including critical care time)  Medications Ordered in ED Medications  0.9 %  sodium  chloride infusion ( Intravenous New Bag/Given 08/17/17 0852)  ondansetron (ZOFRAN) injection 4 mg (4 mg Intravenous Given 08/17/17 0852)  acetaminophen (TYLENOL) tablet 650 mg (650 mg Oral Given 08/17/17 1008)     Initial Impression / Assessment and Plan / ED Course  I have reviewed the triage vital signs and the nursing notes.  Pertinent labs & imaging results that were available during my care of the patient were reviewed by me and considered in my medical decision making (see chart for details).  Clinical Course as of Aug 17 1045  Thu Aug 17, 2017  1043 Pacemaker interrogation was ordered.  The representative is going to have to come in  [JK]    Clinical Course User Index [JK] Linwood DibblesKnapp, Mellisa Arshad, MD  Patient presents to the emergency room for fatigue weakness syncopal episode.  Family thought it was related to her blood sugar however it was not taken.  Her blood sugar is not low here today.  Patient does have a history of congestive heart failure.  Laboratory tests are notable for an increased BNP, troponin and BUN and creatinine.  She does not have any lower extremity edema but she does appear to have edema in her abdominal pannus.  No signs of pulmonary venous congestion on chest x-ray.  Symptoms are more suggestive of right heart failure.  Considering her syncopal episode, history of CHF and elevated troponin, I will consult the medical service for admission and further monitoring.  Patient's denied any chest pain right now I doubt acute cardiac ischemia but we will need to monitor closely. Final Clinical Impressions(s) / ED Diagnoses   Final diagnoses:  Syncope, unspecified syncope type  Fatigue, unspecified type  Elevated troponin      Linwood DibblesKnapp, Abrahm Mancia, MD 08/17/17 1056

## 2017-08-17 NOTE — ED Notes (Signed)
In room attempting to interrogate pacemaker.   Pt no distress.

## 2017-08-18 DIAGNOSIS — R748 Abnormal levels of other serum enzymes: Secondary | ICD-10-CM

## 2017-08-18 LAB — TROPONIN I
TROPONIN I: 0.08 ng/mL — AB (ref <0.03)
Troponin I: 0.09 ng/mL (ref <0.03)

## 2017-08-18 LAB — GLUCOSE, CAPILLARY
GLUCOSE-CAPILLARY: 126 mg/dL — AB (ref 65–99)
GLUCOSE-CAPILLARY: 146 mg/dL — AB (ref 65–99)
Glucose-Capillary: 155 mg/dL — ABNORMAL HIGH (ref 65–99)
Glucose-Capillary: 153 mg/dL — ABNORMAL HIGH (ref 65–99)

## 2017-08-18 LAB — BASIC METABOLIC PANEL
Anion gap: 14 (ref 5–15)
BUN: 21 mg/dL — AB (ref 6–20)
CO2: 25 mmol/L (ref 22–32)
CREATININE: 1.2 mg/dL — AB (ref 0.44–1.00)
Calcium: 9.8 mg/dL (ref 8.9–10.3)
Chloride: 99 mmol/L — ABNORMAL LOW (ref 101–111)
GFR calc Af Amer: 54 mL/min — ABNORMAL LOW (ref >60)
GFR, EST NON AFRICAN AMERICAN: 46 mL/min — AB (ref >60)
Glucose, Bld: 135 mg/dL — ABNORMAL HIGH (ref 65–99)
Potassium: 3.4 mmol/L — ABNORMAL LOW (ref 3.5–5.1)
SODIUM: 138 mmol/L (ref 135–145)

## 2017-08-18 LAB — HEMOGLOBIN A1C
Hgb A1c MFr Bld: 7.8 % — ABNORMAL HIGH (ref 4.8–5.6)
MEAN PLASMA GLUCOSE: 177.16 mg/dL

## 2017-08-18 MED ORDER — SIMVASTATIN 20 MG PO TABS
40.0000 mg | ORAL_TABLET | Freq: Every evening | ORAL | Status: DC
  Administered 2017-08-18 – 2017-08-19 (×2): 40 mg via ORAL
  Filled 2017-08-18 (×2): qty 2

## 2017-08-18 MED ORDER — POTASSIUM CHLORIDE CRYS ER 20 MEQ PO TBCR
40.0000 meq | EXTENDED_RELEASE_TABLET | Freq: Once | ORAL | Status: AC
  Administered 2017-08-18: 40 meq via ORAL
  Filled 2017-08-18: qty 2

## 2017-08-18 MED ORDER — TORSEMIDE 20 MG PO TABS
80.0000 mg | ORAL_TABLET | Freq: Two times a day (BID) | ORAL | Status: DC
  Administered 2017-08-18 – 2017-08-19 (×2): 80 mg via ORAL
  Filled 2017-08-18 (×2): qty 4

## 2017-08-18 MED ORDER — DIPHENHYDRAMINE HCL 25 MG PO CAPS
25.0000 mg | ORAL_CAPSULE | Freq: Once | ORAL | Status: AC | PRN
  Administered 2017-08-18: 25 mg via ORAL
  Filled 2017-08-18: qty 1

## 2017-08-18 MED ORDER — CARVEDILOL 12.5 MG PO TABS
12.5000 mg | ORAL_TABLET | Freq: Two times a day (BID) | ORAL | Status: DC
  Administered 2017-08-18 – 2017-08-19 (×2): 12.5 mg via ORAL
  Filled 2017-08-18 (×2): qty 1

## 2017-08-18 MED ORDER — CITALOPRAM HYDROBROMIDE 20 MG PO TABS
20.0000 mg | ORAL_TABLET | Freq: Every day | ORAL | Status: DC
Start: 2017-08-18 — End: 2017-08-19
  Administered 2017-08-18 – 2017-08-19 (×2): 20 mg via ORAL
  Filled 2017-08-18 (×2): qty 1

## 2017-08-18 MED ORDER — POTASSIUM CHLORIDE CRYS ER 20 MEQ PO TBCR
20.0000 meq | EXTENDED_RELEASE_TABLET | Freq: Every day | ORAL | Status: DC
  Filled 2017-08-18: qty 1

## 2017-08-18 NOTE — Progress Notes (Signed)
CRITICAL VALUE ALERT  Critical Value: tropoin 0.8  Date & Time Notied: 08-18-2017  Provider Notified: opyd, md  Orders Received/Actions taken:no new orders received at this time. Patient resting comfortably. md will order troponin later in day.

## 2017-08-18 NOTE — Progress Notes (Signed)
Nutrition Education Note  RD consulted for nutrition education regarding acute on chronic onset CHF.  RD provided "Low Sodium Nutrition Therapy" handout from the [employer]. Included a information on weight loss for her as well.   Reviewed patient's dietary recall. Discussed with husband primarily. The patient says she is already following a low salt diet at home.   Provided examples on ways to decrease sodium intake in diet.   Discouraged intake of processed foods and use of salt shaker. Shared a recipe for herb blend to season foods that is sodium-free.   Encouraged fresh fruits and vegetables as well as whole grain sources of carbohydrates to maximize fiber intake.   RD discussed why it is important for patient to adhere to diet recommendations, and emphasized the role of fluids, foods to avoid, and importance of weighing self daily. Husband says the patient weighs daily at home.  Teach back method used.  Expect good compliance.  Body mass index is 54.74 kg/m. Pt meets criteria for morbid obesity based on current BMI.  Current diet order is 2 gram, patient is consuming approximately 100% of meals at this time. Labs and medications reviewed.   Recommendations: Weight loss would be beneficial for patient IF she is ready to make lifestyle changes. Referral to [facility] wound be warranted.    Royann Shivers MS,RD,CSG,LDN Office: [phone] Pager: [phone]

## 2017-08-18 NOTE — Care Management Note (Addendum)
Case Management Note  Patient Details  Name: Donna Graves MRN: [id number] Date of Birth: 1951-07-17  Subjective/Objective:  Chart reviewed.         Admitted with CHF. Pt form home, lives with husband, ind with ADL's. Has PCP, transportation and insurance with drug coverage. Has scale and reports compliance with diet. Dietician has reviewed diet education with patient.   Action/Plan: Anticipate DC home. CM following for needs.   Expected Discharge Date:    08/21/2017              Expected Discharge Plan:     In-House Referral:     Discharge planning Services  CM Consult  Post Acute Care Choice:    Choice offered to:     DME Arranged:    DME Agency:     HH Arranged:    HH Agency:     Status of Service:  In process, will continue to follow  If discussed at Long Length of Stay Meetings, dates discussed:    Additional Comments:  Alfhild Partch, [name], RN 08/18/2017, 2:39 PM

## 2017-08-18 NOTE — Progress Notes (Signed)
PROGRESS NOTE    Donna Graves  DVV:[id number] DOB: March 04, 1951 DOA: 08/17/2017 PCP: Wilson Singer, MD    Brief Narrative:  Donna Graves is a 66 y.o. female with medical history significant of atrial fibrillation and flutter (though not on anticoagulation), congestive heart failure (last EF of 25-30%), depression, HLD, HTN presents to the ED from home for weakness, feeling ill and syncopal episode.  Patient reports two days prior to hospital presentation she began feeling ill.  She has been unable to sleep for two nights. Has been unable to lay flat but says that she has not been able to lay flat for some time.  Denies chest pain, but states she has chronic shortness of breath.  She was last seen by the cardiology clinic on 07/31/17 and her diuretics were increased. Patient's husband's bedside and reports that patient has been having dry heaves for the past 2 days. He also reports that upon approximately at 3 PM yesterday she had a brief episode of unresponsiveness which he believed was due to hypoglycemia for which he gave her sugar pill. He states that she appeared to come around after that. She was brought to the emergency department today for further evaluation after she again did not sleep.   Assessment & Plan:   Principal Problem:   Acute on chronic congestive heart failure (HCC) Active Problems:   Near syncope   Type II diabetes mellitus (HCC)   AF (paroxysmal atrial fibrillation) (HCC)   Elevated troponin   Acute and chronic congestive heart failure -Last echocardiogram showing ejection fraction of 25-30% -Cardiology consulted -Transition back to torsemide at home dose -Weight stable at 318 pounds -Strict I's and O's (patient has had +125 mL since admission) -Daily weights -Fluid restriction of 1500 mL's daily next line -Slight increase in creatinine this morning -restart beta-blocker -Hold enalapril given patient's bump in creatinine  Diabetes type 2 - SSI -CBG  before meals at bedtime -Hold glimepiride given concerns for hypoglycemia  Paroxysmal atrial fibrillation - Telemetry monitoring -Patient not on anticoagulation at home  GERD -Restart Prilosec  Altered level of consciousness -CT head ordered -AICD interrogated with no signs of malfunction - No episodes of unresponsiveness since admission -Patient's husband voices concern over increased stressors at home and depression: Spiritual care consult ordered    DVT prophylaxis: Lovenox Code Status: DNR Family Communication: Husband is bedside Disposition Plan: Likely discharge back to her previous home environment   Consultants:   Cardiology  Procedures:   None  Antimicrobials:   None   Subjective: Patient seen this morning.  She is sitting in her chair looking out the window.  Her husband is bedside.  Her husband voices that patient has had increasing stressors at home due to his health issues and her health issues.  He voices that he is concerned that she is overly stressed and depressed.  He states she was started on antidepressant by her primary care physician and is wondering this could be increased.  Patient denies any shortness of breath but voices she still feels very weak and tired.  Objective: Vitals:   08/17/17 2042 08/17/17 2130 08/18/17 0520 08/18/17 1400  BP:  119/75 123/83 (!) 140/54  Pulse:  74 80 77  Resp:  20 20 20   Temp:  98.2 F (36.8 C) 97.6 F (36.4 C) 98.4 F (36.9 C)  TempSrc:  Oral Oral Oral  SpO2: 93% 92% 91% 95%  Weight:   (!) 144.7 kg (318 lb 14.4 oz)  Height:        Intake/Output Summary (Last 24 hours) at 08/18/2017 1616 Last data filed at 08/18/2017 1500 Gross per 24 hour  Intake 1705.42 ml  Output 2000 ml  Net -294.58 ml   Filed Weights   08/17/17 1247 08/17/17 1449 08/18/17 0520  Weight: (!) 141.7 kg (312 lb 6.4 oz) (!) 144.7 kg (318 lb 14.4 oz) (!) 144.7 kg (318 lb 14.4 oz)    Examination:  General exam: Appears calm  and comfortable  Respiratory system: Distant lung sounds due to body habitus respiratory effort normal. Cardiovascular system: S1 & S2 heard, RRR. No JVD, murmurs, rubs, gallops or clicks.  Questionable edema of bilateral lower extremities from the knee distally. Gastrointestinal system: Abdomen is obese, nondistended, soft and nontender.  Edema of the pannus is again appreciated; no organomegaly or masses felt. Normal bowel sounds heard. Central nervous system: Alert and oriented. No focal neurological deficits. Extremities: Symmetric 5 x 5 power. Psychiatry: Judgement and insight appear normal.  Appears sad.     Data Reviewed: I have personally reviewed following labs and imaging studies  CBC: Recent Labs  Lab 08/17/17 0834  WBC 7.1  NEUTROABS 4.5  HGB 16.6*  HCT 53.5*  MCV 95.7  PLT 149*   Basic Metabolic Panel: Recent Labs  Lab 08/17/17 0834 08/18/17 0329  NA 140 138  K 3.4* 3.4*  CL 94* 99*  CO2 33* 25  GLUCOSE 152* 135*  BUN 24* 21*  CREATININE 1.16* 1.20*  CALCIUM 9.7 9.8   GFR: Estimated Creatinine Clearance: 66.9 mL/min (A) (by C-G formula based on SCr of 1.2 mg/dL (H)). Liver Function Tests: Recent Labs  Lab 08/17/17 0834  AST 49*  ALT 15  ALKPHOS 120  BILITOT 1.7*  PROT 6.8  ALBUMIN 3.8   Recent Labs  Lab 08/17/17 0834  LIPASE 37   No results for input(s): AMMONIA in the last 168 hours. Coagulation Profile: No results for input(s): INR, PROTIME in the last 168 hours. Cardiac Enzymes: Recent Labs  Lab 08/17/17 0832 08/17/17 1624 08/17/17 2214 08/18/17 0329  TROPONINI 0.04* 0.04* 0.06* 0.08*   BNP (last 3 results) No results for input(s): PROBNP in the last 8760 hours. HbA1C: Recent Labs    08/18/17 0329  HGBA1C 7.8*   CBG: Recent Labs  Lab 08/17/17 1002 08/17/17 1732 08/17/17 2102 08/18/17 0756 08/18/17 1118  GLUCAP 144* 205* 142* 126* 146*   Lipid Profile: No results for input(s): CHOL, HDL, LDLCALC, TRIG, CHOLHDL,  LDLDIRECT in the last 72 hours. Thyroid Function Tests: No results for input(s): TSH, T4TOTAL, FREET4, T3FREE, THYROIDAB in the last 72 hours. Anemia Panel: No results for input(s): VITAMINB12, FOLATE, FERRITIN, TIBC, IRON, RETICCTPCT in the last 72 hours. Sepsis Labs: No results for input(s): PROCALCITON, LATICACIDVEN in the last 168 hours.  No results found for this or any previous visit (from the past 240 hour(s)).       Radiology Studies: Dg Chest 2 View  Result Date: 08/17/2017 CLINICAL DATA:  Weakness x2 days. EXAM: CHEST  2 VIEW COMPARISON:  Chest x-ray 06/22/2017. FINDINGS: AICD noted stable in stable position. Cardiomegaly with normal pulmonary vascularity. Lung volumes. No acute pulmonary disease. No pleural effusion or pneumothorax. IMPRESSION: 1. AICD noted stable position. Cardiomegaly no pulmonary venous congestion. 2.  Low lung volumes.  No acute pulmonary disease . Electronically Signed   By: Maisie Fushomas  Register   On: 08/17/2017 09:21   Ct Head Wo Contrast  Result Date: 08/17/2017 CLINICAL DATA:  Altered mental  status and weakness. EXAM: CT HEAD WITHOUT CONTRAST TECHNIQUE: Contiguous axial images were obtained from the base of the skull through the vertex without intravenous contrast. COMPARISON:  None. FINDINGS: Brain: No evidence of acute infarction, hemorrhage, hydrocephalus, extra-axial collection or mass lesion/mass effect. Mild age related cerebral atrophy. Vascular: No hyperdense vessel or unexpected calcification. Skull: Normal. Negative for fracture or focal lesion. Sinuses/Orbits: No acute finding. Other: None. IMPRESSION: No acute intracranial abnormality. Electronically Signed   By: Obie Dredge M.D.   On: 08/17/2017 14:48        Scheduled Meds: . enoxaparin (LOVENOX) injection  70 mg Subcutaneous Q24H  . furosemide  80 mg Oral BID  . insulin aspart  0-9 Units Subcutaneous TID WC  . pantoprazole  40 mg Oral Daily  . [START ON 08/19/2017] potassium  chloride  20 mEq Oral Daily   Continuous Infusions:   LOS: 1 day    Time spent: 35 minutes    Katrinka Blazing, MD [facility] Pager [phone]  If 7PM-7AM, please contact night-coverage [email or web address] Password [email or web address] 08/18/2017, 4:16 PM

## 2017-08-18 NOTE — Progress Notes (Signed)
CRITICAL VALUE ALERT  Critical Value:  Troponin - 0.09  Date & Time Notied:  11/30 1800   Provider Notified: Dr Rinaldo Ratel  Orders Received/Actions taken: NA

## 2017-08-18 NOTE — Progress Notes (Signed)
Progress Note  Patient Name: Donna MassedSandra H Graves Date of Encounter: 08/18/2017  Primary Cardiologist: Dina RichJonathan Branch. MD  Subjective   Complaining of legs and feet cramping.  Did not sleep well.  No shortness of breath at rest.  No dizziness.  Inpatient Medications    Scheduled Meds: . enoxaparin (LOVENOX) injection  70 mg Subcutaneous Q24H  . furosemide  80 mg Oral BID  . insulin aspart  0-9 Units Subcutaneous TID WC  . pantoprazole  40 mg Oral Daily  . potassium chloride  40 mEq Oral Once   Followed by  . [START ON 08/19/2017] potassium chloride  20 mEq Oral Daily    PRN Meds: acetaminophen **OR** acetaminophen, dicyclomine, ondansetron **OR** ondansetron (ZOFRAN) IV, polyethylene glycol   Vital Signs    Vitals:   08/17/17 1650 08/17/17 2042 08/17/17 2130 08/18/17 0520  BP: 122/73  119/75 123/83  Pulse: 75  74 80  Resp: (!) 22  20 20   Temp:   98.2 F (36.8 C) 97.6 F (36.4 C)  TempSrc:   Oral Oral  SpO2: 96% 93% 92% 91%  Weight:    (!) 318 lb 14.4 oz (144.7 kg)  Height:        Intake/Output Summary (Last 24 hours) at 08/18/2017 1012 Last data filed at 08/18/2017 0700 Gross per 24 hour  Intake 1225.42 ml  Output 1100 ml  Net 125.42 ml   Filed Weights   08/17/17 1247 08/17/17 1449 08/18/17 0520  Weight: (!) 312 lb 6.4 oz (141.7 kg) (!) 318 lb 14.4 oz (144.7 kg) (!) 318 lb 14.4 oz (144.7 kg)    Telemetry    AV pacing - Personally Reviewed  Physical Exam   GEN:  Morbidly obese in no distress.  Neck:  Difficult to assess JVP. Cardiac: RRR, no gallops.  Respiratory: Clear to auscultation bilaterally. GI: Pannus is prominent and mildly edematous  MS:  Mild lower leg edema; No deformity. Neuro:  Nonfocal  Psych: Normal affect   Labs    Chemistry Recent Labs  Lab 08/17/17 0834 08/18/17 0329  NA 140 138  K 3.4* 3.4*  CL 94* 99*  CO2 33* 25  GLUCOSE 152* 135*  BUN 24* 21*  CREATININE 1.16* 1.20*  CALCIUM 9.7 9.8  PROT 6.8  --   ALBUMIN 3.8   --   AST 49*  --   ALT 15  --   ALKPHOS 120  --   BILITOT 1.7*  --   GFRNONAA 48* 46*  GFRAA 56* 54*  ANIONGAP 13 14     Hematology Recent Labs  Lab 08/17/17 0834  WBC 7.1  RBC 5.59*  HGB 16.6*  HCT 53.5*  MCV 95.7  MCH 29.7  MCHC 31.0  RDW 16.0*  PLT 149*    Cardiac Enzymes Recent Labs  Lab 08/17/17 0832 08/17/17 1624 08/17/17 2214 08/18/17 0329  TROPONINI 0.04* 0.04* 0.06* 0.08*   No results for input(s): TROPIPOC in the last 168 hours.   BNP Recent Labs  Lab 08/17/17 0835  BNP 557.0*     Radiology    Dg Chest 2 View  Result Date: 08/17/2017 CLINICAL DATA:  Weakness x2 days. EXAM: CHEST  2 VIEW COMPARISON:  Chest x-ray 06/22/2017. FINDINGS: AICD noted stable in stable position. Cardiomegaly with normal pulmonary vascularity. Lung volumes. No acute pulmonary disease. No pleural effusion or pneumothorax. IMPRESSION: 1. AICD noted stable position. Cardiomegaly no pulmonary venous congestion. 2.  Low lung volumes.  No acute pulmonary disease . Electronically Signed  ByMaisie Fus  Register   On: 08/17/2017 09:21   Ct Head Wo Contrast  Result Date: 08/17/2017 CLINICAL DATA:  Altered mental status and weakness. EXAM: CT HEAD WITHOUT CONTRAST TECHNIQUE: Contiguous axial images were obtained from the base of the skull through the vertex without intravenous contrast. COMPARISON:  None. FINDINGS: Brain: No evidence of acute infarction, hemorrhage, hydrocephalus, extra-axial collection or mass lesion/mass effect. Mild age related cerebral atrophy. Vascular: No hyperdense vessel or unexpected calcification. Skull: Normal. Negative for fracture or focal lesion. Sinuses/Orbits: No acute finding. Other: None. IMPRESSION: No acute intracranial abnormality. Electronically Signed   By: Obie Dredge M.D.   On: 08/17/2017 14:48    Cardiac Studies   Echocardiogram 04/19/2017 Left ventricle: The cavity size was normal. Wall thickness was increased in a pattern of mild LVH.  Systolic function was severely reduced. The estimated ejection fraction was in the range of 25% to 30%. Diffuse hypokinesis. - Aortic valve: Mildly calcified annulus. Trileaflet; normal thickness leaflets. Valve area (VTI): 1.82 cm^2. Valve area (Vmax): 1.79 cm^2. Valve area (Vmean): 1.58 cm^2. - Mitral valve: Mildly calcified annulus. Normal thickness leaflets . There was mild regurgitation. - Left atrium: The atrium was moderately dilated. - Right ventricle: The cavity size was mildly to moderately dilated. - Right atrium: The atrium was mildly to moderately dilated. - Technically adequate study.  PPM upgrade 05/23/2017 (Medtronic). Conclusion: Unsuccessful insertion of a left ventricular pacing lead with successful upgrade from a dual-chamber pacemaker to a dual-chamber ICD, with the LV port capped. The patient will be followed carefully, weight loss will be strongly encouraged, and if her heart failure symptoms progressed significantly, we would consider epicardial LV lead placement.  Patient Profile     66 y.o. female with a hx of chronic systolic heart failure, dry weight around 313 pounds, with most recent echocardiogram revealing LVEF of 25% to 30%, history of ICD in situ (upgrade September 2018, but unable to place LV lead), atrial fibrillation and flutter status post AV node ablation, who is being seen for the evaluation of syncope, with possible acute on chronic CHF.  Assessment & Plan    1. Syncopal Episode: Medtronic ICD interrogated. No events, no therapies or AT/AF.  Consider orthostatics.  No obvious arrhythmogenic cause.  2. Chronic Systolic CHF: Most recent evaluation per echocardiogram revealed EF of 25%-30%. Wt is less that stated weight in ER. Now 318, closer to "dry weight" of 313 lbs. She continues on lasix 80 mg BID.  Potassium is 3.4. Will give one dose of 40 mEq today and continue 20 mEq daily. Review of home dose has her on 60 mEq in the am and 40 mEq in  the pm. This is likely due to use of metolazone twice a week. Creatinine 1.20 this am.   3. Hypertension: Continue ACE inhibitor and BB. BP is low normal.  4. Hx of Diabetes: Per PCP team. May need adjustments if she is having periods of hypoglycemia. Glucose was not found to be low on admission.  5. Anxiety and Depression: Husband is concerned that she may need higher dose of antidepressant as she has been having more nervousness and crying spells at home. Defer to PCP.   Signed, Bettey Mare. Liborio Nixon, ANP, AACC   08/18/2017, 10:12 AM     Attending note:  Patient seen and examined.  Reviewed interval hospital course and discussed the case with Ms. Lyman Bishop DNP as well as Dr. Rinaldo Ratel.  Patient states that she did not sleep well, has had  some leg cramps.  Husband also indicates that she seems agitated and unsettled, notes that she has had recent adjustments in antidepressant medications.  Medtronic ICD was interrogated yesterday without any obvious events.  There were no therapies, no AT/AF.  Telemetry shows no arrhythmias.  She has had no syncopal events under observation.  On examination this morning she appears fatigued, seated in chair.  Abdomen remains protuberant with pannus, her weight is 318 pounds which is closer to reported dry weight.  Intake and output are not complete.  Lungs exhibit clear breath sounds.  She does have mild lower leg edema.  Lab work shows potassium 3.4, BUN 21, creatinine 1.20, troponin I level 0.04-0.08, trend not particularly consistent with ACS.  No obvious cardiac etiology for recent reported syncopal/unresponsive events.  We will continue to search for other causes.  Rhythm has been stable and she had no events by device interrogation.  Suggest checking orthostatics to be complete, although these episodes have occurred while she has been seated.  Head CT was negative for acute event.  From a volume perspective she looks to be near baseline, although morbid  obesity complicates this assessment.  With bump in creatinine, would not over diurese and consider changing back to her baseline oral outpatient regimen.  As noted above, husband also has concerns about patient's mental status and depression/anxiety symptoms.  Jonelle Sidle, M.D., F.A.C.C.

## 2017-08-19 LAB — BASIC METABOLIC PANEL WITH GFR
Anion gap: 14 (ref 5–15)
Anion gap: 9 (ref 5–15)
BUN: 19 mg/dL (ref 6–20)
BUN: 19 mg/dL (ref 6–20)
CO2: 28 mmol/L (ref 22–32)
CO2: 28 mmol/L (ref 22–32)
Calcium: 8.8 mg/dL — ABNORMAL LOW (ref 8.9–10.3)
Calcium: 8.6 mg/dL — ABNORMAL LOW (ref 8.9–10.3)
Chloride: 96 mmol/L — ABNORMAL LOW (ref 101–111)
Chloride: 97 mmol/L — ABNORMAL LOW (ref 101–111)
Creatinine, Ser: 1.06 mg/dL — ABNORMAL HIGH (ref 0.44–1.00)
Creatinine, Ser: 1.03 mg/dL — ABNORMAL HIGH (ref 0.44–1.00)
GFR calc Af Amer: >60 mL/min
GFR calc Af Amer: >60 mL/min
GFR calc non Af Amer: 54 mL/min — ABNORMAL LOW
GFR calc non Af Amer: 56 mL/min — ABNORMAL LOW
Glucose, Bld: 146 mg/dL — ABNORMAL HIGH (ref 65–99)
Glucose, Bld: 212 mg/dL — ABNORMAL HIGH (ref 65–99)
Potassium: 2.6 mmol/L — CL (ref 3.5–5.1)
Potassium: 3.4 mmol/L — ABNORMAL LOW (ref 3.5–5.1)
Sodium: 138 mmol/L (ref 135–145)
Sodium: 134 mmol/L — ABNORMAL LOW (ref 135–145)

## 2017-08-19 LAB — GLUCOSE, CAPILLARY
GLUCOSE-CAPILLARY: 176 mg/dL — AB (ref 65–99)
GLUCOSE-CAPILLARY: 191 mg/dL — AB (ref 65–99)
Glucose-Capillary: 199 mg/dL — ABNORMAL HIGH (ref 65–99)

## 2017-08-19 LAB — BASIC METABOLIC PANEL
Anion gap: 12 (ref 5–15)
BUN: 19 mg/dL (ref 6–20)
CALCIUM: 8.9 mg/dL (ref 8.9–10.3)
CO2: 28 mmol/L (ref 22–32)
CREATININE: 0.97 mg/dL (ref 0.44–1.00)
Chloride: 96 mmol/L — ABNORMAL LOW (ref 101–111)
GFR, EST NON AFRICAN AMERICAN: 60 mL/min — AB (ref >60)
Glucose, Bld: 173 mg/dL — ABNORMAL HIGH (ref 65–99)
Potassium: 2.8 mmol/L — ABNORMAL LOW (ref 3.5–5.1)
SODIUM: 136 mmol/L (ref 135–145)

## 2017-08-19 LAB — MAGNESIUM: Magnesium: 1.7 mg/dL (ref 1.7–2.4)

## 2017-08-19 LAB — TROPONIN I: Troponin I: 0.09 ng/mL (ref <0.03)

## 2017-08-19 MED ORDER — POTASSIUM CHLORIDE 10 MEQ/100ML IV SOLN
10.0000 meq | INTRAVENOUS | Status: AC
  Administered 2017-08-19 (×2): 10 meq via INTRAVENOUS
  Filled 2017-08-19 (×2): qty 100

## 2017-08-19 MED ORDER — POTASSIUM CHLORIDE 10 MEQ/100ML IV SOLN
10.0000 meq | INTRAVENOUS | Status: AC
  Administered 2017-08-19 (×4): 10 meq via INTRAVENOUS
  Filled 2017-08-19 (×3): qty 100

## 2017-08-19 MED ORDER — POTASSIUM CHLORIDE CRYS ER 20 MEQ PO TBCR
40.0000 meq | EXTENDED_RELEASE_TABLET | Freq: Three times a day (TID) | ORAL | Status: DC
  Administered 2017-08-19 (×2): 40 meq via ORAL
  Filled 2017-08-19 (×2): qty 2

## 2017-08-19 NOTE — Discharge Summary (Signed)
Physician Discharge Summary  Donna Graves CBS:[id number] DOB: [birth year] DOA: 08/17/2017  PCP: Wilson Singer, MD  Admit date: 08/17/2017 Discharge date: 08/19/2017  Admitted From: Home Disposition: Home  Recommendations for Outpatient Follow-up:  1. Follow up with PCP in 1-2 weeks 2. Follow-up with cardiology at first available appointment 3. Make appointment to discuss weight management 4. Please obtain BMP/CBC in one week   Home Health: None Equipment/Devices: None  Discharge Condition: Stable CODE STATUS: DNR Diet recommendation: Heart healthy fluid restricted  Brief/Interim Summary: Donna Graves a 66 y.o.femalewith medical history significant ofatrial fibrillation and flutter (though not on anticoagulation), congestive heart failure (last EF of 25-30%), depression, HLD, HTN presents to the ED from home for weakness, feeling ill and syncopal episode. Patient reports two days prior to hospital presentation she began feeling ill. She has been unable to sleep for two nights. Has been unable to lay flat but says that she has not been able to lay flat for some time. Denies chest pain, but states she has chronic shortness of breath. She was last seen by the cardiology clinic on 07/31/17 and her diuretics were increased. Patient's husband's bedside and reports that patient has been having dry heaves for the past 2 days. He also reports that upon approximately at 3 PM yesterday she had a brief episode of unresponsiveness which he believed was due to hypoglycemia for which he gave her sugar pill. He states that she appeared to come around after that. She was brought to the emergency department today for further evaluation after she again did not sleep.  Patient was seen and evaluated by cardiology who encourage patient to continue her outpatient regimen of diuretics.  Her fluid status was difficult to assess secondary to her body habitus.  She had one episode of  unresponsiveness in the emergency department however her telemetry monitor did not show any change in her heart rate or rhythm.  The thought was that this could be some other type of psychogenic process.  Patient was diuresed gently during her hospitalization.  On 08/19/2017 she was found to be hypokalemic and her potassium was adequately replaced prior to discharge.  On discharge her potassium was found to be 3.4.  She was instructed to follow-up with her primary care physician as well as her cardiologist.  She voices that she has an appointment with the bariatric surgeon on December 11 and is nervous and feels guilty about having to go discuss bariatric surgery.  At time of discharge she voices that she felt slightly better than she did at this admission after discussing her concerns about both her weight and her stressors in her life.  Discharge Diagnoses:  Principal Problem:   Acute on chronic congestive heart failure (HCC) Active Problems:   Near syncope   Type II diabetes mellitus (HCC)   AF (paroxysmal atrial fibrillation) (HCC)   Elevated troponin    Discharge Instructions  Discharge Instructions    (HEART FAILURE PATIENTS) Call MD:  Anytime you have any of the following symptoms: 1) 3 pound weight gain in 24 hours or 5 pounds in 1 week 2) shortness of breath, with or without a dry hacking cough 3) swelling in the hands, feet or stomach 4) if you have to sleep on extra pillows at night in order to breathe.   Complete by:  As directed    Call MD for:  difficulty breathing, headache or visual disturbances   Complete by:  As directed    Call MD  for:  extreme fatigue   Complete by:  As directed    Call MD for:  hives   Complete by:  As directed    Call MD for:  persistant dizziness or light-headedness   Complete by:  As directed    Call MD for:  persistant nausea and vomiting   Complete by:  As directed    Call MD for:  severe uncontrolled pain   Complete by:  As directed    Call MD  for:  temperature >100.4   Complete by:  As directed    Diet - low sodium heart healthy   Complete by:  As directed    Discharge instructions   Complete by:  As directed    Continue home medications as prescribed Follow up with your PCP and your cardiologist at earliest available appointment   Increase activity slowly   Complete by:  As directed      Allergies as of 08/19/2017      Reactions   Codeine Rash      Medication List    TAKE these medications   acetaminophen 650 MG CR tablet Commonly known as:  TYLENOL Take 650-1,300 mg by mouth every 8 (eight) hours as needed for pain.   acidophilus Caps capsule Take 1 capsule by mouth daily. ULTIMATE FLORA PROBIOTIC   ALIVE ONCE DAILY WOMENS 50+ PO Take 1 tablet by mouth daily.   BLACK CHERRY CONCENTRATE PO Take 2 tablets by mouth daily.   carvedilol 12.5 MG tablet Commonly known as:  COREG TAKE 12.5 mg  BY MOUTH TWICE DAILY   dicyclomine 10 MG capsule Commonly known as:  BENTYL Take 10 mg by mouth 3 (three) times daily as needed for spasms.   enalapril 10 MG tablet Commonly known as:  VASOTEC Take 10 mg by mouth 2 (two) times daily.   escitalopram 5 MG tablet Commonly known as:  LEXAPRO Take 1 tablet by mouth at bedtime.   fluticasone 50 MCG/ACT nasal spray Commonly known as:  FLONASE Place 1-2 sprays into both nostrils daily as needed for allergies.   glimepiride 2 MG tablet Commonly known as:  AMARYL Take 4 mg by mouth daily. BEFORE A MEAL   magnesium oxide 400 MG tablet Commonly known as:  MAG-OX Take 400 mg by mouth daily.   metolazone 2.5 MG tablet Commonly known as:  ZAROXOLYN TAKE 1 TABLET BY MOUTH 2 TIMES A WEEK ON TUESDAY AND FRIDAY   omeprazole 20 MG capsule Commonly known as:  PRILOSEC Take 20 mg by mouth every morning.   polycarbophil 625 MG tablet Commonly known as:  FIBERCON Take 625 mg by mouth 4 (four) times daily.   potassium chloride SA 20 MEQ tablet Commonly known as:   K-DUR,KLOR-CON TAKE 3 TABLETS BY MOUTH EVERY MORNING AND 2 TABLETS BY MOUTH IN THE EVENING(PM)   RED YEAST RICE PO Take 2 tablets by mouth daily.   simvastatin 40 MG tablet Commonly known as:  ZOCOR Take 40 mg by mouth every evening.   torsemide 20 MG tablet Commonly known as:  DEMADEX Take 4 tablets (80 mg total) 2 (two) times daily by mouth.   VITAMIN D PO Take 7,000 Units by mouth daily.      Follow-up Information    Wilson SingerGosrani, Nimish C, MD. Schedule an appointment as soon as possible for a visit in 2 week(s).   Specialty:  Internal Medicine Contact information: [address] [place] [address] [phone]  Quillian Quince D, MD. Schedule an appointment as soon as possible for a visit in 1 month(s).   Specialty:  Family Medicine Contact information: [address] [place] [address] [phone]        Antoine Poche, MD. Schedule an appointment as soon as possible for a visit in 3 week(s).   Specialty:  Cardiology Contact information: [address] [place] [address] [phone]          Allergies  Allergen Reactions  . Codeine Rash    Consultations:  Cardiology    Procedures/Studies: Dg Chest 2 View  Result Date: 08/17/2017 CLINICAL DATA:  Weakness x2 days. EXAM: CHEST  2 VIEW COMPARISON:  Chest x-ray 06/22/2017. FINDINGS: AICD noted stable in stable position. Cardiomegaly with normal pulmonary vascularity. Lung volumes. No acute pulmonary disease. No pleural effusion or pneumothorax. IMPRESSION: 1. AICD noted stable position. Cardiomegaly no pulmonary venous congestion. 2.  Low lung volumes.  No acute pulmonary disease . Electronically Signed   By: Maisie Fus  Register   On: 08/17/2017 09:21   Ct Head Wo Contrast  Result Date: 08/17/2017 CLINICAL DATA:  Altered mental status and weakness. EXAM: CT HEAD WITHOUT CONTRAST TECHNIQUE: Contiguous axial images were obtained from the base of the skull  through the vertex without intravenous contrast. COMPARISON:  None. FINDINGS: Brain: No evidence of acute infarction, hemorrhage, hydrocephalus, extra-axial collection or mass lesion/mass effect. Mild age related cerebral atrophy. Vascular: No hyperdense vessel or unexpected calcification. Skull: Normal. Negative for fracture or focal lesion. Sinuses/Orbits: No acute finding. Other: None. IMPRESSION: No acute intracranial abnormality. Electronically Signed   By: Obie Dredge M.D.   On: 08/17/2017 14:48      Subjective: Patient is tearful on exam today.  She voices that she has a scheduled appointment to see the bariatric surgeons at the beginning of December but that she feels very guilty and ashamed that she needs to get surgery to deal with her weight issues.  She voices that she feels as though that is keeping her from being able to do a lot of the things she wants to do.  She feels that she is able to go home today and says that her weakness is only somewhat better.  She says she is stressed.  Discharge Exam: Vitals:   08/19/17 1617 08/19/17 1638  BP: (!) 104/22 (!) 118/58  Pulse: 62   Resp: 18   Temp:    SpO2: 94%    Vitals:   08/19/17 0542 08/19/17 1300 08/19/17 1617 08/19/17 1638  BP: 112/77 (!) 86/59 (!) 104/22 (!) 118/58  Pulse: 70 79 62   Resp: 20 18 18    Temp: 97.8 F (36.6 C) 98.2 F (36.8 C)    TempSrc: Oral Oral    SpO2:   94%   Weight: (!) 144.6 kg (318 lb 11.2 oz)     Height:        General: Pt is alert, awake, not in acute distress Cardiovascular: RRR, S1/S2 +, no rubs, no gallops Respiratory: CTA bilaterally, no wheezing, no rhonchi Abdominal: Soft, morbidly obese, NT, ND, bowel sounds +, pitting edema of the pannus Extremities: Pitting edema of the lower extremities to mid shin bilaterally, lower extremities do not appear swollen    The results of significant diagnostics from this hospitalization (including imaging, microbiology, ancillary and laboratory)  are listed below for reference.     Microbiology: No results found for this or any previous visit (from the past 240 hour(s)).  Labs: BNP (last 3 results) Recent Labs    03/30/17 2027 06/22/17 2149 08/17/17 0835  BNP 288.0* 379.0* 557.0*   Basic Metabolic Panel: Recent Labs  Lab 08/17/17 0834 08/18/17 0329 08/19/17 0554 08/19/17 1140 08/19/17 1144 08/19/17 1514  NA 140 138 138 136  --  134*  K 3.4* 3.4* 2.6* 2.8*  --  3.4*  CL 94* 99* 96* 96*  --  97*  CO2 33* 25 28 28   --  28  GLUCOSE 152* 135* 146* 173*  --  212*  BUN 24* 21* 19 19  --  19  CREATININE 1.16* 1.20* 1.06* 0.97  --  1.03*  CALCIUM 9.7 9.8 8.8* 8.9  --  8.6*  MG  --   --   --   --  1.7  --    Liver Function Tests: Recent Labs  Lab 08/17/17 0834  AST 49*  ALT 15  ALKPHOS 120  BILITOT 1.7*  PROT 6.8  ALBUMIN 3.8   Recent Labs  Lab 08/17/17 0834  LIPASE 37   No results for input(s): AMMONIA in the last 168 hours. CBC: Recent Labs  Lab 08/17/17 0834  WBC 7.1  NEUTROABS 4.5  HGB 16.6*  HCT 53.5*  MCV 95.7  PLT 149*   Cardiac Enzymes: Recent Labs  Lab 08/17/17 1624 08/17/17 2214 08/18/17 0329 08/18/17 1630 08/18/17 2246  TROPONINI 0.04* 0.06* 0.08* 0.09* 0.09*   BNP: Invalid input(s): POCBNP CBG: Recent Labs  Lab 08/18/17 1118 08/18/17 1633 08/18/17 2146 08/19/17 0735 08/19/17 1125  GLUCAP 146* 155* 153* 176* 191*   D-Dimer No results for input(s): DDIMER in the last 72 hours. Hgb A1c Recent Labs    08/18/17 0329  HGBA1C 7.8*   Lipid Profile No results for input(s): CHOL, HDL, LDLCALC, TRIG, CHOLHDL, LDLDIRECT in the last 72 hours. Thyroid function studies No results for input(s): TSH, T4TOTAL, T3FREE, THYROIDAB in the last 72 hours.  Invalid input(s): FREET3 Anemia work up No results for input(s): VITAMINB12, FOLATE, FERRITIN, TIBC, IRON, RETICCTPCT in the last 72 hours. Urinalysis    Component Value Date/Time   COLORURINE STRAW (A) 08/17/2017 0829    APPEARANCEUR CLEAR 08/17/2017 0829   LABSPEC 1.006 08/17/2017 0829   PHURINE 7.0 08/17/2017 0829   GLUCOSEU NEGATIVE 08/17/2017 0829   HGBUR NEGATIVE 08/17/2017 0829   BILIRUBINUR NEGATIVE 08/17/2017 0829   KETONESUR NEGATIVE 08/17/2017 0829   PROTEINUR NEGATIVE 08/17/2017 0829   NITRITE NEGATIVE 08/17/2017 0829   LEUKOCYTESUR NEGATIVE 08/17/2017 0829   Sepsis Labs Invalid input(s): PROCALCITONIN,  WBC,  LACTICIDVEN Microbiology No results found for this or any previous visit (from the past 240 hour(s)).   Time coordinating discharge: 35 minutes  SIGNED:   Katrinka Blazing, MD  [facility] 08/19/2017, 4:44 PM Pager [phone] If 7PM-7AM, please contact night-coverage [email or web address] Password [email or web address]

## 2017-08-19 NOTE — Progress Notes (Signed)
CRITICAL VALUE ALERT  Critical Value:  Potassium 2.6  Date & Time Notied:  08/19/2017  Provider Notified: Dr. Rinaldo Ratel  Orders Received/Actions taken: Received order to administer 4 runs of potassium via IV

## 2017-08-19 NOTE — Progress Notes (Signed)
Pt's IV catheter removed and intact. Pt's IV site clean dry and intact. Discharge instructions including medications and follow up appointment were reviewed and discussed with patient. All questions were answered and no further questions at this time. Pt in stable condition and in no acute distress at time of discharge. Pt will be escorted by nurse tech.

## 2017-08-24 ENCOUNTER — Ambulatory Visit (INDEPENDENT_AMBULATORY_CARE_PROVIDER_SITE_OTHER): Payer: Medicare Other | Admitting: Cardiology

## 2017-08-24 ENCOUNTER — Ambulatory Visit (INDEPENDENT_AMBULATORY_CARE_PROVIDER_SITE_OTHER): Payer: Medicare Other

## 2017-08-24 ENCOUNTER — Encounter: Payer: Self-pay | Admitting: Cardiology

## 2017-08-24 VITALS — BP 123/85 | HR 80 | Ht 64.0 in | Wt 325.2 lb

## 2017-08-24 DIAGNOSIS — I5022 Chronic systolic (congestive) heart failure: Secondary | ICD-10-CM

## 2017-08-24 DIAGNOSIS — I2583 Coronary atherosclerosis due to lipid rich plaque: Secondary | ICD-10-CM

## 2017-08-24 DIAGNOSIS — Z95810 Presence of automatic (implantable) cardiac defibrillator: Secondary | ICD-10-CM | POA: Diagnosis not present

## 2017-08-24 DIAGNOSIS — I2510 Atherosclerotic heart disease of native coronary artery without angina pectoris: Secondary | ICD-10-CM | POA: Diagnosis not present

## 2017-08-24 DIAGNOSIS — I471 Supraventricular tachycardia: Secondary | ICD-10-CM

## 2017-08-24 DIAGNOSIS — R55 Syncope and collapse: Secondary | ICD-10-CM

## 2017-08-24 NOTE — Progress Notes (Signed)
Clinical Summary Donna Graves is a 66 y.o.female seen today for follow up of the following medical problems.   1. Chronic systolic HF - From clinic notes originally followed in [place]. Apparently had normal cath in early 2000s, LVEF around that time was 30-35%. - 07/2013 [facility]: CI 2, mean PA 27, PCWP 27, nonobstructive CAD - - BiV upgrade 05/2017, from notes unable to place LV lead.  - 04/2017 echo LVEF 25-30%  - admit 06/2017 with acute on chronic systolic HF. Negative 33L per discharge summary, weight 359 on admit to 313 on discharge.  - has had some issues with medication compliance, particulary her diuretics  - home weights around 325 lbs. Has had some abdominal distension, LE edema. Worsening SOB/DOE.  - compliant with diuretics, lasix 160mg  bid and metolazone 2.5mg  on Friday  - home range between 318-325 lbs range - compliant with diuretics. Swelling is up and down. Breathing has improved.   - recent discharge 08/19/17 weight 318 lbs.  - since discharge no recurrent edema, weights stable at home  2. Atrial tachycardia - from prior cardiology notes s/p ablation - from prior hospital notes history was unclear and thought possibly to have been an aflutter ablation  - she denies any recent palpitations   3. Syncope - admission 07/2017. Device check was normal.  - no recurrent symptoms since discharge.  Past Medical History:  Diagnosis Date  . Atrial fibrillation and flutter (HCC)    History of RFA and ultimately AV node ablation  . Cardiomyopathy (HCC)    LVEF 25-30%  . Chronic systolic heart failure (HCC)   . Depression   . GERD (gastroesophageal reflux disease)   . Gout   . History of cardiac catheterization 07/23/2013   RA 17; RV 43/20 PCWP 27.  LAD normal  LCx  norma  RCA normal.  LVEF 55%  . Hyperlipidemia   . Hypertension   . Hypothyroidism   . 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 Ladona Ridgelaylor  . Type 2 diabetes mellitus (HCC)      Allergies  Allergen Reactions  . Codeine Rash     Current Outpatient Medications  Medication Sig Dispense Refill  . acetaminophen (TYLENOL) 650 MG CR tablet Take 650-1,300 mg by mouth every 8 (eight) hours as needed for pain.    Marland Kitchen. acidophilus (RISAQUAD) CAPS capsule Take 1 capsule by mouth daily. ULTIMATE FLORA PROBIOTIC    . carvedilol (COREG) 12.5 MG tablet TAKE 12.5 mg  BY MOUTH TWICE DAILY  1  . Cholecalciferol (VITAMIN D PO) Take 7,000 Units by mouth daily.    Marland Kitchen. dicyclomine (BENTYL) 10 MG capsule Take 10 mg by mouth 3 (three) times daily as needed for spasms.    . enalapril (VASOTEC) 10 MG tablet Take 10 mg by mouth 2 (two) times daily.     Marland Kitchen. escitalopram (LEXAPRO) 5 MG tablet Take 1 tablet by mouth at bedtime.  2  . fluticasone (FLONASE) 50 MCG/ACT nasal spray Place 1-2 sprays into both nostrils daily as needed for allergies.  0  . glimepiride (AMARYL) 2 MG tablet Take 4 mg by mouth daily. BEFORE A MEAL    . magnesium oxide (MAG-OX) 400 MG tablet Take 400 mg by mouth daily.    . metolazone (ZAROXOLYN) 2.5 MG tablet TAKE 1 TABLET BY MOUTH 2 TIMES A WEEK ON TUESDAY AND FRIDAY 25 tablet 3  . Misc Natural Products (BLACK CHERRY CONCENTRATE PO)  Take 2 tablets by mouth daily.     . Multiple Vitamins-Minerals (ALIVE ONCE DAILY WOMENS 50+ PO) Take 1 tablet by mouth daily.    Marland Kitchen omeprazole (PRILOSEC) 20 MG capsule Take 20 mg by mouth every morning.    . polycarbophil (FIBERCON) 625 MG tablet Take 625 mg by mouth 4 (four) times daily.    . potassium chloride SA (K-DUR,KLOR-CON) 20 MEQ tablet TAKE 3 TABLETS BY MOUTH EVERY MORNING AND 2 TABLETS BY MOUTH IN THE EVENING(PM) 450 tablet 6  . Red Yeast Rice Extract (RED YEAST RICE PO) Take 2 tablets by mouth daily.     . simvastatin (ZOCOR) 40 MG tablet Take 40 mg by mouth every evening.     . torsemide (DEMADEX) 20 MG tablet Take 4 tablets (80 mg total) 2 (two) times daily by  mouth. 240 tablet 11   No current facility-administered medications for this visit.      Past Surgical History:  Procedure Laterality Date  . ANAL FISSURE REPAIR    . BACK SURGERY    . BIV UPGRADE N/A 05/23/2017   Procedure: BiVI Upgrade;  Surgeon: Marinus Maw, MD;  Location[facility] INVASIVE CV LAB;  Service: Cardiovascular;  Laterality: N/A;  . ICD IMPLANT  05/23/2017   biv  . Left toe surgery       Allergies  Allergen Reactions  . Codeine Rash      Family History  Problem Relation Age of Onset  . Diabetes Mother 47  . Heart attack Mother   . Hypertension Mother   . Diabetes Brother 32  . Arthritis Father 30     Social History Ms. Broussard reports that  has never smoked. she has never used smokeless tobacco. Ms. Cannady reports that she does not drink alcohol.   Review of Systems CONSTITUTIONAL: No weight loss, fever, chills, weakness or fatigue.  HEENT: Eyes: No visual loss, blurred vision, double vision or yellow sclerae.No hearing loss, sneezing, congestion, runny nose or sore throat.  SKIN: No rash or itching.  CARDIOVASCULAR: no chest pain, no palpitations RESPIRATORY: No shortness of breath, cough or sputum.  GASTROINTESTINAL: No anorexia, nausea, vomiting or diarrhea. No abdominal pain or blood.  GENITOURINARY: No burning on urination, no polyuria NEUROLOGICAL: No headache, dizziness, syncope, paralysis, ataxia, numbness or tingling in the extremities. No change in bowel or bladder control.  MUSCULOSKELETAL: No muscle, back pain, joint pain or stiffness.  LYMPHATICS: No enlarged nodes. No history of splenectomy.  PSYCHIATRIC: No history of depression or anxiety.  ENDOCRINOLOGIC: No reports of sweating, cold or heat intolerance. No polyuria or polydipsia.  Marland Kitchen   Physical Examination Vitals:   08/24/17 1312  BP: 123/85  Pulse: 80  SpO2: 96%   Vitals:   08/24/17 1312  Weight: (!) 325 lb 3.2 oz (147.5 kg)  Height: 5\' 4"  (1.626 m)    Gen: resting  comfortably, no acute distress HEENT: no scleral icterus, pupils equal round and reactive, no palptable cervical adenopathy,  CV: RRR, no m/r/g, no jvd Resp: Clear to auscultation bilaterally GI: abdomen is soft, non-tender, non-distended, normal bowel sounds, no hepatosplenomegaly MSK: extremities are warm, no edema.  Skin: warm, no rash Neuro:  no focal deficits Psych: appropriate affect   Diagnostic Studies 02/2017 echo: LVEF 25-30%, anteroseptal hypokinesis, mild RV dysfunction, PASP 29    Assessment and Plan  1. Chronic systolic HF -appears euvolemic by exam - continue current diuretics, repeat BMET/Mg  2. Atrial tachycardia - asymptomatic, continue current meds  3. Syncope -  no evidence of cardiac etiolgy during recent admission, continue to monitor.    F/u 6 weeks   Antoine Poche, M.D.

## 2017-08-24 NOTE — Progress Notes (Signed)
EPIC Encounter for ICM Monitoring  Patient Name: Donna Graves is a 66 y.o. female Date: 08/24/2017 Primary Care Physican: Wilson Singer, MD Primary Cardiologist: Wyline Mood Electrophysiologist: Ladona Ridgel Dry Weight: 325 lbs  Bi-V Pacing:   96.1%       1st ICM remote.  Heart Failure questions reviewed, pt report shortness of breath, at night, has improved since hospitalization.  She still feels weak.  Discharge weight on 12/1 was 318 lbs and weight at home today 318 lbs.      Thoracic impedance abnormal suggesting fluid accumulation 10/25 to 11/12, 11/17 to 11/29 and 12/1 and almost back at baseline today.  Prescribed dosage: Torsemide 20 mg take 4 tablets (80 mg total) twice a day.  Metolazone 2.5 mg take 1 tablet 2 times a week on Tuesday & Friday. Potassium 20 mEq take 3 tablets every AM and 2 tablets every PM  Recommendations: No changes.  Patient being seen in office by Dr Wyline Mood today.  Follow-up plan: ICM clinic phone appointment on 09/07/2017 to recheck fluid levels.    Copy of ICM check sent to Dr. Wyline Mood and Dr. Ladona Ridgel.   3 month ICM trend: 08/24/2017    1 Year ICM trend:       Karie Soda, RN 08/24/2017 12:06 PM

## 2017-08-24 NOTE — Patient Instructions (Signed)
Your physician recommends that you schedule a follow-up appointment in: 6 WEEKS WITH DR BRANCH   Your physician recommends that you continue on your current medications as directed. Please refer to the Current Medication list given to you today.  Your physician recommends that you return for lab work BMP/MG  Thank you for choosing [facility]!!    

## 2017-08-29 ENCOUNTER — Encounter: Payer: Self-pay | Admitting: Cardiology

## 2017-09-04 ENCOUNTER — Encounter: Payer: Self-pay | Admitting: Internal Medicine

## 2017-09-04 ENCOUNTER — Ambulatory Visit (INDEPENDENT_AMBULATORY_CARE_PROVIDER_SITE_OTHER): Payer: Medicare Other | Admitting: Internal Medicine

## 2017-09-04 VITALS — BP 122/58 | HR 87 | Ht 64.0 in | Wt 338.0 lb

## 2017-09-04 DIAGNOSIS — I5022 Chronic systolic (congestive) heart failure: Secondary | ICD-10-CM | POA: Diagnosis not present

## 2017-09-04 DIAGNOSIS — I2510 Atherosclerotic heart disease of native coronary artery without angina pectoris: Secondary | ICD-10-CM

## 2017-09-04 DIAGNOSIS — I2583 Coronary atherosclerosis due to lipid rich plaque: Secondary | ICD-10-CM

## 2017-09-04 NOTE — Patient Instructions (Signed)
Medication Instructions:  Your physician recommends that you continue on your current medications as directed. Please refer to the Current Medication list given to you today.   Labwork: NONE   Testing/Procedures: NONE   Follow-Up: Your physician wants you to follow-up in: 9 Months with Dr. Ladona Ridgel. You will receive a reminder letter in the mail two months in advance. If you don't receive a letter, please call our office to schedule the follow-up appointment.  Remote monitoring is used to monitor your Pacemaker of ICD from home. This monitoring reduces the number of office visits required to check your device to one time per year. It allows [place] to keep an eye on the functioning of your device to ensure it is working properly. You are scheduled for a device check from home on 09/07/17. You may send your transmission at any time that day. If you have a wireless device, the transmission will be sent automatically. After your physician reviews your transmission, you will receive a postcard with your next transmission date.     Any Other Special Instructions Will Be Listed Below (If Applicable).     If you need a refill on your cardiac medications before your next appointment, please call your pharmacy.  Thank you for choosing [facility]!

## 2017-09-04 NOTE — Progress Notes (Signed)
HPI Donna Graves returns today for ICD follow-up.  She is a very pleasant 66 year old woman with a history of chronic systolic heart failure status post pacemaker insertion secondary to complete heart block.  The patient underwent attempted biventricular ICD upgrade several months ago and had no suitable venous anatomy.  Her procedure was complicated by additional bleeding which required [place] to oversew her cephalic vein.  Eventually she stabilized and had her device changed out upgrading her to a dual-chamber ICD.  She returns today for follow-up.  In the interim, she has been hospitalized twice for volume overload.  Unfortunately she continues to use sodium in excess.  For breakfast this morning she had [employer] chicken.  She denies missing her medications.  She does not appear to be weighing herself.  She was evaluated for bariatric surgery but thought to be a poor risk. Allergies  Allergen Reactions  . Codeine Rash     Current Outpatient Medications  Medication Sig Dispense Refill  . acetaminophen (TYLENOL) 650 MG CR tablet Take 650-1,300 mg by mouth every 8 (eight) hours as needed for pain.    Marland Kitchen acidophilus (RISAQUAD) CAPS capsule Take 1 capsule by mouth daily. ULTIMATE FLORA PROBIOTIC    . carvedilol (COREG) 12.5 MG tablet TAKE 12.5 mg  BY MOUTH TWICE DAILY  1  . Cholecalciferol (VITAMIN D PO) Take 7,000 Units by mouth daily.    Marland Kitchen dicyclomine (BENTYL) 10 MG capsule Take 10 mg by mouth 3 (three) times daily as needed for spasms.    . enalapril (VASOTEC) 10 MG tablet Take 10 mg by mouth 2 (two) times daily.     Marland Kitchen escitalopram (LEXAPRO) 5 MG tablet Take 1 tablet by mouth at bedtime.  2  . fluticasone (FLONASE) 50 MCG/ACT nasal spray Place 1-2 sprays into both nostrils daily as needed for allergies.  0  . glimepiride (AMARYL) 2 MG tablet Take 4 mg by mouth daily. BEFORE A MEAL    . magnesium oxide (MAG-OX) 400 MG tablet Take 400 mg by mouth daily.    . metolazone (ZAROXOLYN) 2.5 MG  tablet TAKE 1 TABLET BY MOUTH 2 TIMES A WEEK ON TUESDAY AND FRIDAY 25 tablet 3  . Misc Natural Products (BLACK CHERRY CONCENTRATE PO) Take 2 tablets by mouth daily.     . Multiple Vitamins-Minerals (ALIVE ONCE DAILY WOMENS 50+ PO) Take 1 tablet by mouth daily.    Marland Kitchen omeprazole (PRILOSEC) 20 MG capsule Take 20 mg by mouth every morning.    . polycarbophil (FIBERCON) 625 MG tablet Take 625 mg by mouth 4 (four) times daily.    . potassium chloride SA (K-DUR,KLOR-CON) 20 MEQ tablet TAKE 3 TABLETS BY MOUTH EVERY MORNING AND 2 TABLETS BY MOUTH IN THE EVENING(PM) 450 tablet 6  . Red Yeast Rice Extract (RED YEAST RICE PO) Take 2 tablets by mouth daily.     . simvastatin (ZOCOR) 40 MG tablet Take 40 mg by mouth every evening.     . torsemide (DEMADEX) 20 MG tablet Take 4 tablets (80 mg total) 2 (two) times daily by mouth. 240 tablet 11   No current facility-administered medications for this visit.      Past Medical History:  Diagnosis Date  . Atrial fibrillation and flutter (HCC)    History of RFA and ultimately AV node ablation  . Cardiomyopathy (HCC)    LVEF 25-30%  . Chronic systolic heart failure (HCC)   . Depression   . GERD (gastroesophageal reflux disease)   .  Gout   . History of cardiac catheterization 07/23/2013   RA 17; RV 43/20 PCWP 27.  LAD normal  LCx  norma  RCA normal.  LVEF 55%  . Hyperlipidemia   . Hypertension   . Hypothyroidism   . 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 Ladona Ridgel  . Type 2 diabetes mellitus (HCC)     ROS:   All systems reviewed and negative except as noted in the HPI.   Past Surgical History:  Procedure Laterality Date  . ANAL FISSURE REPAIR    . BACK SURGERY    . BIV UPGRADE N/A 05/23/2017   Procedure: BiVI Upgrade;  Surgeon: Marinus Maw, MD;  Location: [facility] INVASIVE CV LAB;  Service: Cardiovascular;  Laterality: N/A;  . ICD IMPLANT  05/23/2017   biv  . Left toe surgery         Family History  Problem Relation Age of Onset  . Diabetes Mother 76  . Heart attack Mother   . Hypertension Mother   . Diabetes Brother 27  . Arthritis Father 50     Social History   Socioeconomic History  . Marital status: Married    Spouse name: Not on file  . Number of children: Not on file  . Years of education: Not on file  . Highest education level: Not on file  Social Needs  . Financial resource strain: Not on file  . Food insecurity - worry: Not on file  . Food insecurity - inability: Not on file  . Transportation needs - medical: Not on file  . Transportation needs - non-medical: Not on file  Occupational History  . Not on file  Tobacco Use  . Smoking status: Never Smoker  . Smokeless tobacco: Never Used  Substance and Sexual Activity  . Alcohol use: No    Alcohol/week: 0.0 oz  . Drug use: No  . Sexual activity: Not Currently    Birth control/protection: None  Other Topics Concern  . Not on file  Social History Narrative  . Not on file     BP (!) 122/58   Pulse 87   Ht 5\' 4"  (1.626 m)   Wt (!) 338 lb (153.3 kg)   SpO2 96%   BMI 58.02 kg/m   Physical Exam:   obese appearing 66 year old woman, NAD HEENT: Unremarkable Neck: 6 cm  JVD, no thyromegally Lymphatics:  No adenopathy Back:  No CVA tenderness Lungs:  Clear, with minimal rales in the bases.  No increased work of breathing. HEART:  Regular rate rhythm, no murmurs, no rubs, no clicks Abd:  soft, positive bowel sounds, no organomegally, no rebound, no guarding Ext:  2 plus pulses, no edema, no cyanosis, no clubbing Skin:  No rashes no nodules Neuro:  CN II through XII intact, motor grossly intact  EKG -none today  DEVICE  Normal device function.  See PaceArt for details.   Assess/Plan: 1.  Chronic systolic heart failure -she continues to have class III symptoms but also has problems with dietary noncompliance.  I have strongly encouraged the patient in this regard.  She is  pending medical weight loss clinic evaluation. 2.  Complete heart block - she is asymptomatic status post device implantation. She has no escape rhythm. 3. Morbid obesity -she was thought to be a poor candidate for surgical weight loss and has been referred to a medical weight loss clinic.  I have encouraged the patient on the importance of  weight loss. 4.  Hypertension -her blood pressure is well controlled.  She will continue her current medical therapy.  I have strongly encouraged the patient to eat less salt.  Sharrell KuGreg Taylor, MD

## 2017-09-05 LAB — CUP PACEART INCLINIC DEVICE CHECK
Battery Remaining Longevity: 73 mo
Brady Statistic AS VP Percent: 16 %
Date Time Interrogation Session: 20181218113226
HighPow Impedance: 62 Ohm
Implantable Lead Implant Date: 20050121
Implantable Lead Implant Date: 20141223
Implantable Lead Implant Date: 20180904
Implantable Lead Model: 6935
Lead Channel Impedance Value: 513 Ohm
Lead Channel Pacing Threshold Pulse Width: 0.4 ms
Lead Channel Pacing Threshold Pulse Width: 0.6 ms
Lead Channel Setting Pacing Amplitude: 2.5 V
Lead Channel Setting Pacing Pulse Width: 0.4 ms
MDC IDC LEAD LOCATION: 753859
MDC IDC LEAD LOCATION: 753860
MDC IDC LEAD LOCATION: 753860
MDC IDC MSMT LEADCHNL RA PACING THRESHOLD AMPLITUDE: 1.25 V
MDC IDC MSMT LEADCHNL RA SENSING INTR AMPL: 0.6 mV
MDC IDC MSMT LEADCHNL RV IMPEDANCE VALUE: 399 Ohm
MDC IDC MSMT LEADCHNL RV PACING THRESHOLD AMPLITUDE: 1.25 V
MDC IDC PG IMPLANT DT: 20180904
MDC IDC SET LEADCHNL RV PACING AMPLITUDE: 3 V
MDC IDC SET LEADCHNL RV SENSING SENSITIVITY: 0.3 mV
MDC IDC STAT BRADY AP VP PERCENT: 83.2 %
MDC IDC STAT BRADY AP VS PERCENT: 0.3 %
MDC IDC STAT BRADY AS VS PERCENT: 0.5 %

## 2017-09-07 ENCOUNTER — Telehealth: Payer: Self-pay

## 2017-09-07 ENCOUNTER — Telehealth: Payer: Self-pay | Admitting: Cardiology

## 2017-09-07 MED ORDER — POTASSIUM CHLORIDE CRYS ER 20 MEQ PO TBCR: EXTENDED_RELEASE_TABLET | ORAL | 3 refills | Status: DC

## 2017-09-07 NOTE — Telephone Encounter (Signed)
-----   Message from Antoine Poche, MD sent at 09/06/2017 11:42 AM EST ----- Labs reviewed, K is a little on the low side. Verify she is taking KCl in AM and in Pm, if so increase to 60 bid   Dominga Ferry MD

## 2017-09-07 NOTE — Telephone Encounter (Signed)
Spoke with pt. She stated that she is taking KCL 60 meq in the AM and 40 meq in the PM. I advised her to take 60 meq bid. She voiced understanding.

## 2017-09-07 NOTE — Telephone Encounter (Signed)
LMOVM reminding pt to send remote transmission.   

## 2017-09-21 NOTE — Progress Notes (Signed)
No ICM remote transmission received for 09/07/2017 and next ICM transmission scheduled for 10/09/2017.

## 2017-10-04 ENCOUNTER — Ambulatory Visit: Payer: Medicare Other | Admitting: Cardiology

## 2017-10-09 ENCOUNTER — Ambulatory Visit (INDEPENDENT_AMBULATORY_CARE_PROVIDER_SITE_OTHER): Payer: Medicare Other

## 2017-10-09 DIAGNOSIS — I5022 Chronic systolic (congestive) heart failure: Secondary | ICD-10-CM

## 2017-10-09 DIAGNOSIS — Z95810 Presence of automatic (implantable) cardiac defibrillator: Secondary | ICD-10-CM

## 2017-10-09 NOTE — Progress Notes (Signed)
EPIC Encounter for ICM Monitoring  Patient Name: Donna Graves is a 67 y.o. female Date: 10/09/2017 Primary Care Physican: Wilson Singer, MD Primary Cardiologist: Branch Electrophysiologist: Ladona Ridgel Dry Weight:    339 lbs (weighs daily) Bi-V Pacing:   94.7%    Clinical Status (04-Sep-2017 to 09-Oct-2017) Treated VT/VF 0 episodes  AT/AF 26 episodes  Time in AT/AF 6.0 hr/day (25.0%)  Observations (2) (04-Sep-2017 to 09-Oct-2017)  AT/AF >= 6 hr for 9 days.  Patient Activity less than 1 hr/day for 5 weeks.          Heart Failure questions reviewed, pt symptomatic with stomach bloating and weight gain of 14 lbs since last ICM transmission on 08/24/17 (weight was 325 lbs).   Thoracic impedance abnormal suggesting fluid accumulation since 08/20/17.  Prescribed dosage: Torsemide 20 mg take 4 tablets (80 mg total) twice a day.  Metolazone 2.5 mg take 1 tablet 2 times a week on Tuesday & Friday. Potassium 20 mEq take 3 tablets twice a day.    Recommendations:  Patient confirmed she is taking Metolazone twice a week. She said she is reviewing salt labels and trying to limit salt intake. She thinks she may be drinking more than 64 oz of fluid daily and encouraged her to limit.  She has office appointment on 10/26/2017.    Follow-up plan: ICM clinic phone appointment on 10/24/2017 to recheck fluid levels prior to office appointment scheduled 10/26/2017 with Dr. Wyline Mood.  Copy of ICM check sent to Dr. Wyline Mood and Dr. Ladona Ridgel for review and recommendations if needed and will call back if any recommendations given.    3 month ICM trend: 10/09/2017    1 Year ICM trend:       Karie Soda, RN 10/09/2017 9:15 AM

## 2017-10-12 ENCOUNTER — Emergency Department (HOSPITAL_COMMUNITY): Payer: Medicare Other

## 2017-10-12 ENCOUNTER — Inpatient Hospital Stay (HOSPITAL_COMMUNITY)
Admission: EM | Admit: 2017-10-12 | Discharge: 2017-10-21 | DRG: 291 | Disposition: A | Discharge status: Home Health | Payer: Medicare Other | Attending: Internal Medicine | Admitting: Internal Medicine

## 2017-10-12 ENCOUNTER — Other Ambulatory Visit: Payer: Self-pay

## 2017-10-12 ENCOUNTER — Encounter (HOSPITAL_COMMUNITY): Payer: Self-pay | Admitting: Emergency Medicine

## 2017-10-12 DIAGNOSIS — J9601 Acute respiratory failure with hypoxia: Secondary | ICD-10-CM | POA: Diagnosis present

## 2017-10-12 DIAGNOSIS — Z79899 Other long term (current) drug therapy: Secondary | ICD-10-CM | POA: Diagnosis not present

## 2017-10-12 DIAGNOSIS — G4733 Obstructive sleep apnea (adult) (pediatric): Secondary | ICD-10-CM | POA: Diagnosis present

## 2017-10-12 DIAGNOSIS — K219 Gastro-esophageal reflux disease without esophagitis: Secondary | ICD-10-CM | POA: Diagnosis present

## 2017-10-12 DIAGNOSIS — R778 Other specified abnormalities of plasma proteins: Secondary | ICD-10-CM | POA: Diagnosis present

## 2017-10-12 DIAGNOSIS — I471 Supraventricular tachycardia: Secondary | ICD-10-CM

## 2017-10-12 DIAGNOSIS — E662 Morbid (severe) obesity with alveolar hypoventilation: Secondary | ICD-10-CM | POA: Diagnosis present

## 2017-10-12 DIAGNOSIS — E039 Hypothyroidism, unspecified: Secondary | ICD-10-CM | POA: Diagnosis present

## 2017-10-12 DIAGNOSIS — I4891 Unspecified atrial fibrillation: Secondary | ICD-10-CM | POA: Diagnosis present

## 2017-10-12 DIAGNOSIS — F329 Major depressive disorder, single episode, unspecified: Secondary | ICD-10-CM | POA: Diagnosis present

## 2017-10-12 DIAGNOSIS — E119 Type 2 diabetes mellitus without complications: Secondary | ICD-10-CM | POA: Diagnosis present

## 2017-10-12 DIAGNOSIS — E876 Hypokalemia: Secondary | ICD-10-CM | POA: Diagnosis not present

## 2017-10-12 DIAGNOSIS — I110 Hypertensive heart disease with heart failure: Secondary | ICD-10-CM | POA: Diagnosis present

## 2017-10-12 DIAGNOSIS — I509 Heart failure, unspecified: Secondary | ICD-10-CM | POA: Diagnosis not present

## 2017-10-12 DIAGNOSIS — Z7984 Long term (current) use of oral hypoglycemic drugs: Secondary | ICD-10-CM

## 2017-10-12 DIAGNOSIS — E118 Type 2 diabetes mellitus with unspecified complications: Secondary | ICD-10-CM | POA: Diagnosis not present

## 2017-10-12 DIAGNOSIS — I10 Essential (primary) hypertension: Secondary | ICD-10-CM | POA: Diagnosis not present

## 2017-10-12 DIAGNOSIS — E785 Hyperlipidemia, unspecified: Secondary | ICD-10-CM | POA: Diagnosis present

## 2017-10-12 DIAGNOSIS — T502X5A Adverse effect of carbonic-anhydrase inhibitors, benzothiadiazides and other diuretics, initial encounter: Secondary | ICD-10-CM | POA: Diagnosis not present

## 2017-10-12 DIAGNOSIS — R748 Abnormal levels of other serum enzymes: Secondary | ICD-10-CM | POA: Diagnosis not present

## 2017-10-12 DIAGNOSIS — E8770 Fluid overload, unspecified: Secondary | ICD-10-CM

## 2017-10-12 DIAGNOSIS — I4892 Unspecified atrial flutter: Secondary | ICD-10-CM | POA: Diagnosis present

## 2017-10-12 DIAGNOSIS — Z6841 Body Mass Index (BMI) 40.0 and over, adult: Secondary | ICD-10-CM | POA: Diagnosis not present

## 2017-10-12 DIAGNOSIS — Z885 Allergy status to narcotic agent status: Secondary | ICD-10-CM

## 2017-10-12 DIAGNOSIS — I428 Other cardiomyopathies: Secondary | ICD-10-CM | POA: Diagnosis present

## 2017-10-12 DIAGNOSIS — M109 Gout, unspecified: Secondary | ICD-10-CM | POA: Diagnosis present

## 2017-10-12 DIAGNOSIS — R0600 Dyspnea, unspecified: Secondary | ICD-10-CM | POA: Diagnosis not present

## 2017-10-12 DIAGNOSIS — I959 Hypotension, unspecified: Secondary | ICD-10-CM | POA: Diagnosis not present

## 2017-10-12 DIAGNOSIS — I5023 Acute on chronic systolic (congestive) heart failure: Secondary | ICD-10-CM | POA: Diagnosis present

## 2017-10-12 DIAGNOSIS — I2510 Atherosclerotic heart disease of native coronary artery without angina pectoris: Secondary | ICD-10-CM | POA: Diagnosis present

## 2017-10-12 DIAGNOSIS — Z95810 Presence of automatic (implantable) cardiac defibrillator: Secondary | ICD-10-CM

## 2017-10-12 DIAGNOSIS — R7989 Other specified abnormal findings of blood chemistry: Secondary | ICD-10-CM

## 2017-10-12 DIAGNOSIS — T464X5A Adverse effect of angiotensin-converting-enzyme inhibitors, initial encounter: Secondary | ICD-10-CM | POA: Diagnosis not present

## 2017-10-12 LAB — COMPREHENSIVE METABOLIC PANEL
ALT: 14 U/L (ref 14–54)
AST: 41 U/L (ref 15–41)
Albumin: 3.2 g/dL — ABNORMAL LOW (ref 3.5–5.0)
Alkaline Phosphatase: 105 U/L (ref 38–126)
Anion gap: 16 — ABNORMAL HIGH (ref 5–15)
BILIRUBIN TOTAL: 1.7 mg/dL — AB (ref 0.3–1.2)
BUN: 22 mg/dL — ABNORMAL HIGH (ref 6–20)
CHLORIDE: 93 mmol/L — AB (ref 101–111)
CO2: 25 mmol/L (ref 22–32)
CREATININE: 1.14 mg/dL — AB (ref 0.44–1.00)
Calcium: 9 mg/dL (ref 8.9–10.3)
GFR, EST AFRICAN AMERICAN: 57 mL/min — AB (ref >60)
GFR, EST NON AFRICAN AMERICAN: 49 mL/min — AB (ref >60)
Glucose, Bld: 260 mg/dL — ABNORMAL HIGH (ref 65–99)
Potassium: 3.6 mmol/L (ref 3.5–5.1)
Sodium: 134 mmol/L — ABNORMAL LOW (ref 135–145)
TOTAL PROTEIN: 6.6 g/dL (ref 6.5–8.1)

## 2017-10-12 LAB — CBC WITH DIFFERENTIAL/PLATELET
Basophils Absolute: 0 10*3/uL (ref 0.0–0.1)
Basophils Relative: 0 %
EOS PCT: 1 %
Eosinophils Absolute: 0.1 10*3/uL (ref 0.0–0.7)
HCT: 49.3 % — ABNORMAL HIGH (ref 36.0–46.0)
Hemoglobin: 15.4 g/dL — ABNORMAL HIGH (ref 12.0–15.0)
LYMPHS ABS: 1.4 10*3/uL (ref 0.7–4.0)
LYMPHS PCT: 24 %
MCH: 29.3 pg (ref 26.0–34.0)
MCHC: 31.2 g/dL (ref 30.0–36.0)
MCV: 93.9 fL (ref 78.0–100.0)
MONO ABS: 0.5 10*3/uL (ref 0.1–1.0)
Monocytes Relative: 8 %
Neutro Abs: 3.9 10*3/uL (ref 1.7–7.7)
Neutrophils Relative %: 67 %
PLATELETS: 174 10*3/uL (ref 150–400)
RBC: 5.25 MIL/uL — AB (ref 3.87–5.11)
RDW: 16.6 % — AB (ref 11.5–15.5)
WBC: 5.9 10*3/uL (ref 4.0–10.5)

## 2017-10-12 LAB — TROPONIN I: Troponin I: 0.04 ng/mL (ref <0.03)

## 2017-10-12 LAB — BRAIN NATRIURETIC PEPTIDE: B Natriuretic Peptide: 417 pg/mL — ABNORMAL HIGH (ref 0.0–100.0)

## 2017-10-12 LAB — GLUCOSE, CAPILLARY: Glucose-Capillary: 207 mg/dL — ABNORMAL HIGH (ref 65–99)

## 2017-10-12 MED ORDER — CITALOPRAM HYDROBROMIDE 20 MG PO TABS
20.0000 mg | ORAL_TABLET | Freq: Every day | ORAL | Status: DC
  Administered 2017-10-12 – 2017-10-21 (×10): 20 mg via ORAL
  Filled 2017-10-12 (×10): qty 1

## 2017-10-12 MED ORDER — DICYCLOMINE HCL 10 MG PO CAPS
10.0000 mg | ORAL_CAPSULE | Freq: Three times a day (TID) | ORAL | Status: DC | PRN
Start: 2017-10-12 — End: 2017-10-21

## 2017-10-12 MED ORDER — ENOXAPARIN SODIUM 40 MG/0.4ML ~~LOC~~ SOLN
40.0000 mg | SUBCUTANEOUS | Status: DC
  Administered 2017-10-12: 40 mg via SUBCUTANEOUS
  Filled 2017-10-12: qty 0.4

## 2017-10-12 MED ORDER — CARVEDILOL 12.5 MG PO TABS
12.5000 mg | ORAL_TABLET | Freq: Two times a day (BID) | ORAL | Status: DC
  Administered 2017-10-12 – 2017-10-21 (×18): 12.5 mg via ORAL
  Filled 2017-10-12 (×19): qty 1

## 2017-10-12 MED ORDER — ACETAMINOPHEN 325 MG PO TABS
650.0000 mg | ORAL_TABLET | ORAL | Status: DC | PRN
Start: 2017-10-12 — End: 2017-10-21
  Administered 2017-10-15 – 2017-10-19 (×8): 650 mg via ORAL
  Filled 2017-10-12 (×8): qty 2

## 2017-10-12 MED ORDER — SODIUM CHLORIDE 0.9 % IV SOLN
250.0000 mL | INTRAVENOUS | Status: DC | PRN
  Administered 2017-10-17: 250 mL via INTRAVENOUS

## 2017-10-12 MED ORDER — POTASSIUM CHLORIDE CRYS ER 20 MEQ PO TBCR
20.0000 meq | EXTENDED_RELEASE_TABLET | Freq: Two times a day (BID) | ORAL | Status: DC
  Administered 2017-10-12 – 2017-10-16 (×8): 20 meq via ORAL
  Filled 2017-10-12 (×8): qty 1

## 2017-10-12 MED ORDER — ONDANSETRON HCL 4 MG/2ML IJ SOLN
4.0000 mg | Freq: Four times a day (QID) | INTRAMUSCULAR | Status: DC | PRN
  Administered 2017-10-13 – 2017-10-18 (×5): 4 mg via INTRAVENOUS
  Filled 2017-10-12 (×5): qty 2

## 2017-10-12 MED ORDER — ENALAPRIL MALEATE 5 MG PO TABS
10.0000 mg | ORAL_TABLET | Freq: Two times a day (BID) | ORAL | Status: DC
  Administered 2017-10-12 – 2017-10-14 (×4): 10 mg via ORAL
  Filled 2017-10-12 (×4): qty 2

## 2017-10-12 MED ORDER — FUROSEMIDE 10 MG/ML IJ SOLN
60.0000 mg | Freq: Once | INTRAMUSCULAR | Status: AC
  Administered 2017-10-12: 60 mg via INTRAVENOUS
  Filled 2017-10-12: qty 6

## 2017-10-12 MED ORDER — FUROSEMIDE 10 MG/ML IJ SOLN
80.0000 mg | Freq: Two times a day (BID) | INTRAMUSCULAR | Status: DC
  Administered 2017-10-12 – 2017-10-16 (×8): 80 mg via INTRAVENOUS
  Filled 2017-10-12 (×8): qty 8

## 2017-10-12 MED ORDER — SODIUM CHLORIDE 0.9% FLUSH
3.0000 mL | INTRAVENOUS | Status: DC | PRN
  Administered 2017-10-13: 3 mL via INTRAVENOUS
  Filled 2017-10-12: qty 3

## 2017-10-12 MED ORDER — SIMVASTATIN 20 MG PO TABS
40.0000 mg | ORAL_TABLET | Freq: Every evening | ORAL | Status: DC
  Administered 2017-10-12 – 2017-10-20 (×9): 40 mg via ORAL
  Filled 2017-10-12 (×9): qty 2

## 2017-10-12 MED ORDER — THYROID 30 MG PO TABS
15.0000 mg | ORAL_TABLET | Freq: Every day | ORAL | Status: DC
  Administered 2017-10-13 – 2017-10-21 (×9): 15 mg via ORAL
  Filled 2017-10-12 (×11): qty 1

## 2017-10-12 MED ORDER — INSULIN ASPART 100 UNIT/ML ~~LOC~~ SOLN
0.0000 [IU] | Freq: Every day | SUBCUTANEOUS | Status: DC
  Administered 2017-10-12: 2 [IU] via SUBCUTANEOUS
  Administered 2017-10-16: 3 [IU] via SUBCUTANEOUS
  Administered 2017-10-20: 2 [IU] via SUBCUTANEOUS

## 2017-10-12 MED ORDER — PANTOPRAZOLE SODIUM 40 MG PO TBEC
40.0000 mg | DELAYED_RELEASE_TABLET | Freq: Every day | ORAL | Status: DC
  Administered 2017-10-12 – 2017-10-21 (×10): 40 mg via ORAL
  Filled 2017-10-12 (×10): qty 1

## 2017-10-12 MED ORDER — SODIUM CHLORIDE 0.9% FLUSH
3.0000 mL | Freq: Two times a day (BID) | INTRAVENOUS | Status: DC
  Administered 2017-10-12 – 2017-10-21 (×15): 3 mL via INTRAVENOUS

## 2017-10-12 MED ORDER — INSULIN ASPART 100 UNIT/ML ~~LOC~~ SOLN
0.0000 [IU] | Freq: Three times a day (TID) | SUBCUTANEOUS | Status: DC
  Administered 2017-10-13 – 2017-10-15 (×5): 2 [IU] via SUBCUTANEOUS
  Administered 2017-10-16: 3 [IU] via SUBCUTANEOUS
  Administered 2017-10-16 – 2017-10-17 (×2): 2 [IU] via SUBCUTANEOUS
  Administered 2017-10-17 – 2017-10-18 (×2): 3 [IU] via SUBCUTANEOUS
  Administered 2017-10-18 – 2017-10-19 (×2): 2 [IU] via SUBCUTANEOUS
  Administered 2017-10-19: 3 [IU] via SUBCUTANEOUS
  Administered 2017-10-19 – 2017-10-20 (×4): 2 [IU] via SUBCUTANEOUS
  Administered 2017-10-21: 3 [IU] via SUBCUTANEOUS
  Administered 2017-10-21: 2 [IU] via SUBCUTANEOUS

## 2017-10-12 NOTE — ED Notes (Signed)
Trop 0.04 notified EDP with no new orders

## 2017-10-12 NOTE — ED Triage Notes (Signed)
PT c/o worsening with SOB on exertion over the past 4 days with hx of CHF. PT states abdominal distention and increased lower extremity edema.

## 2017-10-12 NOTE — H&P (Signed)
History and Physical    Donna Graves:[id number] DOB: [birth year] DOA: 10/12/2017  PCP: Wilson Singer, MD  Patient coming from: Home  I have personally briefly reviewed patient's old medical records in [facility]  Chief Complaint: Shortness of breath  HPI: EUDELIA Graves is a 67 y.o. female with medical history significant of chronic systolic CHF, hypertension and diabetes, presented to the hospital with progressive shortness of breath.  She reports that she noted a significant worsening in her shortness of breath since the past 4 days.  She denies any chest pain.  She she has worsening dyspnea on exertion.  She also describes orthopnea and has had to raise the head of her bed.  She is also noticed worsening edema and increasing weight.  She reports compliance with her medications as well as her fluid/salt intake.  Denies any cough, fever, vomiting, diarrhea  ED Course: Chest x-ray indicated decompensated CHF.  EKG showed paced rhythm.  She was noted to be have decompensated CHF.  Vitals were otherwise stable.  Weight has increased approximately 25 pounds above her last discharge.  She been referred for admission.  Review of Systems: As per HPI otherwise 10 point review of systems negative.    Past Medical History:  Diagnosis Date  . Atrial fibrillation and flutter (HCC)    History of RFA and ultimately AV node ablation  . Cardiomyopathy (HCC)    LVEF 25-30%  . Chronic systolic heart failure (HCC)   . Depression   . GERD (gastroesophageal reflux disease)   . Gout   . History of cardiac catheterization 07/23/2013   RA 17; RV 43/20 PCWP 27.  LAD normal  LCx  norma  RCA normal.  LVEF 55%  . Hyperlipidemia   . Hypertension   . Hypothyroidism   . 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 Ladona Ridgel  . Type 2 diabetes mellitus (HCC)     Past Surgical History:  Procedure Laterality Date  . ANAL  FISSURE REPAIR    . BACK SURGERY    . BIV UPGRADE N/A 05/23/2017   Procedure: BiVI Upgrade;  Surgeon: Marinus Maw, MD;  Location: [facility] INVASIVE CV LAB;  Service: Cardiovascular;  Laterality: N/A;  . ICD IMPLANT  05/23/2017   biv  . Left toe surgery       reports that  has never smoked. she has never used smokeless tobacco. She reports that she does not drink alcohol or use drugs.  Allergies  Allergen Reactions  . Codeine Rash    Family History  Problem Relation Age of Onset  . Diabetes Mother 66  . Heart attack Mother   . Hypertension Mother   . Diabetes Brother 3  . Arthritis Father 41     Prior to Admission medications   Medication Sig Start Date End Date Taking? Authorizing Provider  acetaminophen (TYLENOL) 650 MG CR tablet Take 650-1,300 mg by mouth every 8 (eight) hours as needed for pain.    [provider]  acidophilus (RISAQUAD) CAPS capsule Take 1 capsule by mouth daily. ULTIMATE FLORA PROBIOTIC    [provider]  ARMOUR THYROID 15 MG tablet Take 1 tablet by mouth daily. 10/05/17   [provider]  carvedilol (COREG) 12.5 MG tablet TAKE 12.5 mg  BY MOUTH TWICE DAILY 03/01/17   [provider]  Cholecalciferol (VITAMIN D PO) Take 7,000 Units by mouth daily.    [provider]  dicyclomine (  BENTYL) 10 MG capsule Take 10 mg by mouth 3 (three) times daily as needed for spasms.    [provider]  enalapril (VASOTEC) 10 MG tablet Take 10 mg by mouth 2 (two) times daily.     [provider]  escitalopram (LEXAPRO) 5 MG tablet Take 1 tablet by mouth at bedtime. 07/24/17   [provider]  fluticasone (FLONASE) 50 MCG/ACT nasal spray Place 1-2 sprays into both nostrils daily as needed for allergies. 07/17/17   [provider]  glimepiride (AMARYL) 2 MG tablet Take 4 mg by mouth daily. BEFORE A MEAL    [provider]  magnesium oxide (MAG-OX) 400 MG tablet Take 400 mg by mouth daily.     [provider]  metolazone (ZAROXOLYN) 2.5 MG tablet TAKE 1 TABLET BY MOUTH 2 TIMES A WEEK ON TUESDAY AND FRIDAY 07/31/17   Jodelle Gross, NP  Misc Natural Products (BLACK CHERRY CONCENTRATE PO) Take 2 tablets by mouth daily.     [provider]  Multiple Vitamins-Minerals (ALIVE ONCE DAILY WOMENS 50+ PO) Take 1 tablet by mouth daily.    [provider]  omeprazole (PRILOSEC) 20 MG capsule Take 20 mg by mouth every morning.    [provider]  polycarbophil (FIBERCON) 625 MG tablet Take 625 mg by mouth 4 (four) times daily.    [provider]  potassium chloride SA (K-DUR,KLOR-CON) 20 MEQ tablet TAKE 3 TABLETS BY MOUTH EVERY MORNING AND 3 TABLETS BY MOUTH IN THE EVENING(PM) 09/07/17   Branch, Dorothe Pea, MD  Red Yeast Rice Extract (RED YEAST RICE PO) Take 2 tablets by mouth daily.     [provider]  simvastatin (ZOCOR) 40 MG tablet Take 40 mg by mouth every evening.     [provider]  torsemide (DEMADEX) 20 MG tablet Take 4 tablets (80 mg total) 2 (two) times daily by mouth. 07/24/17 10/22/17  Antoine Poche, MD    Physical Exam: Vitals:   10/12/17 1200 10/12/17 1230 10/12/17 1300 10/12/17 1405  BP: 113/60 101/63 116/64 126/63  Pulse: 70 74 71 65  Resp: (!) 21 16 17 18   Temp:    98.2 F (36.8 C)  TempSrc:    Oral  SpO2: 92% 91% 91% 97%  Weight:    (!) 156.9 kg (345 lb 14.4 oz)  Height:    5\' 4"  (1.626 m)    Constitutional: NAD, calm, comfortable Vitals:   10/12/17 1200 10/12/17 1230 10/12/17 1300 10/12/17 1405  BP: 113/60 101/63 116/64 126/63  Pulse: 70 74 71 65  Resp: (!) 21 16 17 18   Temp:    98.2 F (36.8 C)  TempSrc:    Oral  SpO2: 92% 91% 91% 97%  Weight:    (!) 156.9 kg (345 lb 14.4 oz)  Height:    5\' 4"  (1.626 m)   Eyes: PERRL, lids and conjunctivae normal ENMT: Mucous membranes are moist. Posterior pharynx clear of any exudate or lesions.Normal dentition.  Neck: normal, supple, no masses, no  thyromegaly Respiratory: Crackles at bases. normal respiratory effort. No accessory muscle use.  Cardiovascular: Regular rate and rhythm, no murmurs / rubs / gallops.  1+. 2+ pedal pulses. No carotid bruits.  Abdomen: no tenderness, no masses palpated. No hepatosplenomegaly. Bowel sounds positive.  Abdomen is distended.  Abdominal wall pitting edema present Musculoskeletal: no clubbing / cyanosis. No joint deformity upper and lower extremities. Good ROM, no contractures. Normal muscle tone.  Skin: no rashes, lesions, ulcers. No  induration Neurologic: CN 2-12 grossly intact. Sensation intact, DTR normal. Strength 5/5 in all 4.  Psychiatric: Normal judgment and insight. Alert and oriented x 3. Normal mood.   Labs on Admission: I have personally reviewed following labs and imaging studies  CBC: Recent Labs  Lab 10/12/17 1118  WBC 5.9  NEUTROABS 3.9  HGB 15.4*  HCT 49.3*  MCV 93.9  PLT 174   Basic Metabolic Panel: Recent Labs  Lab 10/12/17 1118  NA 134*  K 3.6  CL 93*  CO2 25  GLUCOSE 260*  BUN 22*  CREATININE 1.14*  CALCIUM 9.0   GFR: Estimated Creatinine Clearance: 74.3 mL/min (A) (by C-G formula based on SCr of 1.14 mg/dL (H)). Liver Function Tests: Recent Labs  Lab 10/12/17 1118  AST 41  ALT 14  ALKPHOS 105  BILITOT 1.7*  PROT 6.6  ALBUMIN 3.2*   No results for input(s): LIPASE, AMYLASE in the last 168 hours. No results for input(s): AMMONIA in the last 168 hours. Coagulation Profile: No results for input(s): INR, PROTIME in the last 168 hours. Cardiac Enzymes: Recent Labs  Lab 10/12/17 1118  TROPONINI 0.04*   BNP (last 3 results) No results for input(s): PROBNP in the last 8760 hours. HbA1C: No results for input(s): HGBA1C in the last 72 hours. CBG: No results for input(s): GLUCAP in the last 168 hours. Lipid Profile: No results for input(s): CHOL, HDL, LDLCALC, TRIG, CHOLHDL, LDLDIRECT in the last 72 hours. Thyroid Function Tests: No results for  input(s): TSH, T4TOTAL, FREET4, T3FREE, THYROIDAB in the last 72 hours. Anemia Panel: No results for input(s): VITAMINB12, FOLATE, FERRITIN, TIBC, IRON, RETICCTPCT in the last 72 hours. Urine analysis:    Component Value Date/Time   COLORURINE STRAW (A) 08/17/2017 0829   APPEARANCEUR CLEAR 08/17/2017 0829   LABSPEC 1.006 08/17/2017 0829   PHURINE 7.0 08/17/2017 0829   GLUCOSEU NEGATIVE 08/17/2017 0829   HGBUR NEGATIVE 08/17/2017 0829   BILIRUBINUR NEGATIVE 08/17/2017 0829   KETONESUR NEGATIVE 08/17/2017 0829   PROTEINUR NEGATIVE 08/17/2017 0829   NITRITE NEGATIVE 08/17/2017 0829   LEUKOCYTESUR NEGATIVE 08/17/2017 0829    Radiological Exams on Admission: Dg Chest 2 View  Result Date: 10/12/2017 CLINICAL DATA:  Shortness of breath since October 08, 2017. History of CHF, atrial fibrillation, coronary artery disease, nonsmoker. EXAM: CHEST  2 VIEW COMPARISON:  Chest x-ray of August 17, 2017 FINDINGS: The lungs are adequately inflated. The right hemidiaphragm remains higher than the left. The interstitial markings are coarse but fairly stable. The cardiac silhouette is enlarged. The pulmonary vascularity is mildly engorged with mild cephalization noted. The ICD is in stable position. The mediastinum is normal in width. There is no significant pleural effusion. The bony thorax exhibits no acute abnormality. IMPRESSION: CHF with mild interstitial edema. There has not been significant interval change since the previous study. Electronically Signed   By: David  [place] M.D.   On: 10/12/2017 11:03    EKG: Independently reviewed. Paced rhythm  Assessment/Plan Principal Problem:   Acute on chronic systolic CHF (congestive heart failure) (HCC) Active Problems:   Type II diabetes mellitus (HCC)   Essential hypertension   Elevated troponin   Morbid obesity (HCC)   1. Acute on chronic systolic congestive heart failure.  Ejection fraction of 25-30%.  Started on intravenous Lasix.  Weight is  approximately 25 pounds since her last discharge.  Monitor intake and output and daily weights.  Continue beta-blockers and ACE inhibitors. 2. Diabetes.  Hold oral glimepiride.  Start on  sliding scale insulin. 3. Hypertension.  Continue on outpatient regimen of Coreg and lisinopril. 4. Elevated troponin.  Likely related to decompensated CHF.  Appears to be at her baseline level.  No complaints of chest pain. 5. Morbid obesity.  Patient is planning to follow-up at [facility] to be evaluated for bariatric surgery.  DVT prophylaxis: lovenox Code Status: full code Family Communication: discussed with husband Disposition Plan: discharge home when improved Consults called:  Admission status: inpatient telemetry   Erick Blinks MD [facility] Pager [phone]  If 7PM-7AM, please contact night-coverage [email or web address] Password [email or web address]  10/12/2017, 5:40 PM  '

## 2017-10-12 NOTE — ED Provider Notes (Signed)
[facility] EMERGENCY DEPARTMENT Provider Note   CSN: [id number] Arrival date & time: 10/12/17  1016     History   Chief Complaint Chief Complaint  Patient presents with  . Shortness of Breath    HPI Donna Graves is a 67 y.o. female.  HPI Patient presents with 4 days of increased shortness of breath.  States she has dyspnea with minor exertion and lying flat.  Has had increased abdominal swelling and bilateral lower extremity edema.  She denies cough, fever or chills.  No chest pain.  Recently admitted for CHF exacerbation in December.  Since that time she has had a 30 pound weight gain.  No recent changes to her medication.  States she is been compliant.  Past Medical History:  Diagnosis Date  . Atrial fibrillation and flutter (HCC)    History of RFA and ultimately AV node ablation  . Cardiomyopathy (HCC)    LVEF 25-30%  . Chronic systolic heart failure (HCC)   . Depression   . GERD (gastroesophageal reflux disease)   . Gout   . History of cardiac catheterization 07/23/2013   RA 17; RV 43/20 PCWP 27.  LAD normal  LCx  norma  RCA normal.  LVEF 55%  . Hyperlipidemia   . Hypertension   . Hypothyroidism   . 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 Ladona Ridgel  . Type 2 diabetes mellitus [facility])     Patient Active Problem List   Diagnosis Date Noted  . CHF exacerbation (HCC) 10/12/2017  . Acute on chronic congestive heart failure (HCC) 08/17/2017  . Chronic systolic heart failure (HCC) 05/23/2017  . Atypical chest pain   . Elevated troponin   . Acute on chronic systolic CHF (congestive heart failure) (HCC) 03/30/2017  . Near syncope 03/30/2017  . Type II diabetes mellitus (HCC) 03/30/2017  . AF (paroxysmal atrial fibrillation) (HCC) 03/30/2017  . Essential hypertension 03/30/2017  . CAD (coronary artery disease) 03/30/2017    Past Surgical History:  Procedure Laterality Date  . ANAL FISSURE  REPAIR    . BACK SURGERY    . BIV UPGRADE N/A 05/23/2017   Procedure: BiVI Upgrade;  Surgeon: Marinus Maw, MD;  Location: [facility] INVASIVE CV LAB;  Service: Cardiovascular;  Laterality: N/A;  . ICD IMPLANT  05/23/2017   biv  . Left toe surgery      OB History    Gravida Para Term Preterm AB Living             1   SAB TAB Ectopic Multiple Live Births                   Home Medications    Prior to Admission medications   Medication Sig Start Date End Date Taking? Authorizing Provider  acetaminophen (TYLENOL) 650 MG CR tablet Take 650-1,300 mg by mouth every 8 (eight) hours as needed for pain.    [provider]  acidophilus (RISAQUAD) CAPS capsule Take 1 capsule by mouth daily. ULTIMATE FLORA PROBIOTIC    [provider]  ARMOUR THYROID 15 MG tablet Take 1 tablet by mouth daily. 10/05/17   [provider]  carvedilol (COREG) 12.5 MG tablet TAKE 12.5 mg  BY MOUTH TWICE DAILY 03/01/17   [provider]  Cholecalciferol (VITAMIN D PO) Take 7,000 Units by mouth daily.    [provider]  dicyclomine (BENTYL) 10 MG capsule Take 10 mg by mouth 3 (three) times daily  as needed for spasms.    [provider]  enalapril (VASOTEC) 10 MG tablet Take 10 mg by mouth 2 (two) times daily.     [provider]  escitalopram (LEXAPRO) 5 MG tablet Take 1 tablet by mouth at bedtime. 07/24/17   [provider]  fluticasone (FLONASE) 50 MCG/ACT nasal spray Place 1-2 sprays into both nostrils daily as needed for allergies. 07/17/17   [provider]  glimepiride (AMARYL) 2 MG tablet Take 4 mg by mouth daily. BEFORE A MEAL    [provider]  magnesium oxide (MAG-OX) 400 MG tablet Take 400 mg by mouth daily.    [provider]  metolazone (ZAROXOLYN) 2.5 MG tablet TAKE 1 TABLET BY MOUTH 2 TIMES A WEEK ON TUESDAY AND FRIDAY 07/31/17   Jodelle Gross, NP  Misc Natural Products (BLACK CHERRY CONCENTRATE PO) Take 2  tablets by mouth daily.     [provider]  Multiple Vitamins-Minerals (ALIVE ONCE DAILY WOMENS 50+ PO) Take 1 tablet by mouth daily.    [provider]  omeprazole (PRILOSEC) 20 MG capsule Take 20 mg by mouth every morning.    [provider]  polycarbophil (FIBERCON) 625 MG tablet Take 625 mg by mouth 4 (four) times daily.    [provider]  potassium chloride SA (K-DUR,KLOR-CON) 20 MEQ tablet TAKE 3 TABLETS BY MOUTH EVERY MORNING AND 3 TABLETS BY MOUTH IN THE EVENING(PM) 09/07/17   Branch, Dorothe Pea, MD  Red Yeast Rice Extract (RED YEAST RICE PO) Take 2 tablets by mouth daily.     [provider]  simvastatin (ZOCOR) 40 MG tablet Take 40 mg by mouth every evening.     [provider]  torsemide (DEMADEX) 20 MG tablet Take 4 tablets (80 mg total) 2 (two) times daily by mouth. 07/24/17 10/22/17  Antoine Poche, MD    Family History Family History  Problem Relation Age of Onset  . Diabetes Mother 23  . Heart attack Mother   . Hypertension Mother   . Diabetes Brother 23  . Arthritis Father 21    Social History Social History   Tobacco Use  . Smoking status: Never Smoker  . Smokeless tobacco: Never Used  Substance Use Topics  . Alcohol use: No    Alcohol/week: 0.0 oz  . Drug use: No     Allergies   Codeine   Review of Systems Review of Systems  Constitutional: Positive for fatigue. Negative for chills and fever.  Respiratory: Positive for shortness of breath. Negative for cough.   Cardiovascular: Positive for leg swelling. Negative for chest pain and palpitations.  Gastrointestinal: Positive for abdominal distention. Negative for abdominal pain, constipation, diarrhea, nausea and vomiting.  Genitourinary: Negative for dysuria, flank pain and frequency.  Musculoskeletal: Negative for back pain and neck pain.  Skin: Negative for rash and wound.  Neurological: Negative for dizziness, weakness, light-headedness,  numbness and headaches.  All other systems reviewed and are negative.    Physical Exam Updated Vital Signs BP 116/64   Pulse 71   Temp 98.2 F (36.8 C) (Oral)   Resp 17   Ht 5\' 4"  (1.626 m)   Wt (!) 157.4 kg (347 lb)   SpO2 91%   BMI 59.56 kg/m   Physical Exam  Constitutional: She is oriented to person, place, and time. She appears well-developed and well-nourished. No distress.  HENT:  Head: Normocephalic and atraumatic.  Mouth/Throat: Oropharynx is clear and moist. No oropharyngeal exudate.  Eyes: EOM are normal. Pupils are equal, round, and reactive to light.  Neck: Normal range of motion. Neck supple.  Cardiovascular: Normal rate and regular rhythm. Exam reveals no gallop and no friction rub.  No murmur heard. Pulmonary/Chest: Effort normal. She has rales.  Diminished breath sounds bilateral bases with few scattered crackles.  Abdominal: Soft. Bowel sounds are normal. She exhibits distension. There is no tenderness. There is no rebound and no guarding.  Distended abdomen.  Anasarca  Musculoskeletal: Normal range of motion. She exhibits edema. She exhibits no tenderness.  2+ bilateral lower extremity pitting edema.  No calf swelling or asymmetry.  Neurological: She is alert and oriented to person, place, and time.  Skin: Skin is warm and dry. No rash noted. She is not diaphoretic. No erythema.  Psychiatric: She has a normal mood and affect. Her behavior is normal.  Nursing note and vitals reviewed.    ED Treatments / Results  Labs (all labs ordered are listed, but only abnormal results are displayed) Labs Reviewed  TROPONIN I - Abnormal; Notable for the following components:      Result Value   Troponin I 0.04 (*)    All other components within normal limits  CBC WITH DIFFERENTIAL/PLATELET - Abnormal; Notable for the following components:   RBC 5.25 (*)    Hemoglobin 15.4 (*)    HCT 49.3 (*)    RDW 16.6 (*)    All other components within normal limits    COMPREHENSIVE METABOLIC PANEL - Abnormal; Notable for the following components:   Sodium 134 (*)    Chloride 93 (*)    Glucose, Bld 260 (*)    BUN 22 (*)    Creatinine, Ser 1.14 (*)    Albumin 3.2 (*)    Total Bilirubin 1.7 (*)    GFR calc non Af Amer 49 (*)    GFR calc Af Amer 57 (*)    Anion gap 16 (*)    All other components within normal limits  BRAIN NATRIURETIC PEPTIDE    EKG  EKG Interpretation  Date/Time:  Thursday October 12 2017 10:34:56 EST Ventricular Rate:  72 PR Interval:    QRS Duration: 162 QT Interval:  486 QTC Calculation: 532 R Axis:   -14 Text Interpretation:  Ventricular-paced rhythm Abnormal ECG Confirmed by Loren Racer ([id number]) on 10/12/2017 11:59:13 AM       Radiology Dg Chest 2 View  Result Date: 10/12/2017 CLINICAL DATA:  Shortness of breath since October 08, 2017. History of CHF, atrial fibrillation, coronary artery disease, nonsmoker. EXAM: CHEST  2 VIEW COMPARISON:  Chest x-ray of August 17, 2017 FINDINGS: The lungs are adequately inflated. The right hemidiaphragm remains higher than the left. The interstitial markings are coarse but fairly stable. The cardiac silhouette is enlarged. The pulmonary vascularity is mildly engorged with mild cephalization noted. The ICD is in stable position. The mediastinum is normal in width. There is no significant pleural effusion. The bony thorax exhibits no acute abnormality. IMPRESSION: CHF with mild interstitial edema. There has not been significant interval change since the previous study. Electronically Signed   By: Tolbert Matheson  [place] M.D.   On: 10/12/2017 11:03    Procedures Procedures (including critical care time)  Medications Ordered in ED Medications  furosemide (LASIX) injection 60 mg (60 mg Intravenous Given 10/12/17 1213)     Initial Impression / Assessment and Plan / ED Course  I have reviewed the triage vital signs and the nursing notes.  Pertinent labs & imaging  results that were available  during my care of the patient were reviewed by me and considered in my medical decision making (see chart for details).    Blood pressure is borderline.  We will give low-dose IV Lasix to slowly diurese.  Discussed with hospitalist who will see patient in the emergency department and admit.   Final Clinical Impressions(s) / ED Diagnoses   Final diagnoses:  Hypervolemia, unspecified hypervolemia type    ED Discharge Orders    None       Loren Racer, MD 10/12/17 1326

## 2017-10-13 LAB — BASIC METABOLIC PANEL
ANION GAP: 16 — AB (ref 5–15)
BUN: 22 mg/dL — ABNORMAL HIGH (ref 6–20)
CALCIUM: 9 mg/dL (ref 8.9–10.3)
CO2: 29 mmol/L (ref 22–32)
CREATININE: 1.08 mg/dL — AB (ref 0.44–1.00)
Chloride: 91 mmol/L — ABNORMAL LOW (ref 101–111)
GFR calc Af Amer: >60 mL/min (ref >60)
GFR calc non Af Amer: 53 mL/min — ABNORMAL LOW (ref >60)
GLUCOSE: 107 mg/dL — AB (ref 65–99)
Potassium: 3.4 mmol/L — ABNORMAL LOW (ref 3.5–5.1)
Sodium: 136 mmol/L (ref 135–145)

## 2017-10-13 LAB — TROPONIN I: Troponin I: 0.05 ng/mL (ref <0.03)

## 2017-10-13 LAB — GLUCOSE, CAPILLARY
GLUCOSE-CAPILLARY: 136 mg/dL — AB (ref 65–99)
GLUCOSE-CAPILLARY: 96 mg/dL (ref 65–99)
GLUCOSE-CAPILLARY: 163 mg/dL — AB (ref 65–99)
Glucose-Capillary: 115 mg/dL — ABNORMAL HIGH (ref 65–99)

## 2017-10-13 MED ORDER — METOLAZONE 5 MG PO TABS
5.0000 mg | ORAL_TABLET | Freq: Two times a day (BID) | ORAL | Status: DC
  Administered 2017-10-13 – 2017-10-16 (×6): 5 mg via ORAL
  Filled 2017-10-13 (×6): qty 1

## 2017-10-13 MED ORDER — PROMETHAZINE HCL 25 MG/ML IJ SOLN
12.5000 mg | Freq: Four times a day (QID) | INTRAMUSCULAR | Status: DC | PRN
  Administered 2017-10-13: 12.5 mg via INTRAVENOUS
  Filled 2017-10-13: qty 1

## 2017-10-13 MED ORDER — GI COCKTAIL ~~LOC~~
30.0000 mL | Freq: Three times a day (TID) | ORAL | Status: DC | PRN
  Administered 2017-10-13 – 2017-10-15 (×4): 30 mL via ORAL
  Filled 2017-10-13 (×4): qty 30

## 2017-10-13 MED ORDER — ENOXAPARIN SODIUM 80 MG/0.8ML ~~LOC~~ SOLN
80.0000 mg | SUBCUTANEOUS | Status: DC
  Administered 2017-10-13 – 2017-10-18 (×6): 80 mg via SUBCUTANEOUS
  Filled 2017-10-13 (×6): qty 0.8

## 2017-10-13 MED ORDER — GI COCKTAIL ~~LOC~~
30.0000 mL | Freq: Once | ORAL | Status: AC
  Administered 2017-10-13: 30 mL via ORAL
  Filled 2017-10-13: qty 30

## 2017-10-13 NOTE — Progress Notes (Signed)
PROGRESS NOTE    Donna Graves  ZOX:[id number] DOB: October 30, 1950 DOA: 10/12/2017 PCP: Wilson Singer, MD    Brief Narrative:  67 year old female with a history of systolic heart failure, EF of 81-19%, admitted to the hospital with progressive weight gain, peripheral edema and shortness of breath. Found to have decompensated CHF.  Admitted for IV diuresis.   Assessment & Plan:   Principal Problem:   Acute on chronic systolic CHF (congestive heart failure) (HCC) Active Problems:   Type II diabetes mellitus (HCC)   Essential hypertension   Elevated troponin   Morbid obesity (HCC)   1. Acute on chronic systolic congestive heart failure.  Ejection fraction of 25-30%.  Started on intravenous Lasix.    No significant change in weight since admission.  Monitor intake and output and daily weights. Urine output has been unimpressive.  Continue on Lasix and add metolazone.  Start on fluid restriction.  Continue beta-blockers and ACE inhibitors. 2. Diabetes.  Hold oral glimepiride.  Start on sliding scale insulin. 3. Hypertension.  Continue on outpatient regimen of Coreg and lisinopril. 4. Elevated troponin.  Likely related to decompensated CHF.  Appears to be at her baseline level.  No complaints of chest pain. 5. Morbid obesity.  Patient is planning to follow-up at [facility] to be evaluated for bariatric surgery.   DVT prophylaxis: Lovenox Code Status: Full code Family Communication: No family present Disposition Plan: Discharge home once improved   Consultants:     Procedures:     Antimicrobials:      Subjective: Feels nauseous today.  Also feeling "out of it".  Still short of breath today.  Objective: Vitals:   10/13/17 0300 10/13/17 0858 10/13/17 1215 10/13/17 1748  BP: (!) 104/46 120/65 (!) 107/39 (!) 117/94  Pulse: 73 72 70 69  Resp: 20   20  Temp: 98.4 F (36.9 C)   98.5 F (36.9 C)  TempSrc: Oral   Axillary  SpO2: 94%  94% 98%  Weight: (!) 157.9 kg  (348 lb 1.6 oz)     Height:        Intake/Output Summary (Last 24 hours) at 10/13/2017 1817 Last data filed at 10/13/2017 1700 Gross per 24 hour  Intake 480 ml  Output 2150 ml  Net -1670 ml   Filed Weights   10/12/17 1030 10/12/17 1405 10/13/17 0300  Weight: (!) 157.4 kg (347 lb) (!) 156.9 kg (345 lb 14.4 oz) (!) 157.9 kg (348 lb 1.6 oz)    Examination:  General exam: Appears calm and comfortable  Respiratory system: Crackles at bases. Respiratory effort normal. Cardiovascular system: S1 & S2 heard, RRR. No JVD, murmurs, rubs, gallops or clicks. 1+ pedal edema. Gastrointestinal system: Abdomen is distended with abdominal wall pitting edema, soft and nontender. No organomegaly or masses felt. Normal bowel sounds heard. Central nervous system: Alert and oriented. No focal neurological deficits. Extremities: Symmetric 5 x 5 power. Skin: No rashes, lesions or ulcers Psychiatry: Judgement and insight appear normal. Mood & affect appropriate.     Data Reviewed: I have personally reviewed following labs and imaging studies  CBC: Recent Labs  Lab 10/12/17 1118  WBC 5.9  NEUTROABS 3.9  HGB 15.4*  HCT 49.3*  MCV 93.9  PLT 174   Basic Metabolic Panel: Recent Labs  Lab 10/12/17 1118 10/13/17 0529  NA 134* 136  K 3.6 3.4*  CL 93* 91*  CO2 25 29  GLUCOSE 260* 107*  BUN 22* 22*  CREATININE 1.14* 1.08*  CALCIUM  9.0 9.0   GFR: Estimated Creatinine Clearance: 78.7 mL/min (A) (by C-G formula based on SCr of 1.08 mg/dL (H)). Liver Function Tests: Recent Labs  Lab 10/12/17 1118  AST 41  ALT 14  ALKPHOS 105  BILITOT 1.7*  PROT 6.6  ALBUMIN 3.2*   No results for input(s): LIPASE, AMYLASE in the last 168 hours. No results for input(s): AMMONIA in the last 168 hours. Coagulation Profile: No results for input(s): INR, PROTIME in the last 168 hours. Cardiac Enzymes: Recent Labs  Lab 10/12/17 1118 10/13/17 1304  TROPONINI 0.04* 0.05*   BNP (last 3 results) No  results for input(s): PROBNP in the last 8760 hours. HbA1C: No results for input(s): HGBA1C in the last 72 hours. CBG: Recent Labs  Lab 10/12/17 2113 10/13/17 0803 10/13/17 1206 10/13/17 1636  GLUCAP 207* 115* 136* 96   Lipid Profile: No results for input(s): CHOL, HDL, LDLCALC, TRIG, CHOLHDL, LDLDIRECT in the last 72 hours. Thyroid Function Tests: No results for input(s): TSH, T4TOTAL, FREET4, T3FREE, THYROIDAB in the last 72 hours. Anemia Panel: No results for input(s): VITAMINB12, FOLATE, FERRITIN, TIBC, IRON, RETICCTPCT in the last 72 hours. Sepsis Labs: No results for input(s): PROCALCITON, LATICACIDVEN in the last 168 hours.  No results found for this or any previous visit (from the past 240 hour(s)).       Radiology Studies: Dg Chest 2 View  Result Date: 10/12/2017 CLINICAL DATA:  Shortness of breath since October 08, 2017. History of CHF, atrial fibrillation, coronary artery disease, nonsmoker. EXAM: CHEST  2 VIEW COMPARISON:  Chest x-ray of August 17, 2017 FINDINGS: The lungs are adequately inflated. The right hemidiaphragm remains higher than the left. The interstitial markings are coarse but fairly stable. The cardiac silhouette is enlarged. The pulmonary vascularity is mildly engorged with mild cephalization noted. The ICD is in stable position. The mediastinum is normal in width. There is no significant pleural effusion. The bony thorax exhibits no acute abnormality. IMPRESSION: CHF with mild interstitial edema. There has not been significant interval change since the previous study. Electronically Signed   By: David  [place] M.D.   On: 10/12/2017 11:03        Scheduled Meds: . carvedilol  12.5 mg Oral BID WC  . citalopram  20 mg Oral Daily  . enalapril  10 mg Oral BID  . enoxaparin (LOVENOX) injection  80 mg Subcutaneous Q24H  . furosemide  80 mg Intravenous BID  . insulin aspart  0-15 Units Subcutaneous TID WC  . insulin aspart  0-5 Units Subcutaneous QHS    . metolazone  5 mg Oral BID  . pantoprazole  40 mg Oral Daily  . potassium chloride  20 mEq Oral BID  . simvastatin  40 mg Oral QPM  . sodium chloride flush  3 mL Intravenous Q12H  . thyroid  15 mg Oral Daily   Continuous Infusions: . sodium chloride       LOS: 1 day    Time spent:    Erick Blinks, MD [facility] Pager [phone]  If 7PM-7AM, please contact night-coverage [email or web address] Password [facility] 10/13/2017, 6:17 PM

## 2017-10-13 NOTE — Care Management Note (Addendum)
Case Management Note  Patient Details  Name: SCHERRIE BAECHLE MRN: [id number] Date of Birth: October 16, 1950  Subjective/Objective:    Admitted with CHF. Pt from home, lives with husband, ind with ADL's.  EF 25-30%.  Has PCP, transportation, reports no issues affording medications.  Has scales and reports compliance with diet.                  Action/Plan:DC hom. Will follow.   Expected Discharge Date:    10/16/2017              Expected Discharge Plan:  Home/Self Care  In-House Referral:     Discharge planning Services  CM Consult  Post Acute Care Choice:  NA Choice offered to:  NA  DME Arranged:    DME Agency:     HH Arranged:    HH Agency:     Status of Service:  In process, will continue to follow  If discussed at Long Length of Stay Meetings, dates discussed:    Additional Comments:  Aireonna Bauer, [name], RN 10/13/2017, 11:15 AM

## 2017-10-13 NOTE — Progress Notes (Signed)
In and out of cath completed. 50 cc's of yellow, clear urine returned. Dr. Kerry Hough made aware.

## 2017-10-13 NOTE — Progress Notes (Signed)
Pt c/o nausea despite IV Zofran and IV Phenergan. Pt does not admit to emesis, none witnessed. Dr. Kerry Hough paged and made aware.

## 2017-10-14 LAB — BLOOD GAS, ARTERIAL
ACID-BASE EXCESS: 6.6 mmol/L — AB (ref 0.0–2.0)
BICARBONATE: 30.4 mmol/L — AB (ref 20.0–28.0)
Drawn by: 221791
FIO2: 21
O2 Saturation: 93.3 %
PCO2 ART: 38.8 mmHg (ref 32.0–48.0)
PH ART: 7.5 — AB (ref 7.350–7.450)
pO2, Arterial: 70 mmHg — ABNORMAL LOW (ref 83.0–108.0)

## 2017-10-14 LAB — BASIC METABOLIC PANEL
Anion gap: 17 — ABNORMAL HIGH (ref 5–15)
BUN: 27 mg/dL — AB (ref 6–20)
CALCIUM: 9.2 mg/dL (ref 8.9–10.3)
CO2: 27 mmol/L (ref 22–32)
Chloride: 94 mmol/L — ABNORMAL LOW (ref 101–111)
Creatinine, Ser: 1.36 mg/dL — ABNORMAL HIGH (ref 0.44–1.00)
GFR calc Af Amer: 46 mL/min — ABNORMAL LOW (ref >60)
GFR, EST NON AFRICAN AMERICAN: 40 mL/min — AB (ref >60)
GLUCOSE: 119 mg/dL — AB (ref 65–99)
Potassium: 3.8 mmol/L (ref 3.5–5.1)
Sodium: 138 mmol/L (ref 135–145)

## 2017-10-14 LAB — URINALYSIS, ROUTINE W REFLEX MICROSCOPIC
Bilirubin Urine: NEGATIVE
Glucose, UA: NEGATIVE mg/dL
HGB URINE DIPSTICK: NEGATIVE
Ketones, ur: NEGATIVE mg/dL
Leukocytes, UA: NEGATIVE
Nitrite: NEGATIVE
Protein, ur: NEGATIVE mg/dL
SPECIFIC GRAVITY, URINE: 1.01 (ref 1.005–1.030)
pH: 7 (ref 5.0–8.0)

## 2017-10-14 LAB — GLUCOSE, CAPILLARY
GLUCOSE-CAPILLARY: 147 mg/dL — AB (ref 65–99)
GLUCOSE-CAPILLARY: 165 mg/dL — AB (ref 65–99)
Glucose-Capillary: 129 mg/dL — ABNORMAL HIGH (ref 65–99)
Glucose-Capillary: 130 mg/dL — ABNORMAL HIGH (ref 65–99)
Glucose-Capillary: 126 mg/dL — ABNORMAL HIGH (ref 65–99)

## 2017-10-14 NOTE — Progress Notes (Signed)
PROGRESS NOTE    Donna Graves  ZOX:[id number] DOB: 1951/01/05 DOA: 10/12/2017 PCP: Wilson Singer, MD    Brief Narrative:  67 year old female with a history of systolic heart failure, EF of 81-19%, admitted to the hospital with progressive weight gain, peripheral edema and shortness of breath. Found to have decompensated CHF.  Admitted for IV diuresis.   Assessment & Plan:   Principal Problem:   Acute on chronic systolic CHF (congestive heart failure) (HCC) Active Problems:   Type II diabetes mellitus (HCC)   Essential hypertension   Elevated troponin   Morbid obesity (HCC)   1. Acute on chronic systolic congestive heart failure.  Ejection fraction of 25-30%.    Currently on intravenous Lasix and metolazone.  Urine output today is 1.2 L.  She is still massively volume overloaded. No significant change in weight since admission.  Monitor intake and output and daily weights.  Continue fluid restriction.  Blood pressures have been running low and she appears becoming symptomatic with lightheadedness and dizziness.  Will hold further ACE inhibitors for now.  Continue beta-blockers.  Continue current treatments.  If she does not continue to have significant diuresis, may need to consider inotropes.  Will repeat echocardiogram 2. Diabetes.  Hold oral glimepiride.    Blood sugar stable on sliding scale 3. Hypertension.  Continue on outpatient regimen of Coreg.  Enalapril has been held due to hypotension 4. Elevated troponin.  Likely related to decompensated CHF.  Appears to be at her baseline level.  No complaints of chest pain. 5. Morbid obesity.  Patient is planning to follow-up at [facility] to be evaluated for bariatric surgery.  DVT prophylaxis: Lovenox Code Status: Full code Family Communication: No family present Disposition Plan: Discharge home once improved   Consultants:     Procedures:     Antimicrobials:      Subjective: Feels lightheaded and dizzy.   Feels generally unwell.  Still feels short of breath.  Objective: Vitals:   10/14/17 0802 10/14/17 1307 10/14/17 1459 10/14/17 1550  BP:  (!) 90/58 (!) 116/56   Pulse:  74 74   Resp:  18 18   Temp:   98.6 F (37 C)   TempSrc:   Oral   SpO2:  95% 93% 93%  Weight: (!) 158.3 kg (348 lb 15.8 oz)     Height:        Intake/Output Summary (Last 24 hours) at 10/14/2017 1731 Last data filed at 10/14/2017 1400 Gross per 24 hour  Intake 240 ml  Output 1200 ml  Net -960 ml   Filed Weights   10/12/17 1405 10/13/17 0300 10/14/17 0802  Weight: (!) 156.9 kg (345 lb 14.4 oz) (!) 157.9 kg (348 lb 1.6 oz) (!) 158.3 kg (348 lb 15.8 oz)    Examination:  General exam: Appears calm and comfortable  Respiratory system: Crackles at bases. Respiratory effort normal. Cardiovascular system: S1 & S2 heard, RRR. No JVD, murmurs, rubs, gallops or clicks. 1+ pedal edema. Gastrointestinal system: Abdomen is distended with abdominal wall pitting edema, soft and nontender. No organomegaly or masses felt. Normal bowel sounds heard. Central nervous system: Alert and oriented. No focal neurological deficits. Extremities: Symmetric 5 x 5 power. Skin: No rashes, lesions or ulcers Psychiatry: Judgement and insight appear normal. Mood & affect appropriate.     Data Reviewed: I have personally reviewed following labs and imaging studies  CBC: Recent Labs  Lab 10/12/17 1118  WBC 5.9  NEUTROABS 3.9  HGB 15.4*  HCT 49.3*  MCV 93.9  PLT 174   Basic Metabolic Panel: Recent Labs  Lab 10/12/17 1118 10/13/17 0529 10/14/17 0716  NA 134* 136 138  K 3.6 3.4* 3.8  CL 93* 91* 94*  CO2 25 29 27   GLUCOSE 260* 107* 119*  BUN 22* 22* 27*  CREATININE 1.14* 1.08* 1.36*  CALCIUM 9.0 9.0 9.2   GFR: Estimated Creatinine Clearance: 62.6 mL/min (A) (by C-G formula based on SCr of 1.36 mg/dL (H)). Liver Function Tests: Recent Labs  Lab 10/12/17 1118  AST 41  ALT 14  ALKPHOS 105  BILITOT 1.7*  PROT 6.6    ALBUMIN 3.2*   No results for input(s): LIPASE, AMYLASE in the last 168 hours. No results for input(s): AMMONIA in the last 168 hours. Coagulation Profile: No results for input(s): INR, PROTIME in the last 168 hours. Cardiac Enzymes: Recent Labs  Lab 10/12/17 1118 10/13/17 1304  TROPONINI 0.04* 0.05*   BNP (last 3 results) No results for input(s): PROBNP in the last 8760 hours. HbA1C: No results for input(s): HGBA1C in the last 72 hours. CBG: Recent Labs  Lab 10/13/17 0803 10/13/17 1206 10/13/17 1636 10/13/17 2222 10/14/17 0743  GLUCAP 115* 136* 96 163* 129*   Lipid Profile: No results for input(s): CHOL, HDL, LDLCALC, TRIG, CHOLHDL, LDLDIRECT in the last 72 hours. Thyroid Function Tests: No results for input(s): TSH, T4TOTAL, FREET4, T3FREE, THYROIDAB in the last 72 hours. Anemia Panel: No results for input(s): VITAMINB12, FOLATE, FERRITIN, TIBC, IRON, RETICCTPCT in the last 72 hours. Sepsis Labs: No results for input(s): PROCALCITON, LATICACIDVEN in the last 168 hours.  No results found for this or any previous visit (from the past 240 hour(s)).       Radiology Studies: No results found.      Scheduled Meds: . carvedilol  12.5 mg Oral BID WC  . citalopram  20 mg Oral Daily  . enoxaparin (LOVENOX) injection  80 mg Subcutaneous Q24H  . furosemide  80 mg Intravenous BID  . insulin aspart  0-15 Units Subcutaneous TID WC  . insulin aspart  0-5 Units Subcutaneous QHS  . metolazone  5 mg Oral BID  . pantoprazole  40 mg Oral Daily  . potassium chloride  20 mEq Oral BID  . simvastatin  40 mg Oral QPM  . sodium chloride flush  3 mL Intravenous Q12H  . thyroid  15 mg Oral Daily   Continuous Infusions: . sodium chloride       LOS: 2 days    Time spent:    Erick Blinks, MD [facility] Pager [phone]  If 7PM-7AM, please contact night-coverage [email or web address] Password [facility] 10/14/2017, 5:31 PM

## 2017-10-15 ENCOUNTER — Inpatient Hospital Stay (HOSPITAL_COMMUNITY): Payer: Medicare Other

## 2017-10-15 DIAGNOSIS — I509 Heart failure, unspecified: Secondary | ICD-10-CM

## 2017-10-15 LAB — GLUCOSE, CAPILLARY
GLUCOSE-CAPILLARY: 117 mg/dL — AB (ref 65–99)
GLUCOSE-CAPILLARY: 117 mg/dL — AB (ref 65–99)
Glucose-Capillary: 116 mg/dL — ABNORMAL HIGH (ref 65–99)
Glucose-Capillary: 122 mg/dL — ABNORMAL HIGH (ref 65–99)

## 2017-10-15 LAB — CBC
HEMATOCRIT: 51.5 % — AB (ref 36.0–46.0)
Hemoglobin: 16.2 g/dL — ABNORMAL HIGH (ref 12.0–15.0)
MCH: 29.1 pg (ref 26.0–34.0)
MCHC: 31.5 g/dL (ref 30.0–36.0)
MCV: 92.5 fL (ref 78.0–100.0)
Platelets: 161 10*3/uL (ref 150–400)
RBC: 5.57 MIL/uL — ABNORMAL HIGH (ref 3.87–5.11)
RDW: 16.6 % — AB (ref 11.5–15.5)
WBC: 5.6 10*3/uL (ref 4.0–10.5)

## 2017-10-15 LAB — ECHOCARDIOGRAM COMPLETE
HEIGHTINCHES: 64 in
WEIGHTICAEL: 5562.65 [oz_av]

## 2017-10-15 LAB — BASIC METABOLIC PANEL
Anion gap: 16 — ABNORMAL HIGH (ref 5–15)
BUN: 30 mg/dL — ABNORMAL HIGH (ref 6–20)
CALCIUM: 9.4 mg/dL (ref 8.9–10.3)
CO2: 28 mmol/L (ref 22–32)
CREATININE: 1.25 mg/dL — AB (ref 0.44–1.00)
Chloride: 93 mmol/L — ABNORMAL LOW (ref 101–111)
GFR, EST AFRICAN AMERICAN: 51 mL/min — AB (ref >60)
GFR, EST NON AFRICAN AMERICAN: 44 mL/min — AB (ref >60)
Glucose, Bld: 114 mg/dL — ABNORMAL HIGH (ref 65–99)
Potassium: 3.4 mmol/L — ABNORMAL LOW (ref 3.5–5.1)
SODIUM: 137 mmol/L (ref 135–145)

## 2017-10-15 MED ORDER — LORAZEPAM 2 MG/ML IJ SOLN
0.5000 mg | INTRAMUSCULAR | Status: DC | PRN
  Administered 2017-10-15 – 2017-10-19 (×3): 0.5 mg via INTRAVENOUS
  Filled 2017-10-15 (×4): qty 1

## 2017-10-15 NOTE — Progress Notes (Addendum)
  Echocardiogram 2D Echocardiogram has been performed.  Patient noncompliant. Grabbing tech's arm during length of exam. Should patient require further testing, patient will need to be more alert.    Donna Graves 10/15/2017, 9:28 AM

## 2017-10-15 NOTE — Progress Notes (Signed)
PROGRESS NOTE    Donna Graves  DIY:[id number] DOB: 09-13-1951 DOA: 10/12/2017 PCP: Wilson Singer, MD    Brief Narrative:  67 year old female with a history of systolic heart failure, EF of 07-68%, admitted to the hospital with progressive weight gain, peripheral edema and shortness of breath. Found to have decompensated CHF.  Admitted for IV diuresis.   Assessment & Plan:   Principal Problem:   Acute on chronic systolic CHF (congestive heart failure) (HCC) Active Problems:   Type II diabetes mellitus (HCC)   Essential hypertension   Elevated troponin   Morbid obesity (HCC)   1. Acute on chronic systolic congestive heart failure.  Ejection fraction of 25-30%.    Currently on intravenous Lasix and metolazone.  Urine output yesterday was 3.5 L.  She is still massively volume overloaded. No significant change in weight since admission.  Monitor intake and output and daily weights.  Continue fluid restriction.  Blood pressures have been running low and she appears to be becoming symptomatic with lightheadedness and dizziness.  ACE inhibitors currently on hold for now.  Continue beta-blockers.  Continue current treatments.  Echocardiogram was repeated but was found to be a poor quality study.  Will request cardiology assistance. ? If patient would benefit from inotropes versus heart failure team evaluation 2. Diabetes.  Hold oral glimepiride.    Blood sugar stable on sliding scale 3. Hypertension.  Continue on outpatient regimen of Coreg.  Enalapril has been held due to hypotension 4. Elevated troponin.  Likely related to decompensated CHF.  Appears to be at her baseline level.  No complaints of chest pain. 5. Morbid obesity.  Patient is planning to follow-up at [facility] to be evaluated for bariatric surgery.  DVT prophylaxis: Lovenox Code Status: Full code Family Communication: Discussed with patient's son and her sister at the bedside Disposition Plan: Discharge home once  improved   Consultants:     Procedures:     Antimicrobials:      Subjective: Family reports that patient had a difficult night.  Was anxious all night.  Recently received Ativan and is drowsy right now.  Still complaining of shortness of breath.  Objective: Vitals:   10/15/17 0122 10/15/17 0139 10/15/17 0500 10/15/17 0649  BP: (!) 98/52  (!) 88/51 (!) 102/56  Pulse: 77 72 72 73  Resp: 20  20   Temp:   97.6 F (36.4 C)   TempSrc:   Oral   SpO2: 95% 96% 93% 96%  Weight:   (!) 157.7 kg (347 lb 10.7 oz)   Height:        Intake/Output Summary (Last 24 hours) at 10/15/2017 1518 Last data filed at 10/15/2017 0700 Gross per 24 hour  Intake 3 ml  Output 2300 ml  Net -2297 ml   Filed Weights   10/13/17 0300 10/14/17 0802 10/15/17 0500  Weight: (!) 157.9 kg (348 lb 1.6 oz) (!) 158.3 kg (348 lb 15.8 oz) (!) 157.7 kg (347 lb 10.7 oz)    Examination:  General exam: Appears calm and comfortable  Respiratory system: Crackles at bases. Respiratory effort normal. Cardiovascular system: S1 & S2 heard, RRR. No JVD, murmurs, rubs, gallops or clicks. 1-2+ pedal edema. Gastrointestinal system: Abdomen is distended with abdominal wall pitting edema, soft and nontender. No organomegaly or masses felt. Normal bowel sounds heard. Central nervous system: Alert and oriented. No focal neurological deficits. Extremities: Symmetric 5 x 5 power. Skin: No rashes, lesions or ulcers Psychiatry: Judgement and insight appear normal. Mood &  affect appropriate.     Data Reviewed: I have personally reviewed following labs and imaging studies  CBC: Recent Labs  Lab 10/12/17 1118 10/15/17 0642  WBC 5.9 5.6  NEUTROABS 3.9  --   HGB 15.4* 16.2*  HCT 49.3* 51.5*  MCV 93.9 92.5  PLT 174 161   Basic Metabolic Panel: Recent Labs  Lab 10/12/17 1118 10/13/17 0529 10/14/17 0716 10/15/17 0642  NA 134* 136 138 137  K 3.6 3.4* 3.8 3.4*  CL 93* 91* 94* 93*  CO2 25 29 27 28   GLUCOSE 260* 107*  119* 114*  BUN 22* 22* 27* 30*  CREATININE 1.14* 1.08* 1.36* 1.25*  CALCIUM 9.0 9.0 9.2 9.4   GFR: Estimated Creatinine Clearance: 67.9 mL/min (A) (by C-G formula based on SCr of 1.25 mg/dL (H)). Liver Function Tests: Recent Labs  Lab 10/12/17 1118  AST 41  ALT 14  ALKPHOS 105  BILITOT 1.7*  PROT 6.6  ALBUMIN 3.2*   No results for input(s): LIPASE, AMYLASE in the last 168 hours. No results for input(s): AMMONIA in the last 168 hours. Coagulation Profile: No results for input(s): INR, PROTIME in the last 168 hours. Cardiac Enzymes: Recent Labs  Lab 10/12/17 1118 10/13/17 1304  TROPONINI 0.04* 0.05*   BNP (last 3 results) No results for input(s): PROBNP in the last 8760 hours. HbA1C: No results for input(s): HGBA1C in the last 72 hours. CBG: Recent Labs  Lab 10/14/17 0743 10/14/17 1119 10/14/17 1319 10/14/17 1633 10/14/17 2048  GLUCAP 129* 147* 165* 130* 126*   Lipid Profile: No results for input(s): CHOL, HDL, LDLCALC, TRIG, CHOLHDL, LDLDIRECT in the last 72 hours. Thyroid Function Tests: No results for input(s): TSH, T4TOTAL, FREET4, T3FREE, THYROIDAB in the last 72 hours. Anemia Panel: No results for input(s): VITAMINB12, FOLATE, FERRITIN, TIBC, IRON, RETICCTPCT in the last 72 hours. Sepsis Labs: No results for input(s): PROCALCITON, LATICACIDVEN in the last 168 hours.  No results found for this or any previous visit (from the past 240 hour(s)).       Radiology Studies: No results found.      Scheduled Meds: . carvedilol  12.5 mg Oral BID WC  . citalopram  20 mg Oral Daily  . enoxaparin (LOVENOX) injection  80 mg Subcutaneous Q24H  . furosemide  80 mg Intravenous BID  . insulin aspart  0-15 Units Subcutaneous TID WC  . insulin aspart  0-5 Units Subcutaneous QHS  . metolazone  5 mg Oral BID  . pantoprazole  40 mg Oral Daily  . potassium chloride  20 mEq Oral BID  . simvastatin  40 mg Oral QPM  . sodium chloride flush  3 mL Intravenous Q12H    . thyroid  15 mg Oral Daily   Continuous Infusions: . sodium chloride       LOS: 3 days    Time spent:    Erick Blinks, MD [facility] Pager [phone]  If 7PM-7AM, please contact night-coverage [email or web address] Password [email or web address] 10/15/2017, 3:18 PM

## 2017-10-15 NOTE — Progress Notes (Signed)
PT c/o chest pain and SOB. Repositioned PT and HOB is locked at 30 degrees to ease breathing effort. O2 sats are greater than 95% on 2L [place]. Tylenol and GI cocktail given to relief chest pain. PT seems more comfortable and effort is less labored. Continue to monitor. BP continues to be soft, PT and family state that she normally has a lower end BP.

## 2017-10-15 NOTE — Progress Notes (Signed)
PT nasal cannula has been replaced in her nares multiple times. PT educated and reminded to keep oxygen on. Continue to monitor.

## 2017-10-16 ENCOUNTER — Encounter (HOSPITAL_COMMUNITY): Payer: Self-pay | Admitting: Student

## 2017-10-16 DIAGNOSIS — I5023 Acute on chronic systolic (congestive) heart failure: Secondary | ICD-10-CM

## 2017-10-16 LAB — GLUCOSE, CAPILLARY
Glucose-Capillary: 94 mg/dL (ref 65–99)
Glucose-Capillary: 143 mg/dL — ABNORMAL HIGH (ref 65–99)
Glucose-Capillary: 152 mg/dL — ABNORMAL HIGH (ref 65–99)

## 2017-10-16 LAB — BASIC METABOLIC PANEL
ANION GAP: 16 — AB (ref 5–15)
BUN: 24 mg/dL — ABNORMAL HIGH (ref 6–20)
CALCIUM: 9.2 mg/dL (ref 8.9–10.3)
CO2: 31 mmol/L (ref 22–32)
CREATININE: 1.09 mg/dL — AB (ref 0.44–1.00)
Chloride: 91 mmol/L — ABNORMAL LOW (ref 101–111)
GFR, EST NON AFRICAN AMERICAN: 52 mL/min — AB (ref >60)
Glucose, Bld: 104 mg/dL — ABNORMAL HIGH (ref 65–99)
Potassium: 2.6 mmol/L — CL (ref 3.5–5.1)
Sodium: 138 mmol/L (ref 135–145)

## 2017-10-16 LAB — MAGNESIUM: MAGNESIUM: 1.9 mg/dL (ref 1.7–2.4)

## 2017-10-16 MED ORDER — METOLAZONE 5 MG PO TABS
5.0000 mg | ORAL_TABLET | Freq: Every day | ORAL | Status: DC
  Administered 2017-10-17 – 2017-10-18 (×2): 5 mg via ORAL
  Filled 2017-10-16 (×2): qty 1

## 2017-10-16 MED ORDER — POTASSIUM CHLORIDE CRYS ER 20 MEQ PO TBCR
40.0000 meq | EXTENDED_RELEASE_TABLET | Freq: Two times a day (BID) | ORAL | Status: DC
  Filled 2017-10-16: qty 2

## 2017-10-16 MED ORDER — FUROSEMIDE 10 MG/ML IJ SOLN
80.0000 mg | Freq: Two times a day (BID) | INTRAMUSCULAR | Status: DC
  Administered 2017-10-16 – 2017-10-20 (×8): 80 mg via INTRAVENOUS
  Filled 2017-10-16 (×9): qty 8

## 2017-10-16 MED ORDER — POTASSIUM CHLORIDE 10 MEQ/100ML IV SOLN
10.0000 meq | INTRAVENOUS | Status: AC
  Administered 2017-10-16 (×6): 10 meq via INTRAVENOUS
  Filled 2017-10-16 (×6): qty 100

## 2017-10-16 MED ORDER — POTASSIUM CHLORIDE CRYS ER 20 MEQ PO TBCR
40.0000 meq | EXTENDED_RELEASE_TABLET | ORAL | Status: AC
  Administered 2017-10-16 (×2): 40 meq via ORAL
  Filled 2017-10-16 (×2): qty 2

## 2017-10-16 MED ORDER — FUROSEMIDE 10 MG/ML IJ SOLN: 60.0000 mg | Freq: Two times a day (BID) | INTRAMUSCULAR | Status: DC

## 2017-10-16 NOTE — Progress Notes (Signed)
CRITICAL VALUE ALERT  Critical Value:  K 2.6  Date & Time Notied:  10/16/17 0715  Provider Notified: Memon  Orders Received/Actions taken: None at this time

## 2017-10-16 NOTE — Progress Notes (Signed)
PROGRESS NOTE    Donna Graves  ZOX:[id number] DOB: 1951-02-25 DOA: 10/12/2017 PCP: Wilson Singer, MD    Brief Narrative:  67 year old female with a history of systolic heart failure, EF of 81-19%, admitted to the hospital with progressive weight gain, peripheral edema and shortness of breath. Found to have decompensated CHF.  Admitted for IV diuresis.   Assessment & Plan:   Principal Problem:   Acute on chronic systolic CHF (congestive heart failure) (HCC) Active Problems:   Type II diabetes mellitus (HCC)   Essential hypertension   Elevated troponin   Morbid obesity (HCC)   1. Acute on chronic systolic congestive heart failure.  Ejection fraction of 25-30%.    Currently on intravenous Lasix and metolazone.  Urine output yesterday was 6.3 L.  She remains volume overloaded.  Weight has decreased from 348lbs to 335lbs since admission.  Monitor intake and output and daily weights.  Continue fluid restriction.  ACE inhibitors currently on hold for now due to hypotension.  Continue beta-blockers.  Continue current treatments.  Echocardiogram was poor study, therefore repeat study has been ordered.  Cardiology following 2. Diabetes.  Hold oral glimepiride.    Blood sugar stable on sliding scale 3. Hypertension.  Continue on outpatient regimen of Coreg.  Enalapril has been held due to hypotension 4. Elevated troponin.  Likely related to decompensated CHF.  Appears to be at her baseline level.  No complaints of chest pain. 5. Morbid obesity.  Patient is planning to follow-up at [facility] to be evaluated for bariatric surgery. 6. Hypokalemia.  Related to diuretics.  Replace  DVT prophylaxis: Lovenox Code Status: Full code Family Communication: Discussed with patient's son and her sister at the bedside Disposition Plan: Discharge home once improved   Consultants:   Cardiology  Procedures:     Antimicrobials:      Subjective: Feeling better today.  Shortness of  breath improving.  No chest pain.  Has had good urine output.  Objective: Vitals:   10/15/17 0649 10/15/17 1600 10/15/17 2045 10/16/17 0500  BP: (!) 102/56 (!) 102/56 140/78 (!) 100/55  Pulse: 73 74 71 70  Resp:  18 18 18   Temp:  98.9 F (37.2 C) 98.5 F (36.9 C) 98.4 F (36.9 C)  TempSrc:  Axillary Oral Oral  SpO2: 96% 96% 96% 95%  Weight:    (!) 152.1 kg (335 lb 5.1 oz)  Height:        Intake/Output Summary (Last 24 hours) at 10/16/2017 1701 Last data filed at 10/16/2017 1436 Gross per 24 hour  Intake 540 ml  Output 4900 ml  Net -4360 ml   Filed Weights   10/14/17 0802 10/15/17 0500 10/16/17 0500  Weight: (!) 158.3 kg (348 lb 15.8 oz) (!) 157.7 kg (347 lb 10.7 oz) (!) 152.1 kg (335 lb 5.1 oz)    Examination:  General exam: Appears calm and comfortable  Respiratory system: Crackles at bases. Respiratory effort normal. Cardiovascular system: S1 & S2 heard, RRR. No JVD, murmurs, rubs, gallops or clicks. 1-2+ pedal edema. Gastrointestinal system: Abdomen is distended with abdominal wall pitting edema, soft and nontender. No organomegaly or masses felt. Normal bowel sounds heard. Central nervous system: Alert and oriented. No focal neurological deficits. Extremities: Symmetric 5 x 5 power. Skin: No rashes, lesions or ulcers Psychiatry: Judgement and insight appear normal. Mood & affect appropriate.     Data Reviewed: I have personally reviewed following labs and imaging studies  CBC: Recent Labs  Lab 10/12/17 1118 10/15/17  4360  WBC 5.9 5.6  NEUTROABS 3.9  --   HGB 15.4* 16.2*  HCT 49.3* 51.5*  MCV 93.9 92.5  PLT 174 161   Basic Metabolic Panel: Recent Labs  Lab 10/12/17 1118 10/13/17 0529 10/14/17 0716 10/15/17 0642 10/16/17 0621  NA 134* 136 138 137 138  K 3.6 3.4* 3.8 3.4* 2.6*  CL 93* 91* 94* 93* 91*  CO2 25 29 27 28 31   GLUCOSE 260* 107* 119* 114* 104*  BUN 22* 22* 27* 30* 24*  CREATININE 1.14* 1.08* 1.36* 1.25* 1.09*  CALCIUM 9.0 9.0 9.2 9.4  9.2  MG  --   --   --   --  1.9   GFR: Estimated Creatinine Clearance: 76.1 mL/min (A) (by C-G formula based on SCr of 1.09 mg/dL (H)). Liver Function Tests: Recent Labs  Lab 10/12/17 1118  AST 41  ALT 14  ALKPHOS 105  BILITOT 1.7*  PROT 6.6  ALBUMIN 3.2*   No results for input(s): LIPASE, AMYLASE in the last 168 hours. No results for input(s): AMMONIA in the last 168 hours. Coagulation Profile: No results for input(s): INR, PROTIME in the last 168 hours. Cardiac Enzymes: Recent Labs  Lab 10/12/17 1118 10/13/17 1304  TROPONINI 0.04* 0.05*   BNP (last 3 results) No results for input(s): PROBNP in the last 8760 hours. HbA1C: No results for input(s): HGBA1C in the last 72 hours. CBG: Recent Labs  Lab 10/15/17 1614 10/15/17 2108 10/16/17 0729 10/16/17 1154 10/16/17 1626  GLUCAP 117* 117* 94 143* 152*   Lipid Profile: No results for input(s): CHOL, HDL, LDLCALC, TRIG, CHOLHDL, LDLDIRECT in the last 72 hours. Thyroid Function Tests: No results for input(s): TSH, T4TOTAL, FREET4, T3FREE, THYROIDAB in the last 72 hours. Anemia Panel: No results for input(s): VITAMINB12, FOLATE, FERRITIN, TIBC, IRON, RETICCTPCT in the last 72 hours. Sepsis Labs: No results for input(s): PROCALCITON, LATICACIDVEN in the last 168 hours.  No results found for this or any previous visit (from the past 240 hour(s)).       Radiology Studies: No results found.      Scheduled Meds: . carvedilol  12.5 mg Oral BID WC  . citalopram  20 mg Oral Daily  . enoxaparin (LOVENOX) injection  80 mg Subcutaneous Q24H  . furosemide  80 mg Intravenous BID  . insulin aspart  0-15 Units Subcutaneous TID WC  . insulin aspart  0-5 Units Subcutaneous QHS  . [START ON 10/17/2017] metolazone  5 mg Oral Daily  . pantoprazole  40 mg Oral Daily  . [START ON 10/17/2017] potassium chloride  40 mEq Oral BID  . simvastatin  40 mg Oral QPM  . sodium chloride flush  3 mL Intravenous Q12H  . thyroid  15 mg  Oral Daily   Continuous Infusions: . sodium chloride       LOS: 4 days    Time spent:    Erick Blinks, MD [facility] Pager [phone]  If 7PM-7AM, please contact night-coverage [email or web address] Password [facility] 10/16/2017, 5:01 PM

## 2017-10-16 NOTE — Consult Note (Signed)
Cardiology Consult    Patient ID: Donna Graves; [id number]; 09-Jul-1951   Admit date: 10/12/2017 Date of Consult: 10/16/2017  Primary Care Provider: Wilson Singer, MD Primary Cardiologist: Dr. Wyline Mood Primary Electrophysiologist: Dr. Ladona Ridgel  Patient Profile    Donna Graves is a 67 y.o. female with past medical history of chronic systolic CHF (EF 40-98% by echo in 04/2017, s/p Medtronic BiV ICD placement with upgrade in 05/2017 - LV Lead unable to be placed), nonischemic cardiomyopathy (cath in 2014 showing nonobstructive CAD), atrial tachycardia (s/p ablation, not on anticoagulation), HTN, HLD, and Type 2 DM who is being seen today for the evaluation of CHF at the request of Dr. Kerry Hough.   History of Present Illness    Ms. Donna Graves was recently admitted to [facility] from 11/29 - 08/19/2017 for a CHF exacerbation and diuresed well with IV Lasix and weight at time of discharge was 318 lbs. She followed-up with Dr. Wyline Mood on 08/24/2017 and weight was at 325 lbs but she was euvolemic by examination. She was continued on Torsemide 80mg  BID with Metolazone 2.5mg  on Tuesday's and Friday's. At her follow-up with Dr. Ladona Ridgel on 12/17, weight was up to 338 lbs and she reported consuming excess salt. Device interrogation on 1/21 showed her activity was less than 1 hour per day and thoracic impedance was abnormal suggesting fluid accumulation since 08/20/2017.   She presented to [facility] ED on 10/12/2017 with worsening dyspnea on exertion, abdominal distension, and lower extremity edema over the past several days. Reports her symptoms initially started 3-4 weeks ago but acutely progressed over the past week. Weight has been variable on her home scales as her husband reports it can be elevated 5 lbs one day then down 3-4 lbs the next. She denies any associated chest pain or palpitations. Sleeps in an adjustable bed at greater than a 45 degree angle most nights. She reports consuming a low-sodium diet as  her husband does the cooking, but as noted by prior office notes she does consume fast-food occasionally.   Initial labs showed WBC of 5.9, Hgb 15.4, platelets 174, Na+ 134, K+ 3.6, and creatinine 1.14. BNP 417. Initial troponin 0.04 with repeat of 0.05. CXR showed CHF with mild interstitial edema. EKG showing V-paced rhythm, HR 72. Weight was elevated to 347 lbs.   She was admitted and placed on IV Lasix 80mg  BID. She had minimal urine output and Metolazone 5mg  BID was added on 1/25. Since then, her output has drastically improved and she is -11L (recorded as net -6.3 L yesterday) thus far with weight down to 335 lbs this morning. Most recent BMET this morning showed creatinine was remaining stable at 1.09 with K+ significantly low at 2.6 (additional K+ supplementation already ordered).   Past Medical History:  Diagnosis Date  . Atrial fibrillation and flutter (HCC)    History of RFA and ultimately AV node ablation  . Cardiomyopathy (HCC)    a. LVEF 25-30% by echo in 04/2017.  Marland Kitchen Chronic systolic heart failure (HCC)   . Depression   . GERD (gastroesophageal reflux disease)   . Gout   . History of cardiac catheterization 07/23/2013   RA 17; RV 43/20 PCWP 27.  LAD normal  LCx  norma  RCA normal.  LVEF 55%  . Hyperlipidemia   . Hypertension   . Hypothyroidism   . 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 Ladona Ridgel  . Type 2 diabetes mellitus (  [facility])     Past Surgical History:  Procedure Laterality Date  . ANAL FISSURE REPAIR    . BACK SURGERY    . BIV UPGRADE N/A 05/23/2017   Procedure: BiVI Upgrade;  Surgeon: Marinus Maw, MD;  Location: [facility] INVASIVE CV LAB;  Service: Cardiovascular;  Laterality: N/A;  . ICD IMPLANT  05/23/2017   biv  . Left toe surgery       Home Medications:  Prior to Admission medications   Medication Sig Start Date End Date Taking? Authorizing Provider  acetaminophen (TYLENOL) 650 MG CR  tablet Take 650-1,300 mg by mouth every 8 (eight) hours as needed for pain.   Yes [provider]  acidophilus (RISAQUAD) CAPS capsule Take 1 capsule by mouth daily. ULTIMATE FLORA PROBIOTIC   Yes [provider]  ARMOUR THYROID 15 MG tablet Take 1 tablet by mouth daily. 10/05/17  Yes [provider]  carvedilol (COREG) 12.5 MG tablet TAKE 12.5 mg  BY MOUTH TWICE DAILY 03/01/17  Yes [provider]  Cholecalciferol (VITAMIN D PO) Take 7,000 Units by mouth daily.   Yes [provider]  enalapril (VASOTEC) 10 MG tablet Take 10 mg by mouth 2 (two) times daily.    Yes [provider]  escitalopram (LEXAPRO) 5 MG tablet Take 1 tablet by mouth at bedtime. 07/24/17  Yes [provider]  fluticasone (FLONASE) 50 MCG/ACT nasal spray Place 1-2 sprays into both nostrils daily as needed for allergies. 07/17/17  Yes [provider]  glimepiride (AMARYL) 2 MG tablet Take 4 mg by mouth daily. BEFORE A MEAL   Yes [provider]  magnesium oxide (MAG-OX) 400 MG tablet Take 400 mg by mouth daily.   Yes [provider]  metolazone (ZAROXOLYN) 2.5 MG tablet TAKE 1 TABLET BY MOUTH 2 TIMES A WEEK ON TUESDAY AND FRIDAY 07/31/17  Yes Jodelle Gross, NP  Misc Natural Products (BLACK CHERRY CONCENTRATE PO) Take 2 tablets by mouth daily.    Yes [provider]  Multiple Vitamins-Minerals (ALIVE ONCE DAILY WOMENS 50+ PO) Take 1 tablet by mouth daily.   Yes [provider]  omeprazole (PRILOSEC) 20 MG capsule Take 20 mg by mouth every morning.   Yes [provider]  polycarbophil (FIBERCON) 625 MG tablet Take 625 mg by mouth 4 (four) times daily.   Yes [provider]  potassium chloride SA (K-DUR,KLOR-CON) 20 MEQ tablet TAKE 3 TABLETS BY MOUTH EVERY MORNING AND 3 TABLETS BY MOUTH IN THE EVENING(PM) 09/07/17  Yes Ivey Cina, Dorothe Pea, MD  Red Yeast Rice Extract (RED YEAST RICE PO) Take 2 tablets by mouth  daily.    Yes [provider]  simvastatin (ZOCOR) 40 MG tablet Take 40 mg by mouth every evening.    Yes [provider]  torsemide (DEMADEX) 20 MG tablet Take 4 tablets (80 mg total) 2 (two) times daily by mouth. Patient taking differently: Take 60 mg by mouth 2 (two) times daily.  07/24/17 10/22/17 Yes Jary Louvier, Dorothe Pea, MD  dicyclomine (BENTYL) 10 MG capsule Take 10 mg by mouth 3 (three) times daily as needed for spasms.    [provider]    Inpatient Medications: Scheduled Meds: . carvedilol  12.5 mg Oral BID WC  . citalopram  20 mg Oral Daily  . enoxaparin (LOVENOX) injection  80 mg Subcutaneous Q24H  . furosemide  80 mg Intravenous BID  . insulin aspart  0-15 Units Subcutaneous TID WC  . insulin aspart  0-5  Units Subcutaneous QHS  . [START ON 10/17/2017] metolazone  5 mg Oral Daily  . pantoprazole  40 mg Oral Daily  . potassium chloride  40 mEq Oral Q3H  . [START ON 10/17/2017] potassium chloride  40 mEq Oral BID  . simvastatin  40 mg Oral QPM  . sodium chloride flush  3 mL Intravenous Q12H  . thyroid  15 mg Oral Daily   Continuous Infusions: . sodium chloride    . potassium chloride     PRN Meds: sodium chloride, acetaminophen, dicyclomine, gi cocktail, LORazepam, ondansetron (ZOFRAN) IV, sodium chloride flush  Allergies:    Allergies  Allergen Reactions  . Codeine Rash    Social History:   Social History   Socioeconomic History  . Marital status: Married    Spouse name: Not on file  . Number of children: Not on file  . Years of education: Not on file  . Highest education level: Not on file  Social Needs  . Financial resource strain: Not on file  . Food insecurity - worry: Not on file  . Food insecurity - inability: Not on file  . Transportation needs - medical: Not on file  . Transportation needs - non-medical: Not on file  Occupational History  . Not on file  Tobacco Use  . Smoking status: Never Smoker  . Smokeless tobacco:  Never Used  Substance and Sexual Activity  . Alcohol use: No    Alcohol/week: 0.0 oz  . Drug use: No  . Sexual activity: Not Currently    Birth control/protection: None  Other Topics Concern  . Not on file  Social History Narrative  . Not on file     Family History:    Family History  Problem Relation Age of Onset  . Diabetes Mother 13  . Heart attack Mother   . Hypertension Mother   . Diabetes Brother 64  . Arthritis Father 22      Review of Systems    General:  No chills, fever, night sweats or weight changes.  Cardiovascular:  No chest pain, orthopnea, palpitations, paroxysmal nocturnal dyspnea. Positive for dyspnea on exertion and edema.  Dermatological: No rash, lesions/masses Respiratory: No cough, dyspnea Urologic: No hematuria, dysuria Abdominal:   No nausea, vomiting, diarrhea, bright red blood per rectum, melena, or hematemesis. Positive for abdominal distension.  Neurologic:  No visual changes, wkns, changes in mental status. All other systems reviewed and are otherwise negative except as noted above.  Physical Exam/Data    Vitals:   10/15/17 0649 10/15/17 1600 10/15/17 2045 10/16/17 0500  BP: (!) 102/56 (!) 102/56 140/78 (!) 100/55  Pulse: 73 74 71 70  Resp:  18 18 18   Temp:  98.9 F (37.2 C) 98.5 F (36.9 C) 98.4 F (36.9 C)  TempSrc:  Axillary Oral Oral  SpO2: 96% 96% 96% 95%  Weight:    (!) 335 lb 5.1 oz (152.1 kg)  Height:        Intake/Output Summary (Last 24 hours) at 10/16/2017 0944 Last data filed at 10/15/2017 2231 Gross per 24 hour  Intake 360 ml  Output 4700 ml  Net -4340 ml   Filed Weights   10/14/17 0802 10/15/17 0500 10/16/17 0500  Weight: (!) 348 lb 15.8 oz (158.3 kg) (!) 347 lb 10.7 oz (157.7 kg) (!) 335 lb 5.1 oz (152.1 kg)   Body mass index is 57.56 kg/m.   General: Pleasant, obese Caucasian female appearing in NAD Psych: Normal affect. Neuro: Alert and oriented X  3. Moves all extremities spontaneously. HEENT:  Normal  Neck: Supple without bruits. JVD unable to be assessed secondary to body habitus. Lungs:  Resp regular and unlabored, decreased breath sounds along bases bilaterally. Heart: RRR no s3, s4, or murmurs. Abdomen: Soft, non-tender, BS + x 4. Appears distended.  Extremities: No clubbing or cyanosis. 1+ pitting lower extremity edema bilaterally. DP/PT/Radials 2+ and equal bilaterally.   EKG:  The EKG was personally reviewed and demonstrates:  V-paced rhythm, HR 72. Telemetry:  Telemetry was personally reviewed and demonstrates: V-paced, HR in 70's.   Labs/Studies     Relevant CV Studies:  Echocardiogram: 10/15/2017 Study Conclusions  - Left ventricle: Overall LVF appears reduced but cannot accurately   comment on EF or wall motion as endocardial segments are not   adequately visualized. Recommend repeat limited echo with   definity. The cavity size was normal. There was mild concentric   hypertrophy. - Aortic valve: Poorly visualized. Valve area (VTI): 1.77 cm^2.   Valve area (Vmax): 2.08 cm^2. Valve area (Vmean): 2.27 cm^2. - Mitral valve: Valve area by pressure half-time: 2.32 cm^2. Valve   area by continuity equation (using LVOT flow): 2.28 cm^2. - Left atrium: The atrium was mildly dilated. - Impressions: The findings indicate significant septal-lateral   left ventricular wall dyssynchrony secondary to RV pacing  Impressions:  - The findings indicate significant septal-lateral left ventricular   wall dyssynchrony secondary to RV pacing  Laboratory Data:  Chemistry Recent Labs  Lab 10/14/17 0716 10/15/17 0642 10/16/17 0621  NA 138 137 138  K 3.8 3.4* 2.6*  CL 94* 93* 91*  CO2 27 28 31   GLUCOSE 119* 114* 104*  BUN 27* 30* 24*  CREATININE 1.36* 1.25* 1.09*  CALCIUM 9.2 9.4 9.2  GFRNONAA 40* 44* 52*  GFRAA 46* 51* >60  ANIONGAP 17* 16* 16*    Recent Labs  Lab 10/12/17 1118  PROT 6.6  ALBUMIN 3.2*  AST 41  ALT 14  ALKPHOS 105  BILITOT 1.7*    Hematology Recent Labs  Lab 10/12/17 1118 10/15/17 0642  WBC 5.9 5.6  RBC 5.25* 5.57*  HGB 15.4* 16.2*  HCT 49.3* 51.5*  MCV 93.9 92.5  MCH 29.3 29.1  MCHC 31.2 31.5  RDW 16.6* 16.6*  PLT 174 161   Cardiac Enzymes Recent Labs  Lab 10/12/17 1118 10/13/17 1304  TROPONINI 0.04* 0.05*   No results for input(s): TROPIPOC in the last 168 hours.  BNP Recent Labs  Lab 10/12/17 1118  BNP 417.0*    DDimer No results for input(s): DDIMER in the last 168 hours.  Radiology/Studies:  Dg Chest 2 View  Result Date: 10/12/2017 CLINICAL DATA:  Shortness of breath since October 08, 2017. History of CHF, atrial fibrillation, coronary artery disease, nonsmoker. EXAM: CHEST  2 VIEW COMPARISON:  Chest x-ray of August 17, 2017 FINDINGS: The lungs are adequately inflated. The right hemidiaphragm remains higher than the left. The interstitial markings are coarse but fairly stable. The cardiac silhouette is enlarged. The pulmonary vascularity is mildly engorged with mild cephalization noted. The ICD is in stable position. The mediastinum is normal in width. There is no significant pleural effusion. The bony thorax exhibits no acute abnormality. IMPRESSION: CHF with mild interstitial edema. There has not been significant interval change since the previous study. Electronically Signed   By: David  [place] M.D.   On: 10/12/2017 11:03     Assessment & Plan    1. Acute on Chronic Systolic CHF - the patient has  a known reduced EF of 25-30% by echo in 04/2017 (repeat echo obtained this admission but was poor quality).  - the patient has experienced worsening dyspnea on exertion and abdominal distension over the past few weeks with recent device interrogation showing abnormal thoracic impendence since 08/20/2017 (the day following recent hospital discharge). Weight was 318 lbs at that time, elevated to 347 lbs on admission.  -  BNP 417. Initial troponin 0.04 with repeat of 0.05. CXR showed CHF with mild  interstitial edema.  - initially experienced poor output with IV Lasix but this improved with the addition of PO Metolazone 5mg  BID. Net output is now -11L (recorded as net -6.3 L yesterday) and weight is down 12 lbs (347 --> 335 lbs). Kidney function remains stable at 1.09, therefore continue with aggressive diuresis. Obtain strict I&O's along with daily weights. Repeat BMET in AM. Will require several more days of diuresis as she remains significantly volume overloaded.  2. Nonischemic Cardiomyopathy - s/p Medtronic BiV ICD placement with upgrade in 05/2017 - LV Lead unable to be placed. - followed by Dr. Ladona Ridgel as an outpatient.   3. Atrial Tachycardia -F. Maintaining V-pacing on telemetry this admission. - remains on Coreg 12.5mg  BID.   4. HTN - BP has been variable at 100/55 - 140/78 within the past 24 hours.  - PTA Enalapril held due to episodes of hypotension. Can reduce Coreg if additional BP support is needed.   5. HLD - LFT's WNL. Remains on Simvastatin 40mg  daily.   6. Hypokalemia - K+ 2.6 this AM, secondary to aggressive diuresis. - on K-dur 60 mEq BID as an outpatient, currently ordered as 20 mEq BID. IV and PO replacement already scheduled for today. Will increase to 40 mEq BID starting tomorrow and further titrate as needed.    For questions or updates, please contact [facility] Please consult [email or web address] for contact info under Cardiology/STEMI.  Signed, Ellsworth Lennox, PA-C 10/16/2017, 9:44 AM Pager: [phone]   Patient seen and discussed with PA Iran Ouch, I agree with her documentation above. History of chronic systolic HF/NICM with LVEF 25-30%, atach, admitted with acute on chronic systolic HF. Historically medication and dietary compliance have been an issue. Has presented previously with massive volume overload up to 30 liters or so. On admission appears she had gained about25 lbs since last discharge.   Negaitve 6.4 liters yesterday, negative 11  liters since admission. She is on lasix 80mg  bid. Renal function trending down with diuresis consistent with venous congestion and CHF. Initially slow diuresis with appropriate uptitration of diuretics, fairly rapid diuresis yesterdaywith severe electrolyte abnormalites, will lower metolazone to 5mg  daily to lower risk of AKI. Other medical therapy for her CHF with coreg 12.5mg  bid, ACE-I on hold due to soft bp's. Hypokalemia, aggressive IV and PO replacement. Mg is 1.9. Will require several days of IV diuresis.  Repeat echo yesterday poor visualization, will order with contrast.   Dina Rich MD

## 2017-10-17 ENCOUNTER — Inpatient Hospital Stay (HOSPITAL_COMMUNITY): Payer: Medicare Other

## 2017-10-17 DIAGNOSIS — R0600 Dyspnea, unspecified: Secondary | ICD-10-CM

## 2017-10-17 LAB — BASIC METABOLIC PANEL
ANION GAP: 14 (ref 5–15)
BUN: 21 mg/dL — ABNORMAL HIGH (ref 6–20)
CO2: 34 mmol/L — AB (ref 22–32)
CREATININE: 1.04 mg/dL — AB (ref 0.44–1.00)
Calcium: 9.2 mg/dL (ref 8.9–10.3)
Chloride: 89 mmol/L — ABNORMAL LOW (ref 101–111)
GFR calc non Af Amer: 55 mL/min — ABNORMAL LOW (ref >60)
Glucose, Bld: 131 mg/dL — ABNORMAL HIGH (ref 65–99)
Potassium: 2.6 mmol/L — CL (ref 3.5–5.1)
SODIUM: 137 mmol/L (ref 135–145)

## 2017-10-17 LAB — ECHOCARDIOGRAM LIMITED
HEIGHTINCHES: 64 in
WEIGHTICAEL: 5316.8 [oz_av]

## 2017-10-17 LAB — GLUCOSE, CAPILLARY: Glucose-Capillary: 157 mg/dL — ABNORMAL HIGH (ref 65–99)

## 2017-10-17 LAB — POTASSIUM: POTASSIUM: 3 mmol/L — AB (ref 3.5–5.1)

## 2017-10-17 MED ORDER — POTASSIUM CHLORIDE CRYS ER 20 MEQ PO TBCR
40.0000 meq | EXTENDED_RELEASE_TABLET | Freq: Once | ORAL | Status: AC
  Administered 2017-10-17: 40 meq via ORAL
  Filled 2017-10-17: qty 2

## 2017-10-17 MED ORDER — PERFLUTREN LIPID MICROSPHERE
1.0000 mL | INTRAVENOUS | Status: AC | PRN
  Administered 2017-10-17: 10 mL via INTRAVENOUS
  Filled 2017-10-17: qty 10

## 2017-10-17 MED ORDER — POTASSIUM CHLORIDE CRYS ER 20 MEQ PO TBCR
40.0000 meq | EXTENDED_RELEASE_TABLET | ORAL | Status: AC
  Administered 2017-10-17 (×3): 40 meq via ORAL
  Filled 2017-10-17 (×2): qty 2

## 2017-10-17 MED ORDER — DOCUSATE SODIUM 100 MG PO CAPS
200.0000 mg | ORAL_CAPSULE | Freq: Every day | ORAL | Status: DC
  Administered 2017-10-17 – 2017-10-20 (×4): 200 mg via ORAL
  Filled 2017-10-17 (×4): qty 2

## 2017-10-17 MED ORDER — POTASSIUM CHLORIDE 10 MEQ/100ML IV SOLN
10.0000 meq | INTRAVENOUS | Status: AC
  Administered 2017-10-17: 10 meq via INTRAVENOUS
  Filled 2017-10-17: qty 100

## 2017-10-17 MED ORDER — POTASSIUM CHLORIDE CRYS ER 20 MEQ PO TBCR
40.0000 meq | EXTENDED_RELEASE_TABLET | Freq: Two times a day (BID) | ORAL | Status: DC
  Administered 2017-10-17 – 2017-10-18 (×2): 40 meq via ORAL
  Filled 2017-10-17 (×2): qty 2

## 2017-10-17 NOTE — Progress Notes (Signed)
CRITICAL VALUE ALERT  Critical Value:  Potassium 2.6  Date & Time Notied:  10/17/17 0720  Provider Notified: Memon  Orders Received/Actions taken: none at this time

## 2017-10-17 NOTE — Progress Notes (Signed)
New order for Colace, given tonight with 2200 medications. Tele removed per order. Will continue to monitor pt

## 2017-10-17 NOTE — Progress Notes (Signed)
Progress Note  Patient Name: Donna Graves Date of Encounter: 10/17/2017  Primary Cardiologist: Dr. Dina Rich  Subjective   Breathing more easily.  No chest pain or palpitations.  No abdominal pain.  Inpatient Medications    Scheduled Meds: . carvedilol  12.5 mg Oral BID WC  . citalopram  20 mg Oral Daily  . enoxaparin (LOVENOX) injection  80 mg Subcutaneous Q24H  . furosemide  80 mg Intravenous BID  . insulin aspart  0-15 Units Subcutaneous TID WC  . insulin aspart  0-5 Units Subcutaneous QHS  . metolazone  5 mg Oral Daily  . pantoprazole  40 mg Oral Daily  . potassium chloride  40 mEq Oral Q3H  . simvastatin  40 mg Oral QPM  . sodium chloride flush  3 mL Intravenous Q12H  . thyroid  15 mg Oral Daily   Continuous Infusions: . sodium chloride 250 mL (10/17/17 0926)  . potassium chloride 10 mEq (10/17/17 0925)   PRN Meds: sodium chloride, acetaminophen, dicyclomine, gi cocktail, LORazepam, ondansetron (ZOFRAN) IV, sodium chloride flush   Vital Signs    Vitals:   10/16/17 2100 10/16/17 2229 10/17/17 0300 10/17/17 0500  BP: (!) 84/38 (!) 90/40 122/64   Pulse: 69  72   Resp: 18  18   Temp: 98.2 F (36.8 C)  98 F (36.7 C)   TempSrc: Oral  Oral   SpO2: 93%  94%   Weight:    (!) 332 lb 4.8 oz (150.7 kg)  Height:        Intake/Output Summary (Last 24 hours) at 10/17/2017 0951 Last data filed at 10/17/2017 1610 Gross per 24 hour  Intake 540 ml  Output 4750 ml  Net -4210 ml   Filed Weights   10/15/17 0500 10/16/17 0500 10/17/17 0500  Weight: (!) 347 lb 10.7 oz (157.7 kg) (!) 335 lb 5.1 oz (152.1 kg) (!) 332 lb 4.8 oz (150.7 kg)    Telemetry    Paced rhythm.  Personally reviewed.  Physical Exam   GEN: Morbidly obese.  No acute distress.   Neck:  Increased girth. Cardiac: RRR, no gallop.  Respiratory: Nonlabored. Clear to auscultation bilaterally. GI:  Obese with pannus, nontender, bowel sounds present. MS:  Chronic appearing leg edema; No  deformity. Neuro:  Nonfocal. Psych: Alert and oriented x 3. Normal affect.  Labs    Chemistry Recent Labs  Lab 10/12/17 1118  10/15/17 9604 10/16/17 0621 10/17/17 0619  NA 134*   < > 137 138 137  K 3.6   < > 3.4* 2.6* 2.6*  CL 93*   < > 93* 91* 89*  CO2 25   < > 28 31 34*  GLUCOSE 260*   < > 114* 104* 131*  BUN 22*   < > 30* 24* 21*  CREATININE 1.14*   < > 1.25* 1.09* 1.04*  CALCIUM 9.0   < > 9.4 9.2 9.2  PROT 6.6  --   --   --   --   ALBUMIN 3.2*  --   --   --   --   AST 41  --   --   --   --   ALT 14  --   --   --   --   ALKPHOS 105  --   --   --   --   BILITOT 1.7*  --   --   --   --   GFRNONAA 49*   < > 44* 52*  55*  GFRAA 57*   < > 51* >60 >60  ANIONGAP 16*   < > 16* 16* 14   < > = values in this interval not displayed.     Hematology Recent Labs  Lab 10/12/17 1118 10/15/17 0642  WBC 5.9 5.6  RBC 5.25* 5.57*  HGB 15.4* 16.2*  HCT 49.3* 51.5*  MCV 93.9 92.5  MCH 29.3 29.1  MCHC 31.2 31.5  RDW 16.6* 16.6*  PLT 174 161    Cardiac Enzymes Recent Labs  Lab 10/12/17 1118 10/13/17 1304  TROPONINI 0.04* 0.05*   No results for input(s): TROPIPOC in the last 168 hours.   BNP Recent Labs  Lab 10/12/17 1118  BNP 417.0*     Radiology    No results found.  Cardiac Studies   Echocardiogram 10/15/2017: Study Conclusions  - Left ventricle: Overall LVF appears reduced but cannot accurately   comment on EF or wall motion as endocardial segments are not   adequately visualized. Recommend repeat limited echo with   definity. The cavity size was normal. There was mild concentric   hypertrophy. - Aortic valve: Poorly visualized. Valve area (VTI): 1.77 cm^2.   Valve area (Vmax): 2.08 cm^2. Valve area (Vmean): 2.27 cm^2. - Mitral valve: Valve area by pressure half-time: 2.32 cm^2. Valve   area by continuity equation (using LVOT flow): 2.28 cm^2. - Left atrium: The atrium was mildly dilated. - Impressions: The findings indicate significant septal-lateral    left ventricular wall dyssynchrony secondary to RV pacing  Impressions:  - The findings indicate significant septal-lateral left ventricular   wall dyssynchrony secondary to RV pacing  Patient Profile     67 y.o. female  past medical history of chronic systolic CHF (EF 28-41% by echo in 04/2017, s/p Medtronic BiV ICD placement with upgrade in 05/2017 - LV Lead unable to be placed), nonischemic cardiomyopathy (cath in 2014 showing nonobstructive CAD), atrial tachycardia (s/p ablation, not on anticoagulation), HTN, HLD, and Type 2 DM.  He is currently admitted with significant volume overload in the setting of acute on chronic combined heart failure.  Assessment & Plan    1.  Acute on chronic combined heart failure.  Patient continues to diurese well on IV Lasix.  She had approximately 4200 cc net out more than in for the last 24 hours.  Beginning to feel better clinically.  Creatinine is stable.  She does have hypokalemia with potassium supplements initiated.  2.  Nonischemic cardiomyopathy status post Medtronic biventricular ICD (LV lead not placed).  She follows with Dr. Ladona Ridgel.  No recent device shocks.  3.  History of atrial tachycardia without recurrence.  Ventricular pacing evident by telemetry.  4.  Hyperlipidemia, continues on Zocor.  Continue with current dose IV Lasix as well as Zaroxolyn.  Potassium is being repleted today, expect that she will need a high dose standing regimen.  Still needs most likely a few more days of diuresis to get back to baseline.  Signed, Nona Dell, MD  10/17/2017, 9:51 AM

## 2017-10-17 NOTE — Care Management Note (Signed)
Case Management Note  Patient Details  Name: Donna Graves MRN: [id number] Date of Birth: [birth year]   If discussed at Long Length of Stay Meetings, dates discussed:   10/17/2017 Additional Comments:  Haneef Hallquist, [name], RN 10/17/2017, 12:05 PM

## 2017-10-17 NOTE — Progress Notes (Signed)
*  PRELIMINARY RESULTS* Echocardiogram 2D Echocardiogram LIMITED WITH DEFINITY has been performed.  Donna Graves 10/17/2017, 12:09 PM

## 2017-10-17 NOTE — Progress Notes (Signed)
PROGRESS NOTE    Donna Graves  WOE:[id number] DOB: 08-31-1951 DOA: 10/12/2017 PCP: Wilson Singer, MD    Brief Narrative:  67 year old female with a history of systolic heart failure, EF of 00-37%, admitted to the hospital with progressive weight gain, peripheral edema and shortness of breath. Found to have decompensated CHF.  Admitted for IV diuresis and is having good diuresis.  Will likely be in the hospital several more days.   Assessment & Plan:   Principal Problem:   Acute on chronic systolic CHF (congestive heart failure) (HCC) Active Problems:   Type II diabetes mellitus (HCC)   Essential hypertension   Elevated troponin   Morbid obesity (HCC)   1. Acute on chronic systolic congestive heart failure.  Ejection fraction of 25-30%.    Currently on intravenous Lasix and metolazone.  Urine output yesterday was 4.7 L.  She remains volume overloaded.  Weight has decreased from 348lbs to 332lbs since admission.  Monitor intake and output and daily weights.  Continue fluid restriction.  ACE inhibitors currently on hold for now due to hypotension.  Continue beta-blockers.  Continue current treatments.  Echocardiogram confirms low EF. Cardiology following 2. Diabetes.  Hold oral glimepiride.    Blood sugar stable on sliding scale 3. Hypertension.  Continue on outpatient regimen of Coreg.  Enalapril has been held due to hypotension 4. Elevated troponin.  Likely related to decompensated CHF.  Appears to be at her baseline level.  No complaints of chest pain. 5. Morbid obesity.  Patient is planning to follow-up at [facility] to be evaluated for bariatric surgery. 6. Hypokalemia.  Related to diuretics.  Replace  DVT prophylaxis: Lovenox Code Status: Full code Family Communication: Discussed with patient's son and her sister at the bedside Disposition Plan: Discharge home once improved   Consultants:   Cardiology  Procedures:  Echo:- Limited study with Definity. Images are  still quite limited. LVEF   estimated at approximately 25% based on the most consistent   views. Cannot specifically comment on focal wall motion although   there does look to be septal dyssynergy. There is also moderate    right ventricular dysfunction.  Antimicrobials:      Subjective: Feels that shortness of breath is improving.  Had difficulty tolerating potassium IV runs due to burning sensation in the vein.  No chest pain  Objective: Vitals:   10/16/17 2229 10/17/17 0300 10/17/17 0500 10/17/17 1532  BP: (!) 90/40 122/64  134/76  Pulse:  72  76  Resp:  18  18  Temp:  98 F (36.7 C)  98 F (36.7 C)  TempSrc:  Oral    SpO2:  94%  98%  Weight:   (!) 150.7 kg (332 lb 4.8 oz)   Height:        Intake/Output Summary (Last 24 hours) at 10/17/2017 1636 Last data filed at 10/17/2017 1300 Gross per 24 hour  Intake 720 ml  Output 5050 ml  Net -4330 ml   Filed Weights   10/15/17 0500 10/16/17 0500 10/17/17 0500  Weight: (!) 157.7 kg (347 lb 10.7 oz) (!) 152.1 kg (335 lb 5.1 oz) (!) 150.7 kg (332 lb 4.8 oz)    Examination:  General exam: Appears calm and comfortable  Respiratory system: Crackles at bases. Respiratory effort normal. Cardiovascular system: S1 & S2 heard, RRR. No JVD, murmurs, rubs, gallops or clicks. 1-2+ pedal edema. Gastrointestinal system: Abdomen is distended with abdominal wall pitting edema, soft and nontender. No organomegaly or masses felt.  Normal bowel sounds heard. Central nervous system: Alert and oriented. No focal neurological deficits. Extremities: Symmetric 5 x 5 power. Skin: No rashes, lesions or ulcers Psychiatry: Judgement and insight appear normal. Mood & affect appropriate.     Data Reviewed: I have personally reviewed following labs and imaging studies  CBC: Recent Labs  Lab 10/12/17 1118 10/15/17 0642  WBC 5.9 5.6  NEUTROABS 3.9  --   HGB 15.4* 16.2*  HCT 49.3* 51.5*  MCV 93.9 92.5  PLT 174 161   Basic Metabolic  Panel: Recent Labs  Lab 10/13/17 0529 10/14/17 0716 10/15/17 0642 10/16/17 0621 10/17/17 0619 10/17/17 1534  NA 136 138 137 138 137  --   K 3.4* 3.8 3.4* 2.6* 2.6* 3.0*  CL 91* 94* 93* 91* 89*  --   CO2 29 27 28 31  34*  --   GLUCOSE 107* 119* 114* 104* 131*  --   BUN 22* 27* 30* 24* 21*  --   CREATININE 1.08* 1.36* 1.25* 1.09* 1.04*  --   CALCIUM 9.0 9.2 9.4 9.2 9.2  --   MG  --   --   --  1.9  --   --    GFR: Estimated Creatinine Clearance: 79.3 mL/min (A) (by C-G formula based on SCr of 1.04 mg/dL (H)). Liver Function Tests: Recent Labs  Lab 10/12/17 1118  AST 41  ALT 14  ALKPHOS 105  BILITOT 1.7*  PROT 6.6  ALBUMIN 3.2*   No results for input(s): LIPASE, AMYLASE in the last 168 hours. No results for input(s): AMMONIA in the last 168 hours. Coagulation Profile: No results for input(s): INR, PROTIME in the last 168 hours. Cardiac Enzymes: Recent Labs  Lab 10/12/17 1118 10/13/17 1304  TROPONINI 0.04* 0.05*   BNP (last 3 results) No results for input(s): PROBNP in the last 8760 hours. HbA1C: No results for input(s): HGBA1C in the last 72 hours. CBG: Recent Labs  Lab 10/15/17 1614 10/15/17 2108 10/16/17 0729 10/16/17 1154 10/16/17 1626  GLUCAP 117* 117* 94 143* 152*   Lipid Profile: No results for input(s): CHOL, HDL, LDLCALC, TRIG, CHOLHDL, LDLDIRECT in the last 72 hours. Thyroid Function Tests: No results for input(s): TSH, T4TOTAL, FREET4, T3FREE, THYROIDAB in the last 72 hours. Anemia Panel: No results for input(s): VITAMINB12, FOLATE, FERRITIN, TIBC, IRON, RETICCTPCT in the last 72 hours. Sepsis Labs: No results for input(s): PROCALCITON, LATICACIDVEN in the last 168 hours.  No results found for this or any previous visit (from the past 240 hour(s)).       Radiology Studies: No results found.      Scheduled Meds: . carvedilol  12.5 mg Oral BID WC  . citalopram  20 mg Oral Daily  . enoxaparin (LOVENOX) injection  80 mg Subcutaneous  Q24H  . furosemide  80 mg Intravenous BID  . insulin aspart  0-15 Units Subcutaneous TID WC  . insulin aspart  0-5 Units Subcutaneous QHS  . metolazone  5 mg Oral Daily  . pantoprazole  40 mg Oral Daily  . potassium chloride  40 mEq Oral BID  . potassium chloride  40 mEq Oral Once  . simvastatin  40 mg Oral QPM  . sodium chloride flush  3 mL Intravenous Q12H  . thyroid  15 mg Oral Daily   Continuous Infusions: . sodium chloride 250 mL (10/17/17 0926)     LOS: 5 days    Time spent:    Erick Blinks, MD [facility] Pager [phone]  If 7PM-7AM,  please contact night-coverage [email or web address] Password [facility] 10/17/2017, 4:36 PM

## 2017-10-17 NOTE — Progress Notes (Signed)
Notified Dr. Kerry Hough that patient refused any further IV potassium bags due to the potassium hurting her arm too much.  Dr. Kerry Hough stated he would recheck potassium after 3 doses of 40 meq KCL PO, and then reassess need for IV potassium with patient.

## 2017-10-17 NOTE — Progress Notes (Signed)
Dr. Craige Cotta paged to see if pt could get something ordered for constipation. Waiting for call back/orders.

## 2017-10-18 DIAGNOSIS — I471 Supraventricular tachycardia: Secondary | ICD-10-CM

## 2017-10-18 DIAGNOSIS — E876 Hypokalemia: Secondary | ICD-10-CM

## 2017-10-18 DIAGNOSIS — I10 Essential (primary) hypertension: Secondary | ICD-10-CM

## 2017-10-18 DIAGNOSIS — J9601 Acute respiratory failure with hypoxia: Secondary | ICD-10-CM

## 2017-10-18 DIAGNOSIS — R748 Abnormal levels of other serum enzymes: Secondary | ICD-10-CM

## 2017-10-18 DIAGNOSIS — Z6841 Body Mass Index (BMI) 40.0 and over, adult: Secondary | ICD-10-CM

## 2017-10-18 LAB — BASIC METABOLIC PANEL
Anion gap: 15 (ref 5–15)
BUN: 18 mg/dL (ref 6–20)
CHLORIDE: 88 mmol/L — AB (ref 101–111)
CO2: 34 mmol/L — ABNORMAL HIGH (ref 22–32)
CREATININE: 0.92 mg/dL (ref 0.44–1.00)
Calcium: 9 mg/dL (ref 8.9–10.3)
GFR calc Af Amer: >60 mL/min (ref >60)
GFR calc non Af Amer: >60 mL/min (ref >60)
GLUCOSE: 122 mg/dL — AB (ref 65–99)
Potassium: 2.8 mmol/L — ABNORMAL LOW (ref 3.5–5.1)
Sodium: 137 mmol/L (ref 135–145)

## 2017-10-18 LAB — CBC
HCT: 47.2 % — ABNORMAL HIGH (ref 36.0–46.0)
Hemoglobin: 15.1 g/dL — ABNORMAL HIGH (ref 12.0–15.0)
MCH: 29.2 pg (ref 26.0–34.0)
MCHC: 32 g/dL (ref 30.0–36.0)
MCV: 91.3 fL (ref 78.0–100.0)
PLATELETS: 128 10*3/uL — AB (ref 150–400)
RBC: 5.17 MIL/uL — ABNORMAL HIGH (ref 3.87–5.11)
RDW: 18 % — ABNORMAL HIGH (ref 11.5–15.5)
WBC: 4.7 10*3/uL (ref 4.0–10.5)

## 2017-10-18 LAB — GLUCOSE, CAPILLARY: Glucose-Capillary: 198 mg/dL — ABNORMAL HIGH (ref 65–99)

## 2017-10-18 MED ORDER — POTASSIUM CHLORIDE CRYS ER 20 MEQ PO TBCR
40.0000 meq | EXTENDED_RELEASE_TABLET | Freq: Three times a day (TID) | ORAL | Status: DC
  Administered 2017-10-18 (×2): 40 meq via ORAL
  Filled 2017-10-18 (×2): qty 2

## 2017-10-18 NOTE — Progress Notes (Signed)
O2 removed from patient - O2 sats 92-94& on RA while resting and awake.  Will continue to monitor

## 2017-10-18 NOTE — Progress Notes (Signed)
Progress Note  Patient Name: Donna Graves Date of Encounter: 10/18/2017  Primary Cardiologist: Dr. Dina Rich  Subjective   Eating breakfast.  No chest pain or shortness of breath at rest.  No palpitations.  Inpatient Medications    Scheduled Meds: . carvedilol  12.5 mg Oral BID WC  . citalopram  20 mg Oral Daily  . docusate sodium  200 mg Oral QHS  . enoxaparin (LOVENOX) injection  80 mg Subcutaneous Q24H  . furosemide  80 mg Intravenous BID  . insulin aspart  0-15 Units Subcutaneous TID WC  . insulin aspart  0-5 Units Subcutaneous QHS  . metolazone  5 mg Oral Daily  . pantoprazole  40 mg Oral Daily  . potassium chloride  40 mEq Oral BID  . simvastatin  40 mg Oral QPM  . sodium chloride flush  3 mL Intravenous Q12H  . thyroid  15 mg Oral Daily   Continuous Infusions: . sodium chloride Stopped (10/17/17 1144)   PRN Meds: sodium chloride, acetaminophen, dicyclomine, gi cocktail, LORazepam, ondansetron (ZOFRAN) IV, sodium chloride flush   Vital Signs    Vitals:   10/17/17 1532 10/17/17 2000 10/18/17 0543 10/18/17 0748  BP: 134/76 105/62 (!) 106/50   Pulse: 76 71 66   Resp: 18 19 18    Temp: 98 F (36.7 C) 97.9 F (36.6 C) 98.2 F (36.8 C)   TempSrc:  Oral    SpO2: 98% 96% 94% 95%  Weight:   (!) 323 lb 10.2 oz (146.8 kg)   Height:        Intake/Output Summary (Last 24 hours) at 10/18/2017 0934 Last data filed at 10/18/2017 0500 Gross per 24 hour  Intake 743 ml  Output 4950 ml  Net -4207 ml   Filed Weights   10/16/17 0500 10/17/17 0500 10/18/17 0543  Weight: (!) 335 lb 5.1 oz (152.1 kg) (!) 332 lb 4.8 oz (150.7 kg) (!) 323 lb 10.2 oz (146.8 kg)    Physical Exam   GEN:  Morbidly obese.  No acute distress.   Neck:  Increased girth without obvious JVD. Cardiac: RRR, no gallop.  Respiratory: Nonlabored. Clear to auscultation bilaterally. GI:  Obese with pannus, bowel sounds present. MS:  Chronic appearing but improving leg edema; No  deformity. Neuro:  Nonfocal. Psych: Alert and oriented x 3. Normal affect.  Labs    Chemistry Recent Labs  Lab 10/12/17 1118  10/16/17 0621 10/17/17 0619 10/17/17 1534 10/18/17 0618  NA 134*   < > 138 137  --  137  K 3.6   < > 2.6* 2.6* 3.0* 2.8*  CL 93*   < > 91* 89*  --  88*  CO2 25   < > 31 34*  --  34*  GLUCOSE 260*   < > 104* 131*  --  122*  BUN 22*   < > 24* 21*  --  18  CREATININE 1.14*   < > 1.09* 1.04*  --  0.92  CALCIUM 9.0   < > 9.2 9.2  --  9.0  PROT 6.6  --   --   --   --   --   ALBUMIN 3.2*  --   --   --   --   --   AST 41  --   --   --   --   --   ALT 14  --   --   --   --   --   ALKPHOS 105  --   --   --   --   --  BILITOT 1.7*  --   --   --   --   --   GFRNONAA 49*   < > 52* 55*  --  >60  GFRAA 57*   < > >60 >60  --  >60  ANIONGAP 16*   < > 16* 14  --  15   < > = values in this interval not displayed.     Hematology Recent Labs  Lab 10/12/17 1118 10/15/17 0642 10/18/17 0618  WBC 5.9 5.6 4.7  RBC 5.25* 5.57* 5.17*  HGB 15.4* 16.2* 15.1*  HCT 49.3* 51.5* 47.2*  MCV 93.9 92.5 91.3  MCH 29.3 29.1 29.2  MCHC 31.2 31.5 32.0  RDW 16.6* 16.6* 18.0*  PLT 174 161 128*    Cardiac Enzymes Recent Labs  Lab 10/12/17 1118 10/13/17 1304  TROPONINI 0.04* 0.05*   No results for input(s): TROPIPOC in the last 168 hours.   BNP Recent Labs  Lab 10/12/17 1118  BNP 417.0*     Radiology    No results found.  Cardiac Studies   Echocardiogram 10/15/2017: Study Conclusions  - Left ventricle: Overall LVF appears reduced but cannot accurately comment on EF or wall motion as endocardial segments are not adequately visualized. Recommend repeat limited echo with definity. The cavity size was normal. There was mild concentric hypertrophy. - Aortic valve: Poorly visualized. Valve area (VTI): 1.77 cm^2. Valve area (Vmax): 2.08 cm^2. Valve area (Vmean): 2.27 cm^2. - Mitral valve: Valve area by pressure half-time: 2.32 cm^2. Valve area by  continuity equation (using LVOT flow): 2.28 cm^2. - Left atrium: The atrium was mildly dilated. - Impressions: The findings indicate significant septal-lateral left ventricular wall dyssynchrony secondary to RV pacing  Impressions:  - The findings indicate significant septal-lateral left ventricular wall dyssynchrony secondary to RV pacing  Limited echocardiogram with Definity 10/17/2017: Study Conclusions  - Left ventricle: The estimated ejection fraction was 25%. - Ventricular septum: Septal motion showed abnormal function and   dyssynergy. - Mitral valve: Mildly calcified annulus. - Right ventricle: The cavity size was mildly dilated. Systolic   function was moderately reduced.  Impressions:  - Limited study with Definity. Images are still quite limited. LVEF   estimated at approximately 25% based on the most consistent   views. Cannot specifically comment on focal wall motion although   there does look to be septal dyssynergy. There is also moderate   right ventricular dysfunction.  Patient Profile     67 y.o. 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  He is currently admitted with significant volume overload in the setting of acute on chronic combined heart failure.  Assessment & Plan    1.  Acute on chronic combined heart failure.  Patient continues to feel better clinically, had net out greater than in of 3900 cc last 24 hours.  2.  Hypokalemia.  3.  Nonischemic cardiomyopathy with LVEF approximately 25%.  Patient also has moderate right ventricular dysfunction.  4.  History of atrial tachycardia.  No recent palpitations.  Would continue with current dose of IV Lasix and  supplemental Zaroxolyn.  Increase KCl to 40 mEq 3 times a day.  Follow up BMET in a.m.  Signed, Nona Dell, MD  10/18/2017, 9:34 AM

## 2017-10-18 NOTE — Progress Notes (Signed)
PROGRESS NOTE  Donna Graves VWU:[id number] DOB: 1951-08-23 DOA: 10/12/2017 PCP: Wilson Singer, MD  Brief History:  67 year old female with a history of atrial tachycardia status post AV nodal ablation, systolic CHF, hypertension, hyperlipidemia, diabetes mellitus, hypothyroidism presented with worsening shortness of breath times 4 days with associated orthopnea and increasing lower extremity edema.  Patient also complained of increasing abdominal girth and increasing weight.  At the time of admission, the patient was noted to be 20 pounds above her last discharge weight.  Chest x-ray showed pulmonary edema.  The patient was started on intravenous furosemide with good clinical results.  Cardiology was consulted to assist with management.  Notably, the patient was recently admitted to the hospital from 08/17/2017 through 08/19/2017 for CHF exacerbation.  Her discharge weight was 318 pounds.  Assessment/Plan: Acute on chronic systolic CHF -10/17/2016 echo EF 25%, septal dyssynergy, mildly reduced RV function -NEG 20 L since admission -admission weight 347 -Continue intravenous furosemide and metolazone -Daily weights -remains fluid overloaded  Acute respiratory failure with hypoxia -Multifactorial including CHF in the setting of OHS/OSA -Wean oxygen for saturation greater than 92% -Presently stable on 2 L  Nonischemic cardiomyopathy - s/p [employer] BiV ICD placement with upgrade in 05/2017 - LV Lead unable to be placed. - followed by Dr. Ladona Ridgel as an outpatient.   Atrial Tachycardia -s/p ablation -s/p PPM  Diabetes mellitus type 2 -Holding Amaryl -08/18/2017 hemoglobin A1c 7.8 -Continue NovoLog sliding scale  Elevated troponin -Secondary to CHF exacerbation -No chest pain -trend is flat  Essential hypertension -Holding enalapril secondary to soft blood pressures -Continue carvedilol for now  Hyperlipidemia -Continue statin  Hypokalemia -Replete -Check  magnesium  Morbid obesity -BMI 55.5   Disposition Plan:   Home when cleared by cardiology Family Communication:   Spouse updated at bedside 1/30--Total time spent 35 minutes.  Greater than 50% spent face to face counseling and coordinating care.   Consultants:  cardiology  Code Status:  FULL  DVT Prophylaxis:   [place] Lovenox   Procedures: As Listed in Progress Note Above  Antibiotics: None    Subjective: Patient denies fevers, chills, headache, chest pain, dyspnea, nausea, vomiting, diarrhea, abdominal pain, dysuria, hematuria, hematochezia, and melena.   Objective: Vitals:   10/17/17 1532 10/17/17 2000 10/18/17 0543 10/18/17 0748  BP: 134/76 105/62 (!) 106/50   Pulse: 76 71 66   Resp: 18 19 18    Temp: 98 F (36.7 C) 97.9 F (36.6 C) 98.2 F (36.8 C)   TempSrc:  Oral    SpO2: 98% 96% 94% 95%  Weight:   (!) 146.8 kg (323 lb 10.2 oz)   Height:        Intake/Output Summary (Last 24 hours) at 10/18/2017 1748 Last data filed at 10/18/2017 1200 Gross per 24 hour  Intake 480 ml  Output 3900 ml  Net -3420 ml   Weight change: -3.93 kg (-10.6 oz) Exam:   General:  Pt is alert, follows commands appropriately, not in acute distress  HEENT: No icterus, No thrush, No neck mass, [place]/AT  Cardiovascular: RRR, S1/S2, no rubs, no gallops  Respiratory: fine bibasilar crackles, no wheeze  Abdomen: Soft/+BS, non tender, non distended, no guarding+anasarca  Extremities: 2 + LE edema, No lymphangitis, No petechiae, No rashes, no synovitis   Data Reviewed: I have personally reviewed following labs and imaging studies Basic Metabolic Panel: Recent Labs  Lab 10/14/17 0716 10/15/17 2956 10/16/17 0621 10/17/17 0619 10/17/17 1534 10/18/17  0618  NA 138 137 138 137  --  137  K 3.8 3.4* 2.6* 2.6* 3.0* 2.8*  CL 94* 93* 91* 89*  --  88*  CO2 27 28 31  34*  --  34*  GLUCOSE 119* 114* 104* 131*  --  122*  BUN 27* 30* 24* 21*  --  18  CREATININE 1.36* 1.25* 1.09* 1.04*  --   0.92  CALCIUM 9.2 9.4 9.2 9.2  --  9.0  MG  --   --  1.9  --   --   --    Liver Function Tests: Recent Labs  Lab 10/12/17 1118  AST 41  ALT 14  ALKPHOS 105  BILITOT 1.7*  PROT 6.6  ALBUMIN 3.2*   No results for input(s): LIPASE, AMYLASE in the last 168 hours. No results for input(s): AMMONIA in the last 168 hours. Coagulation Profile: No results for input(s): INR, PROTIME in the last 168 hours. CBC: Recent Labs  Lab 10/12/17 1118 10/15/17 0642 10/18/17 0618  WBC 5.9 5.6 4.7  NEUTROABS 3.9  --   --   HGB 15.4* 16.2* 15.1*  HCT 49.3* 51.5* 47.2*  MCV 93.9 92.5 91.3  PLT 174 161 128*   Cardiac Enzymes: Recent Labs  Lab 10/12/17 1118 10/13/17 1304  TROPONINI 0.04* 0.05*   BNP: Invalid input(s): POCBNP CBG: Recent Labs  Lab 10/15/17 2108 10/16/17 0729 10/16/17 1154 10/16/17 1626 10/17/17 2158  GLUCAP 117* 94 143* 152* 157*   HbA1C: No results for input(s): HGBA1C in the last 72 hours. Urine analysis:    Component Value Date/Time   COLORURINE YELLOW 10/14/2017 1750   APPEARANCEUR CLEAR 10/14/2017 1750   LABSPEC 1.010 10/14/2017 1750   PHURINE 7.0 10/14/2017 1750   GLUCOSEU NEGATIVE 10/14/2017 1750   HGBUR NEGATIVE 10/14/2017 1750   BILIRUBINUR NEGATIVE 10/14/2017 1750   KETONESUR NEGATIVE 10/14/2017 1750   PROTEINUR NEGATIVE 10/14/2017 1750   NITRITE NEGATIVE 10/14/2017 1750   LEUKOCYTESUR NEGATIVE 10/14/2017 1750   Sepsis Labs: @LABRCNTIP (procalcitonin:4,lacticidven:4) )No results found for this or any previous visit (from the past 240 hour(s)).   Scheduled Meds: . carvedilol  12.5 mg Oral BID WC  . citalopram  20 mg Oral Daily  . docusate sodium  200 mg Oral QHS  . enoxaparin (LOVENOX) injection  80 mg Subcutaneous Q24H  . furosemide  80 mg Intravenous BID  . insulin aspart  0-15 Units Subcutaneous TID WC  . insulin aspart  0-5 Units Subcutaneous QHS  . metolazone  5 mg Oral Daily  . pantoprazole  40 mg Oral Daily  . potassium chloride  40  mEq Oral TID  . simvastatin  40 mg Oral QPM  . sodium chloride flush  3 mL Intravenous Q12H  . thyroid  15 mg Oral Daily   Continuous Infusions: . sodium chloride Stopped (10/17/17 1144)    Procedures/Studies: Dg Chest 2 View  Result Date: 10/12/2017 CLINICAL DATA:  Shortness of breath since October 08, 2017. History of CHF, atrial fibrillation, coronary artery disease, nonsmoker. EXAM: CHEST  2 VIEW COMPARISON:  Chest x-ray of August 17, 2017 FINDINGS: The lungs are adequately inflated. The right hemidiaphragm remains higher than the left. The interstitial markings are coarse but fairly stable. The cardiac silhouette is enlarged. The pulmonary vascularity is mildly engorged with mild cephalization noted. The ICD is in stable position. The mediastinum is normal in width. There is no significant pleural effusion. The bony thorax exhibits no acute abnormality. IMPRESSION: CHF with mild interstitial edema. There has  not been significant interval change since the previous study. Electronically Signed   By: Jacquie Lukes  [place] M.D.   On: 10/12/2017 11:03    Catarina Hartshorn, DO  [facility] Pager [phone]  If 7PM-7AM, please contact night-coverage [email or web address] Password [email or web address] 10/18/2017, 5:48 PM   LOS: 6 days

## 2017-10-19 LAB — BASIC METABOLIC PANEL
Anion gap: 15 (ref 5–15)
BUN: 16 mg/dL (ref 6–20)
CHLORIDE: 88 mmol/L — AB (ref 101–111)
CO2: 37 mmol/L — ABNORMAL HIGH (ref 22–32)
CREATININE: 0.91 mg/dL (ref 0.44–1.00)
Calcium: 9.2 mg/dL (ref 8.9–10.3)
GFR calc Af Amer: >60 mL/min (ref >60)
GFR calc non Af Amer: >60 mL/min (ref >60)
Glucose, Bld: 151 mg/dL — ABNORMAL HIGH (ref 65–99)
Potassium: 2.7 mmol/L — CL (ref 3.5–5.1)
SODIUM: 140 mmol/L (ref 135–145)

## 2017-10-19 LAB — GLUCOSE, CAPILLARY
GLUCOSE-CAPILLARY: 113 mg/dL — AB (ref 65–99)
GLUCOSE-CAPILLARY: 161 mg/dL — AB (ref 65–99)
GLUCOSE-CAPILLARY: 126 mg/dL — AB (ref 65–99)
GLUCOSE-CAPILLARY: 109 mg/dL — AB (ref 65–99)
GLUCOSE-CAPILLARY: 151 mg/dL — AB (ref 65–99)
GLUCOSE-CAPILLARY: 139 mg/dL — AB (ref 65–99)
Glucose-Capillary: 155 mg/dL — ABNORMAL HIGH (ref 65–99)
Glucose-Capillary: 173 mg/dL — ABNORMAL HIGH (ref 65–99)
Glucose-Capillary: 143 mg/dL — ABNORMAL HIGH (ref 65–99)
Glucose-Capillary: 143 mg/dL — ABNORMAL HIGH (ref 65–99)
Glucose-Capillary: 149 mg/dL — ABNORMAL HIGH (ref 65–99)

## 2017-10-19 LAB — MAGNESIUM: MAGNESIUM: 1.6 mg/dL — AB (ref 1.7–2.4)

## 2017-10-19 MED ORDER — POTASSIUM CHLORIDE CRYS ER 20 MEQ PO TBCR
40.0000 meq | EXTENDED_RELEASE_TABLET | Freq: Once | ORAL | Status: AC
  Administered 2017-10-19: 40 meq via ORAL
  Filled 2017-10-19: qty 2

## 2017-10-19 MED ORDER — POTASSIUM CHLORIDE 10 MEQ/100ML IV SOLN
10.0000 meq | INTRAVENOUS | Status: AC
  Administered 2017-10-19 (×3): 10 meq via INTRAVENOUS
  Filled 2017-10-19 (×4): qty 100

## 2017-10-19 MED ORDER — POTASSIUM CHLORIDE CRYS ER 20 MEQ PO TBCR
60.0000 meq | EXTENDED_RELEASE_TABLET | Freq: Three times a day (TID) | ORAL | Status: DC
  Administered 2017-10-19 – 2017-10-21 (×7): 60 meq via ORAL
  Filled 2017-10-19 (×7): qty 3

## 2017-10-19 MED ORDER — MAGNESIUM SULFATE 2 GM/50ML IV SOLN
2.0000 g | Freq: Once | INTRAVENOUS | Status: AC
  Administered 2017-10-19: 2 g via INTRAVENOUS
  Filled 2017-10-19: qty 50

## 2017-10-19 MED ORDER — MAGNESIUM OXIDE 400 (241.3 MG) MG PO TABS
400.0000 mg | ORAL_TABLET | Freq: Every day | ORAL | Status: DC
  Administered 2017-10-20 – 2017-10-21 (×2): 400 mg via ORAL
  Filled 2017-10-19 (×3): qty 1

## 2017-10-19 MED ORDER — MAGNESIUM SULFATE 2 GM/50ML IV SOLN: 2.0000 g | Freq: Once | INTRAVENOUS | Status: DC

## 2017-10-19 MED ORDER — SPIRONOLACTONE 25 MG PO TABS
25.0000 mg | ORAL_TABLET | Freq: Every day | ORAL | Status: DC
  Administered 2017-10-19 – 2017-10-21 (×3): 25 mg via ORAL
  Filled 2017-10-19 (×3): qty 1

## 2017-10-19 MED ORDER — ENOXAPARIN SODIUM 80 MG/0.8ML ~~LOC~~ SOLN
70.0000 mg | SUBCUTANEOUS | Status: DC
  Administered 2017-10-19 – 2017-10-20 (×2): 70 mg via SUBCUTANEOUS
  Filled 2017-10-19 (×2): qty 0.8

## 2017-10-19 NOTE — Progress Notes (Signed)
Progress Note  Patient Name: Donna Graves Date of Encounter: 10/19/2017  Primary Cardiologist: Dr. Dina Rich  Subjective   Feels less bloating and shortness of breath by the day.  Appetite is good.  No chest pain or palpitations.  Inpatient Medications    Scheduled Meds: . carvedilol  12.5 mg Oral BID WC  . citalopram  20 mg Oral Daily  . docusate sodium  200 mg Oral QHS  . enoxaparin (LOVENOX) injection  70 mg Subcutaneous Q24H  . furosemide  80 mg Intravenous BID  . insulin aspart  0-15 Units Subcutaneous TID WC  . insulin aspart  0-5 Units Subcutaneous QHS  . metolazone  5 mg Oral Daily  . pantoprazole  40 mg Oral Daily  . potassium chloride  40 mEq Oral TID  . simvastatin  40 mg Oral QPM  . sodium chloride flush  3 mL Intravenous Q12H  . thyroid  15 mg Oral Daily   Continuous Infusions: . sodium chloride Stopped (10/17/17 1144)  . potassium chloride 10 mEq (10/19/17 0823)   PRN Meds: sodium chloride, acetaminophen, dicyclomine, gi cocktail, LORazepam, ondansetron (ZOFRAN) IV, sodium chloride flush   Vital Signs    Vitals:   10/18/17 1800 10/18/17 2146 10/19/17 0500 10/19/17 0713  BP: 112/73 109/77  115/75  Pulse: 70 85  71  Resp: 18 20    Temp: 98 F (36.7 C) 98.7 F (37.1 C)  98.4 F (36.9 C)  TempSrc: Oral Oral  Oral  SpO2: 94% 95%  92%  Weight:   (!) 317 lb 14.5 oz (144.2 kg)   Height:        Intake/Output Summary (Last 24 hours) at 10/19/2017 0840 Last data filed at 10/18/2017 2148 Gross per 24 hour  Intake -  Output 3850 ml  Net -3850 ml   Filed Weights   10/17/17 0500 10/18/17 0543 10/19/17 0500  Weight: (!) 332 lb 4.8 oz (150.7 kg) (!) 323 lb 10.2 oz (146.8 kg) (!) 317 lb 14.5 oz (144.2 kg)    Physical Exam   GEN:  Morbidly obese.  No acute distress.   Neck: No JVD. Cardiac: RRR, no gallop.  Respiratory: Nonlabored. Clear to auscultation bilaterally. GI:  Obese with pannus, nontender, bowel sounds present. MS:  Improving leg  edema; No deformity. Neuro:  Nonfocal. Psych: Alert and oriented x 3. Normal affect.  Labs    Chemistry Recent Labs  Lab 10/12/17 1118  10/17/17 0619 10/17/17 1534 10/18/17 0618 10/19/17 0504  NA 134*   < > 137  --  137 140  K 3.6   < > 2.6* 3.0* 2.8* 2.7*  CL 93*   < > 89*  --  88* 88*  CO2 25   < > 34*  --  34* 37*  GLUCOSE 260*   < > 131*  --  122* 151*  BUN 22*   < > 21*  --  18 16  CREATININE 1.14*   < > 1.04*  --  0.92 0.91  CALCIUM 9.0   < > 9.2  --  9.0 9.2  PROT 6.6  --   --   --   --   --   ALBUMIN 3.2*  --   --   --   --   --   AST 41  --   --   --   --   --   ALT 14  --   --   --   --   --  ALKPHOS 105  --   --   --   --   --   BILITOT 1.7*  --   --   --   --   --   GFRNONAA 49*   < > 55*  --  >60 >60  GFRAA 57*   < > >60  --  >60 >60  ANIONGAP 16*   < > 14  --  15 15   < > = values in this interval not displayed.     Hematology Recent Labs  Lab 10/12/17 1118 10/15/17 0642 10/18/17 0618  WBC 5.9 5.6 4.7  RBC 5.25* 5.57* 5.17*  HGB 15.4* 16.2* 15.1*  HCT 49.3* 51.5* 47.2*  MCV 93.9 92.5 91.3  MCH 29.3 29.1 29.2  MCHC 31.2 31.5 32.0  RDW 16.6* 16.6* 18.0*  PLT 174 161 128*    Cardiac Enzymes Recent Labs  Lab 10/12/17 1118 10/13/17 1304  TROPONINI 0.04* 0.05*   No results for input(s): TROPIPOC in the last 168 hours.   BNP Recent Labs  Lab 10/12/17 1118  BNP 417.0*     Radiology    No results found.  Cardiac Studies   Echocardiogram 10/17/2017: Study Conclusions  - Left ventricle: The estimated ejection fraction was 25%. - Ventricular septum: Septal motion showed abnormal function and   dyssynergy. - Mitral valve: Mildly calcified annulus. - Right ventricle: The cavity size was mildly dilated. Systolic   function was moderately reduced.  Impressions:  - Limited study with Definity. Images are still quite limited. LVEF   estimated at approximately 25% based on the most consistent   views. Cannot specifically comment on  focal wall motion although   there does look to be septal dyssynergy. There is also moderate   right ventricular dysfunction.  Patient Profile     67 y.o. female  with past medical history of chronic systolic CHF (EF 76-73% by echo in 04/2017,s/p Medtronic BiV ICD placement with upgrade in 05/2017 - LV Lead unable to be placed), nonischemic cardiomyopathy (cath in 2014 showing nonobstructive CAD), atrial tachycardia (s/p ablation, not on anticoagulation), HTN, HLD, and Type 2 DM.He is currently admitted with significant volume overload in the setting of acute on chronic combined heart failure.  Assessment & Plan    1.  Acute on chronic combined heart failure.  Continues to diurese quite well with decrease in weight.  Output more than intake of 3800 cc last 24 hours.  Renal function has been stable.  Close to suspected dry weight.  2.  Hypokalemia in the setting of diuresis.  3.  Nonischemic cardiomyopathy with LVEF approximately 25% and moderate right ventricular dysfunction.  4.  History of atrial tachycardia, currently quiescent.  Plan to increase potassium supplements, start magnesium supplements as well.  Discontinue Zaroxolyn and start Aldactone.  Continue IV Lasix today and then convert back to [facility] tomorrow.  Holding off resumption of ACE inhibitor for now given low normal blood pressure.  This can be resumed as an outpatient.  Signed, Nona Dell, MD  10/19/2017, 8:40 AM

## 2017-10-19 NOTE — Progress Notes (Signed)
PROGRESS NOTE  Donna Graves:[id number] DOB: [birth year] DOA: 10/12/2017 PCP: Wilson Singer, MD  Brief History:  67 year old female with a history of atrial tachycardia status post AV nodal ablation, systolic CHF, hypertension, hyperlipidemia, diabetes mellitus, hypothyroidism presented with worsening shortness of breath times 4 days with associated orthopnea and increasing lower extremity edema.  Patient also complained of increasing abdominal girth and increasing weight.  At the time of admission, the patient was noted to be 20 pounds above her last discharge weight.  Chest x-ray showed pulmonary edema.  The patient was started on intravenous furosemide with good clinical results.  Cardiology was consulted to assist with management.  Notably, the patient was recently admitted to the hospital from 08/17/2017 through 08/19/2017 for CHF exacerbation.  Her discharge weight was 318 pounds.  Assessment/Plan: Acute on chronic systolic CHF -10/17/2016 echo EF 25%, septal dyssynergy, mildly reduced RV function -NEG 24.4 L since admission -admission weight 347 -Continue intravenous furosemide -Daily weights--NEG 30 lbs -metolazone switched to spironolactone -appreciate cardiology follow up  Acute respiratory failure with hypoxia -Multifactorial including CHF in the setting of OHS/OSA -Wean oxygen for saturation greater than 92% -Presently stable on 2 L  Nonischemic cardiomyopathy -s/p Medtronic BiV ICD placement with upgrade in 05/2017 - LV Lead unable to be placed. - followed by Dr. Ladona Ridgel as an outpatient.   Atrial Tachycardia -s/p ablation -s/p PPM  Diabetes mellitus type 2 -Holding Amaryl -08/18/2017 hemoglobin A1c 7.8 -Continue NovoLog sliding scale -CBGs controlled  Elevated troponin -Secondary to CHF exacerbation -No chest pain -trend is flat  Essential hypertension -Holding enalapril secondary to soft blood pressures -Continue carvedilol for  now  Hyperlipidemia -Continue statin  Hypokalemia/Hypomagnesemia -Replete -Check magnesium--1.6  Morbid obesity -BMI 55.5   Disposition Plan:   Home when cleared by cardiology Family Communication:   Spouse updated at bedside 1/30--Total time spent 35 minutes.  Greater than 50% spent face to face counseling and coordinating care.   Consultants:  cardiology  Code Status:  FULL  DVT Prophylaxis:   [place] Lovenox   Procedures: As Listed in Progress Note Above  Antibiotics: None    Subjective:   Objective: Vitals:   10/18/17 1800 10/18/17 2146 10/19/17 0500 10/19/17 0713  BP: 112/73 109/77  115/75  Pulse: 70 85  71  Resp: 18 20    Temp: 98 F (36.7 C) 98.7 F (37.1 C)  98.4 F (36.9 C)  TempSrc: Oral Oral  Oral  SpO2: 94% 95%  92%  Weight:   (!) 144.2 kg (317 lb 14.5 oz)   Height:        Intake/Output Summary (Last 24 hours) at 10/19/2017 1424 Last data filed at 10/19/2017 0900 Gross per 24 hour  Intake 240 ml  Output 3800 ml  Net -3560 ml   Weight change: -2.6 kg (-11.7 oz) Exam:   General:  Pt is alert, follows commands appropriately, not in acute distress  HEENT: No icterus, No thrush, No neck mass, [place]/AT  Cardiovascular: RRR, S1/S2, no rubs, no gallops  Respiratory: CTA bilaterally, no wheezing, no crackles, no rhonchi  Abdomen: Soft/+BS, non tender, non distended, no guarding  Extremities: No edema, No lymphangitis, No petechiae, No rashes, no synovitis   Data Reviewed: I have personally reviewed following labs and imaging studies Basic Metabolic Panel: Recent Labs  Lab 10/15/17 0642 10/16/17 0621 10/17/17 0619 10/17/17 1534 10/18/17 0618 10/19/17 0504  NA 137 138 137  --  137 140  K 3.4* 2.6* 2.6* 3.0* 2.8* 2.7*  CL 93* 91* 89*  --  88* 88*  CO2 28 31 34*  --  34* 37*  GLUCOSE 114* 104* 131*  --  122* 151*  BUN 30* 24* 21*  --  18 16  CREATININE 1.25* 1.09* 1.04*  --  0.92 0.91  CALCIUM 9.4 9.2 9.2  --  9.0 9.2  MG   --  1.9  --   --   --  1.6*   Liver Function Tests: No results for input(s): AST, ALT, ALKPHOS, BILITOT, PROT, ALBUMIN in the last 168 hours. No results for input(s): LIPASE, AMYLASE in the last 168 hours. No results for input(s): AMMONIA in the last 168 hours. Coagulation Profile: No results for input(s): INR, PROTIME in the last 168 hours. CBC: Recent Labs  Lab 10/15/17 0642 10/18/17 0618  WBC 5.6 4.7  HGB 16.2* 15.1*  HCT 51.5* 47.2*  MCV 92.5 91.3  PLT 161 128*   Cardiac Enzymes: Recent Labs  Lab 10/13/17 1304  TROPONINI 0.05*   BNP: Invalid input(s): POCBNP CBG: Recent Labs  Lab 10/18/17 1135 10/18/17 1708 10/18/17 2152 10/19/17 0828 10/19/17 1224  GLUCAP 173* 143* 198* 143* 149*   HbA1C: No results for input(s): HGBA1C in the last 72 hours. Urine analysis:    Component Value Date/Time   COLORURINE YELLOW 10/14/2017 1750   APPEARANCEUR CLEAR 10/14/2017 1750   LABSPEC 1.010 10/14/2017 1750   PHURINE 7.0 10/14/2017 1750   GLUCOSEU NEGATIVE 10/14/2017 1750   HGBUR NEGATIVE 10/14/2017 1750   BILIRUBINUR NEGATIVE 10/14/2017 1750   KETONESUR NEGATIVE 10/14/2017 1750   PROTEINUR NEGATIVE 10/14/2017 1750   NITRITE NEGATIVE 10/14/2017 1750   LEUKOCYTESUR NEGATIVE 10/14/2017 1750   Sepsis Labs: @LABRCNTIP (procalcitonin:4,lacticidven:4) )No results found for this or any previous visit (from the past 240 hour(s)).   Scheduled Meds: . carvedilol  12.5 mg Oral BID WC  . citalopram  20 mg Oral Daily  . docusate sodium  200 mg Oral QHS  . enoxaparin (LOVENOX) injection  70 mg Subcutaneous Q24H  . furosemide  80 mg Intravenous BID  . insulin aspart  0-15 Units Subcutaneous TID WC  . insulin aspart  0-5 Units Subcutaneous QHS  . [START ON 10/20/2017] magnesium oxide  400 mg Oral Daily  . pantoprazole  40 mg Oral Daily  . potassium chloride  60 mEq Oral TID  . simvastatin  40 mg Oral QPM  . sodium chloride flush  3 mL Intravenous Q12H  . spironolactone  25 mg  Oral Daily  . thyroid  15 mg Oral Daily   Continuous Infusions: . sodium chloride Stopped (10/17/17 1144)    Procedures/Studies: Dg Chest 2 View  Result Date: 10/12/2017 CLINICAL DATA:  Shortness of breath since October 08, 2017. History of CHF, atrial fibrillation, coronary artery disease, nonsmoker. EXAM: CHEST  2 VIEW COMPARISON:  Chest x-ray of August 17, 2017 FINDINGS: The lungs are adequately inflated. The right hemidiaphragm remains higher than the left. The interstitial markings are coarse but fairly stable. The cardiac silhouette is enlarged. The pulmonary vascularity is mildly engorged with mild cephalization noted. The ICD is in stable position. The mediastinum is normal in width. There is no significant pleural effusion. The bony thorax exhibits no acute abnormality. IMPRESSION: CHF with mild interstitial edema. There has not been significant interval change since the previous study. Electronically Signed   By: Raylan Troiani  [place] M.D.   On: 10/12/2017 11:03    Catarina Hartshorn, DO  [facility]  Pager [phone]  If 7PM-7AM, please contact night-coverage [email or web address] Password [email or web address] 10/19/2017, 2:24 PM   LOS: 7 days

## 2017-10-19 NOTE — Progress Notes (Signed)
Critical K+ of 2.7. MD made aware. Awaiting new orders. Will continue to monitor patient.

## 2017-10-19 NOTE — Care Management Note (Signed)
Case Management Note  Patient Details  Name: Donna Graves MRN: [id number] Date of Birth: 1951-04-12      If discussed at Long Length of Stay Meetings, dates discussed:   10/19/2017 Additional Comments:  Isidoro Santillana, [name], RN 10/19/2017, 12:08 PM

## 2017-10-20 LAB — BASIC METABOLIC PANEL
ANION GAP: 16 — AB (ref 5–15)
BUN: 18 mg/dL (ref 6–20)
CHLORIDE: 88 mmol/L — AB (ref 101–111)
CO2: 32 mmol/L (ref 22–32)
Calcium: 9 mg/dL (ref 8.9–10.3)
Creatinine, Ser: 1.01 mg/dL — ABNORMAL HIGH (ref 0.44–1.00)
GFR calc Af Amer: >60 mL/min (ref >60)
GFR calc non Af Amer: 57 mL/min — ABNORMAL LOW (ref >60)
GLUCOSE: 135 mg/dL — AB (ref 65–99)
POTASSIUM: 3.1 mmol/L — AB (ref 3.5–5.1)
Sodium: 136 mmol/L (ref 135–145)

## 2017-10-20 LAB — GLUCOSE, CAPILLARY
GLUCOSE-CAPILLARY: 144 mg/dL — AB (ref 65–99)
GLUCOSE-CAPILLARY: 141 mg/dL — AB (ref 65–99)
GLUCOSE-CAPILLARY: 219 mg/dL — AB (ref 65–99)
Glucose-Capillary: 142 mg/dL — ABNORMAL HIGH (ref 65–99)

## 2017-10-20 LAB — MAGNESIUM: Magnesium: 2.1 mg/dL (ref 1.7–2.4)

## 2017-10-20 MED ORDER — TORSEMIDE 20 MG PO TABS
80.0000 mg | ORAL_TABLET | Freq: Two times a day (BID) | ORAL | Status: DC
  Administered 2017-10-20 – 2017-10-21 (×2): 80 mg via ORAL
  Filled 2017-10-20 (×2): qty 4

## 2017-10-20 NOTE — Care Management (Addendum)
Patient has been recommended for [facility] PT. She is agreeable. Patient lives in [place], [place]. Would like [facility].   Patient will also need a RW. Olegario Messier of [facility] notified and will deliver RW today.  CM will fax HH orders.   [employer] RN, BSN, [employer] [phone]

## 2017-10-20 NOTE — Care Management (Signed)
Patient Information   Patient Name Donna Graves, Donna Graves ([id number]) Sex Female DOB [birth year]  Room Bed  A313 A313-01  Patient Demographics   Address [address] [place] [address] Phone [phone] (Home) [phone] (Mobile) E-mail Address [email or web address]  Patient Ethnicity & Race   Ethnic Group Patient Race  Not Hispanic or Latino White or Caucasian  Emergency Contact(s)   Name Relation Home Work Mobile  Lott Spouse [phone]  [phone]  Documents on File    Status Date Received Description  Documents for the Patient  [place] HIPAA NOTICE OF PRIVACY - Scanned Not Received    [place] E-Signature HIPAA Notice of Privacy Received 10/12/15   Driver's License Not Received    Insurance Card Not Received  [facility] 09/2015  Advance Directives/Living Will/HCPOA/POA Not Received    Other Photo ID Not Received    [place] E-Signature HIPAA Notice of Privacy Spanish     Advanced Beneficiary Notice (ABN) Not Received    E-Signature AOB Spanish Not Received    Insurance Card     HIM ROI Authorization  11/17/15   Insurance Card Received 03/09/17 HUMANA 2018  AMB Correspondence  03/16/17 05/18 SOAP NOTE [facility]  AMB Correspondence  11/29/16 OFFICE VISIT NOTE [facility]  AMB Outside Hospital Record  09/10/13 PACEMAKER IMPLANTATION [facility]  AMB Outside Hospital Record  11/29/16 CARDIOLOGY OFFICE VIST [facility]  AMB Outside Hospital Record  06/02/16 CARDIOLOGY OFFICE VISIT [facility]  Insurance Card Received 07/24/17 new [employer]are 2018/tg  AMB Correspondence Received 08/30/17 [facility]  AMB Correspondence (Deleted) 03/16/17 SOAP NOTE [facility]  Documents for the Encounter  AOB (Assignment of Insurance Benefits) Not Received    E-signature AOB Signed 10/12/17   MEDICARE RIGHTS Not Received    E-signature Medicare Rights Signed  10/12/17   ED Patient Billing Extract   ED PB Billing Extract  Cardiac Monitoring Strip Received 10/12/17   Cardiac Monitoring Strip Shift Summary Received 10/12/17   EKG Received 10/13/17   Admission Information   Attending Provider Admitting Provider Admission Type Admission Date/Time  Tat, Onalee Hua, MD Erick Blinks, MD Emergency 10/12/17 1128  Discharge Date Hospital Service Auth/Cert Status Service Area   Internal Medicine Incomplete [place] SERVICE AREA  Unit Room/Bed Admission Status   AP-DEPT 300 A313/A313-01 Admission (Confirmed)   Admission   Complaint  SOB  Hospital Account   Name Acct ID Class Status Primary Coverage  A[place]ionna, Sholtis [id number] Inpatient Open MEDICARE - MEDICARE PART A AND B      Guarantor Account (for Hospital Account [id number])   Name Relation to Pt Service Area Active? Acct Type  Elesa Massed Self CHSA Yes Personal/Family  Address Phone    [address] [place], [place] [address] [phone](H)        Coverage Information (for Hospital Account [id number])   1. MEDICARE/MEDICARE PART A AND B   F/O Payor/Plan Precert #  MEDICARE/MEDICARE PART A AND B   Subscriber Subscriber #  Syliva, Rosson [id number]  Address Phone  [address] [place], [place] [address]   2. BLUE CROSS BLUE SHIELD/BCBS SUPPLEMENT   F/O Payor/Plan Precert #  BLUE CROSS BLUE SHIELD/BCBS SUPPLEMENT   Subscriber Subscriber #  Sharyon, Janco [id number]  Address Phone  [address] [place], [place] [address]

## 2017-10-20 NOTE — Evaluation (Signed)
Physical Therapy Evaluation Patient Details Name: Donna Graves MRN: [id number] DOB: 1950/11/08 Today's Date: 10/20/2017   History of Present Illness  Donna Graves is a 67 y.o. female with medical history significant of chronic systolic CHF, hypertension and diabetes, presented to the hospital with progressive shortness of breath.  She reports that she noted a significant worsening in her shortness of breath since the past 4 days.  She denies any chest pain.  She she has worsening dyspnea on exertion.  She also describes orthopnea and has had to raise the head of her bed.  She is also noticed worsening edema and increasing weight.  She reports compliance with her medications as well as her fluid/salt intake.  Denies any cough, fever, vomiting, diarrhea  Clinical Impression  Pt received semirecumbent in bed and was agreeable to PT evaluation; husband at bedside. PTA, pt able to ambulate HH distances without AD (approximately 100-217ft), independent with ADLs. At rest in bed, pt's O2 91% on RA; added 2L O2 which instantly improved pt's saturation to high 90s; removed O2 prior to performing STP and pt remained at 94% during transfer and once sitting in chair at rest. Currently, pt mod I for bed mobility, supervision for STS and STP bed > chair with RW. Pt only able to perform sidestepping at EOB x5-6 steps with RW due to increased BLE weakness and jerkiness/shakiness when in standing. Pt stating that this jerkiness is new and started once she was put on potassium. This did improve, however, after multiple sit to stands and bouts of standing. Will continue to follow acutely to address weakness and recommend HHPT and RW upon d/c in order to promote strength, balance, and gait in order to maximize return to PLOF.    Follow Up Recommendations Home health PT;Supervision - Intermittent    Equipment Recommendations  Rolling walker with 5" wheels    Recommendations for Other Services       Precautions  / Restrictions Precautions Precautions: Fall Precaution Comments: increased leg jerkiness with standing/attempts at ambulation; this improved though after multiple STS and did not occur during STP Restrictions Weight Bearing Restrictions: No      Mobility  Bed Mobility Overal bed mobility: Modified Independent                Transfers Overall transfer level: Needs assistance   Transfers: Sit to/from Stand;Stand Pivot Transfers Sit to Stand: Supervision Stand pivot transfers: Supervision          Ambulation/Gait   Ambulation Distance (Feet): 4 Feet Assistive device: Rolling walker (2 wheeled) Gait Pattern/deviations: Decreased stride length;Decreased dorsiflexion - right;Step-to pattern   Gait velocity interpretation: Below normal speed for age/gender General Gait Details: sidestep x5-6 steps at EOB with RW; no further amb attempted this date due to increased leg jerkiness/shakiness when attempting to walk  Stairs            Wheelchair Mobility    Modified Rankin (Stroke Patients Only)       Balance Overall balance assessment: Needs assistance Sitting-balance support: Feet supported Sitting balance-Leahy Scale: Good     Standing balance support: Bilateral upper extremity supported Standing balance-Leahy Scale: Fair Standing balance comment: leg jerkiness/shakiness affecting strength and balance                             Pertinent Vitals/Pain Pain Assessment: No/denies pain    Home Living Family/patient expects to be discharged to:: Private residence Living Arrangements:  Spouse/significant other   Type of Home: House Home Access: Stairs to enter   [employer] of Steps: 1 Home Layout: Laundry or work area in basement;Able to live on main level with bedroom/bathroom Home Equipment: Information systems manager - built in;Walker - 4 wheels;Cane - single point      Prior Function Level of Independence: Independent          Comments: independent with dressing and bathing, amb without AD household distances     Hand Dominance        Extremity/Trunk Assessment   Upper Extremity Assessment Upper Extremity Assessment: Overall WFL for tasks assessed    Lower Extremity Assessment Lower Extremity Assessment: Generalized weakness    Cervical / Trunk Assessment Cervical / Trunk Assessment: Normal  Communication   Communication: No difficulties  Cognition Arousal/Alertness: Lethargic Behavior During Therapy: WFL for tasks assessed/performed Overall Cognitive Status: Within Functional Limits for tasks assessed                                        General Comments      Exercises     Assessment/Plan    PT Assessment Patient needs continued PT services  PT Problem List Decreased strength;Decreased activity tolerance;Decreased balance;Obesity;Cardiopulmonary status limiting activity       PT Treatment Interventions DME instruction;Gait training;Stair training;Functional mobility training;Therapeutic activities;Therapeutic exercise;Balance training;Patient/family education    PT Goals (Current goals can be found in the Care Plan section)  Acute Rehab PT Goals Patient Stated Goal: home PT Goal Formulation: With patient/family Time For Goal Achievement: 10/27/17 Potential to Achieve Goals: Good    Frequency Min 2X/week   Barriers to discharge        Co-evaluation               AM-PAC PT "6 Clicks" Daily Activity  Outcome Measure Difficulty turning over in bed (including adjusting bedclothes, sheets and blankets)?: None Difficulty moving from lying on back to sitting on the side of the bed? : None Difficulty sitting down on and standing up from a chair with arms (e.g., wheelchair, bedside commode, etc,.)?: A Little Help needed moving to and from a bed to chair (including a wheelchair)?: A Little Help needed walking in hospital room?: A Lot Help needed climbing 3-5  steps with a railing? : A Lot 6 Click Score: 18    End of Session Equipment Utilized During Treatment: Gait belt;Oxygen Activity Tolerance: Patient tolerated treatment well Patient left: in chair;with call bell/phone within reach;with family/visitor present Nurse Communication: Mobility status PT Visit Diagnosis: Unsteadiness on feet (R26.81);Muscle weakness (generalized) (M62.81);Difficulty in walking, not elsewhere classified (R26.2)    Time: 4098-1191 PT Time Calculation (min) (ACUTE ONLY): 26 min   Charges:   PT Evaluation $PT Eval Low Complexity: 1 Low PT Treatments $Therapeutic Activity: 8-22 mins   PT G Codes:           Jac Canavan PT, DPT

## 2017-10-20 NOTE — Plan of Care (Signed)
  No Outcome Acute Rehab PT Goals(only PT should resolve) Patient Will Transfer Sit To/From Stand 10/20/2017 1601 by Jerl Santos, PT Flowsheets Taken 10/20/2017 1601  Patient will transfer sit to/from stand with modified independence Pt Will Transfer Bed To Chair/Chair To Bed 10/20/2017 1601 by Jerl Santos, PT Flowsheets Taken 10/20/2017 1601  Pt will Transfer Bed to Chair/Chair to Bed with modified independence Pt Will Ambulate 10/20/2017 1601 by Jerl Santos, PT Flowsheets Taken 10/20/2017 1601  Pt will Ambulate 50 feet;with min guard assist;with supervision;with rolling walker;with least restrictive assistive device     Jac Canavan PT, DPT

## 2017-10-20 NOTE — Care Management (Signed)
Face-to-face encounter (required for Medicare/Medicaid patients) (Order [id number])  Nursing  Date: 10/20/2017 Department: [facility] Ordering/Authorizing: Catarina Hartshorn, MD   Catarina Hartshorn, MD NPI: [id number]    Patient Information   Patient Name Donna Graves, Donna Graves Sex Female DOB 1950/11/16 SSN [id number]  Order Information   Order Date/Time Release Date/Time Start Date/Time End Date/Time  10/20/17 04:16 PM None 10/20/17 04:16 PM 10/20/17 04:16 PM  Order History  Inpatient  Date/Time Action Taken User Additional Information  10/20/17 1616 Sign Catarina Hartshorn, MD   10/20/17 1616 Release Odette Horns, MD (auto-released) Released Order: [id number]  Order Questions   Question Answer Comment  The encounter with the patient was in whole, or in part, for the following medical condition, which is the primary reason for home health care acute on chronic systolic chf; deconditioning   I certify that, based on my findings, the following services are medically necessary home health services Nursing    Physical therapy   Reason for Medically Necessary Home Health Services Therapy- Therapeutic Exercises to Increase Strength and Endurance   My clinical findings support the need for the above services Unable to leave home safely without assistance and/or assistive device   Further, I certify that my clinical findings support that this patient is homebound due to: Unable to leave home safely without assistance       Comments   I Onalee Hua Tat certify that this patient is under my care and that I, or a nurse practitioner or physician's assistant working with me, had a face-to-face encounter that meets the physician face-to-face encounter requirements with this patient on 10/20/2017. The encounter with the patient was in whole, or in part for the following medical condition(s) which is the primary reason for home health care (List medical condition):acute on chronic systolic chf;  deconditioning      Standing Order Information   Remaining Occurrences Interval Last Released     0/1 Once 10/20/2017         Collection Information    Encounter   View Encounter        Panel Detail for Home Health Needs / Face to Face   Panel Medications Disp Refills Start End   Face-to-face encounter (required for Medicare/Medicaid patients)   10/20/2017 10/20/2017   Sig: Once.   Class: Hospital Performed   Home Health   10/20/2017    Sig: After discharge.   Class: Hospital Performed

## 2017-10-20 NOTE — Progress Notes (Signed)
Progress Note  Patient Name: Donna Graves Date of Encounter: 10/20/2017  Primary Cardiologist: Dr. Dina Rich  Subjective   No chest pain or palpitations.  Breathing is improved.  Somewhat weak and fidgety.  Inpatient Medications    Scheduled Meds: . carvedilol  12.5 mg Oral BID WC  . citalopram  20 mg Oral Daily  . docusate sodium  200 mg Oral QHS  . enoxaparin (LOVENOX) injection  70 mg Subcutaneous Q24H  . insulin aspart  0-15 Units Subcutaneous TID WC  . insulin aspart  0-5 Units Subcutaneous QHS  . magnesium oxide  400 mg Oral Daily  . pantoprazole  40 mg Oral Daily  . potassium chloride  60 mEq Oral TID  . simvastatin  40 mg Oral QPM  . sodium chloride flush  3 mL Intravenous Q12H  . spironolactone  25 mg Oral Daily  . thyroid  15 mg Oral Daily  . torsemide  80 mg Oral BID   Continuous Infusions: . sodium chloride Stopped (10/17/17 1144)   PRN Meds: sodium chloride, acetaminophen, dicyclomine, gi cocktail, LORazepam, ondansetron (ZOFRAN) IV, sodium chloride flush   Vital Signs    Vitals:   10/19/17 1448 10/19/17 2058 10/20/17 0555 10/20/17 0825  BP: (!) 134/54 110/67 (!) 100/57 (!) 110/57  Pulse: 80 70 68 68  Resp: 19 18 18    Temp: 98.2 F (36.8 C) 97.8 F (36.6 C)    TempSrc: Oral Oral    SpO2: 94% 91% 91%   Weight:   (!) 313 lb 15 oz (142.4 kg)   Height:        Intake/Output Summary (Last 24 hours) at 10/20/2017 1017 Last data filed at 10/19/2017 2059 Gross per 24 hour  Intake 1350 ml  Output 300 ml  Net 1050 ml   Filed Weights   10/18/17 0543 10/19/17 0500 10/20/17 0555  Weight: (!) 323 lb 10.2 oz (146.8 kg) (!) 317 lb 14.5 oz (144.2 kg) (!) 313 lb 15 oz (142.4 kg)    Physical Exam   GEN:  Morbidly obese.  No acute distress.   Neck: No JVD. Cardiac: RRR, no gallop.  Respiratory: Nonlabored.  Decreased breath sounds. GI:  Obese with pannus, nontender, bowel sounds present. MS:  Improved leg edema; No deformity. Neuro:   Nonfocal. Psych: Alert and oriented x 3. Normal affect.  Labs    Chemistry Recent Labs  Lab 10/18/17 0618 10/19/17 0504 10/20/17 0745  NA 137 140 136  K 2.8* 2.7* 3.1*  CL 88* 88* 88*  CO2 34* 37* 32  GLUCOSE 122* 151* 135*  BUN 18 16 18   CREATININE 0.92 0.91 1.01*  CALCIUM 9.0 9.2 9.0  GFRNONAA >60 >60 57*  GFRAA >60 >60 >60  ANIONGAP 15 15 16*     Hematology Recent Labs  Lab 10/15/17 0642 10/18/17 0618  WBC 5.6 4.7  RBC 5.57* 5.17*  HGB 16.2* 15.1*  HCT 51.5* 47.2*  MCV 92.5 91.3  MCH 29.1 29.2  MCHC 31.5 32.0  RDW 16.6* 18.0*  PLT 161 128*    Cardiac Enzymes Recent Labs  Lab 10/13/17 1304  TROPONINI 0.05*   No results for input(s): TROPIPOC in the last 168 hours.    Radiology    No results found.  Cardiac Studies   Echocardiogram 10/17/2017: Study Conclusions  - Left ventricle: The estimated ejection fraction was 25%. - Ventricular septum: Septal motion showed abnormal function and dyssynergy. - Mitral valve: Mildly calcified annulus. - Right ventricle: The cavity size was  mildly dilated. Systolic function was moderately reduced.  Impressions:  - Limited study with Definity. Images are still quite limited. LVEF estimated at approximately 25% based on the most consistent views. Cannot specifically comment on focal wall motion although there does look to be septal dyssynergy. There is also moderate right ventricular dysfunction.  Patient Profile     67 y.o. 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  Assessment & Plan   1.   Acute on chronic combined heart failure.  Patient has had a substantial weight loss with diuresis during hospital stay.  Very near baseline weight.  2.  Hypokalemia and hypomagnesemia, improving with supplementation.  3.  Nonischemic cardiomyopathy with LVEF 25% and moderate right ventricular dysfunction.  4.  History of atrial tachycardia.  No recurrences recently.  Today we will switch to Demadex 80 mg twice daily, otherwise continue Aldactone, potassium supplements, and magnesium supplements.  Follow-up BMET in a.m.  No change in Coreg or Zocor.  Increase activity as tolerated.  Hopefully home within the next 24-48 hours.  She will need close follow-up with Dr. Wyline Mood.  Signed, Nona Dell, MD  10/20/2017, 10:17 AM

## 2017-10-20 NOTE — Progress Notes (Signed)
PROGRESS NOTE  Donna Graves ZOX:[id number] DOB: Aug 03, 1951 DOA: 10/12/2017 PCP: Wilson Singer, MD  Brief History: 67 year old female with a history of atrialtachycardiastatus post AV nodal ablation, systolic CHF, hypertension, hyperlipidemia, diabetes mellitus, hypothyroidism presented with worsening shortness of breath times 4 days with associated orthopnea and increasing lower extremity edema. Patient also complained of increasing abdominal girth and increasing weight. At the time of admission, the patient was noted to be 20pounds above her last discharge weight. Chest x-ray showed pulmonary edema. The patient was started on intravenous furosemide with good clinical results. Cardiology was consulted to assist with management. Notably, the patient was recently admitted to the hospital from 11/29/2018through 08/19/2017 for CHF exacerbation. Her discharge weight was 318 pounds.  Assessment/Plan: Acute on chronic systolic CHF -10/17/2016 echo EF 25%, septal dyssynergy, mildly reduced RV function -NEG 24 L since admission -admission weight 347 -Continue intravenous furosemide>>>po torsemide on 2/1 -Daily weights--NEG 34 lbs -metolazone switched to spironolactone -appreciate cardiology follow up  Acute respiratory failure with hypoxia -Multifactorial including CHF in the setting of OHS/OSA -Wean oxygen for saturation greater than 92% -Presently stable on 2 L>>>RA  Nonischemic cardiomyopathy -s/p Medtronic BiV ICD placement with upgrade in 05/2017 - LV Lead unable to be placed. - followed by Dr. Ladona Ridgel as an outpatient.  Atrial Tachycardia -s/p ablation -s/p PPM  Diabetes mellitus type 2 -Holding Amaryl -08/18/2017 hemoglobin A1c 7.8 -Continue NovoLog sliding scale -CBGs controlled  Elevated troponin -Secondary to CHF exacerbation -No chest pain -trend is flat  Essential hypertension -Holding enalapril secondary to soft blood  pressures -Continue carvedilol for now  Hyperlipidemia -Continue statin  Hypokalemia/Hypomagnesemia -Repleted  Morbid obesity -BMI 55.5   Disposition Plan: Home 2/2 if stable Family Communication:Spouse updatedat bedside 2/1   Consultants:cardiology  Code Status: FULL  DVT Prophylaxis: [place] Lovenox   Procedures: As Listed in Progress Note Above  Antibiotics: None       Subjective: Patient denies fevers, chills, headache, chest pain, dyspnea, nausea, vomiting, diarrhea, abdominal pain, dysuria, hematuria, hematochezia, and melena.   Objective: Vitals:   10/20/17 0555 10/20/17 0825 10/20/17 0901 10/20/17 1734  BP: (!) 100/57 (!) 110/57  116/62  Pulse: 68 68  70  Resp: 18   18  Temp:    98.6 F (37 C)  TempSrc:    Oral  SpO2: 91%  93% 92%  Weight: (!) 142.4 kg (313 lb 15 oz)     Height:        Intake/Output Summary (Last 24 hours) at 10/20/2017 1744 Last data filed at 10/20/2017 1700 Gross per 24 hour  Intake 720 ml  Output 300 ml  Net 420 ml   Weight change: -1.8 kg (-15.5 oz) Exam:   General:  Pt is alert, follows commands appropriately, not in acute distress  HEENT: No icterus, No thrush, No neck mass, [place]/AT  Cardiovascular: RRR, S1/S2, no rubs, no gallops  Respiratory: CTA bilaterally, no wheezing, no crackles, no rhonchi  Abdomen: Soft/+BS, non tender, non distended, no guarding  Extremities: trace LE edema, No lymphangitis, No petechiae, No rashes, no synovitis   Data Reviewed: I have personally reviewed following labs and imaging studies Basic Metabolic Panel: Recent Labs  Lab 10/16/17 0621 10/17/17 0619 10/17/17 1534 10/18/17 0618 10/19/17 0504 10/20/17 0745  NA 138 137  --  137 140 136  K 2.6* 2.6* 3.0* 2.8* 2.7* 3.1*  CL 91* 89*  --  88* 88* 88*  CO2 31 34*  --  34* 37* 32  GLUCOSE 104* 131*  --  122* 151* 135*  BUN 24* 21*  --  18 16 18   CREATININE 1.09* 1.04*  --  0.92 0.91 1.01*  CALCIUM 9.2 9.2   --  9.0 9.2 9.0  MG 1.9  --   --   --  1.6* 2.1   Liver Function Tests: No results for input(s): AST, ALT, ALKPHOS, BILITOT, PROT, ALBUMIN in the last 168 hours. No results for input(s): LIPASE, AMYLASE in the last 168 hours. No results for input(s): AMMONIA in the last 168 hours. Coagulation Profile: No results for input(s): INR, PROTIME in the last 168 hours. CBC: Recent Labs  Lab 10/15/17 0642 10/18/17 0618  WBC 5.6 4.7  HGB 16.2* 15.1*  HCT 51.5* 47.2*  MCV 92.5 91.3  PLT 161 128*   Cardiac Enzymes: No results for input(s): CKTOTAL, CKMB, CKMBINDEX, TROPONINI in the last 168 hours. BNP: Invalid input(s): POCBNP CBG: Recent Labs  Lab 10/19/17 1608 10/19/17 2049 10/20/17 0715 10/20/17 1225 10/20/17 1732  GLUCAP 151* 139* 142* 144* 141*   HbA1C: No results for input(s): HGBA1C in the last 72 hours. Urine analysis:    Component Value Date/Time   COLORURINE YELLOW 10/14/2017 1750   APPEARANCEUR CLEAR 10/14/2017 1750   LABSPEC 1.010 10/14/2017 1750   PHURINE 7.0 10/14/2017 1750   GLUCOSEU NEGATIVE 10/14/2017 1750   HGBUR NEGATIVE 10/14/2017 1750   BILIRUBINUR NEGATIVE 10/14/2017 1750   KETONESUR NEGATIVE 10/14/2017 1750   PROTEINUR NEGATIVE 10/14/2017 1750   NITRITE NEGATIVE 10/14/2017 1750   LEUKOCYTESUR NEGATIVE 10/14/2017 1750   Sepsis Labs: @LABRCNTIP (procalcitonin:4,lacticidven:4) )No results found for this or any previous visit (from the past 240 hour(s)).   Scheduled Meds: . carvedilol  12.5 mg Oral BID WC  . citalopram  20 mg Oral Daily  . docusate sodium  200 mg Oral QHS  . enoxaparin (LOVENOX) injection  70 mg Subcutaneous Q24H  . insulin aspart  0-15 Units Subcutaneous TID WC  . insulin aspart  0-5 Units Subcutaneous QHS  . magnesium oxide  400 mg Oral Daily  . pantoprazole  40 mg Oral Daily  . potassium chloride  60 mEq Oral TID  . simvastatin  40 mg Oral QPM  . sodium chloride flush  3 mL Intravenous Q12H  . spironolactone  25 mg Oral  Daily  . thyroid  15 mg Oral Daily  . torsemide  80 mg Oral BID   Continuous Infusions: . sodium chloride Stopped (10/17/17 1144)    Procedures/Studies: Dg Chest 2 View  Result Date: 10/12/2017 CLINICAL DATA:  Shortness of breath since October 08, 2017. History of CHF, atrial fibrillation, coronary artery disease, nonsmoker. EXAM: CHEST  2 VIEW COMPARISON:  Chest x-ray of August 17, 2017 FINDINGS: The lungs are adequately inflated. The right hemidiaphragm remains higher than the left. The interstitial markings are coarse but fairly stable. The cardiac silhouette is enlarged. The pulmonary vascularity is mildly engorged with mild cephalization noted. The ICD is in stable position. The mediastinum is normal in width. There is no significant pleural effusion. The bony thorax exhibits no acute abnormality. IMPRESSION: CHF with mild interstitial edema. There has not been significant interval change since the previous study. Electronically Signed   By: Kiearra Oyervides  [place] M.D.   On: 10/12/2017 11:03    Catarina Hartshorn, DO  [facility] Pager [phone]  If 7PM-7AM, please contact night-coverage [email or web address] Password [email or web address] 10/20/2017, 5:44 PM   LOS: 8 days

## 2017-10-20 NOTE — Care Management (Signed)
Home Health (Order [id number])  Nursing  Date: 10/20/2017 Department: [facility] Ordering/Authorizing: Catarina Hartshorn, MD   Catarina Hartshorn, MD NPI: [id number]    Patient Information   Patient Name Donna Graves, Donna Graves Sex Female DOB [birth year] SSN [id number]  Order Information   Order Date/Time Release Date/Time Start Date/Time End Date/Time  10/20/17 04:16 PM None 10/20/17 04:16 PM Until Specified  Order History  Inpatient  Date/Time Action Taken User Additional Information  10/20/17 1616 Sign Tat, Onalee Hua, MD   10/20/17 1616 Release Odette Horns, MD (auto-released) Released Order: [id number]  Order Questions   Question Answer Comment  To provide the following care/treatments PT    RN       Standing Order Information   Remaining Occurrences Interval Last Released     0/1 At discharge 10/20/2017         Collection Information    Encounter   View Encounter        Panel Detail for Home Health Needs / Face to Face   Panel Medications Disp Refills Start End   Home Health   10/20/2017    Sig: After discharge.   Class: Hospital Performed   Face-to-face encounter (required for Medicare/Medicaid patients)   10/20/2017 10/20/2017   Sig: Once.   Class: Hospital Performed

## 2017-10-21 LAB — BASIC METABOLIC PANEL
ANION GAP: 17 — AB (ref 5–15)
BUN: 19 mg/dL (ref 6–20)
CHLORIDE: 91 mmol/L — AB (ref 101–111)
CO2: 32 mmol/L (ref 22–32)
Calcium: 8.9 mg/dL (ref 8.9–10.3)
Creatinine, Ser: 0.97 mg/dL (ref 0.44–1.00)
GFR calc non Af Amer: 60 mL/min — ABNORMAL LOW (ref >60)
Glucose, Bld: 113 mg/dL — ABNORMAL HIGH (ref 65–99)
Potassium: 2.8 mmol/L — ABNORMAL LOW (ref 3.5–5.1)
Sodium: 140 mmol/L (ref 135–145)

## 2017-10-21 LAB — GLUCOSE, CAPILLARY
GLUCOSE-CAPILLARY: 126 mg/dL — AB (ref 65–99)
GLUCOSE-CAPILLARY: 184 mg/dL — AB (ref 65–99)

## 2017-10-21 MED ORDER — TORSEMIDE 20 MG PO TABS
80.0000 mg | ORAL_TABLET | Freq: Two times a day (BID) | ORAL | 0 refills | Status: DC
Start: 2017-10-21 — End: 2018-01-12

## 2017-10-21 MED ORDER — POTASSIUM CHLORIDE CRYS ER 20 MEQ PO TBCR: EXTENDED_RELEASE_TABLET | ORAL | 3 refills | Status: DC

## 2017-10-21 MED ORDER — SPIRONOLACTONE 25 MG PO TABS: 25.0000 mg | ORAL_TABLET | Freq: Every day | ORAL | 0 refills | Status: DC

## 2017-10-21 NOTE — Discharge Summary (Signed)
Physician Discharge Summary  Donna Graves:[id number] DOB: 07/10/1951 DOA: 10/12/2017  PCP: Wilson Singer, MD  Admit date: 10/12/2017 Discharge date: 10/21/2017  Admitted From: Home Disposition:  Home  Recommendations for Outpatient Follow-up:  1. Follow up with PCP in 1-2 weeks 2. Please obtain BMP at cardiology appt on 10/26/17  Home Health: HHPT Equipment/Devices:walker  Discharge Condition: Stable CODE STATUS:FULL Diet recommendation: Heart Healthy / Carb Modified    Brief/Interim Summary: 67 year old female with a history of atrialtachycardiastatus post AV nodal ablation, systolic CHF, hypertension, hyperlipidemia, diabetes mellitus, hypothyroidism presented with worsening shortness of breath times 4 days with associated orthopnea and increasing lower extremity edema. Patient also complained of increasing abdominal girth and increasing weight. At the time of admission, the patient was noted to be 20pounds above her last discharge weight. Chest x-ray showed pulmonary edema. The patient was started on intravenous furosemide with good clinical results. Cardiology was consulted to assist with management. Notably, the patient was recently admitted to the hospital from 11/29/2018through 08/19/2017 for CHF exacerbation. Her discharge weight was 318 pounds on 08/19/17.  The patient was started on intravenous furosemide with good clinical effect.  Cardiology was consulted to assist with management.  Metolazone was added.  Overall, she diuresed 25 L.  Echocardiogram on 10/17/2017 showed EF 25% with moderate decreased RV function.  The patient will be discharged home with torsemide 80 mg twice daily and spironolactone 25 mg daily.  The patient's weight on the day of discharge 10/21/2017 is 312 pounds.    Discharge Diagnoses:  Acute on chronic systolic CHF -10/17/2016 echo EF 25%, septal dyssynergy, mildly reduced RV function -NEG 25L since admission -admission weight  347 -Continue intravenous furosemide>>>po torsemide on 2/1 -Daily weights--NEG 35 lbs -discharge weight 312 lbs -metolazone switched to spironolactone 25 mg daily -appreciate cardiology follow up  Acute respiratory failure with hypoxia -Multifactorial including CHF in the setting of OHS/OSA -Wean oxygen for saturation greater than 92% -Presently stable on 2 L>>>RA  Nonischemic cardiomyopathy -s/p Medtronic BiV ICD placement with upgrade in 05/2017 - LV Lead unable to be placed. - followed by Dr. Ladona Ridgel as an outpatient.  Atrial Tachycardia -s/p ablation -s/p PPM  Diabetes mellitus type 2 -Holding Amaryl -08/18/2017 hemoglobin A1c 7.8 -Continue NovoLog sliding scale -CBGs controlled  Elevated troponin -Secondary to CHF exacerbation -No chest pain -trend is flat  Essential hypertension -Holding enalapril secondary to soft blood pressures -Continue carvedilol for now  Hyperlipidemia -Continue statin  Hypokalemia/Hypomagnesemia -Repleted  Morbid obesity -BMI 55.5     Discharge Instructions   Allergies as of 10/21/2017      Reactions   Codeine Rash      Medication List    STOP taking these medications   enalapril 10 MG tablet Commonly known as:  VASOTEC   metolazone 2.5 MG tablet Commonly known as:  ZAROXOLYN     TAKE these medications   acetaminophen 650 MG CR tablet Commonly known as:  TYLENOL Take 650-1,300 mg by mouth every 8 (eight) hours as needed for pain.   acidophilus Caps capsule Take 1 capsule by mouth daily. ULTIMATE FLORA PROBIOTIC   ALIVE ONCE DAILY WOMENS 50+ PO Take 1 tablet by mouth daily.   ARMOUR THYROID 15 MG tablet Generic drug:  thyroid Take 1 tablet by mouth daily.   BLACK CHERRY CONCENTRATE PO Take 2 tablets by mouth daily.   carvedilol 12.5 MG tablet Commonly known as:  COREG TAKE 12.5 mg  BY MOUTH TWICE DAILY   dicyclomine 10 MG  capsule Commonly known as:  BENTYL Take 10 mg by mouth 3 (three) times  daily as needed for spasms.   escitalopram 5 MG tablet Commonly known as:  LEXAPRO Take 1 tablet by mouth at bedtime.   fluticasone 50 MCG/ACT nasal spray Commonly known as:  FLONASE Place 1-2 sprays into both nostrils daily as needed for allergies.   glimepiride 2 MG tablet Commonly known as:  AMARYL Take 4 mg by mouth daily. BEFORE A MEAL   magnesium oxide 400 MG tablet Commonly known as:  MAG-OX Take 400 mg by mouth daily.   omeprazole 20 MG capsule Commonly known as:  PRILOSEC Take 20 mg by mouth every morning.   polycarbophil 625 MG tablet Commonly known as:  FIBERCON Take 625 mg by mouth 4 (four) times daily.   potassium chloride SA 20 MEQ tablet Commonly known as:  K-DUR,KLOR-CON TAKE 3 TABLETS BY MOUTH EVERY MORNING, 3 tablets with lunch, 3 TABLETS BY MOUTH IN THE EVENING(PM) What changed:  additional instructions   RED YEAST RICE PO Take 2 tablets by mouth daily.   simvastatin 40 MG tablet Commonly known as:  ZOCOR Take 40 mg by mouth every evening.   spironolactone 25 MG tablet Commonly known as:  ALDACTONE Take 1 tablet (25 mg total) by mouth daily. Start taking on:  10/22/2017   torsemide 20 MG tablet Commonly known as:  DEMADEX Take 4 tablets (80 mg total) by mouth 2 (two) times daily. What changed:  how much to take   VITAMIN D PO Take 7,000 Units by mouth daily.            Durable Medical Equipment  (From admission, onward)        Start     Ordered   10/20/17 1608  For home use only DME Walker rolling  Once    Question:  Patient needs a walker to treat with the following condition  Answer:  CHF (congestive heart failure) (HCC)   10/20/17 1607     Follow-up Information    Branch, Dorothe Pea, MD Follow up.   Specialty:  Cardiology Why:  Cardiology Follow-Up on 10/26/2017 at 2:20PM.  Contact information: [address] [place] [address] [phone]          Allergies  Allergen Reactions  . Codeine Rash     Consultations:  cardiology   Procedures/Studies: Dg Chest 2 View  Result Date: 10/12/2017 CLINICAL DATA:  Shortness of breath since October 08, 2017. History of CHF, atrial fibrillation, coronary artery disease, nonsmoker. EXAM: CHEST  2 VIEW COMPARISON:  Chest x-ray of August 17, 2017 FINDINGS: The lungs are adequately inflated. The right hemidiaphragm remains higher than the left. The interstitial markings are coarse but fairly stable. The cardiac silhouette is enlarged. The pulmonary vascularity is mildly engorged with mild cephalization noted. The ICD is in stable position. The mediastinum is normal in width. There is no significant pleural effusion. The bony thorax exhibits no acute abnormality. IMPRESSION: CHF with mild interstitial edema. There has not been significant interval change since the previous study. Electronically Signed   By: Allex Madia  [place] M.D.   On: 10/12/2017 11:03        Discharge Exam: Vitals:   10/20/17 2100 10/21/17 0500  BP: (!) 128/57 (!) 106/42  Pulse: 69 70  Resp: 18 18  Temp: 98.1 F (36.7 C) 98.6 F (37 C)  SpO2: 97% 93%   Vitals:   10/20/17 0901 10/20/17 1734 10/20/17 2100 10/21/17 0500  BP:  116/62 (!) 128/57 (!) 106/42  Pulse:  70 69 70  Resp:  18 18 18   Temp:  98.6 F (37 C) 98.1 F (36.7 C) 98.6 F (37 C)  TempSrc:  Oral Oral Oral  SpO2: 93% 92% 97% 93%  Weight:    (!) 141.8 kg (312 lb 9.6 oz)  Height:        General: Pt is alert, awake, not in acute distress Cardiovascular: RRR, S1/S2 +, no rubs, no gallops Respiratory: CTA bilaterally, no wheezing, no rhonchi Abdominal: Soft, NT, ND, bowel sounds + Extremities: no edema, no cyanosis   The results of significant diagnostics from this hospitalization (including imaging, microbiology, ancillary and laboratory) are listed below for reference.    Significant Diagnostic Studies: Dg Chest 2 View  Result Date: 10/12/2017 CLINICAL DATA:  Shortness of breath since October 08, 2017. History of CHF, atrial fibrillation, coronary artery disease, nonsmoker. EXAM: CHEST  2 VIEW COMPARISON:  Chest x-ray of August 17, 2017 FINDINGS: The lungs are adequately inflated. The right hemidiaphragm remains higher than the left. The interstitial markings are coarse but fairly stable. The cardiac silhouette is enlarged. The pulmonary vascularity is mildly engorged with mild cephalization noted. The ICD is in stable position. The mediastinum is normal in width. There is no significant pleural effusion. The bony thorax exhibits no acute abnormality. IMPRESSION: CHF with mild interstitial edema. There has not been significant interval change since the previous study. Electronically Signed   By: Tarnisha Kachmar  [place] M.D.   On: 10/12/2017 11:03     Microbiology: No results found for this or any previous visit (from the past 240 hour(s)).   Labs: Basic Metabolic Panel: Recent Labs  Lab 10/16/17 0621 10/17/17 4098  10/18/17 0618 10/19/17 0504 10/20/17 0745 10/21/17 0637  NA 138 137  --  137 140 136 140  K 2.6* 2.6*   < > 2.8* 2.7* 3.1* 2.8*  CL 91* 89*  --  88* 88* 88* 91*  CO2 31 34*  --  34* 37* 32 32  GLUCOSE 104* 131*  --  122* 151* 135* 113*  BUN 24* 21*  --  18 16 18 19   CREATININE 1.09* 1.04*  --  0.92 0.91 1.01* 0.97  CALCIUM 9.2 9.2  --  9.0 9.2 9.0 8.9  MG 1.9  --   --   --  1.6* 2.1  --    < > = values in this interval not displayed.   Liver Function Tests: No results for input(s): AST, ALT, ALKPHOS, BILITOT, PROT, ALBUMIN in the last 168 hours. No results for input(s): LIPASE, AMYLASE in the last 168 hours. No results for input(s): AMMONIA in the last 168 hours. CBC: Recent Labs  Lab 10/15/17 0642 10/18/17 0618  WBC 5.6 4.7  HGB 16.2* 15.1*  HCT 51.5* 47.2*  MCV 92.5 91.3  PLT 161 128*   Cardiac Enzymes: No results for input(s): CKTOTAL, CKMB, CKMBINDEX, TROPONINI in the last 168 hours. BNP: Invalid input(s): POCBNP CBG: Recent Labs  Lab 10/20/17 1225  10/20/17 1732 10/20/17 2117 10/21/17 0757 10/21/17 1106  GLUCAP 144* 141* 219* 126* 184*    Time coordinating discharge:  Greater than 30 minutes  Signed:  Catarina Hartshorn, DO [facility] Pager: [phone] 10/21/2017, 12:05 PM

## 2017-10-24 ENCOUNTER — Ambulatory Visit (INDEPENDENT_AMBULATORY_CARE_PROVIDER_SITE_OTHER): Payer: Self-pay

## 2017-10-24 ENCOUNTER — Telehealth: Payer: Self-pay | Admitting: Cardiology

## 2017-10-24 DIAGNOSIS — Z95810 Presence of automatic (implantable) cardiac defibrillator: Secondary | ICD-10-CM

## 2017-10-24 DIAGNOSIS — I5022 Chronic systolic (congestive) heart failure: Secondary | ICD-10-CM

## 2017-10-24 NOTE — Telephone Encounter (Signed)
Spoke with pt and reminded pt of remote transmission that is due today. Pt verbalized understanding.   

## 2017-10-24 NOTE — Progress Notes (Addendum)
EPIC Encounter for ICM Monitoring  Patient Name: Donna Graves is a 67 y.o. female Date: 10/24/2017 Primary Care Physican: Wilson Singer, MD Primary Cardiologist:Branch Electrophysiologist:Taylor Dry Weight:312lbs (weighs daily) at 10/21/2017 discharge Bi-V Pacing:94.6%  Clinical Status (09-Oct-2017 to 24-Oct-2017) Treated VT/VF 0 episodes  AT/AF 168 episodes  Time in AT/AF 13.5 hr/day (56.1%)  Observations (2) (09-Oct-2017 to 24-Oct-2017)  AT/AF >= 6 hr for 9 days.  Patient Activity less than 1 hr/day for 2 weeks.      Attempted call to patient and unable to reach.  Left message to return call.  Transmission reviewed.   Patient hospitalized 10/12/2017 to 10/21/2017 for CHF   Thoracic impedance normal which correlates with being diuresed during recent hospitalization.  Prescribed dosage: Torsemide20 mg take 4 tablets (80 mg total) twice a day. Potassium 20 mEq take 3 tablets three times a day.   Recommendations:  NONE - Unable to reach.  Follow-up plan: ICM clinic phone appointment on 11/09/2017.  Office appointment scheduled 10/26/2017 with Dr. Wyline Mood.  Copy of ICM check sent to Dr. Wyline Mood and Dr. Ladona Ridgel.   3 month ICM trend: 10/24/2017    AT/AF    1 Year ICM trend:       Karie Soda, RN 10/24/2017 3:08 PM

## 2017-10-26 ENCOUNTER — Inpatient Hospital Stay (HOSPITAL_COMMUNITY)
Admission: EM | Admit: 2017-10-26 | Discharge: 2017-10-30 | DRG: 291 | Disposition: A | Discharge status: Home Health | Payer: Medicare Other | Attending: Internal Medicine | Admitting: Internal Medicine

## 2017-10-26 ENCOUNTER — Ambulatory Visit: Payer: Medicare Other | Admitting: Cardiology

## 2017-10-26 ENCOUNTER — Other Ambulatory Visit: Payer: Self-pay

## 2017-10-26 ENCOUNTER — Encounter (HOSPITAL_COMMUNITY): Payer: Self-pay | Admitting: Emergency Medicine

## 2017-10-26 ENCOUNTER — Emergency Department (HOSPITAL_COMMUNITY): Payer: Medicare Other

## 2017-10-26 DIAGNOSIS — Z95810 Presence of automatic (implantable) cardiac defibrillator: Secondary | ICD-10-CM

## 2017-10-26 DIAGNOSIS — N183 Chronic kidney disease, stage 3 (moderate): Secondary | ICD-10-CM | POA: Diagnosis present

## 2017-10-26 DIAGNOSIS — E785 Hyperlipidemia, unspecified: Secondary | ICD-10-CM | POA: Diagnosis present

## 2017-10-26 DIAGNOSIS — I4892 Unspecified atrial flutter: Secondary | ICD-10-CM | POA: Diagnosis present

## 2017-10-26 DIAGNOSIS — E1122 Type 2 diabetes mellitus with diabetic chronic kidney disease: Secondary | ICD-10-CM | POA: Diagnosis present

## 2017-10-26 DIAGNOSIS — I5043 Acute on chronic combined systolic (congestive) and diastolic (congestive) heart failure: Secondary | ICD-10-CM | POA: Diagnosis present

## 2017-10-26 DIAGNOSIS — Z6841 Body Mass Index (BMI) 40.0 and over, adult: Secondary | ICD-10-CM | POA: Diagnosis not present

## 2017-10-26 DIAGNOSIS — M109 Gout, unspecified: Secondary | ICD-10-CM | POA: Diagnosis present

## 2017-10-26 DIAGNOSIS — I10 Essential (primary) hypertension: Secondary | ICD-10-CM | POA: Diagnosis not present

## 2017-10-26 DIAGNOSIS — Z79899 Other long term (current) drug therapy: Secondary | ICD-10-CM | POA: Diagnosis not present

## 2017-10-26 DIAGNOSIS — K219 Gastro-esophageal reflux disease without esophagitis: Secondary | ICD-10-CM | POA: Diagnosis present

## 2017-10-26 DIAGNOSIS — F329 Major depressive disorder, single episode, unspecified: Secondary | ICD-10-CM | POA: Diagnosis present

## 2017-10-26 DIAGNOSIS — I480 Paroxysmal atrial fibrillation: Secondary | ICD-10-CM | POA: Diagnosis present

## 2017-10-26 DIAGNOSIS — I130 Hypertensive heart and chronic kidney disease with heart failure and stage 1 through stage 4 chronic kidney disease, or unspecified chronic kidney disease: Principal | ICD-10-CM | POA: Diagnosis present

## 2017-10-26 DIAGNOSIS — E118 Type 2 diabetes mellitus with unspecified complications: Secondary | ICD-10-CM

## 2017-10-26 DIAGNOSIS — E039 Hypothyroidism, unspecified: Secondary | ICD-10-CM | POA: Diagnosis present

## 2017-10-26 DIAGNOSIS — I428 Other cardiomyopathies: Secondary | ICD-10-CM | POA: Diagnosis present

## 2017-10-26 DIAGNOSIS — R7989 Other specified abnormal findings of blood chemistry: Secondary | ICD-10-CM | POA: Diagnosis present

## 2017-10-26 DIAGNOSIS — M793 Panniculitis, unspecified: Secondary | ICD-10-CM | POA: Diagnosis present

## 2017-10-26 DIAGNOSIS — R601 Generalized edema: Secondary | ICD-10-CM

## 2017-10-26 DIAGNOSIS — Z7984 Long term (current) use of oral hypoglycemic drugs: Secondary | ICD-10-CM

## 2017-10-26 DIAGNOSIS — I5023 Acute on chronic systolic (congestive) heart failure: Secondary | ICD-10-CM | POA: Diagnosis not present

## 2017-10-26 DIAGNOSIS — I509 Heart failure, unspecified: Secondary | ICD-10-CM

## 2017-10-26 DIAGNOSIS — R748 Abnormal levels of other serum enzymes: Secondary | ICD-10-CM | POA: Diagnosis not present

## 2017-10-26 DIAGNOSIS — E119 Type 2 diabetes mellitus without complications: Secondary | ICD-10-CM

## 2017-10-26 DIAGNOSIS — R778 Other specified abnormalities of plasma proteins: Secondary | ICD-10-CM | POA: Diagnosis present

## 2017-10-26 LAB — COMPREHENSIVE METABOLIC PANEL
ALK PHOS: 109 U/L (ref 38–126)
ALT: 23 U/L (ref 14–54)
ANION GAP: 14 (ref 5–15)
AST: 83 U/L — ABNORMAL HIGH (ref 15–41)
Albumin: 3.1 g/dL — ABNORMAL LOW (ref 3.5–5.0)
BILIRUBIN TOTAL: 2.4 mg/dL — AB (ref 0.3–1.2)
BUN: 27 mg/dL — ABNORMAL HIGH (ref 6–20)
CO2: 24 mmol/L (ref 22–32)
Calcium: 9.4 mg/dL (ref 8.9–10.3)
Chloride: 96 mmol/L — ABNORMAL LOW (ref 101–111)
Creatinine, Ser: 1.16 mg/dL — ABNORMAL HIGH (ref 0.44–1.00)
GFR calc non Af Amer: 48 mL/min — ABNORMAL LOW (ref >60)
GFR, EST AFRICAN AMERICAN: 56 mL/min — AB (ref >60)
Glucose, Bld: 155 mg/dL — ABNORMAL HIGH (ref 65–99)
Potassium: 3.2 mmol/L — ABNORMAL LOW (ref 3.5–5.1)
Sodium: 134 mmol/L — ABNORMAL LOW (ref 135–145)
TOTAL PROTEIN: 6.4 g/dL — AB (ref 6.5–8.1)

## 2017-10-26 LAB — URINALYSIS, ROUTINE W REFLEX MICROSCOPIC
BILIRUBIN URINE: NEGATIVE
Glucose, UA: NEGATIVE mg/dL
Hgb urine dipstick: NEGATIVE
Ketones, ur: NEGATIVE mg/dL
Leukocytes, UA: NEGATIVE
NITRITE: NEGATIVE
Protein, ur: NEGATIVE mg/dL
SPECIFIC GRAVITY, URINE: 1.006 (ref 1.005–1.030)
pH: 5 (ref 5.0–8.0)

## 2017-10-26 LAB — CBC WITH DIFFERENTIAL/PLATELET
BASOS ABS: 0 10*3/uL (ref 0.0–0.1)
BASOS PCT: 0 %
Eosinophils Absolute: 0.1 10*3/uL (ref 0.0–0.7)
Eosinophils Relative: 1 %
HEMATOCRIT: 50.5 % — AB (ref 36.0–46.0)
HEMOGLOBIN: 16 g/dL — AB (ref 12.0–15.0)
Lymphocytes Relative: 20 %
Lymphs Abs: 1.3 10*3/uL (ref 0.7–4.0)
MCH: 29.7 pg (ref 26.0–34.0)
MCHC: 31.7 g/dL (ref 30.0–36.0)
MCV: 93.7 fL (ref 78.0–100.0)
Monocytes Absolute: 0.7 10*3/uL (ref 0.1–1.0)
Monocytes Relative: 10 %
NEUTROS ABS: 4.5 10*3/uL (ref 1.7–7.7)
NEUTROS PCT: 69 %
Platelets: 159 10*3/uL (ref 150–400)
RBC: 5.39 MIL/uL — ABNORMAL HIGH (ref 3.87–5.11)
RDW: 17.4 % — AB (ref 11.5–15.5)
WBC: 6.6 10*3/uL (ref 4.0–10.5)

## 2017-10-26 LAB — GLUCOSE, CAPILLARY: GLUCOSE-CAPILLARY: 118 mg/dL — AB (ref 65–99)

## 2017-10-26 LAB — TROPONIN I
TROPONIN I: 0.04 ng/mL — AB (ref <0.03)
Troponin I: 0.05 ng/mL

## 2017-10-26 LAB — BRAIN NATRIURETIC PEPTIDE: B Natriuretic Peptide: 395 pg/mL — ABNORMAL HIGH (ref 0.0–100.0)

## 2017-10-26 MED ORDER — SODIUM CHLORIDE 0.9% FLUSH: 3.0000 mL | INTRAVENOUS | Status: DC | PRN

## 2017-10-26 MED ORDER — VANCOMYCIN HCL 10 G IV SOLR
1500.0000 mg | INTRAVENOUS | Status: DC
  Administered 2017-10-27 – 2017-10-29 (×3): 1500 mg via INTRAVENOUS
  Filled 2017-10-26 (×4): qty 1500

## 2017-10-26 MED ORDER — ASPIRIN EC 81 MG PO TBEC
81.0000 mg | DELAYED_RELEASE_TABLET | Freq: Every day | ORAL | Status: DC
  Administered 2017-10-26 – 2017-10-30 (×5): 81 mg via ORAL
  Filled 2017-10-26 (×5): qty 1

## 2017-10-26 MED ORDER — DICYCLOMINE HCL 10 MG PO CAPS: 10.0000 mg | ORAL_CAPSULE | Freq: Three times a day (TID) | ORAL | Status: DC | PRN

## 2017-10-26 MED ORDER — SODIUM CHLORIDE 0.9% FLUSH
3.0000 mL | Freq: Two times a day (BID) | INTRAVENOUS | Status: DC
  Administered 2017-10-27 – 2017-10-30 (×8): 3 mL via INTRAVENOUS

## 2017-10-26 MED ORDER — SIMVASTATIN 20 MG PO TABS
40.0000 mg | ORAL_TABLET | Freq: Every evening | ORAL | Status: DC
  Administered 2017-10-26 – 2017-10-29 (×4): 40 mg via ORAL
  Filled 2017-10-26 (×4): qty 2

## 2017-10-26 MED ORDER — RISAQUAD PO CAPS
1.0000 | ORAL_CAPSULE | Freq: Every day | ORAL | Status: DC
  Administered 2017-10-27 – 2017-10-30 (×4): 1 via ORAL
  Filled 2017-10-26 (×4): qty 1

## 2017-10-26 MED ORDER — CARVEDILOL 12.5 MG PO TABS
12.5000 mg | ORAL_TABLET | Freq: Two times a day (BID) | ORAL | Status: DC
  Administered 2017-10-26 – 2017-10-30 (×6): 12.5 mg via ORAL
  Filled 2017-10-26 (×9): qty 1

## 2017-10-26 MED ORDER — MAGNESIUM OXIDE 400 (241.3 MG) MG PO TABS
400.0000 mg | ORAL_TABLET | Freq: Every day | ORAL | Status: DC
  Administered 2017-10-27 – 2017-10-30 (×4): 400 mg via ORAL
  Filled 2017-10-26 (×4): qty 1

## 2017-10-26 MED ORDER — ONDANSETRON HCL 4 MG/2ML IJ SOLN
4.0000 mg | Freq: Four times a day (QID) | INTRAMUSCULAR | Status: DC | PRN
  Administered 2017-10-28: 4 mg via INTRAVENOUS
  Filled 2017-10-26: qty 2

## 2017-10-26 MED ORDER — VANCOMYCIN HCL 10 G IV SOLR
2000.0000 mg | Freq: Once | INTRAVENOUS | Status: AC
  Administered 2017-10-26: 2000 mg via INTRAVENOUS
  Filled 2017-10-26 (×2): qty 2000

## 2017-10-26 MED ORDER — FUROSEMIDE 10 MG/ML IJ SOLN
80.0000 mg | Freq: Once | INTRAMUSCULAR | Status: AC
  Administered 2017-10-26: 80 mg via INTRAVENOUS
  Filled 2017-10-26: qty 8

## 2017-10-26 MED ORDER — SODIUM CHLORIDE 0.9 % IV SOLN: 250.0000 mL | INTRAVENOUS | Status: DC | PRN

## 2017-10-26 MED ORDER — FUROSEMIDE 10 MG/ML IJ SOLN: 80.0000 mg | Freq: Two times a day (BID) | INTRAMUSCULAR | Status: DC

## 2017-10-26 MED ORDER — ENOXAPARIN SODIUM 80 MG/0.8ML ~~LOC~~ SOLN
70.0000 mg | SUBCUTANEOUS | Status: DC
  Administered 2017-10-26 – 2017-10-29 (×4): 70 mg via SUBCUTANEOUS
  Filled 2017-10-26 (×4): qty 0.8

## 2017-10-26 MED ORDER — NYSTATIN 100000 UNIT/GM EX POWD
Freq: Two times a day (BID) | CUTANEOUS | Status: DC
  Administered 2017-10-26 – 2017-10-30 (×8): via TOPICAL
  Filled 2017-10-26: qty 15

## 2017-10-26 MED ORDER — FLUTICASONE PROPIONATE 50 MCG/ACT NA SUSP: 1.0000 | Freq: Every day | NASAL | Status: DC | PRN

## 2017-10-26 MED ORDER — GLIMEPIRIDE 2 MG PO TABS
4.0000 mg | ORAL_TABLET | Freq: Every day | ORAL | Status: DC
  Administered 2017-10-27 – 2017-10-30 (×4): 4 mg via ORAL
  Filled 2017-10-26 (×4): qty 2
  Filled 2017-10-26: qty 1

## 2017-10-26 MED ORDER — CALCIUM POLYCARBOPHIL 625 MG PO TABS
625.0000 mg | ORAL_TABLET | Freq: Four times a day (QID) | ORAL | Status: DC
  Administered 2017-10-26 – 2017-10-30 (×14): 625 mg via ORAL
  Filled 2017-10-26 (×24): qty 1

## 2017-10-26 MED ORDER — FUROSEMIDE 10 MG/ML IJ SOLN
40.0000 mg | Freq: Once | INTRAMUSCULAR | Status: DC
  Filled 2017-10-26: qty 4

## 2017-10-26 MED ORDER — ACETAMINOPHEN 325 MG PO TABS
650.0000 mg | ORAL_TABLET | ORAL | Status: DC | PRN
  Administered 2017-10-27 – 2017-10-29 (×3): 650 mg via ORAL
  Filled 2017-10-26 (×3): qty 2

## 2017-10-26 MED ORDER — THYROID 30 MG PO TABS
15.0000 mg | ORAL_TABLET | Freq: Every day | ORAL | Status: DC
  Administered 2017-10-27 – 2017-10-30 (×4): 15 mg via ORAL
  Filled 2017-10-26 (×6): qty 1

## 2017-10-26 MED ORDER — SPIRONOLACTONE 25 MG PO TABS
25.0000 mg | ORAL_TABLET | Freq: Every day | ORAL | Status: DC
  Administered 2017-10-27 – 2017-10-30 (×4): 25 mg via ORAL
  Filled 2017-10-26 (×4): qty 1

## 2017-10-26 MED ORDER — ENOXAPARIN SODIUM 40 MG/0.4ML ~~LOC~~ SOLN: 40.0000 mg | SUBCUTANEOUS | Status: DC

## 2017-10-26 MED ORDER — INSULIN ASPART 100 UNIT/ML ~~LOC~~ SOLN
0.0000 [IU] | Freq: Three times a day (TID) | SUBCUTANEOUS | Status: DC
  Administered 2017-10-27 (×2): 1 [IU] via SUBCUTANEOUS
  Administered 2017-10-28: 3 [IU] via SUBCUTANEOUS
  Administered 2017-10-28: 1 [IU] via SUBCUTANEOUS
  Administered 2017-10-29 – 2017-10-30 (×3): 2 [IU] via SUBCUTANEOUS

## 2017-10-26 MED ORDER — POTASSIUM CHLORIDE CRYS ER 20 MEQ PO TBCR
20.0000 meq | EXTENDED_RELEASE_TABLET | Freq: Every day | ORAL | Status: DC
  Administered 2017-10-26 – 2017-10-30 (×5): 20 meq via ORAL
  Filled 2017-10-26 (×5): qty 1

## 2017-10-26 MED ORDER — POTASSIUM CHLORIDE CRYS ER 20 MEQ PO TBCR
40.0000 meq | EXTENDED_RELEASE_TABLET | Freq: Once | ORAL | Status: AC
  Administered 2017-10-26: 40 meq via ORAL
  Filled 2017-10-26: qty 2

## 2017-10-26 MED ORDER — SODIUM CHLORIDE 0.9 % IV SOLN: INTRAVENOUS | Status: DC

## 2017-10-26 NOTE — Progress Notes (Signed)
Upon to arrival to floor pts VSS. During skin assessment RN noticed MASD to abdomen and groin. Pt also stated she is diabetic. No orders for blood sugars or insulin. Dr. Onalee Hua paged and made aware. RN also asked Dr. Onalee Hua to order Nystatin powder for pt. Waiting for order/call back. Pt also stated that she fell at home last night as well as a couple days ago. Purewick placed d/t pt feeling week, receiving Lasix and being high fall risk. RN spoke with Dr. Onalee Hua, new order for achs blood sugars, mild as well as Nystatin Powder. Will continue to monitor pt

## 2017-10-26 NOTE — ED Notes (Signed)
CRITICAL VALUE ALERT  Critical Value:  Troponin 0.04  Date & Time Notied: 1805  Provider Notified [facility]  Orders Received/Actions taken: none

## 2017-10-26 NOTE — ED Notes (Signed)
Sandwich given 

## 2017-10-26 NOTE — H&P (Signed)
History and Physical    Donna Graves ZOX:[id number] DOB: [birth year] DOA: 10/26/2017  PCP: Wilson Singer, MD  Patient coming from: Home  Chief Complaint: Swelling all over  HPI: Donna Graves is a 67 y.o. female with medical history significant of chronic congestive heart failure systolic dysfunction with EF of 25%, morbid obesity with a discharge weight of 312 pounds on October 21, 2017, nonischemic cardia myopathy with AICD, diabetes, hypertension comes in with swelling again to her legs into her pannus and redness to her right lower quadrant pannus and feeling like it is hot.  She states she went home on Saturday on June 2 and was doing very well had hardly any swelling.  Since that time she is starting to swell again.  She is reports she is compliant with all of her medications and she is taking every single dose.  She also noted on Sunday she started having redness spread on her stomach where she was previously getting shots to her abdomen.  The area has gotten redder and more painful and feels hot.  She denies any chills.  She denies any nausea or vomiting.  Patient found to be in congestive heart failure and referred for admission for CHF exacerbation.  No report of cellulitis to her abdomen no antibiotics given yet.   Review of Systems: As per HPI otherwise 10 point review of systems negative.   Past Medical History:  Diagnosis Date  . Atrial fibrillation and flutter (HCC)    History of RFA and ultimately AV node ablation  . Cardiomyopathy (HCC)    a. LVEF 25-30% by echo in 04/2017.  Marland Kitchen CHF (congestive heart failure) (HCC)   . Chronic systolic heart failure (HCC)   . Depression   . GERD (gastroesophageal reflux disease)   . Gout   . History of cardiac catheterization 07/23/2013   RA 17; RV 43/20 PCWP 27.  LAD normal  LCx  norma  RCA normal.  LVEF 55%  . Hyperlipidemia   . Hypertension   . Hypothyroidism   . 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 Ladona Ridgel  . Type 2 diabetes mellitus (HCC)     Past Surgical History:  Procedure Laterality Date  . ANAL FISSURE REPAIR    . BACK SURGERY    . BIV UPGRADE N/A 05/23/2017   Procedure: BiVI Upgrade;  Surgeon: Marinus Maw, MD;  Location: [facility] INVASIVE CV LAB;  Service: Cardiovascular;  Laterality: N/A;  . ICD IMPLANT  05/23/2017   biv  . Left toe surgery       reports that  has never smoked. she has never used smokeless tobacco. She reports that she does not drink alcohol or use drugs.  Allergies  Allergen Reactions  . Codeine Rash    Family History  Problem Relation Age of Onset  . Diabetes Mother 40  . Heart attack Mother   . Hypertension Mother   . Diabetes Brother 78  . Arthritis Father 77    Prior to Admission medications   Medication Sig Start Date End Date Taking? Authorizing Provider  acetaminophen (TYLENOL) 650 MG CR tablet Take 650-1,300 mg by mouth every 8 (eight) hours as needed for pain.   Yes [provider]  acidophilus (RISAQUAD) CAPS capsule Take 1 capsule by mouth daily. ULTIMATE FLORA PROBIOTIC   Yes [provider]  ARMOUR THYROID 15 MG tablet Take 1 tablet by mouth daily. 10/05/17  Yes [provider]  carvedilol (COREG) 12.5 MG tablet TAKE 12.5 mg  BY MOUTH TWICE DAILY 03/01/17  Yes [provider]  Cholecalciferol (VITAMIN D PO) Take 7,000 Units by mouth daily.   Yes [provider]  dicyclomine (BENTYL) 10 MG capsule Take 10 mg by mouth 3 (three) times daily as needed for spasms.   Yes [provider]  escitalopram (LEXAPRO) 5 MG tablet Take 1 tablet by mouth at bedtime. 07/24/17  Yes [provider]  glimepiride (AMARYL) 2 MG tablet Take 4 mg by mouth daily. BEFORE A MEAL   Yes [provider]  magnesium oxide (MAG-OX) 400 MG tablet Take 400 mg by mouth daily.   Yes [provider]  Misc Natural Products (BLACK CHERRY  CONCENTRATE PO) Take 2 tablets by mouth daily.    Yes [provider]  Multiple Vitamins-Minerals (ALIVE ONCE DAILY WOMENS 50+ PO) Take 1 tablet by mouth daily.   Yes [provider]  omeprazole (PRILOSEC) 20 MG capsule Take 20 mg by mouth every morning.   Yes [provider]  polycarbophil (FIBERCON) 625 MG tablet Take 625 mg by mouth 4 (four) times daily.   Yes [provider]  Red Yeast Rice Extract (RED YEAST RICE PO) Take 2 tablets by mouth daily.    Yes [provider]  simvastatin (ZOCOR) 40 MG tablet Take 40 mg by mouth every evening.    Yes [provider]  spironolactone (ALDACTONE) 25 MG tablet Take 1 tablet (25 mg total) by mouth daily. 10/22/17  Yes Tat, Onalee Hua, MD  torsemide (DEMADEX) 20 MG tablet Take 4 tablets (80 mg total) by mouth 2 (two) times daily. 10/21/17  Yes Tat, Onalee Hua, MD  fluticasone [facility]) 50 MCG/ACT nasal spray Place 1-2 sprays into both nostrils daily as needed for allergies. 07/17/17   [provider]  potassium chloride SA (K-DUR,KLOR-CON) 20 MEQ tablet TAKE 3 TABLETS BY MOUTH EVERY MORNING, 3 tablets with lunch, 3 TABLETS BY MOUTH IN THE EVENING(PM) Patient not taking: Reported on 10/26/2017 10/21/17   Catarina Hartshorn, MD    Physical Exam: Vitals:   10/26/17 1930 10/26/17 2000 10/26/17 2030 10/26/17 2051  BP: 100/60 109/76 113/71 118/74  Pulse: 71  62 71  Resp: (!) 25 18  18   Temp:    98.4 F (36.9 C)  TempSrc:    Oral  SpO2: 95%  92% 95%  Weight:    (!) 140.5 kg (309 lb 11.9 oz)  Height:    5\' 4"  (1.626 m)      Constitutional: NAD, calm, comfortable Vitals:   10/26/17 1930 10/26/17 2000 10/26/17 2030 10/26/17 2051  BP: 100/60 109/76 113/71 118/74  Pulse: 71  62 71  Resp: (!) 25 18  18   Temp:    98.4 F (36.9 C)  TempSrc:    Oral  SpO2: 95%  92% 95%  Weight:    (!) 140.5 kg (309 lb 11.9 oz)  Height:    5\' 4"  (1.626 m)   Eyes: PERRL, lids and conjunctivae normal ENMT: Mucous membranes are  moist. Posterior pharynx clear of any exudate or lesions.Normal dentition.  Neck: normal, supple, no masses, no thyromegaly Respiratory: clear to auscultation bilaterally, no wheezing, no crackles. Normal respiratory effort. No accessory muscle use.  Cardiovascular: Regular rate and rhythm, no murmurs / rubs / gallops.  2+ extremity edema. 2+ pedal pulses. No carotid bruits.  Abdomen: no tenderness, no masses palpated. No hepatosplenomegaly. Bowel sounds positive.  Musculoskeletal: no clubbing / cyanosis.  No joint deformity upper and lower extremities. Good ROM, no contractures. Normal muscle tone.  Skin: Erythematous rash to the right lower quadrant of her pannus that almost extends to half of her abdomen.  Consistent with cellulitis there is no purulent drainage anywhere no fluctuance anywhere but there is induration around old bruising from previous injection site  neurologic: CN 2-12 grossly intact. Sensation intact, DTR normal. Strength 5/5 in all 4.  Psychiatric: Normal judgment and insight. Alert and oriented x 3. Normal mood.    Labs on Admission: I have personally reviewed following labs and imaging studies  CBC: Recent Labs  Lab 10/26/17 1611  WBC 6.6  NEUTROABS 4.5  HGB 16.0*  HCT 50.5*  MCV 93.7  PLT 159   Basic Metabolic Panel: Recent Labs  Lab 10/20/17 0745 10/21/17 0637 10/26/17 1611  NA 136 140 134*  K 3.1* 2.8* 3.2*  CL 88* 91* 96*  CO2 32 32 24  GLUCOSE 135* 113* 155*  BUN 18 19 27*  CREATININE 1.01* 0.97 1.16*  CALCIUM 9.0 8.9 9.4  MG 2.1  --   --    GFR: Estimated Creatinine Clearance: 67.9 mL/min (A) (by C-G formula based on SCr of 1.16 mg/dL (H)). Liver Function Tests: Recent Labs  Lab 10/26/17 1611  AST 83*  ALT 23  ALKPHOS 109  BILITOT 2.4*  PROT 6.4*  ALBUMIN 3.1*   No results for input(s): LIPASE, AMYLASE in the last 168 hours. No results for input(s): AMMONIA in the last 168 hours. Coagulation Profile: No results for input(s): INR,  PROTIME in the last 168 hours. Cardiac Enzymes: Recent Labs  Lab 10/26/17 1611  TROPONINI 0.04*   BNP (last 3 results) No results for input(s): PROBNP in the last 8760 hours. HbA1C: No results for input(s): HGBA1C in the last 72 hours. CBG: Recent Labs  Lab 10/20/17 1225 10/20/17 1732 10/20/17 2117 10/21/17 0757 10/21/17 1106  GLUCAP 144* 141* 219* 126* 184*   Lipid Profile: No results for input(s): CHOL, HDL, LDLCALC, TRIG, CHOLHDL, LDLDIRECT in the last 72 hours. Thyroid Function Tests: No results for input(s): TSH, T4TOTAL, FREET4, T3FREE, THYROIDAB in the last 72 hours. Anemia Panel: No results for input(s): VITAMINB12, FOLATE, FERRITIN, TIBC, IRON, RETICCTPCT in the last 72 hours. Urine analysis:    Component Value Date/Time   COLORURINE STRAW (A) 10/26/2017 1551   APPEARANCEUR CLEAR 10/26/2017 1551   LABSPEC 1.006 10/26/2017 1551   PHURINE 5.0 10/26/2017 1551   GLUCOSEU NEGATIVE 10/26/2017 1551   HGBUR NEGATIVE 10/26/2017 1551   BILIRUBINUR NEGATIVE 10/26/2017 1551   KETONESUR NEGATIVE 10/26/2017 1551   PROTEINUR NEGATIVE 10/26/2017 1551   NITRITE NEGATIVE 10/26/2017 1551   LEUKOCYTESUR NEGATIVE 10/26/2017 1551   Sepsis Labs: !!!!!!!!!!!!!!!!!!!!!!!!!!!!!!!!!!!!!!!!!!!! @LABRCNTIP (procalcitonin:4,lacticidven:4) )No results found for this or any previous visit (from the past 240 hour(s)).   Radiological Exams on Admission: Dg Chest 2 View  Result Date: 10/26/2017 CLINICAL DATA:  Shortness of breath EXAM: CHEST  2 VIEW COMPARISON:  10/12/2017 FINDINGS: Chronic cardiomegaly. Dual-chamber ICD/pacer from the left. Chronic interstitial coarsening. No Kerley lines or effusion. No air bronchogram. IMPRESSION: 1. No acute finding when compared to prior. 2. Cardiomegaly. Electronically Signed   By: Marnee Spring M.D.   On: 10/26/2017 16:38    EKG: Independently reviewed.  Ventricular paced Old chart reviewed Case discussed with EDP Chest x-ray reviewed no edema or  infiltrate   Assessment/Plan 67 year old female comes in with acute on chronic congestive heart failure with panniculitis  Principal Problem:   Acute  on chronic congestive heart failure (HCC)-continue her Zaroxolyn.  We will not repeat echo.  Placed on Lasix 80 mg IV every 12 hours.  Her weight today is 309 pounds which is actually less than it was last week.  She does have peripheral edema though dependent edema in her pannus.  Placed on CHF pathway.  Daily weights and accurate I's and O's.  Active Problems:   Panniculitis-placed on IV vancomycin.   Type II diabetes mellitus (HCC)-continue glimepiride   AF (paroxysmal atrial fibrillation) (HCC)-status post ablation   Essential hypertension-continue enalapril and Coreg   Elevated troponin-repeat one now but mildly elevated same as last week   Morbid obesity (HCC)-stable    DVT prophylaxis: SCDs and Lovenox Code Status: Full Family Communication: Husband Disposition Plan: Per day team Consults called: None Admission status: Admission   Cherika Jessie A MD [facility]  If 7PM-7AM, please contact night-coverage [email or web address] Password [facility]  10/26/2017, 9:39 PM

## 2017-10-26 NOTE — Progress Notes (Deleted)
Clinical Summary Ms. Loncar is a 67 y.o.female seen today for follow up of the following medical problems.  1. Chronic systolic HF -From clinic notes originally followed in [place]. Apparently had normal cath in early 2000s, LVEF around that time was 30-35%. - 07/2013 [facility]: CI 2, mean PA 27, PCWP 27, nonobstructive CAD - - BiV upgrade 05/2017, from notes unable to place LV lead. - 04/2017 echo LVEF 25-30%  - admit 06/2017 with acute on chronic systolic HF. Negative 33L per discharge summary, weight 359 on admit to 313 on discharge.  - has had some issues with medication compliance, particulary her diuretics  - home weights around 325 lbs. Has had some abdominal distension, LE edema. Worsening SOB/DOE.  - compliant with diuretics, lasix 160mg  bid and metolazone 2.5mg  on Friday  - home range between 318-325 lbs range - compliant with diuretics. Swelling is up and down. Breathing has improved.   - recent discharge 08/19/17 weight 318 lbs.  - since discharge no recurrent edema, weights stable at home   - admit Jan 2019 with CHF - Jan 2019 echo LVEF 25%. MOderate RV dysfunction - neg 25L per charting, weight 347 to 313 lbs - discharged on torsemide 80mg  bid.     2. Atrial tachycardia - from prior cardiology notes s/p ablation - from prior hospital notes history was unclear and thought possibly to have been an aflutter ablation  - she denies any recent palpitations   3. Syncope - admission 07/2017. Device check was normal.  - no recurrent symptoms since discharge.    Past Medical History:  Diagnosis Date  . Atrial fibrillation and flutter (HCC)    History of RFA and ultimately AV node ablation  . Cardiomyopathy (HCC)    a. LVEF 25-30% by echo in 04/2017.  Marland Kitchen Chronic systolic heart failure (HCC)   . Depression   . GERD (gastroesophageal reflux disease)   . Gout   . History of cardiac catheterization 07/23/2013   RA 17; RV 43/20 PCWP 27.   LAD normal  LCx  norma  RCA normal.  LVEF 55%  . Hyperlipidemia   . Hypertension   . Hypothyroidism   . 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 Ladona Ridgel  . Type 2 diabetes mellitus (HCC)      Allergies  Allergen Reactions  . Codeine Rash     Current Outpatient Medications  Medication Sig Dispense Refill  . acetaminophen (TYLENOL) 650 MG CR tablet Take 650-1,300 mg by mouth every 8 (eight) hours as needed for pain.    Marland Kitchen acidophilus (RISAQUAD) CAPS capsule Take 1 capsule by mouth daily. ULTIMATE FLORA PROBIOTIC    . ARMOUR THYROID 15 MG tablet Take 1 tablet by mouth daily.  2  . carvedilol (COREG) 12.5 MG tablet TAKE 12.5 mg  BY MOUTH TWICE DAILY  1  . Cholecalciferol (VITAMIN D PO) Take 7,000 Units by mouth daily.    Marland Kitchen dicyclomine (BENTYL) 10 MG capsule Take 10 mg by mouth 3 (three) times daily as needed for spasms.    Marland Kitchen escitalopram (LEXAPRO) 5 MG tablet Take 1 tablet by mouth at bedtime.  2  . fluticasone (FLONASE) 50 MCG/ACT nasal spray Place 1-2 sprays into both nostrils daily as needed for allergies.  0  . glimepiride (AMARYL) 2 MG tablet Take 4 mg by mouth daily. BEFORE A MEAL    . magnesium oxide (MAG-OX) 400 MG tablet Take 400 mg by  mouth daily.    . Misc Natural Products (BLACK CHERRY CONCENTRATE PO) Take 2 tablets by mouth daily.     . Multiple Vitamins-Minerals (ALIVE ONCE DAILY WOMENS 50+ PO) Take 1 tablet by mouth daily.    Marland Kitchen omeprazole (PRILOSEC) 20 MG capsule Take 20 mg by mouth every morning.    . polycarbophil (FIBERCON) 625 MG tablet Take 625 mg by mouth 4 (four) times daily.    . potassium chloride SA (K-DUR,KLOR-CON) 20 MEQ tablet TAKE 3 TABLETS BY MOUTH EVERY MORNING, 3 tablets with lunch, 3 TABLETS BY MOUTH IN THE EVENING(PM) 180 tablet 3  . Red Yeast Rice Extract (RED YEAST RICE PO) Take 2 tablets by mouth daily.     . simvastatin (ZOCOR) 40 MG tablet Take 40 mg by mouth every evening.       Marland Kitchen spironolactone (ALDACTONE) 25 MG tablet Take 1 tablet (25 mg total) by mouth daily. 30 tablet 0  . torsemide (DEMADEX) 20 MG tablet Take 4 tablets (80 mg total) by mouth 2 (two) times daily. 240 tablet 0   No current facility-administered medications for this visit.      Past Surgical History:  Procedure Laterality Date  . ANAL FISSURE REPAIR    . BACK SURGERY    . BIV UPGRADE N/A 05/23/2017   Procedure: BiVI Upgrade;  Surgeon: Marinus Maw, MD;  Location[facility] INVASIVE CV LAB;  Service: Cardiovascular;  Laterality: N/A;  . ICD IMPLANT  05/23/2017   biv  . Left toe surgery       Allergies  Allergen Reactions  . Codeine Rash      Family History  Problem Relation Age of Onset  . Diabetes Mother 33  . Heart attack Mother   . Hypertension Mother   . Diabetes Brother 65  . Arthritis Father 107     Social History Ms. Mcginniss reports that  has never smoked. she has never used smokeless tobacco. Ms. Stjulien reports that she does not drink alcohol.   Review of Systems CONSTITUTIONAL: No weight loss, fever, chills, weakness or fatigue.  HEENT: Eyes: No visual loss, blurred vision, double vision or yellow sclerae.No hearing loss, sneezing, congestion, runny nose or sore throat.  SKIN: No rash or itching.  CARDIOVASCULAR:  RESPIRATORY: No shortness of breath, cough or sputum.  GASTROINTESTINAL: No anorexia, nausea, vomiting or diarrhea. No abdominal pain or blood.  GENITOURINARY: No burning on urination, no polyuria NEUROLOGICAL: No headache, dizziness, syncope, paralysis, ataxia, numbness or tingling in the extremities. No change in bowel or bladder control.  MUSCULOSKELETAL: No muscle, back pain, joint pain or stiffness.  LYMPHATICS: No enlarged nodes. No history of splenectomy.  PSYCHIATRIC: No history of depression or anxiety.  ENDOCRINOLOGIC: No reports of sweating, cold or heat intolerance. No polyuria or polydipsia.  Marland Kitchen   Physical Examination There were no vitals  filed for this visit. There were no vitals filed for this visit.  Gen: resting comfortably, no acute distress HEENT: no scleral icterus, pupils equal round and reactive, no palptable cervical adenopathy,  CV Resp: Clear to auscultation bilaterally GI: abdomen is soft, non-tender, non-distended, normal bowel sounds, no hepatosplenomegaly MSK: extremities are warm, no edema.  Skin: warm, no rash Neuro:  no focal deficits Psych: appropriate affect   Diagnostic Studies 02/2017 echo: LVEF 25-30%, anteroseptal hypokinesis, mild RV dysfunction, PASP 17 Oct 2017 echo  Study Conclusions  - Left ventricle: The estimated ejection fraction was 25%. - Ventricular septum: Septal motion showed abnormal function and  dyssynergy. - Mitral valve: Mildly calcified annulus. - Right ventricle: The cavity size was mildly dilated. Systolic   function was moderately reduced.    Assessment and Plan  1. Chronic systolic HF -appears euvolemic by exam - continue current diuretics, repeat BMET/Mg  2. Atrial tachycardia - asymptomatic, continue current meds  3. Syncope - no evidence of cardiac etiolgy during recent admission, continue to monitor.    F/u 6 weeks      Antoine Poche, M.D., F.A.C.C.

## 2017-10-26 NOTE — ED Triage Notes (Signed)
Patient c/o edema to legs bilaterally and abd. Per patient just released from hospital on Saturday after being admitted for CHF. Per patient was placed diuretics. Denies any shortness of breath or chest pain. Per patient generalized fatigue.

## 2017-10-26 NOTE — ED Notes (Signed)
Patient ambulatory to restroom  ?

## 2017-10-26 NOTE — ED Provider Notes (Signed)
[facility] EMERGENCY DEPARTMENT Provider Note   CSN: [id number] Arrival date & time: 10/26/17  1551     History   Chief Complaint Chief Complaint  Patient presents with  . Leg Swelling    HPI Donna Graves is a 67 y.o. female.  Patient complains of weakness and swelling in legs and abdomen.  Patient has a history of congestive heart failure.   The history is provided by the patient. No language interpreter was used.  Weakness  Primary symptoms include no focal weakness. This is a recurrent problem. The current episode started more than 2 days ago. The problem has not changed since onset.There was no focality noted. There has been no fever. Associated symptoms include shortness of breath. Pertinent negatives include no chest pain and no headaches. There were no medications administered prior to arrival.    Past Medical History:  Diagnosis Date  . Atrial fibrillation and flutter (HCC)    History of RFA and ultimately AV node ablation  . Cardiomyopathy (HCC)    a. LVEF 25-30% by echo in 04/2017.  Marland Kitchen CHF (congestive heart failure) (HCC)   . Chronic systolic heart failure (HCC)   . Depression   . GERD (gastroesophageal reflux disease)   . Gout   . History of cardiac catheterization 07/23/2013   RA 17; RV 43/20 PCWP 27.  LAD normal  LCx  norma  RCA normal.  LVEF 55%  . Hyperlipidemia   . Hypertension   . Hypothyroidism   . 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 Ladona Ridgel  . Type 2 diabetes mellitus [facility])     Patient Active Problem List   Diagnosis Date Noted  . CHF exacerbation (HCC) 10/26/2017  . Acute respiratory failure with hypoxia (HCC) 10/18/2017  . Atrial tachycardia (HCC) 10/18/2017  . Morbid obesity with BMI of 50.0-59.9, adult (HCC) 10/18/2017  . Morbid obesity (HCC) 10/12/2017  . Acute on chronic congestive heart failure (HCC) 08/17/2017  . Chronic systolic heart failure (HCC)  40/98/1191  . Atypical chest pain   . Elevated troponin   . Acute on chronic systolic CHF (congestive heart failure) (HCC) 03/30/2017  . Near syncope 03/30/2017  . Type II diabetes mellitus (HCC) 03/30/2017  . AF (paroxysmal atrial fibrillation) (HCC) 03/30/2017  . Essential hypertension 03/30/2017  . CAD (coronary artery disease) 03/30/2017    Past Surgical History:  Procedure Laterality Date  . ANAL FISSURE REPAIR    . BACK SURGERY    . BIV UPGRADE N/A 05/23/2017   Procedure: BiVI Upgrade;  Surgeon: Marinus Maw, MD;  Location: [facility] INVASIVE CV LAB;  Service: Cardiovascular;  Laterality: N/A;  . ICD IMPLANT  05/23/2017   biv  . Left toe surgery      OB History    Gravida Para Term Preterm AB Living             1   SAB TAB Ectopic Multiple Live Births                   Home Medications    Prior to Admission medications   Medication Sig Start Date End Date Taking? Authorizing Provider  acetaminophen (TYLENOL) 650 MG CR tablet Take 650-1,300 mg by mouth every 8 (eight) hours as needed for pain.   Yes [provider]  acidophilus (RISAQUAD) CAPS capsule Take 1 capsule by mouth daily. ULTIMATE FLORA PROBIOTIC   Yes [provider]  ARMOUR THYROID 15 MG tablet  Take 1 tablet by mouth daily. 10/05/17  Yes [provider]  carvedilol (COREG) 12.5 MG tablet TAKE 12.5 mg  BY MOUTH TWICE DAILY 03/01/17  Yes [provider]  Cholecalciferol (VITAMIN D PO) Take 7,000 Units by mouth daily.   Yes [provider]  dicyclomine (BENTYL) 10 MG capsule Take 10 mg by mouth 3 (three) times daily as needed for spasms.   Yes [provider]  escitalopram (LEXAPRO) 5 MG tablet Take 1 tablet by mouth at bedtime. 07/24/17  Yes [provider]  glimepiride (AMARYL) 2 MG tablet Take 4 mg by mouth daily. BEFORE A MEAL   Yes [provider]  magnesium oxide (MAG-OX) 400 MG tablet Take 400 mg by mouth daily.   Yes [provider]  Misc Natural Products (BLACK CHERRY CONCENTRATE PO) Take 2 tablets by mouth daily.    Yes [provider]  Multiple Vitamins-Minerals (ALIVE ONCE DAILY WOMENS 50+ PO) Take 1 tablet by mouth daily.   Yes [provider]  omeprazole (PRILOSEC) 20 MG capsule Take 20 mg by mouth every morning.   Yes [provider]  polycarbophil (FIBERCON) 625 MG tablet Take 625 mg by mouth 4 (four) times daily.   Yes [provider]  Red Yeast Rice Extract (RED YEAST RICE PO) Take 2 tablets by mouth daily.    Yes [provider]  simvastatin (ZOCOR) 40 MG tablet Take 40 mg by mouth every evening.    Yes [provider]  spironolactone (ALDACTONE) 25 MG tablet Take 1 tablet (25 mg total) by mouth daily. 10/22/17  Yes Tat, Onalee Hua, MD  torsemide (DEMADEX) 20 MG tablet Take 4 tablets (80 mg total) by mouth 2 (two) times daily. 10/21/17  Yes Tat, Onalee Hua, MD  fluticasone [facility]) 50 MCG/ACT nasal spray Place 1-2 sprays into both nostrils daily as needed for allergies. 07/17/17   [provider]  potassium chloride SA (K-DUR,KLOR-CON) 20 MEQ tablet TAKE 3 TABLETS BY MOUTH EVERY MORNING, 3 tablets with lunch, 3 TABLETS BY MOUTH IN THE EVENING(PM) Patient not taking: Reported on 10/26/2017 10/21/17   Catarina Hartshorn, MD    Family History Family History  Problem Relation Age of Onset  . Diabetes Mother 1  . Heart attack Mother   . Hypertension Mother   . Diabetes Brother 68  . Arthritis Father 31    Social History Social History   Tobacco Use  . Smoking status: Never Smoker  . Smokeless tobacco: Never Used  Substance Use Topics  . Alcohol use: No    Alcohol/week: 0.0 oz  . Drug use: No     Allergies   Codeine   Review of Systems Review of Systems  Constitutional: Negative for appetite change and fatigue.  HENT: Negative for congestion, ear discharge and sinus pressure.   Eyes: Negative for discharge.  Respiratory: Positive for shortness of  breath. Negative for cough.   Cardiovascular: Negative for chest pain.  Gastrointestinal: Negative for abdominal pain and diarrhea.  Genitourinary: Negative for frequency and hematuria.  Musculoskeletal: Negative for back pain.       Swelling in legs  Skin: Negative for rash.  Neurological: Positive for weakness. Negative for focal weakness, seizures and headaches.  Psychiatric/Behavioral: Negative for hallucinations.     Physical Exam Updated Vital Signs BP (!) 104/52   Pulse 70   Temp 98.5 F (36.9 C) (Oral)   Resp (!) 24   Ht 5\' 4"  (1.626 m)   Wt (!) 141.5 kg (312  lb)   SpO2 92%   BMI 53.55 kg/m   Physical Exam  Constitutional: She is oriented to person, place, and time. She appears well-developed.  HENT:  Head: Normocephalic.  Eyes: Conjunctivae and EOM are normal. No scleral icterus.  Neck: Neck supple. No thyromegaly present.  Cardiovascular: Intact distal pulses. Exam reveals no gallop and no friction rub.  No murmur heard. Pulmonary/Chest: No stridor. She has no wheezes. She has no rales. She exhibits no tenderness.  Abdominal: She exhibits no distension. There is no tenderness. There is no rebound.  Swelling in abdomen  Musculoskeletal: Normal range of motion. She exhibits no edema.  3+ edema of the legs  Lymphadenopathy:    She has no cervical adenopathy.  Neurological: She is oriented to person, place, and time. She exhibits normal muscle tone. Coordination normal.  Skin: No rash noted. No erythema.  Psychiatric: She has a normal mood and affect. Her behavior is normal.     ED Treatments / Results  Labs (all labs ordered are listed, but only abnormal results are displayed) Labs Reviewed  COMPREHENSIVE METABOLIC PANEL - Abnormal; Notable for the following components:      Result Value   Sodium 134 (*)    Potassium 3.2 (*)    Chloride 96 (*)    Glucose, Bld 155 (*)    BUN 27 (*)    Creatinine, Ser 1.16 (*)    Total Protein 6.4 (*)    Albumin 3.1 (*)     AST 83 (*)    Total Bilirubin 2.4 (*)    GFR calc non Af Amer 48 (*)    GFR calc Af Amer 56 (*)    All other components within normal limits  CBC WITH DIFFERENTIAL/PLATELET - Abnormal; Notable for the following components:   RBC 5.39 (*)    Hemoglobin 16.0 (*)    HCT 50.5 (*)    RDW 17.4 (*)    All other components within normal limits  BRAIN NATRIURETIC PEPTIDE - Abnormal; Notable for the following components:   B Natriuretic Peptide 395.0 (*)    All other components within normal limits  URINALYSIS, ROUTINE W REFLEX MICROSCOPIC - Abnormal; Notable for the following components:   Color, Urine STRAW (*)    All other components within normal limits  TROPONIN I - Abnormal; Notable for the following components:   Troponin I 0.04 (*)    All other components within normal limits    EKG  EKG Interpretation  Date/Time:  Thursday October 26 2017 16:03:00 EST Ventricular Rate:  75 PR Interval:    QRS Duration: 166 QT Interval:  452 QTC Calculation: 504 R Axis:   -39 Text Interpretation:  Ventricular-paced rhythm Abnormal ECG since last tracing no significant change Confirmed by Mancel Bale [phone]) on 10/26/2017 4:41:04 PM       Radiology Dg Chest 2 View  Result Date: 10/26/2017 CLINICAL DATA:  Shortness of breath EXAM: CHEST  2 VIEW COMPARISON:  10/12/2017 FINDINGS: Chronic cardiomegaly. Dual-chamber ICD/pacer from the left. Chronic interstitial coarsening. No Kerley lines or effusion. No air bronchogram. IMPRESSION: 1. No acute finding when compared to prior. 2. Cardiomegaly. Electronically Signed   By: Marnee Spring M.D.   On: 10/26/2017 16:38    Procedures Procedures (including critical care time)  Medications Ordered in ED Medications  0.9 %  sodium chloride infusion ( Intravenous Not Given 10/26/17 1735)  furosemide (LASIX) injection 80 mg (80 mg Intravenous Given 10/26/17 1735)  potassium chloride SA (K-DUR,KLOR-CON) CR tablet  40 mEq (40 mEq Oral Given 10/26/17 1849)       Initial Impression / Assessment and Plan / ED Course  I have reviewed the triage vital signs and the nursing notes.  Pertinent labs & imaging results that were available during my care of the patient were reviewed by me and considered in my medical decision making (see chart for details).     Patient with congestive heart failure and anasarca she will be admitted to medicine for diuresis  Final Clinical Impressions(s) / ED Diagnoses   Final diagnoses:  Anasarca    ED Discharge Orders    None       Bethann Berkshire, MD 10/26/17 [phone]

## 2017-10-26 NOTE — Progress Notes (Addendum)
Pharmacy Antibiotic Note  Donna Graves is a 67 y.o. female admitted on 10/26/2017 with cellulitis.  Pharmacy has been consulted for Vancomycin dosing.  Plan: Vancomycin 2gm IV x 1 Vancomycin 1500mg  IV every 24 hours.  Goal trough 10-15 mcg/mL.  Monitor labs, micro and vitals.   Height: 5\' 4"  (162.6 cm) Weight: (!) 309 lb 11.9 oz (140.5 kg) IBW/kg (Calculated) : 54.7  Temp (24hrs), Avg:98.5 F (36.9 C), Min:98.4 F (36.9 C), Max:98.5 F (36.9 C)  Recent Labs  Lab 10/20/17 0745 10/21/17 0637 10/26/17 1611  WBC  --   --  6.6  CREATININE 1.01* 0.97 1.16*    Estimated Creatinine Clearance: 67.9 mL/min (A) (by C-G formula based on SCr of 1.16 mg/dL (H)).    Allergies  Allergen Reactions  . Codeine Rash    Antimicrobials this admission: Vanc 2/7 >>   Dose adjustments this admission: Obesity/Normalized CrCl Vancomycin dosing protocol will be initiated with an estimated normalized CrCl = 55 ml/min.   Microbiology results:  BCx:   UCx:    Sputum:    MRSA PCR:   Thank you for allowing pharmacy to be a part of this patient's care.  Mady Gemma 10/26/2017 9:49 PM

## 2017-10-27 ENCOUNTER — Encounter: Payer: Self-pay | Admitting: Cardiology

## 2017-10-27 DIAGNOSIS — I5023 Acute on chronic systolic (congestive) heart failure: Secondary | ICD-10-CM

## 2017-10-27 DIAGNOSIS — I10 Essential (primary) hypertension: Secondary | ICD-10-CM

## 2017-10-27 DIAGNOSIS — R748 Abnormal levels of other serum enzymes: Secondary | ICD-10-CM

## 2017-10-27 LAB — BASIC METABOLIC PANEL
ANION GAP: 10 (ref 5–15)
BUN: 26 mg/dL — ABNORMAL HIGH (ref 6–20)
CALCIUM: 8.7 mg/dL — AB (ref 8.9–10.3)
CO2: 28 mmol/L (ref 22–32)
Chloride: 98 mmol/L — ABNORMAL LOW (ref 101–111)
Creatinine, Ser: 1.2 mg/dL — ABNORMAL HIGH (ref 0.44–1.00)
GFR, EST AFRICAN AMERICAN: 54 mL/min — AB (ref >60)
GFR, EST NON AFRICAN AMERICAN: 46 mL/min — AB (ref >60)
Glucose, Bld: 85 mg/dL (ref 65–99)
POTASSIUM: 3.4 mmol/L — AB (ref 3.5–5.1)
Sodium: 136 mmol/L (ref 135–145)

## 2017-10-27 LAB — GLUCOSE, CAPILLARY
GLUCOSE-CAPILLARY: 82 mg/dL (ref 65–99)
GLUCOSE-CAPILLARY: 147 mg/dL — AB (ref 65–99)
GLUCOSE-CAPILLARY: 136 mg/dL — AB (ref 65–99)
GLUCOSE-CAPILLARY: 185 mg/dL — AB (ref 65–99)

## 2017-10-27 MED ORDER — FUROSEMIDE 10 MG/ML IJ SOLN
40.0000 mg | Freq: Two times a day (BID) | INTRAMUSCULAR | Status: DC
  Administered 2017-10-27 – 2017-10-29 (×5): 40 mg via INTRAVENOUS
  Filled 2017-10-27 (×6): qty 4

## 2017-10-27 MED ORDER — FUROSEMIDE 10 MG/ML IJ SOLN: 40.0000 mg | Freq: Two times a day (BID) | INTRAMUSCULAR | Status: DC

## 2017-10-27 NOTE — Progress Notes (Signed)
Pts BP 89/49 manually, BP in the 70s automatic. Pt did receive Coreg around 11pm last night for standing medication. Dr. Sharl Ma paged and made aware. Waiting for orders/call back. Will continue to monitor pt

## 2017-10-27 NOTE — Progress Notes (Signed)
BP recheck-BP 103/57. Dr Sharl Ma paged to see if he wants Lasix to be given d/t there not being any parameters. Waiting for orders/call back. Will continue to monitor pt

## 2017-10-27 NOTE — Progress Notes (Signed)
PROGRESS NOTE    Donna Graves  ZOX:[id number] DOB: May 01, 1951 DOA: 10/26/2017 PCP: Wilson Singer, MD     Brief Narrative:  67 y/o woman admitted from home on 2/7 with complaints of anasarca as well as redness to her RLQ (she has a large pannus). States this redness is at the spot where she had been receiving blood-thining shots during her prior hospitalization. Admission was requested.   Assessment & Plan:   Principal Problem:   Acute on chronic congestive heart failure (HCC) Active Problems:   Type II diabetes mellitus (HCC)   AF (paroxysmal atrial fibrillation) (HCC)   Essential hypertension   Elevated troponin   Morbid obesity (HCC)   Panniculitis   Acute on Chronic Combined CHF -ECHO from 01/19 reviewed: EF 25%, technically limited study due to body habitus, cannot comment on wall motion. Moderate RV dysfunction. -She is 1.3 L negative since admission. -Still significantly hypervolemic. Continue to strive for negative fluid balance. -Would continue current dose of IV lasix of 40 mg IV BID. -Continue coreg, spironolactone. -Not on ACE-I/ARB due to renal dysfunction.  Panniculitis -Continue vancomycin.  Acute on CKD Stage III -Baseline Cr appears to be around 0.9-1.0 with a GFR of around 55-60. -Cr is currently above baseline at 1.20. -Monitor with diuresis.  Elevated Troponin -Flat, no CP. -EKG without acute changes. -Recent ECHO from 09/2017 with reduced EF. -No plans for further cardiac work up at present.  Type II DM -Fair control. -Continue current regimen.  Morbid Obesity -Noted.  Essential Hypertension -Well controlled. -Continue current regimen.   DVT prophylaxis: Lovenox Code Status: Full Code Family Communication: Husband at bedside updated on plan of care and all questions answered Disposition Plan: Obtain PT eval due to frequent hospitalizations. Patient admits to feeling very weak.  Consultants:   None  Procedures:    None  Antimicrobials:  Anti-infectives (From admission, onward)   Start     Dose/Rate Route Frequency Ordered Stop   10/27/17 2200  vancomycin (VANCOCIN) 1,500 mg in sodium chloride 0.9 % 500 mL IVPB     1,500 mg 250 mL/hr over 120 Minutes Intravenous Every 24 hours 10/26/17 2155     10/26/17 2200  vancomycin (VANCOCIN) 2,000 mg in sodium chloride 0.9 % 500 mL IVPB     2,000 mg 250 mL/hr over 120 Minutes Intravenous  Once 10/26/17 2155 10/27/17 0154       Subjective: Sitting in bed eating dinner. No CP/SOB. Does complain of abdominal wall pain at site of panniculitis.  Objective: Vitals:   10/27/17 0756 10/27/17 1700 10/27/17 1900 10/27/17 2100  BP: 115/62 110/62  (!) 108/59  Pulse: 70 70  70  Resp:    17  Temp:  98.1 F (36.7 C)  97.8 F (36.6 C)  TempSrc:  Oral  Oral  SpO2:  93%  93%  Weight:   (!) 140.5 kg (309 lb 11.9 oz)   Height:        Intake/Output Summary (Last 24 hours) at 10/27/2017 2149 Last data filed at 10/27/2017 1929 Gross per 24 hour  Intake 530 ml  Output 1600 ml  Net -1070 ml   Filed Weights   10/26/17 2051 10/27/17 0500 10/27/17 1900  Weight: (!) 140.5 kg (309 lb 11.9 oz) (!) 140.7 kg (310 lb 3 oz) (!) 140.5 kg (309 lb 11.9 oz)    Examination:  General exam: Alert, awake, oriented x 3, morbidly obese. Respiratory system: Clear to auscultation. Respiratory effort normal. Cardiovascular system:RRR. No murmurs, rubs,  gallops. Gastrointestinal system: Abdomen is nondistended, soft and nontender. No organomegaly or masses felt. Normal bowel sounds heard. Significant edema and erythema to right lower part of pannus. No signs of topical fungal infection. Central nervous system: Alert and oriented. No focal neurological deficits. Extremities: 2+ pitting edema bilaterally, +pedal pulses Psychiatry: Judgement and insight appear normal. Mood & affect appropriate.     Data Reviewed: I have personally reviewed following labs and imaging  studies  CBC: Recent Labs  Lab 10/26/17 1611  WBC 6.6  NEUTROABS 4.5  HGB 16.0*  HCT 50.5*  MCV 93.7  PLT 159   Basic Metabolic Panel: Recent Labs  Lab 10/21/17 0637 10/26/17 1611 10/27/17 0638  NA 140 134* 136  K 2.8* 3.2* 3.4*  CL 91* 96* 98*  CO2 32 24 28  GLUCOSE 113* 155* 85  BUN 19 27* 26*  CREATININE 0.97 1.16* 1.20*  CALCIUM 8.9 9.4 8.7*   GFR: Estimated Creatinine Clearance: 65.7 mL/min (A) (by C-G formula based on SCr of 1.2 mg/dL (H)). Liver Function Tests: Recent Labs  Lab 10/26/17 1611  AST 83*  ALT 23  ALKPHOS 109  BILITOT 2.4*  PROT 6.4*  ALBUMIN 3.1*   No results for input(s): LIPASE, AMYLASE in the last 168 hours. No results for input(s): AMMONIA in the last 168 hours. Coagulation Profile: No results for input(s): INR, PROTIME in the last 168 hours. Cardiac Enzymes: Recent Labs  Lab 10/26/17 1611 10/26/17 2105  TROPONINI 0.04* 0.05*   BNP (last 3 results) No results for input(s): PROBNP in the last 8760 hours. HbA1C: No results for input(s): HGBA1C in the last 72 hours. CBG: Recent Labs  Lab 10/26/17 2218 10/27/17 0823 10/27/17 1207 10/27/17 1711 10/27/17 2147  GLUCAP 118* 82 147* 136* 185*   Lipid Profile: No results for input(s): CHOL, HDL, LDLCALC, TRIG, CHOLHDL, LDLDIRECT in the last 72 hours. Thyroid Function Tests: No results for input(s): TSH, T4TOTAL, FREET4, T3FREE, THYROIDAB in the last 72 hours. Anemia Panel: No results for input(s): VITAMINB12, FOLATE, FERRITIN, TIBC, IRON, RETICCTPCT in the last 72 hours. Urine analysis:    Component Value Date/Time   COLORURINE STRAW (A) 10/26/2017 1551   APPEARANCEUR CLEAR 10/26/2017 1551   LABSPEC 1.006 10/26/2017 1551   PHURINE 5.0 10/26/2017 1551   GLUCOSEU NEGATIVE 10/26/2017 1551   HGBUR NEGATIVE 10/26/2017 1551   BILIRUBINUR NEGATIVE 10/26/2017 1551   KETONESUR NEGATIVE 10/26/2017 1551   PROTEINUR NEGATIVE 10/26/2017 1551   NITRITE NEGATIVE 10/26/2017 1551    LEUKOCYTESUR NEGATIVE 10/26/2017 1551   Sepsis Labs: @LABRCNTIP (procalcitonin:4,lacticidven:4)  )No results found for this or any previous visit (from the past 240 hour(s)).       Radiology Studies: Dg Chest 2 View  Result Date: 10/26/2017 CLINICAL DATA:  Shortness of breath EXAM: CHEST  2 VIEW COMPARISON:  10/12/2017 FINDINGS: Chronic cardiomegaly. Dual-chamber ICD/pacer from the left. Chronic interstitial coarsening. No Kerley lines or effusion. No air bronchogram. IMPRESSION: 1. No acute finding when compared to prior. 2. Cardiomegaly. Electronically Signed   By: Marnee Spring M.D.   On: 10/26/2017 16:38        Scheduled Meds: . acidophilus  1 capsule Oral Daily  . aspirin EC  81 mg Oral Daily  . carvedilol  12.5 mg Oral BID WC  . enoxaparin (LOVENOX) injection  70 mg Subcutaneous Q24H  . furosemide  40 mg Intravenous Q12H  . glimepiride  4 mg Oral QAC breakfast  . insulin aspart  0-9 Units Subcutaneous TID WC  . magnesium  oxide  400 mg Oral Daily  . nystatin   Topical BID  . polycarbophil  625 mg Oral QID  . potassium chloride SA  20 mEq Oral Daily  . simvastatin  40 mg Oral QPM  . sodium chloride flush  3 mL Intravenous Q12H  . spironolactone  25 mg Oral Daily  . thyroid  15 mg Oral Daily   Continuous Infusions: . sodium chloride    . vancomycin Stopped (10/27/17 2312)     LOS: 1 day    Time spent: 25 minutes. Greater than 50% of this time was spent in direct contact with the patient coordinating care.     Chaya Jan, MD [facility] Pager [phone]  If 7PM-7AM, please contact night-coverage [email or web address] Password [facility] 10/27/2017, 9:49 PM

## 2017-10-27 NOTE — Progress Notes (Signed)
After paging Dr. Sharl Ma about BP of 103/57 he called and stated he was going to d/c the Lasix 80mg  and change it to 40mg  IV and to give the first dose now. Waiting for pharmacy to verify dose and will give.

## 2017-10-27 NOTE — Progress Notes (Signed)
After speaking with Dr Madelaine Etienne stated to just observe pt right now. If BP is still low by 0600 hold her Lasix. Will continue to monitor pt

## 2017-10-27 NOTE — Care Management Note (Signed)
Case Management Note  Patient Details  Name: Donna Graves MRN: [id number] Date of Birth: [birth year]  Subjective/Objective:    Adm with CHF/ Cellulitis. Recent admission for CHF. DC'd Feb 2nd. Sent home with Home Health provided by [facility]. She had two HH visits. But due to her weakness, she has been arranged to potentially go to [facility] by her PCP. Husband reports that depending on her improvement here, will be deciding factor if they will want HH vs SNF at time of DC.                 Action/Plan:CM following. CSW aware of [facility] potential.    Expected Discharge Date:    unk              Expected Discharge Plan:     In-House Referral:     Discharge planning Services  CM Consult  Post Acute Care Choice:    Choice offered to:     DME Arranged:    DME Agency:     HH Arranged:    HH Agency:     Status of Service:  In process, will continue to follow  If discussed at Long Length of Stay Meetings, dates discussed:    Additional Comments:  [name], [name], RN 10/27/2017, 12:37 PM

## 2017-10-27 NOTE — Progress Notes (Signed)
Lasix is being held this am d/t Low BP 84/37 per Dr. Sharl Ma. Will recheck pts Bp again before shift change. Pt alert and oriented, no c/o dizziness. Will continue to monitor pt

## 2017-10-28 LAB — BASIC METABOLIC PANEL
ANION GAP: 13 (ref 5–15)
BUN: 23 mg/dL — ABNORMAL HIGH (ref 6–20)
CALCIUM: 8.6 mg/dL — AB (ref 8.9–10.3)
CO2: 26 mmol/L (ref 22–32)
Chloride: 97 mmol/L — ABNORMAL LOW (ref 101–111)
Creatinine, Ser: 1.09 mg/dL — ABNORMAL HIGH (ref 0.44–1.00)
GFR, EST NON AFRICAN AMERICAN: 52 mL/min — AB (ref >60)
Glucose, Bld: 79 mg/dL (ref 65–99)
POTASSIUM: 3.5 mmol/L (ref 3.5–5.1)
SODIUM: 136 mmol/L (ref 135–145)

## 2017-10-28 LAB — CBC
HEMATOCRIT: 47.9 % — AB (ref 36.0–46.0)
HEMOGLOBIN: 15 g/dL (ref 12.0–15.0)
MCH: 29.3 pg (ref 26.0–34.0)
MCHC: 31.3 g/dL (ref 30.0–36.0)
MCV: 93.6 fL (ref 78.0–100.0)
Platelets: 149 10*3/uL — ABNORMAL LOW (ref 150–400)
RBC: 5.12 MIL/uL — ABNORMAL HIGH (ref 3.87–5.11)
RDW: 17.1 % — ABNORMAL HIGH (ref 11.5–15.5)
WBC: 5.6 10*3/uL (ref 4.0–10.5)

## 2017-10-28 LAB — GLUCOSE, CAPILLARY
GLUCOSE-CAPILLARY: 83 mg/dL (ref 65–99)
GLUCOSE-CAPILLARY: 202 mg/dL — AB (ref 65–99)
GLUCOSE-CAPILLARY: 84 mg/dL (ref 65–99)
Glucose-Capillary: 128 mg/dL — ABNORMAL HIGH (ref 65–99)

## 2017-10-28 NOTE — Progress Notes (Signed)
PROGRESS NOTE    Donna Graves  WUJ:[id number] DOB: 03/01/1951 DOA: 10/26/2017 PCP: Wilson Singer, MD     Brief Narrative:  67 y/o woman admitted from home on 2/7 with complaints of anasarca as well as redness to her RLQ (she has a large pannus). States this redness is at the spot where she had been receiving blood-thining shots during her prior hospitalization. Admission was requested.   Assessment & Plan:   Principal Problem:   Acute on chronic congestive heart failure (HCC) Active Problems:   Type II diabetes mellitus (HCC)   AF (paroxysmal atrial fibrillation) (HCC)   Essential hypertension   Elevated troponin   Morbid obesity (HCC)   Panniculitis   Acute on Chronic Combined CHF -ECHO from 01/19 reviewed: EF 25%, technically limited study due to body habitus, cannot comment on wall motion. Moderate RV dysfunction. -She is 890 cc negative since admission. (Doubt accuracy of Is and Os). -Still significantly hypervolemic. Continue to strive for negative fluid balance. -Would continue current dose of IV lasix of 40 mg IV BID. -Continue coreg, spironolactone. -Not on ACE-I/ARB due to renal dysfunction.  Panniculitis -Continue vancomycin. -improving.  Acute on CKD Stage III -Baseline Cr appears to be around 0.9-1.0 with a GFR of around 55-60. -Cr is currently at baseline at 1.09 -Monitor with diuresis.  Elevated Troponin -Flat, no CP. -EKG without acute changes. -Recent ECHO from 09/2017 with reduced EF. -No plans for further cardiac work up at present.  Type II DM -Fair control. -Continue current regimen.  Morbid Obesity -Noted.  Essential Hypertension -Well controlled. -Continue current regimen.   DVT prophylaxis: Lovenox Code Status: Full Code Family Communication: Sister at bedside updated on plan of care and all questions answered Disposition Plan: per PT ok to DC home with HHPT Consultants:   None  Procedures:   None  Antimicrobials:    Anti-infectives (From admission, onward)   Start     Dose/Rate Route Frequency Ordered Stop   10/27/17 2200  vancomycin (VANCOCIN) 1,500 mg in sodium chloride 0.9 % 500 mL IVPB     1,500 mg 250 mL/hr over 120 Minutes Intravenous Every 24 hours 10/26/17 2155     10/26/17 2200  vancomycin (VANCOCIN) 2,000 mg in sodium chloride 0.9 % 500 mL IVPB     2,000 mg 250 mL/hr over 120 Minutes Intravenous  Once 10/26/17 2155 10/27/17 0154       Subjective: Feels better, less SOB  Objective: Vitals:   10/27/17 1900 10/27/17 2100 10/28/17 0553 10/28/17 1454  BP:  (!) 108/59 (!) 101/54 (!) 94/54  Pulse:  70 71 69  Resp:  17 16 18   Temp:  97.8 F (36.6 C) (!) 97.5 F (36.4 C) (!) 97.5 F (36.4 C)  TempSrc:  Oral Oral   SpO2:  93% 93% 93%  Weight: (!) 140.5 kg (309 lb 11.9 oz)  (!) 142 kg (313 lb 0.9 oz)   Height:        Intake/Output Summary (Last 24 hours) at 10/28/2017 1646 Last data filed at 10/28/2017 1455 Gross per 24 hour  Intake 1220 ml  Output 1600 ml  Net -380 ml   Filed Weights   10/27/17 0500 10/27/17 1900 10/28/17 0553  Weight: (!) 140.7 kg (310 lb 3 oz) (!) 140.5 kg (309 lb 11.9 oz) (!) 142 kg (313 lb 0.9 oz)    Examination:  General exam: Alert, awake, oriented x 3 Respiratory system: Clear to auscultation. Respiratory effort normal. Cardiovascular system:RRR. No murmurs, rubs,  gallops. Gastrointestinal system: Abdomen is nondistended, right redness surrounding the right side of her pannus, improved from yesterday  Central nervous system: Alert and oriented. No focal neurological deficits. Extremities: 2+ edema +pedal pulses Skin: No rashes, lesions or ulcers Psychiatry: Judgement and insight appear normal. Mood & affect appropriate.       Data Reviewed: I have personally reviewed following labs and imaging studies  CBC: Recent Labs  Lab 10/26/17 1611 10/28/17 0604  WBC 6.6 5.6  NEUTROABS 4.5  --   HGB 16.0* 15.0  HCT 50.5* 47.9*  MCV 93.7 93.6  PLT  159 149*   Basic Metabolic Panel: Recent Labs  Lab 10/26/17 1611 10/27/17 0638 10/28/17 0604  NA 134* 136 136  K 3.2* 3.4* 3.5  CL 96* 98* 97*  CO2 24 28 26   GLUCOSE 155* 85 79  BUN 27* 26* 23*  CREATININE 1.16* 1.20* 1.09*  CALCIUM 9.4 8.7* 8.6*   GFR: Estimated Creatinine Clearance: 72.8 mL/min (A) (by C-G formula based on SCr of 1.09 mg/dL (H)). Liver Function Tests: Recent Labs  Lab 10/26/17 1611  AST 83*  ALT 23  ALKPHOS 109  BILITOT 2.4*  PROT 6.4*  ALBUMIN 3.1*   No results for input(s): LIPASE, AMYLASE in the last 168 hours. No results for input(s): AMMONIA in the last 168 hours. Coagulation Profile: No results for input(s): INR, PROTIME in the last 168 hours. Cardiac Enzymes: Recent Labs  Lab 10/26/17 1611 10/26/17 2105  TROPONINI 0.04* 0.05*   BNP (last 3 results) No results for input(s): PROBNP in the last 8760 hours. HbA1C: No results for input(s): HGBA1C in the last 72 hours. CBG: Recent Labs  Lab 10/27/17 1207 10/27/17 1711 10/27/17 2147 10/28/17 0735 10/28/17 1144  GLUCAP 147* 136* 185* 83 128*   Lipid Profile: No results for input(s): CHOL, HDL, LDLCALC, TRIG, CHOLHDL, LDLDIRECT in the last 72 hours. Thyroid Function Tests: No results for input(s): TSH, T4TOTAL, FREET4, T3FREE, THYROIDAB in the last 72 hours. Anemia Panel: No results for input(s): VITAMINB12, FOLATE, FERRITIN, TIBC, IRON, RETICCTPCT in the last 72 hours. Urine analysis:    Component Value Date/Time   COLORURINE STRAW (A) 10/26/2017 1551   APPEARANCEUR CLEAR 10/26/2017 1551   LABSPEC 1.006 10/26/2017 1551   PHURINE 5.0 10/26/2017 1551   GLUCOSEU NEGATIVE 10/26/2017 1551   HGBUR NEGATIVE 10/26/2017 1551   BILIRUBINUR NEGATIVE 10/26/2017 1551   KETONESUR NEGATIVE 10/26/2017 1551   PROTEINUR NEGATIVE 10/26/2017 1551   NITRITE NEGATIVE 10/26/2017 1551   LEUKOCYTESUR NEGATIVE 10/26/2017 1551   Sepsis Labs: @LABRCNTIP (procalcitonin:4,lacticidven:4)  )No results  found for this or any previous visit (from the past 240 hour(s)).       Radiology Studies: No results found.      Scheduled Meds: . acidophilus  1 capsule Oral Daily  . aspirin EC  81 mg Oral Daily  . carvedilol  12.5 mg Oral BID WC  . enoxaparin (LOVENOX) injection  70 mg Subcutaneous Q24H  . furosemide  40 mg Intravenous Q12H  . glimepiride  4 mg Oral QAC breakfast  . insulin aspart  0-9 Units Subcutaneous TID WC  . magnesium oxide  400 mg Oral Daily  . nystatin   Topical BID  . polycarbophil  625 mg Oral QID  . potassium chloride SA  20 mEq Oral Daily  . simvastatin  40 mg Oral QPM  . sodium chloride flush  3 mL Intravenous Q12H  . spironolactone  25 mg Oral Daily  . thyroid  15 mg Oral  Daily   Continuous Infusions: . sodium chloride    . vancomycin Stopped (10/27/17 2312)     LOS: 2 days    Time spent: 25 minutes. Greater than 50% of this time was spent in direct contact with the patient coordinating care.     Chaya Jan, MD [facility] Pager [phone]  If 7PM-7AM, please contact night-coverage [email or web address] Password [facility] 10/28/2017, 4:46 PM

## 2017-10-28 NOTE — Evaluation (Signed)
Physical Therapy Evaluation Patient Details Name: Donna Graves MRN: [id number] DOB: 1951-02-10 Today's Date: 10/28/2017   History of Present Illness  Donna Graves is a 67 y.o. female with medical history significant of chronic congestive heart failure systolic dysfunction with EF of 25%, morbid obesity with a discharge weight of 312 pounds on October 21, 2017, nonischemic cardia myopathy with AICD, diabetes, hypertension comes in with swelling again to her legs into her pannus and redness to her right lower quadrant pannus and feeling like it is hot.  She states she went home on Saturday on June 2 and was doing very well had hardly any swelling.  Since that time she is starting to swell again.  She is reports she is compliant with all of her medications and she is taking every single dose.  She also noted on Sunday she started having redness spread on her stomach where she was previously getting shots to her abdomen.  The area has gotten redder and more painful and feels hot.  She denies any chills.  She denies any nausea or vomiting.  Patient found to be in congestive heart failure and referred for admission for CHF exacerbation.  No report of cellulitis to her abdomen no antibiotics given yet.  Clinical Impression  Donna Graves normally uses a cane or rolling walker to ambulate.  She Currently is able to complete bed mobility with increased time.  She is able to get on and off the commode with increase time.  She demonstrates decreased activity tolerance needing to rest one time with a 140 ft walk.  Donna Graves will benefit from skilled PT to increase her activity tolerance.     Follow Up Recommendations Home health PT    Equipment Recommendations  None recommended by PT    Recommendations for Other Services       Precautions / Restrictions Restrictions Weight Bearing Restrictions: No      Mobility  Bed Mobility Overal bed mobility: Modified Independent                 Transfers Overall transfer level: Modified independent                  Ambulation/Gait Ambulation/Gait assistance: Supervision Ambulation Distance (Feet): 140 Feet Assistive device: Rolling walker (2 wheeled) Gait Pattern/deviations: Decreased stride length;Decreased dorsiflexion - right;Step-through pattern   Gait velocity interpretation: Below normal speed for age/gender General Gait Details: PT neede one short rest break due to being SOB   Stairs            Wheelchair Mobility    Modified Rankin (Stroke Patients Only)             Pertinent Vitals/Pain Pain Assessment: No/denies pain    Home Living Family/patient expects to be discharged to:: Private residence Living Arrangements: Spouse/significant other Available Help at Discharge: Family Type of Home: Warehouse manager of Steps: 1 step to get in  Home Layout: One level Home Equipment: Environmental consultant - 2 wheels;Cane - single point      Prior Function Level of Independence: Independent with assistive device(s)               Hand Dominance        Extremity/Trunk Assessment        Lower Extremity Assessment Lower Extremity Assessment: Overall WFL for tasks assessed       Communication   Communication: No difficulties  Cognition Arousal/Alertness: Awake/alert Behavior During Therapy: WFL for tasks  assessed/performed Overall Cognitive Status: Within Functional Limits for tasks assessed                                               Assessment/Plan    PT Assessment All further PT needs can be met in the next venue of care  PT Problem List Decreased strength;Decreased activity tolerance       PT Treatment Interventions Therapeutic activities;Gait training;Therapeutic exercise;Balance training    PT Goals (Current goals can be found in the Care Plan section)       Frequency Min 3X/week           AM-PAC PT "6 Clicks" Daily Activity   Outcome Measure                  End of Session Equipment Utilized During Treatment: Gait belt Activity Tolerance: Patient tolerated treatment well Patient left: in bed;with call bell/phone within reach;with family/visitor present   PT Visit Diagnosis: Muscle weakness (generalized) (M62.81);Difficulty in walking, not elsewhere classified (R26.2)    Time: 1400-1430 PT Time Calculation (min) (ACUTE ONLY): 30 min   Charges:   PT Evaluation $PT Eval Low Complexity: [place], PT CLT [phone] 10/28/2017, 2:30 PM

## 2017-10-29 LAB — BASIC METABOLIC PANEL
ANION GAP: 15 (ref 5–15)
BUN: 18 mg/dL (ref 6–20)
CALCIUM: 8.5 mg/dL — AB (ref 8.9–10.3)
CHLORIDE: 97 mmol/L — AB (ref 101–111)
CO2: 26 mmol/L (ref 22–32)
CREATININE: 0.82 mg/dL (ref 0.44–1.00)
GFR calc Af Amer: >60 mL/min (ref >60)
GFR calc non Af Amer: >60 mL/min (ref >60)
GLUCOSE: 90 mg/dL (ref 65–99)
POTASSIUM: 3.3 mmol/L — AB (ref 3.5–5.1)
Sodium: 138 mmol/L (ref 135–145)

## 2017-10-29 LAB — GLUCOSE, CAPILLARY
GLUCOSE-CAPILLARY: 87 mg/dL (ref 65–99)
GLUCOSE-CAPILLARY: 115 mg/dL — AB (ref 65–99)
Glucose-Capillary: 181 mg/dL — ABNORMAL HIGH (ref 65–99)
Glucose-Capillary: 179 mg/dL — ABNORMAL HIGH (ref 65–99)

## 2017-10-29 MED ORDER — TORSEMIDE 20 MG PO TABS
80.0000 mg | ORAL_TABLET | Freq: Two times a day (BID) | ORAL | Status: DC
  Administered 2017-10-29 – 2017-10-30 (×2): 80 mg via ORAL
  Filled 2017-10-29 (×2): qty 4

## 2017-10-29 MED ORDER — POTASSIUM CHLORIDE CRYS ER 20 MEQ PO TBCR
40.0000 meq | EXTENDED_RELEASE_TABLET | Freq: Once | ORAL | Status: AC
  Administered 2017-10-29: 40 meq via ORAL
  Filled 2017-10-29: qty 2

## 2017-10-29 NOTE — Progress Notes (Signed)
PROGRESS NOTE    Donna Graves  ZOX:[id number] DOB: 1951/08/22 DOA: 10/26/2017 PCP: Wilson Singer, MD     Brief Narrative:  67 y/o woman admitted from home on 2/7 with complaints of anasarca as well as redness to her RLQ (she has a large pannus). States this redness is at the spot where she had been receiving blood-thining shots during her prior hospitalization. Admission was requested.   Assessment & Plan:   Principal Problem:   Acute on chronic congestive heart failure (HCC) Active Problems:   Type II diabetes mellitus (HCC)   AF (paroxysmal atrial fibrillation) (HCC)   Essential hypertension   Elevated troponin   Morbid obesity (HCC)   Panniculitis   Acute on Chronic Combined CHF -ECHO from 01/19 reviewed: EF 25%, technically limited study due to body habitus, cannot comment on wall motion. Moderate RV dysfunction. -She is 1.8 L negative since admission. (Doubt accuracy of Is and Os). -Continue to strive for negative fluid balance, volume status is approaching euvolemic. -We will DC IV Lasix today and transition over to home dose of torsemide 80 mg twice daily. -Continue coreg, spironolactone. -Not on ACE-I/ARB due to renal dysfunction.  Panniculitis -Continue vancomycin.  Plan to transition oral antibiotics in 24 hours. -improving.  Acute on CKD Stage III -Baseline Cr appears to be around 0.9-1.0 with a GFR of around 55-60. -Cr is currently at baseline at 0.82 -Monitor with diuresis.  Elevated Troponin -Flat, no CP. -EKG without acute changes. -Recent ECHO from 09/2017 with reduced EF. -No plans for further cardiac work up at present.  Type II DM -Fair control. -Continue current regimen.  Morbid Obesity -Noted.  Essential Hypertension -Well controlled. -Continue current regimen.   DVT prophylaxis: Lovenox Code Status: Full Code Family Communication: Patient only Disposition Plan: per PT ok to DC home with HHPT Consultants:   None     Procedures:   None  Antimicrobials:  Anti-infectives (From admission, onward)   Start     Dose/Rate Route Frequency Ordered Stop   10/27/17 2200  vancomycin (VANCOCIN) 1,500 mg in sodium chloride 0.9 % 500 mL IVPB     1,500 mg 250 mL/hr over 120 Minutes Intravenous Every 24 hours 10/26/17 2155     10/26/17 2200  vancomycin (VANCOCIN) 2,000 mg in sodium chloride 0.9 % 500 mL IVPB     2,000 mg 250 mL/hr over 120 Minutes Intravenous  Once 10/26/17 2155 10/27/17 0154       Subjective: Feels significantly improved.  States her abdominal swelling is improving.  Objective: Vitals:   10/28/17 0553 10/28/17 1454 10/28/17 2137 10/29/17 0500  BP: (!) 101/54 (!) 94/54 (!) 95/51 (!) 90/52  Pulse: 71 69 69 69  Resp: 16 18 17 16   Temp: (!) 97.5 F (36.4 C) (!) 97.5 F (36.4 C) 97.6 F (36.4 C) 97.6 F (36.4 C)  TempSrc: Oral  Oral Oral  SpO2: 93% 93% 97% 98%  Weight: (!) 142 kg (313 lb 0.9 oz)   (!) 142.3 kg (313 lb 11.4 oz)  Height:        Intake/Output Summary (Last 24 hours) at 10/29/2017 1523 Last data filed at 10/29/2017 0549 Gross per 24 hour  Intake 740 ml  Output 1700 ml  Net -960 ml   Filed Weights   10/27/17 1900 10/28/17 0553 10/29/17 0500  Weight: (!) 140.5 kg (309 lb 11.9 oz) (!) 142 kg (313 lb 0.9 oz) (!) 142.3 kg (313 lb 11.4 oz)    Examination:  General exam:  Alert, awake, oriented x 3 Respiratory system: Clear to auscultation. Respiratory effort normal. Cardiovascular system:RRR. No murmurs, rubs, gallops. Gastrointestinal system: Abdomen is obese nondistended, pannus is less edematous and erythematous soft and nontender. No organomegaly or masses felt. Normal bowel sounds heard. Central nervous system: Alert and oriented. No focal neurological deficits. Extremities: 1+ edema, +pedal pulses Skin: No rashes, lesions or ulcers Psychiatry: Judgement and insight appear normal. Mood & affect appropriate.        Data Reviewed: I have personally reviewed  following labs and imaging studies  CBC: Recent Labs  Lab 10/26/17 1611 10/28/17 0604  WBC 6.6 5.6  NEUTROABS 4.5  --   HGB 16.0* 15.0  HCT 50.5* 47.9*  MCV 93.7 93.6  PLT 159 149*   Basic Metabolic Panel: Recent Labs  Lab 10/26/17 1611 10/27/17 0638 10/28/17 0604 10/29/17 0608  NA 134* 136 136 138  K 3.2* 3.4* 3.5 3.3*  CL 96* 98* 97* 97*  CO2 24 28 26 26   GLUCOSE 155* 85 79 90  BUN 27* 26* 23* 18  CREATININE 1.16* 1.20* 1.09* 0.82  CALCIUM 9.4 8.7* 8.6* 8.5*   GFR: Estimated Creatinine Clearance: 96.9 mL/min (by C-G formula based on SCr of 0.82 mg/dL). Liver Function Tests: Recent Labs  Lab 10/26/17 1611  AST 83*  ALT 23  ALKPHOS 109  BILITOT 2.4*  PROT 6.4*  ALBUMIN 3.1*   No results for input(s): LIPASE, AMYLASE in the last 168 hours. No results for input(s): AMMONIA in the last 168 hours. Coagulation Profile: No results for input(s): INR, PROTIME in the last 168 hours. Cardiac Enzymes: Recent Labs  Lab 10/26/17 1611 10/26/17 2105  TROPONINI 0.04* 0.05*   BNP (last 3 results) No results for input(s): PROBNP in the last 8760 hours. HbA1C: No results for input(s): HGBA1C in the last 72 hours. CBG: Recent Labs  Lab 10/28/17 1144 10/28/17 1644 10/28/17 2155 10/29/17 0736 10/29/17 1119  GLUCAP 128* 202* 84 87 181*   Lipid Profile: No results for input(s): CHOL, HDL, LDLCALC, TRIG, CHOLHDL, LDLDIRECT in the last 72 hours. Thyroid Function Tests: No results for input(s): TSH, T4TOTAL, FREET4, T3FREE, THYROIDAB in the last 72 hours. Anemia Panel: No results for input(s): VITAMINB12, FOLATE, FERRITIN, TIBC, IRON, RETICCTPCT in the last 72 hours. Urine analysis:    Component Value Date/Time   COLORURINE STRAW (A) 10/26/2017 1551   APPEARANCEUR CLEAR 10/26/2017 1551   LABSPEC 1.006 10/26/2017 1551   PHURINE 5.0 10/26/2017 1551   GLUCOSEU NEGATIVE 10/26/2017 1551   HGBUR NEGATIVE 10/26/2017 1551   BILIRUBINUR NEGATIVE 10/26/2017 1551    KETONESUR NEGATIVE 10/26/2017 1551   PROTEINUR NEGATIVE 10/26/2017 1551   NITRITE NEGATIVE 10/26/2017 1551   LEUKOCYTESUR NEGATIVE 10/26/2017 1551   Sepsis Labs: @LABRCNTIP (procalcitonin:4,lacticidven:4)  )No results found for this or any previous visit (from the past 240 hour(s)).       Radiology Studies: No results found.      Scheduled Meds: . acidophilus  1 capsule Oral Daily  . aspirin EC  81 mg Oral Daily  . carvedilol  12.5 mg Oral BID WC  . enoxaparin (LOVENOX) injection  70 mg Subcutaneous Q24H  . furosemide  40 mg Intravenous Q12H  . glimepiride  4 mg Oral QAC breakfast  . insulin aspart  0-9 Units Subcutaneous TID WC  . magnesium oxide  400 mg Oral Daily  . nystatin   Topical BID  . polycarbophil  625 mg Oral QID  . potassium chloride SA  20 mEq Oral Daily  .  simvastatin  40 mg Oral QPM  . sodium chloride flush  3 mL Intravenous Q12H  . spironolactone  25 mg Oral Daily  . thyroid  15 mg Oral Daily   Continuous Infusions: . sodium chloride    . vancomycin Stopped (10/28/17 2340)     LOS: 3 days    Time spent: 25 minutes. Greater than 50% of this time was spent in direct contact with the patient coordinating care.     Chaya Jan, MD [facility] Pager [phone]  If 7PM-7AM, please contact night-coverage [email or web address] Password [facility] 10/29/2017, 3:23 PM

## 2017-10-29 NOTE — Progress Notes (Signed)
Pharmacy Antibiotic Note  Donna Graves is a 67 y.o. female admitted on 10/26/2017 with cellulitis.  Pharmacy has been consulted for Vancomycin dosing. Patient is improving and afebrile. Discussed with MD and plans to transition to po antibiotics tomorrow  Plan: Continue Vancomycin 1500mg  IV every 24 hours.  Goal trough 10-15 mcg/mL.  Monitor labs, micro and vitals.   Height: 5\' 4"  (162.6 cm) Weight: (!) 313 lb 11.4 oz (142.3 kg) IBW/kg (Calculated) : 54.7  Temp (24hrs), Avg:97.6 F (36.4 C), Min:97.6 F (36.4 C), Max:97.6 F (36.4 C)  Recent Labs  Lab 10/26/17 1611 10/27/17 0638 10/28/17 0604 10/29/17 0608  WBC 6.6  --  5.6  --   CREATININE 1.16* 1.20* 1.09* 0.82    Estimated Creatinine Clearance: 96.9 mL/min (by C-G formula based on SCr of 0.82 mg/dL).    Allergies  Allergen Reactions  . Codeine Rash    Antimicrobials this admission: Vanc 2/7 >>   Dose adjustments this admission: Obesity/Normalized CrCl Vancomycin dosing protocol estimated normalized increase CrCl = 77 ml/min  Microbiology results: no cultures  Thank you for allowing pharmacy to be a part of this patient's care.  Elder Cyphers, BS Pharm D, [place] Clinical Pharmacist Pager [phone] 10/29/2017 3:17 PM

## 2017-10-30 DIAGNOSIS — M793 Panniculitis, unspecified: Secondary | ICD-10-CM

## 2017-10-30 DIAGNOSIS — I5043 Acute on chronic combined systolic (congestive) and diastolic (congestive) heart failure: Secondary | ICD-10-CM

## 2017-10-30 LAB — BASIC METABOLIC PANEL
Anion gap: 11 (ref 5–15)
BUN: 16 mg/dL (ref 6–20)
CHLORIDE: 96 mmol/L — AB (ref 101–111)
CO2: 27 mmol/L (ref 22–32)
Calcium: 8.4 mg/dL — ABNORMAL LOW (ref 8.9–10.3)
Creatinine, Ser: 0.9 mg/dL (ref 0.44–1.00)
GFR calc Af Amer: >60 mL/min (ref >60)
GFR calc non Af Amer: >60 mL/min (ref >60)
GLUCOSE: 102 mg/dL — AB (ref 65–99)
POTASSIUM: 3.5 mmol/L (ref 3.5–5.1)
Sodium: 134 mmol/L — ABNORMAL LOW (ref 135–145)

## 2017-10-30 LAB — GLUCOSE, CAPILLARY
Glucose-Capillary: 90 mg/dL (ref 65–99)
Glucose-Capillary: 157 mg/dL — ABNORMAL HIGH (ref 65–99)

## 2017-10-30 MED ORDER — POTASSIUM CHLORIDE CRYS ER 20 MEQ PO TBCR: 20.0000 meq | EXTENDED_RELEASE_TABLET | Freq: Every day | ORAL | 2 refills | Status: DC

## 2017-10-30 MED ORDER — DOXYCYCLINE HYCLATE 100 MG PO TABS: 100.0000 mg | ORAL_TABLET | Freq: Two times a day (BID) | ORAL | Status: DC

## 2017-10-30 MED ORDER — DOXYCYCLINE HYCLATE 100 MG PO TABS: 100.0000 mg | ORAL_TABLET | Freq: Two times a day (BID) | ORAL | 0 refills | Status: DC

## 2017-10-30 MED ORDER — ASPIRIN 81 MG PO TBEC: 81.0000 mg | DELAYED_RELEASE_TABLET | Freq: Every day | ORAL | Status: AC

## 2017-10-30 NOTE — Care Management (Signed)
Home Health (Order [id number])  Nursing  Date: 10/30/2017 Department: [facility] Ordering/Authorizing: Philip Aspen, Limmie Patricia, MD   Philip Aspen, Limmie Patricia, MD NPI: [id number]    Patient Information   Patient Name Donna Graves, Donna Graves Sex Female DOB 13-Oct-1950 SSN [id number]  Order Information   Order Date/Time Release Date/Time Start Date/Time End Date/Time  10/30/17 12:22 PM None 10/30/17 12:22 PM Until Specified  Order History  Inpatient  Date/Time Action Taken User Additional Information  10/30/17 1222 Sign Henderson Cloud, MD   10/30/17 1222 Release Instance Philip Aspen, Limmie Patricia, MD (auto-released) Released Order: [id number]  10/30/17 1236 Acknowledge Gean Quint, RN New Order  Order Questions   Question Answer Comment  To provide the following care/treatments PT    RN

## 2017-10-30 NOTE — Discharge Summary (Signed)
Physician Discharge Summary  Donna Graves UUV:[id number] DOB: [birth year] DOA: 10/26/2017  PCP: Wilson Singer, MD  Admit date: 10/26/2017 Discharge date: 10/30/2017  Time spent: 45 minutes  Recommendations for Outpatient Follow-up:  -Will be discharged home today. -Advised to follow up with PCP in 1 week. -HH services have been arranged. -Will need to complete 10 day course of doxycycline for her panniculitis.   Discharge Diagnoses:  Principal Problem:   Acute on chronic congestive heart failure (HCC) Active Problems:   Type II diabetes mellitus (HCC)   AF (paroxysmal atrial fibrillation) (HCC)   Essential hypertension   Elevated troponin   Morbid obesity (HCC)   Panniculitis   Discharge Condition: Stable and improved  Filed Weights   10/28/17 0553 10/29/17 0500 10/30/17 0617  Weight: (!) 142 kg (313 lb 0.9 oz) (!) 142.3 kg (313 lb 11.4 oz) (!) 143 kg (315 lb 4.1 oz)    History of present illness:  As per Dr. Onalee Hua on 2/7: Donna Graves is a 67 y.o. female with medical history significant of chronic congestive heart failure systolic dysfunction with EF of 25%, morbid obesity with a discharge weight of 312 pounds on October 21, 2017, nonischemic cardia myopathy with AICD, diabetes, hypertension comes in with swelling again to her legs into her pannus and redness to her right lower quadrant pannus and feeling like it is hot.  She states she went home on Saturday on June 2 and was doing very well had hardly any swelling.  Since that time she is starting to swell again.  She is reports she is compliant with all of her medications and she is taking every single dose.  She also noted on Sunday she started having redness spread on her stomach where she was previously getting shots to her abdomen.  The area has gotten redder and more painful and feels hot.  She denies any chills.  She denies any nausea or vomiting.  Patient found to be in congestive heart failure and referred  for admission for CHF exacerbation.  No report of cellulitis to her abdomen no antibiotics given yet.    Hospital Course:   Acute on Chronic Combined CHF -ECHO from 01/19 reviewed: EF 25%, technically limited study due to body habitus, cannot comment on wall motion. Moderate RV dysfunction. -She is 3.0 L negative since admission. (Doubt accuracy of Is and Os). -I think she is a close to euvolemic as we will be able to get her. -Was transitioned over to PO torsemide 24 hours prior to DC and sustained good diuresis. -Continue coreg, spironolactone. -Not on ACE-I/ARB due to renal dysfunction.  Panniculitis -improving. -Will DC home on a 10 day course of doxycycline.  Acute on CKD Stage III -Baseline Cr appears to be around 0.9-1.0 with a GFR of around 55-60. -Cr is currently at baseline at 0.90 on DC.  Elevated Troponin -Flat, no CP. -EKG without acute changes. -Recent ECHO from 09/2017 with reduced EF. -No plans for further cardiac work up at present.  Type II DM -Fair control. -Continue current regimen.  Morbid Obesity -Noted.  Essential Hypertension -Well controlled. -Continue current regimen.     Procedures:  None   Consultations:  None  Discharge Instructions  Discharge Instructions    Diet - low sodium heart healthy   Complete by:  As directed    Increase activity slowly   Complete by:  As directed      Allergies as of 10/30/2017  Reactions   Codeine Rash      Medication List    TAKE these medications   acetaminophen 650 MG CR tablet Commonly known as:  TYLENOL Take 650-1,300 mg by mouth every 8 (eight) hours as needed for pain.   acidophilus Caps capsule Take 1 capsule by mouth daily. ULTIMATE FLORA PROBIOTIC   ALIVE ONCE DAILY WOMENS 50+ PO Take 1 tablet by mouth daily.   ARMOUR THYROID 15 MG tablet Generic drug:  thyroid Take 1 tablet by mouth daily.   aspirin 81 MG EC tablet Take 1 tablet (81 mg total) by mouth  daily. Start taking on:  10/31/2017   BLACK CHERRY CONCENTRATE PO Take 2 tablets by mouth daily.   carvedilol 12.5 MG tablet Commonly known as:  COREG TAKE 12.5 mg  BY MOUTH TWICE DAILY   dicyclomine 10 MG capsule Commonly known as:  BENTYL Take 10 mg by mouth 3 (three) times daily as needed for spasms.   doxycycline 100 MG tablet Commonly known as:  VIBRA-TABS Take 1 tablet (100 mg total) by mouth every 12 (twelve) hours.   escitalopram 5 MG tablet Commonly known as:  LEXAPRO Take 1 tablet by mouth at bedtime.   fluticasone 50 MCG/ACT nasal spray Commonly known as:  FLONASE Place 1-2 sprays into both nostrils daily as needed for allergies.   glimepiride 2 MG tablet Commonly known as:  AMARYL Take 4 mg by mouth daily. BEFORE A MEAL   magnesium oxide 400 MG tablet Commonly known as:  MAG-OX Take 400 mg by mouth daily.   omeprazole 20 MG capsule Commonly known as:  PRILOSEC Take 20 mg by mouth every morning.   polycarbophil 625 MG tablet Commonly known as:  FIBERCON Take 625 mg by mouth 4 (four) times daily.   potassium chloride SA 20 MEQ tablet Commonly known as:  K-DUR,KLOR-CON Take 1 tablet (20 mEq total) by mouth daily. Start taking on:  10/31/2017 What changed:    how much to take  how to take this  when to take this  additional instructions   RED YEAST RICE PO Take 2 tablets by mouth daily.   simvastatin 40 MG tablet Commonly known as:  ZOCOR Take 40 mg by mouth every evening.   spironolactone 25 MG tablet Commonly known as:  ALDACTONE Take 1 tablet (25 mg total) by mouth daily.   torsemide 20 MG tablet Commonly known as:  DEMADEX Take 4 tablets (80 mg total) by mouth 2 (two) times daily.   VITAMIN D PO Take 7,000 Units by mouth daily.      Allergies  Allergen Reactions  . Codeine Rash   Follow-up Information    Wilson Singer, MD. Schedule an appointment as soon as possible for a visit in 2 weeks.   Specialty:  Internal  Medicine Contact information: [address] [place] [address] [phone]            The results of significant diagnostics from this hospitalization (including imaging, microbiology, ancillary and laboratory) are listed below for reference.    Significant Diagnostic Studies: Dg Chest 2 View  Result Date: 10/26/2017 CLINICAL DATA:  Shortness of breath EXAM: CHEST  2 VIEW COMPARISON:  10/12/2017 FINDINGS: Chronic cardiomegaly. Dual-chamber ICD/pacer from the left. Chronic interstitial coarsening. No Kerley lines or effusion. No air bronchogram. IMPRESSION: 1. No acute finding when compared to prior. 2. Cardiomegaly. Electronically Signed   By: Marnee Spring M.D.   On: 10/26/2017 16:38   Dg Chest  2 View  Result Date: 10/12/2017 CLINICAL DATA:  Shortness of breath since October 08, 2017. History of CHF, atrial fibrillation, coronary artery disease, nonsmoker. EXAM: CHEST  2 VIEW COMPARISON:  Chest x-ray of August 17, 2017 FINDINGS: The lungs are adequately inflated. The right hemidiaphragm remains higher than the left. The interstitial markings are coarse but fairly stable. The cardiac silhouette is enlarged. The pulmonary vascularity is mildly engorged with mild cephalization noted. The ICD is in stable position. The mediastinum is normal in width. There is no significant pleural effusion. The bony thorax exhibits no acute abnormality. IMPRESSION: CHF with mild interstitial edema. There has not been significant interval change since the previous study. Electronically Signed   By: David  [place] M.D.   On: 10/12/2017 11:03    Microbiology: No results found for this or any previous visit (from the past 240 hour(s)).   Labs: Basic Metabolic Panel: Recent Labs  Lab 10/26/17 1611 10/27/17 6269 10/28/17 0604 10/29/17 0608 10/30/17 0536  NA 134* 136 136 138 134*  K 3.2* 3.4* 3.5 3.3* 3.5  CL 96* 98* 97* 97* 96*  CO2 24 28 26 26 27   GLUCOSE 155* 85 79 90 102*  BUN 27*  26* 23* 18 16  CREATININE 1.16* 1.20* 1.09* 0.82 0.90  CALCIUM 9.4 8.7* 8.6* 8.5* 8.4*   Liver Function Tests: Recent Labs  Lab 10/26/17 1611  AST 83*  ALT 23  ALKPHOS 109  BILITOT 2.4*  PROT 6.4*  ALBUMIN 3.1*   No results for input(s): LIPASE, AMYLASE in the last 168 hours. No results for input(s): AMMONIA in the last 168 hours. CBC: Recent Labs  Lab 10/26/17 1611 10/28/17 0604  WBC 6.6 5.6  NEUTROABS 4.5  --   HGB 16.0* 15.0  HCT 50.5* 47.9*  MCV 93.7 93.6  PLT 159 149*   Cardiac Enzymes: Recent Labs  Lab 10/26/17 1611 10/26/17 2105  TROPONINI 0.04* 0.05*   BNP: BNP (last 3 results) Recent Labs    08/17/17 0835 10/12/17 1118 10/26/17 1611  BNP 557.0* 417.0* 395.0*    ProBNP (last 3 results) No results for input(s): PROBNP in the last 8760 hours.  CBG: Recent Labs  Lab 10/29/17 1119 10/29/17 1744 10/29/17 2113 10/30/17 0756 10/30/17 1126  GLUCAP 181* 179* 115* 90 157*       Signed:  Chaya Jan  [facility] Pager: [phone] 10/30/2017, 1:42 PM

## 2017-10-30 NOTE — Progress Notes (Signed)
Patient IV removed, tolerated well. Patient given discharge instructions at bedside.  

## 2017-10-30 NOTE — Progress Notes (Signed)
Physical Therapy Treatment Patient Details Name: Donna Graves MRN: [id number] DOB: 1951/03/29 Today's Date: 10/30/2017    History of Present Illness Donna Graves is a 67 y.o. female with medical history significant of chronic congestive heart failure systolic dysfunction with EF of 25%, morbid obesity with a discharge weight of 312 pounds on October 21, 2017, nonischemic cardia myopathy with AICD, diabetes, hypertension comes in with swelling again to her legs into her pannus and redness to her right lower quadrant pannus and feeling like it is hot.  She states she went home on Saturday on June 2 and was doing very well had hardly any swelling.  Since that time she is starting to swell again.  She is reports she is compliant with all of her medications and she is taking every single dose.  She also noted on Sunday she started having redness spread on her stomach where she was previously getting shots to her abdomen.  The area has gotten redder and more painful and feels hot.  She denies any chills.  She denies any nausea or vomiting.  Patient found to be in congestive heart failure and referred for admission for CHF exacerbation.  No report of cellulitis to her abdomen no antibiotics given yet.    PT Comments    PT improved since 10/28/2016 with no rest break needed during ambulation.   Pt may be able to tolerate outpatient therapy vs. Home health at this time.   Follow Up Recommendations  Home health PT Possibly out patient      Equipment Recommendations  None recommended by PT    Recommendations for Other Services       Precautions / Restrictions Precautions Precautions: None Restrictions Weight Bearing Restrictions: No    Mobility  Bed Mobility Overal bed mobility: Modified Independent                Transfers Overall transfer level: Modified independent   Transfers: Sit to/from Stand Sit to Stand: Modified independent (Device/Increase time)             Ambulation/Gait Ambulation/Gait assistance: Supervision Ambulation Distance (Feet): 160 Feet(no rest breaks needed ) Assistive device: Rolling walker (2 wheeled) Gait Pattern/deviations: Decreased stride length;Decreased dorsiflexion - right;Step-through pattern Gait velocity: increased velocity but still below normal for age           Cognition Arousal/Alertness: Awake/alert Behavior During Therapy: WFL for tasks assessed/performed Overall Cognitive Status: Within Functional Limits for tasks assessed                                        Exercises General Exercises - Lower Extremity Long Arc Quad: Both;10 reps Hip ABduction/ADduction: Both;Seated;10 reps Hip Flexion/Marching: Standing;Both;10 reps Heel Raises: 10 reps;Standing Mini-Sqauts: Both;10 reps        Pertinent Vitals/Pain Pain Assessment: No/denies pain       Prior Function   Pt ambulated without assistive device          PT Goals (current goals can now be found in the care plan section) Acute Rehab PT Goals PT Goal Formulation: With patient Progress towards PT goals: Progressing toward goals    Frequency    Min 3X/week      PT Plan            End of Session Equipment Utilized During Treatment: Gait belt Activity Tolerance: Patient tolerated treatment well Patient left: in bed;with  call bell/phone within reach;with family/visitor present   PT Visit Diagnosis: Muscle weakness (generalized) (M62.81);Difficulty in walking, not elsewhere classified (R26.2)     Time: 1610-9604 PT Time Calculation (min) (ACUTE ONLY): 31 min  Charges:  $Gait Training: 8-22 mins $Therapeutic Exercise: 8-22 mins                    G CodesVirgina Organ, PT CLT [phone] 10/30/2017, 9:42 AM

## 2017-10-30 NOTE — Care Management Important Message (Signed)
Important Message  Patient Details  Name: Donna Graves MRN: [id number] Date of Birth: 1951/01/20   Medicare Important Message Given:  Yes    Arrion Burruel, [name], RN 10/30/2017, 11:05 AM

## 2017-10-30 NOTE — Care Management Note (Addendum)
Case Management Note  Patient Details  Name: Donna Graves MRN: [id number] Date of Birth: [birth year]     Expected Discharge Date:   10/30/2017               Expected Discharge Plan:  Home w Home Health Services  In-House Referral:     Discharge planning Services  CM Consult  Post Acute Care Choice:  Home Health, Resumption of Svcs/PTA Provider Choice offered to:  Patient  DME Arranged:    DME Agency:     HH Arranged:  RN, PT HH Agency:  [facility]  Status of Service:  Completed, signed off  If discussed at Long Length of Stay Meetings, dates discussed:    Additional Comments: Discussed options with patient regarding SNF vs HH. She was seen by PT and recommended for [facility] PT, however prior to admission patient reports being arranged to go SNF by PCP office. At this time, patient is declining SNF and wants to go home and continue with Home health. Will arrange PT and RN for disease management. Husband at bedside and agrees. Attending notified. Will send HH orders when available. Anticipate DC today. Kipp Brood, rep of [facility] aware of DC.   Olita Takeshita, [name], RN 10/30/2017, 11:02 AM

## 2017-11-09 ENCOUNTER — Telehealth: Payer: Self-pay | Admitting: Cardiology

## 2017-11-09 NOTE — Telephone Encounter (Signed)
LMOVM reminding pt to send remote transmission.   

## 2017-11-13 ENCOUNTER — Encounter: Payer: Self-pay | Admitting: Cardiology

## 2017-11-13 ENCOUNTER — Ambulatory Visit (INDEPENDENT_AMBULATORY_CARE_PROVIDER_SITE_OTHER): Payer: Medicare Other | Admitting: Cardiology

## 2017-11-13 ENCOUNTER — Encounter: Payer: Self-pay | Admitting: *Deleted

## 2017-11-13 VITALS — BP 100/70 | HR 70 | Ht 64.0 in | Wt 322.6 lb

## 2017-11-13 DIAGNOSIS — I471 Supraventricular tachycardia: Secondary | ICD-10-CM

## 2017-11-13 DIAGNOSIS — R55 Syncope and collapse: Secondary | ICD-10-CM | POA: Diagnosis not present

## 2017-11-13 DIAGNOSIS — I5022 Chronic systolic (congestive) heart failure: Secondary | ICD-10-CM | POA: Diagnosis not present

## 2017-11-13 MED ORDER — CARVEDILOL 6.25 MG PO TABS: 6.2500 mg | ORAL_TABLET | Freq: Two times a day (BID) | ORAL | 1 refills | Status: DC

## 2017-11-13 MED ORDER — LISINOPRIL 2.5 MG PO TABS: 2.5000 mg | ORAL_TABLET | Freq: Every day | ORAL | 1 refills | Status: DC

## 2017-11-13 NOTE — Patient Instructions (Signed)
Your physician recommends that you schedule a follow-up appointment in: 1 MONTH WITH DR [facility]  Your physician has recommended you make the following change in your medication:   DECREASE COREG 6.25 MG TWICE DAILY  START LISINOPRIL 2.5 MG DAILY  Your physician recommends that you return for lab work IN 2 WEEKS - WE HAVE GIVEN YOU LAB ORDERS TODAY - BMP/MG  Thank you for choosing [facility]!!

## 2017-11-13 NOTE — Progress Notes (Signed)
Clinical Summary Donna Graves is a 67 y.o.female seen today for follow up of the following medical problems.  1. Chronic systolic HF -From clinic notes originally followed in [place]. Apparently had normal cath in early 2000s, LVEF around that time was 30-35%. - 07/2013 [facility]: CI 2, mean PA 27, PCWP 27, nonobstructive CAD - - BiV upgrade 05/2017, from notes unable to place LV lead. - 04/2017 echo LVEF 25-30%  - admit 06/2017 with acute on chronic systolic HF. Negative 33L per discharge summary, weight 359 on admit to 313 on discharge.  - has had some issues with medication compliance, particulary her diuretics  -  discharge 08/19/17 weight 318 lbs.     - admit 10/2017 with fluid overload. Diuresed 25L, weight from 347 to 312 at discharge.  - admitted again later in 10/2017 with fluid overload, diuresed  - home weights 316 lbs recently - currently on Metolazone 2.5mg  on Tues, Fridays along with torsemide 80mg  bid    2. Atrial tachycardia - from prior cardiology notes s/p ablation - from prior hospital notes history was unclear and thought possibly to have been an aflutter ablation  - she denies any recent palpitations   3. Syncope - admission 07/2017. Device check was normal.   - no recent episodes  4. Bariatic surgery - patient is establishing with a bariatric clinic to consider surgical intervention for her severe obesity   Past Medical History:  Diagnosis Date  . Atrial fibrillation and flutter (HCC)    History of RFA and ultimately AV node ablation  . Cardiomyopathy (HCC)    a. LVEF 25-30% by echo in 04/2017.  Marland Kitchen CHF (congestive heart failure) (HCC)   . Chronic systolic heart failure (HCC)   . Depression   . GERD (gastroesophageal reflux disease)   . Gout   . History of cardiac catheterization 07/23/2013   RA 17; RV 43/20 PCWP 27.  LAD normal  LCx  norma  RCA normal.  LVEF 55%  . Hyperlipidemia   . Hypertension   . Hypothyroidism     . 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 Ladona Ridgel  . Type 2 diabetes mellitus (HCC)      Allergies  Allergen Reactions  . Codeine Rash     Current Outpatient Medications  Medication Sig Dispense Refill  . acetaminophen (TYLENOL) 650 MG CR tablet Take 650-1,300 mg by mouth every 8 (eight) hours as needed for pain.    Marland Kitchen acidophilus (RISAQUAD) CAPS capsule Take 1 capsule by mouth daily. ULTIMATE FLORA PROBIOTIC    . ARMOUR THYROID 15 MG tablet Take 1 tablet by mouth daily.  2  . aspirin EC 81 MG EC tablet Take 1 tablet (81 mg total) by mouth daily.    . carvedilol (COREG) 12.5 MG tablet TAKE 12.5 mg  BY MOUTH TWICE DAILY  1  . Cholecalciferol (VITAMIN D PO) Take 7,000 Units by mouth daily.    Marland Kitchen dicyclomine (BENTYL) 10 MG capsule Take 10 mg by mouth 3 (three) times daily as needed for spasms.    Marland Kitchen doxycycline (VIBRA-TABS) 100 MG tablet Take 1 tablet (100 mg total) by mouth every 12 (twelve) hours. 20 tablet 0  . escitalopram (LEXAPRO) 5 MG tablet Take 1 tablet by mouth at bedtime.  2  . fluticasone (FLONASE) 50 MCG/ACT nasal spray Place 1-2 sprays into both nostrils daily as needed for allergies.  0  . glimepiride (AMARYL) 2 MG tablet  Take 4 mg by mouth daily. BEFORE A MEAL    . magnesium oxide (MAG-OX) 400 MG tablet Take 400 mg by mouth daily.    . Misc Natural Products (BLACK CHERRY CONCENTRATE PO) Take 2 tablets by mouth daily.     . Multiple Vitamins-Minerals (ALIVE ONCE DAILY WOMENS 50+ PO) Take 1 tablet by mouth daily.    Marland Kitchen omeprazole (PRILOSEC) 20 MG capsule Take 20 mg by mouth every morning.    . polycarbophil (FIBERCON) 625 MG tablet Take 625 mg by mouth 4 (four) times daily.    . potassium chloride SA (K-DUR,KLOR-CON) 20 MEQ tablet Take 1 tablet (20 mEq total) by mouth daily. 30 tablet 2  . Red Yeast Rice Extract (RED YEAST RICE PO) Take 2 tablets by mouth daily.     . simvastatin (ZOCOR) 40 MG tablet  Take 40 mg by mouth every evening.     Marland Kitchen spironolactone (ALDACTONE) 25 MG tablet Take 1 tablet (25 mg total) by mouth daily. 30 tablet 0  . torsemide (DEMADEX) 20 MG tablet Take 4 tablets (80 mg total) by mouth 2 (two) times daily. 240 tablet 0   No current facility-administered medications for this visit.      Past Surgical History:  Procedure Laterality Date  . ANAL FISSURE REPAIR    . BACK SURGERY    . BIV UPGRADE N/A 05/23/2017   Procedure: BiVI Upgrade;  Surgeon: Marinus Maw, MD;  Location: [facility] INVASIVE CV LAB;  Service: Cardiovascular;  Laterality: N/A;  . ICD IMPLANT  05/23/2017   biv  . Left toe surgery       Allergies  Allergen Reactions  . Codeine Rash      Family History  Problem Relation Age of Onset  . Diabetes Mother 31  . Heart attack Mother   . Hypertension Mother   . Diabetes Brother 8  . Arthritis Father 52     Social History Donna Graves reports that  has never smoked. she has never used smokeless tobacco. Donna Graves reports that she does not drink alcohol.   Review of Systems CONSTITUTIONAL: No weight loss, fever, chills, weakness or fatigue.  HEENT: Eyes: No visual loss, blurred vision, double vision or yellow sclerae.No hearing loss, sneezing, congestion, runny nose or sore throat.  SKIN: No rash or itching.  CARDIOVASCULAR: per hpi RESPIRATORY: No shortness of breath, cough or sputum.  GASTROINTESTINAL: No anorexia, nausea, vomiting or diarrhea. No abdominal pain or blood.  GENITOURINARY: No burning on urination, no polyuria NEUROLOGICAL: occas dizziness  MUSCULOSKELETAL: No muscle, back pain, joint pain or stiffness.  LYMPHATICS: No enlarged nodes. No history of splenectomy.  PSYCHIATRIC: No history of depression or anxiety.  ENDOCRINOLOGIC: No reports of sweating, cold or heat intolerance. No polyuria or polydipsia.  Marland Kitchen   Physical Examination Vitals:   11/13/17 1426  BP: 100/70  Pulse: 70  SpO2: 94%   Vitals:   11/13/17 1426   Weight: (!) 322 lb 9.6 oz (146.3 kg)  Height: 5\' 4"  (1.626 m)     Gen: resting comfortably, no acute distress HEENT: no scleral icterus, pupils equal round and reactive, no palptable cervical adenopathy,  CV: RRR, no m/r/g, no jvd Resp: Clear to auscultation bilaterally GI: abdomen is soft, non-tender, non-distended, normal bowel sounds, no hepatosplenomegaly MSK: extremities are warm, no edema.  Skin: warm, no rash Neuro:  no focal deficits Psych: appropriate affect   Diagnostic Studies  Jan 2019 echo Study Conclusions  - Left ventricle: The estimated ejection fraction  was 25%. - Ventricular septum: Septal motion showed abnormal function and   dyssynergy. - Mitral valve: Mildly calcified annulus. - Right ventricle: The cavity size was mildly dilated. Systolic   function was moderately reduced.  Impressions:  - Limited study with Definity. Images are still quite limited. LVEF   estimated at approximately 25% based on the most consistent   views. Cannot specifically comment on focal wall motion although   there does look to be septal dyssynergy. There is also moderate   right ventricular dysfunction.  Assessment and Plan  1. Chronic systolic HF - appears euvolemic, home weights are stable - restart lisinopril 2.5mg  daily, lower coreg to 6.25mg  bid to allow room with her bp. Check BMET/Mg in 2 weeks  2. Atrial tachycardia - no symptoms, continue beta blocker  3. Syncope - no recent episodes, continue to monitor  4. Severe obesity - increased risk given her chronic systolic HF but not prohibitive. She is just starting her evaluation with the program. If surgery is considered would need to be euvolemic and stable on CHF meds.    Request pcp labs  F/u 1 month     Antoine Poche, M.D.

## 2017-11-17 ENCOUNTER — Encounter (HOSPITAL_COMMUNITY): Payer: Self-pay | Admitting: Emergency Medicine

## 2017-11-17 ENCOUNTER — Encounter: Payer: Self-pay | Admitting: Cardiology

## 2017-11-17 ENCOUNTER — Emergency Department (HOSPITAL_COMMUNITY)
Admission: EM | Admit: 2017-11-17 | Discharge: 2017-11-17 | Disposition: A | Discharge status: Home / Self Care | Payer: Medicare Other | Attending: Emergency Medicine | Admitting: Emergency Medicine

## 2017-11-17 ENCOUNTER — Telehealth: Payer: Self-pay

## 2017-11-17 ENCOUNTER — Emergency Department (HOSPITAL_COMMUNITY): Payer: Medicare Other

## 2017-11-17 DIAGNOSIS — I2510 Atherosclerotic heart disease of native coronary artery without angina pectoris: Secondary | ICD-10-CM | POA: Diagnosis not present

## 2017-11-17 DIAGNOSIS — R748 Abnormal levels of other serum enzymes: Secondary | ICD-10-CM | POA: Insufficient documentation

## 2017-11-17 DIAGNOSIS — Z95810 Presence of automatic (implantable) cardiac defibrillator: Secondary | ICD-10-CM | POA: Insufficient documentation

## 2017-11-17 DIAGNOSIS — I110 Hypertensive heart disease with heart failure: Secondary | ICD-10-CM | POA: Insufficient documentation

## 2017-11-17 DIAGNOSIS — Z7984 Long term (current) use of oral hypoglycemic drugs: Secondary | ICD-10-CM | POA: Diagnosis not present

## 2017-11-17 DIAGNOSIS — R7989 Other specified abnormal findings of blood chemistry: Secondary | ICD-10-CM

## 2017-11-17 DIAGNOSIS — Z79899 Other long term (current) drug therapy: Secondary | ICD-10-CM | POA: Diagnosis not present

## 2017-11-17 DIAGNOSIS — E039 Hypothyroidism, unspecified: Secondary | ICD-10-CM | POA: Diagnosis not present

## 2017-11-17 DIAGNOSIS — R55 Syncope and collapse: Secondary | ICD-10-CM

## 2017-11-17 DIAGNOSIS — E876 Hypokalemia: Secondary | ICD-10-CM

## 2017-11-17 DIAGNOSIS — I5022 Chronic systolic (congestive) heart failure: Secondary | ICD-10-CM | POA: Insufficient documentation

## 2017-11-17 DIAGNOSIS — Z7982 Long term (current) use of aspirin: Secondary | ICD-10-CM | POA: Insufficient documentation

## 2017-11-17 DIAGNOSIS — R778 Other specified abnormalities of plasma proteins: Secondary | ICD-10-CM

## 2017-11-17 DIAGNOSIS — E119 Type 2 diabetes mellitus without complications: Secondary | ICD-10-CM | POA: Insufficient documentation

## 2017-11-17 LAB — CBC
HCT: 50.8 % — ABNORMAL HIGH (ref 36.0–46.0)
HEMOGLOBIN: 16 g/dL — AB (ref 12.0–15.0)
MCH: 29.5 pg (ref 26.0–34.0)
MCHC: 31.5 g/dL (ref 30.0–36.0)
MCV: 93.6 fL (ref 78.0–100.0)
Platelets: 151 10*3/uL (ref 150–400)
RBC: 5.43 MIL/uL — AB (ref 3.87–5.11)
RDW: 16.9 % — ABNORMAL HIGH (ref 11.5–15.5)
WBC: 4.6 10*3/uL (ref 4.0–10.5)

## 2017-11-17 LAB — BASIC METABOLIC PANEL
ANION GAP: 16 — AB (ref 5–15)
BUN: 24 mg/dL — ABNORMAL HIGH (ref 6–20)
CHLORIDE: 83 mmol/L — AB (ref 101–111)
CO2: 34 mmol/L — AB (ref 22–32)
Calcium: 9 mg/dL (ref 8.9–10.3)
Creatinine, Ser: 1.1 mg/dL — ABNORMAL HIGH (ref 0.44–1.00)
GFR calc non Af Amer: 52 mL/min — ABNORMAL LOW (ref >60)
GFR, EST AFRICAN AMERICAN: 60 mL/min — AB (ref >60)
Glucose, Bld: 251 mg/dL — ABNORMAL HIGH (ref 65–99)
Potassium: 2.7 mmol/L — CL (ref 3.5–5.1)
SODIUM: 133 mmol/L — AB (ref 135–145)

## 2017-11-17 LAB — BRAIN NATRIURETIC PEPTIDE: B NATRIURETIC PEPTIDE 5: 480 pg/mL — AB (ref 0.0–100.0)

## 2017-11-17 LAB — URINALYSIS, ROUTINE W REFLEX MICROSCOPIC
Bilirubin Urine: NEGATIVE
Glucose, UA: NEGATIVE mg/dL
HGB URINE DIPSTICK: NEGATIVE
Ketones, ur: NEGATIVE mg/dL
LEUKOCYTES UA: NEGATIVE
Nitrite: NEGATIVE
Protein, ur: NEGATIVE mg/dL
SPECIFIC GRAVITY, URINE: 1.005 (ref 1.005–1.030)
pH: 8 (ref 5.0–8.0)

## 2017-11-17 LAB — TROPONIN I
TROPONIN I: 0.06 ng/mL — AB (ref <0.03)
Troponin I: 0.07 ng/mL (ref <0.03)

## 2017-11-17 MED ORDER — POTASSIUM CHLORIDE 20 MEQ PO PACK
40.0000 meq | PACK | Freq: Once | ORAL | Status: AC
  Administered 2017-11-17: 40 meq via ORAL
  Filled 2017-11-17: qty 2

## 2017-11-17 MED ORDER — POTASSIUM CHLORIDE CRYS ER 20 MEQ PO TBCR
40.0000 meq | EXTENDED_RELEASE_TABLET | Freq: Once | ORAL | Status: DC
  Filled 2017-11-17: qty 2

## 2017-11-17 NOTE — Discharge Instructions (Signed)
Your potassium was low today (2.7).  You were given a total of 80 mEq of potassium in the emergency department.  I recommend dosing yourself 3 times a day with potassium 40 mEq until you see your primary care doctor.  Additionally, your troponin was minimally elevated.  This has been elevated in the past and was a similar value.  Follow-up with your doctor or return here if worse.

## 2017-11-17 NOTE — ED Provider Notes (Signed)
[facility] EMERGENCY DEPARTMENT Provider Note   CSN: [id number] Arrival date & time: 11/17/17  1147     History   Chief Complaint Chief Complaint  Patient presents with  . Loss of Consciousness    HPI Donna Graves is a 67 y.o. female.  Level 5 caveat for urgent need for intervention.  Patient presents with syncopal spell while sitting earlier today.  This has happened several times before.  According to her husband she was "passed out" for 2 minutes.  She is now back to normal.  No chest pain, dyspnea, neurological deficits, fever, sweats, chills, dysuria.  She has multiple health problems well documented in the past medical record.      Past Medical History:  Diagnosis Date  . Atrial fibrillation and flutter (HCC)    History of RFA and ultimately AV node ablation  . Cardiomyopathy (HCC)    a. LVEF 25-30% by echo in 04/2017.  Marland Kitchen CHF (congestive heart failure) (HCC)   . Chronic systolic heart failure (HCC)   . Depression   . GERD (gastroesophageal reflux disease)   . Gout   . History of cardiac catheterization 07/23/2013   RA 17; RV 43/20 PCWP 27.  LAD normal  LCx  norma  RCA normal.  LVEF 55%  . Hyperlipidemia   . Hypertension   . Hypothyroidism   . 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 Ladona Ridgel  . Type 2 diabetes mellitus [facility])     Patient Active Problem List   Diagnosis Date Noted  . CHF exacerbation (HCC) 10/26/2017  . Panniculitis 10/26/2017  . Acute respiratory failure with hypoxia (HCC) 10/18/2017  . Atrial tachycardia (HCC) 10/18/2017  . Morbid obesity with BMI of 50.0-59.9, adult (HCC) 10/18/2017  . Morbid obesity (HCC) 10/12/2017  . Acute on chronic congestive heart failure (HCC) 08/17/2017  . Chronic systolic heart failure (HCC) 05/23/2017  . Atypical chest pain   . Elevated troponin   . Acute on chronic systolic CHF (congestive heart failure) (HCC) 03/30/2017  . Near  syncope 03/30/2017  . Type II diabetes mellitus (HCC) 03/30/2017  . AF (paroxysmal atrial fibrillation) (HCC) 03/30/2017  . Essential hypertension 03/30/2017  . CAD (coronary artery disease) 03/30/2017    Past Surgical History:  Procedure Laterality Date  . ANAL FISSURE REPAIR    . BACK SURGERY    . BIV UPGRADE N/A 05/23/2017   Procedure: BiVI Upgrade;  Surgeon: Marinus Maw, MD;  Location: [facility] INVASIVE CV LAB;  Service: Cardiovascular;  Laterality: N/A;  . ICD IMPLANT  05/23/2017   biv  . Left toe surgery      OB History    Gravida Para Term Preterm AB Living             1   SAB TAB Ectopic Multiple Live Births                   Home Medications    Prior to Admission medications   Medication Sig Start Date End Date Taking? Authorizing Provider  acetaminophen (TYLENOL) 650 MG CR tablet Take 650-1,300 mg by mouth every 8 (eight) hours as needed for pain.   Yes [provider]  acidophilus (RISAQUAD) CAPS capsule Take 1 capsule by mouth daily. ULTIMATE FLORA PROBIOTIC   Yes [provider]  aspirin EC 81 MG EC tablet Take 1 tablet (81 mg total) by mouth daily. 10/31/17  Yes Philip Aspen, Limmie Patricia, MD  carvedilol (  COREG) 6.25 MG tablet Take 1 tablet (6.25 mg total) by mouth 2 (two) times daily with a meal. 11/13/17  Yes Branch, Dorothe Pea, MD  lisinopril (PRINIVIL,ZESTRIL) 2.5 MG tablet Take 1 tablet (2.5 mg total) by mouth daily. 11/13/17 02/11/18 Yes Branch, Dorothe Pea, MD  potassium chloride SA (K-DUR,KLOR-CON) 20 MEQ tablet Take 1 tablet (20 mEq total) by mouth daily. 10/31/17  Yes Philip Aspen, Limmie Patricia, MD  spironolactone (ALDACTONE) 25 MG tablet Take 1 tablet (25 mg total) by mouth daily. 10/22/17  Yes Tat, Onalee Hua, MD  torsemide (DEMADEX) 20 MG tablet Take 4 tablets (80 mg total) by mouth 2 (two) times daily. 10/21/17  Yes Tat, Onalee Hua, MD  ARMOUR THYROID 15 MG tablet Take 1 tablet by mouth daily. 10/05/17   [provider]  Cholecalciferol (VITAMIN D  PO) Take 7,000 Units by mouth daily.    [provider]  dicyclomine (BENTYL) 10 MG capsule Take 10 mg by mouth 3 (three) times daily as needed for spasms.    [provider]  doxycycline (VIBRA-TABS) 100 MG tablet Take 1 tablet (100 mg total) by mouth every 12 (twelve) hours. Patient not taking: Reported on 11/17/2017 10/30/17   Philip Aspen, Limmie Patricia, MD  escitalopram (LEXAPRO) 5 MG tablet Take 1 tablet by mouth at bedtime. 07/24/17   [provider]  fluticasone (FLONASE) 50 MCG/ACT nasal spray Place 1-2 sprays into both nostrils daily as needed for allergies. 07/17/17   [provider]  glimepiride (AMARYL) 2 MG tablet Take 4 mg by mouth daily. BEFORE A MEAL    [provider]  magnesium oxide (MAG-OX) 400 MG tablet Take 400 mg by mouth daily.    [provider]  Misc Natural Products (BLACK CHERRY CONCENTRATE PO) Take 2 tablets by mouth daily.     [provider]  Multiple Vitamins-Minerals (ALIVE ONCE DAILY WOMENS 50+ PO) Take 1 tablet by mouth daily.    [provider]  omeprazole (PRILOSEC) 20 MG capsule Take 20 mg by mouth every morning.    [provider]  polycarbophil (FIBERCON) 625 MG tablet Take 625 mg by mouth 4 (four) times daily.    [provider]  Red Yeast Rice Extract (RED YEAST RICE PO) Take 2 tablets by mouth daily.     [provider]  simvastatin (ZOCOR) 40 MG tablet Take 40 mg by mouth every evening.     [provider]    Family History Family History  Problem Relation Age of Onset  . Diabetes Mother 5  . Heart attack Mother   . Hypertension Mother   . Diabetes Brother 6  . Arthritis Father 71    Social History Social History   Tobacco Use  . Smoking status: Never Smoker  . Smokeless tobacco: Never Used  Substance Use Topics  . Alcohol use: No    Alcohol/week: 0.0 oz  . Drug use: No     Allergies   Codeine   Review of Systems Review of  Systems  All other systems reviewed and are negative.    Physical Exam Updated Vital Signs BP (!) 104/58   Pulse 73   Temp 98 F (36.7 C) (Oral)   Resp 19   Ht 5\' 4"  (1.626 m)   Wt (!) 149.7 kg (330 lb)   SpO2 94%   BMI 56.64 kg/m   Physical Exam  Constitutional: She is oriented to person, place, and time.  Obese, nad  HENT:  Head: Normocephalic and atraumatic.  Eyes: Conjunctivae are normal.  Neck: Neck supple.  Cardiovascular: Normal rate and regular rhythm.  Pulmonary/Chest: Effort normal and breath sounds normal.  Abdominal: Soft. Bowel sounds are normal.  Musculoskeletal: Normal range of motion.  Neurological: She is alert and oriented to person, place, and time.  Skin: Skin is warm and dry.  Psychiatric: She has a normal mood and affect. Her behavior is normal.  Nursing note and vitals reviewed.    ED Treatments / Results  Labs (all labs ordered are listed, but only abnormal results are displayed) Labs Reviewed  BASIC METABOLIC PANEL - Abnormal; Notable for the following components:      Result Value   Sodium 133 (*)    Potassium 2.7 (*)    Chloride 83 (*)    CO2 34 (*)    Glucose, Bld 251 (*)    BUN 24 (*)    Creatinine, Ser 1.10 (*)    GFR calc non Af Amer 52 (*)    GFR calc Af Amer 60 (*)    Anion gap 16 (*)    All other components within normal limits  CBC - Abnormal; Notable for the following components:   RBC 5.43 (*)    Hemoglobin 16.0 (*)    HCT 50.8 (*)    RDW 16.9 (*)    All other components within normal limits  TROPONIN I - Abnormal; Notable for the following components:   Troponin I 0.07 (*)    All other components within normal limits  BRAIN NATRIURETIC PEPTIDE - Abnormal; Notable for the following components:   B Natriuretic Peptide 480.0 (*)    All other components within normal limits  TROPONIN I - Abnormal; Notable for the following components:   Troponin I 0.06 (*)    All other components within normal limits  URINALYSIS,  ROUTINE W REFLEX MICROSCOPIC    EKG  EKG Interpretation  Date/Time:  Friday November 17 2017 11:59:21 EST Ventricular Rate:  73 PR Interval:    QRS Duration: 151 QT Interval:  473 QTC Calculation: 522 R Axis:   -31 Text Interpretation:  A-V dual-paced rhythm with some inhibition No further analysis attempted due to paced rhythm Since last tracing now AV pacing is present Confirmed by Mancel Bale [phone]) on 11/17/2017 1:42:38 PM       Radiology Dg Chest Portable 1 View  Result Date: 11/17/2017 CLINICAL DATA:  Syncope EXAM: PORTABLE CHEST 1 VIEW COMPARISON:  10/26/2017 radiographs FINDINGS: Cardiomegaly and LEFT-sided pacemaker/ICD again noted. Mild pulmonary vascular congestion noted. There is no evidence of focal airspace disease, pulmonary edema, suspicious pulmonary nodule/mass, pleural effusion, or pneumothorax. No acute bony abnormalities are identified. IMPRESSION: Cardiomegaly with mild pulmonary vascular congestion. Electronically Signed   By: Harmon Pier M.D.   On: 11/17/2017 12:45    Procedures Procedures (including critical care time)  Medications Ordered in ED Medications  potassium chloride (KLOR-CON) packet 40 mEq (40 mEq Oral Given 11/17/17 1332)  potassium chloride (KLOR-CON) packet 40 mEq (40 mEq Oral Given 11/17/17 1806)     Initial Impression / Assessment and Plan / ED Course  I have reviewed the triage vital signs and the nursing notes.  Pertinent labs & imaging results that were available during my care of the patient were reviewed by me and considered in my medical decision making (see chart for details).     Patient is hemodynamically stable.  She remains baseline.  She was observed for multiple hours with no change in her status.  Troponin is  stable.  Troponin has been minimally elevated several times in the past.  No arrhythmias on monitoring.  She has been diagnosed with hypokalemia in the past and is on potassium replacement.  This was replaced orally in  the ED.  She will increase her potassium supplementation by 50%.  Final Clinical Impressions(s) / ED Diagnoses   Final diagnoses:  Syncope, unspecified syncope type  Hypokalemia  Elevated troponin    ED Discharge Orders    None       Donnetta Hutching, MD 11/18/17 1536

## 2017-11-17 NOTE — ED Notes (Signed)
CRITICAL VALUE ALERT  Critical Value:  Potassium 2.7                           Troponin -    0.07 Date & Time Notied: 11/17/17    1314  Provider Notified: Dr Adriana Simas   Orders Received/Actions taken: Dr went to bedside

## 2017-11-17 NOTE — Telephone Encounter (Signed)
Home health nurse contacted office stating that patient is having a burning sensation when she urinates. Home health nurse attempted to contact PCP but he's not in the office today. Nurse wanted to know if Dr. Wyline Mood would ok an order for a urinalysis with C&S

## 2017-11-17 NOTE — Telephone Encounter (Signed)
Left message with home health nurse

## 2017-11-17 NOTE — ED Notes (Signed)
Heart healthy meal provided 

## 2017-11-17 NOTE — Telephone Encounter (Signed)
That evaluation would have to come through pcp, if out of office they should have a physician or PA covering for him I would imagine. If bad enough could consider evaluation at urgent care   Dominga Ferry MD

## 2017-11-17 NOTE — ED Notes (Signed)
Lab in to draw

## 2017-11-17 NOTE — ED Notes (Signed)
Dr C in to discuss with pt

## 2017-11-17 NOTE — ED Notes (Signed)
Dr C in to assess  Labs drawn, XR  Pt is morbidly obeses Cardiac paced  Syncopal episode today while in chair with home health= no fall

## 2017-11-17 NOTE — ED Triage Notes (Signed)
Pt at home with home care and had syncopal episode  DC'd fro hospital 2/11 for CHF

## 2017-11-20 ENCOUNTER — Telehealth: Payer: Self-pay | Admitting: Cardiology

## 2017-11-20 NOTE — Telephone Encounter (Signed)
Patient states this morning she noticed a 6 pound weight gain. Patient has not seen any swelling or shortness of breath. Patient states she has not had any changes to diet.

## 2017-11-20 NOTE — Telephone Encounter (Signed)
Patient notified and verbalized understanding. 

## 2017-11-20 NOTE — Telephone Encounter (Signed)
Called stating that she has gained 6 pounds.....Marland Kitchen We were discounted due to bad connection

## 2017-11-20 NOTE — Telephone Encounter (Signed)
Labs from her recent ER visit would suggest that she may been a little dehydrated actually. Would continue to monitor at this time. She neds to call us Thursday and update [place], and we need the actual weights for each day reported at that time.   Dominga Ferry MD

## 2017-11-21 NOTE — Progress Notes (Signed)
No ICM remote transmission received for 11/09/2017 and next ICM transmission scheduled for 12/04/2017.

## 2017-12-04 ENCOUNTER — Ambulatory Visit (INDEPENDENT_AMBULATORY_CARE_PROVIDER_SITE_OTHER): Payer: Medicare Other | Admitting: *Deleted

## 2017-12-04 ENCOUNTER — Telehealth: Payer: Self-pay

## 2017-12-04 DIAGNOSIS — Z95810 Presence of automatic (implantable) cardiac defibrillator: Secondary | ICD-10-CM | POA: Diagnosis not present

## 2017-12-04 DIAGNOSIS — I5022 Chronic systolic (congestive) heart failure: Secondary | ICD-10-CM

## 2017-12-04 DIAGNOSIS — I442 Atrioventricular block, complete: Secondary | ICD-10-CM

## 2017-12-04 NOTE — Progress Notes (Signed)
EPIC Encounter for ICM Monitoring  Patient Name: Donna Graves is a 67 y.o. female Date: 12/04/2017 Primary Care Physican: Wilson Singer, MD Primary Cardiologist:Branch Electrophysiologist:Taylor Dry Weight:ER Weight recorded 11/17/17 was 330 lbs Bi-V Pacing:96%  Clinical Status (24-Oct-2017 to 04-Dec-2017) Treated VT/VF 0 episodes  AT/AF 54 episodes  Time in AT/AF 2.2 hr/day (9.1%)  Observations (2) (24-Oct-2017 to 04-Dec-2017)  AT/AF >= 6 hr for 5 days.  Patient Activity less than 1 hr/day for 5 weeks.      Attempted call to patient and unable to reach.  Left message to return call. Transmission reviewed.   Patient has had 3 ER visits since 10/15/2017 with most recent on 11/17/2017 for syncope.    Discharge weight on 10/28/2017 was 313 lbs and ER weight recorded on 11/17/2017 was 330 lbs.     Thoracic impedance  abnormal suggesting fluid accumulation since 07/22/2018 but trending back toward baseline.  Prescribed dosage: Torsemide20 mg take 4 tablets (80 mg total) twice a day. Potassium 20 mEq take 3 tabletsthree times a day.  Recommendations:  NONE - Unable to reach.  Follow-up plan: ICM clinic phone appointment on 12/12/2017 which is day before office appointment scheduled 12/13/2017 with Dr. Wyline Mood.  Copy of ICM check sent to Dr. Wyline Mood and Dr. Ladona Ridgel for review.   3 month ICM trend: 12/04/2017    1 Year ICM trend:       Karie Soda, RN 12/04/2017 12:27 PM

## 2017-12-04 NOTE — Progress Notes (Signed)
Remote ICD transmission.   

## 2017-12-04 NOTE — Telephone Encounter (Signed)
Remote ICM transmission received.  Attempted call to patient and left message to return call. 

## 2017-12-04 NOTE — Progress Notes (Signed)
Patient returned call.  She stated her weight has increased from 313 lbs (10/28/17 time of ER discharge) to 343 lbs yesterday.  She lost 7 lbs overnight last night due to Metolazone taken yesterday and weight is 336 lbs today.  She has swelling of feet/legs along with shortness of breath at times.  PCP is currently managing her fluid symptoms and has prescribed Metolazone 5 mg twice a week but PCP advised her to take it 3 times in the last week. He increased Potassium 20 mEq to 5 tablets three times a day.  She has follow up appointment with him 12/07/2017.  She said she has been very strict on limiting sodium and reading all the labels.  She is unsure what is causing the weight gain.  Advised if symptoms worsen to call PCP or use ER if needed.  She has appointment with Dr Wyline Mood 12/13/2017.

## 2017-12-05 ENCOUNTER — Encounter (HOSPITAL_COMMUNITY): Payer: Self-pay | Admitting: Emergency Medicine

## 2017-12-05 ENCOUNTER — Other Ambulatory Visit: Payer: Self-pay

## 2017-12-05 ENCOUNTER — Emergency Department (HOSPITAL_COMMUNITY): Payer: Medicare Other

## 2017-12-05 ENCOUNTER — Inpatient Hospital Stay (HOSPITAL_COMMUNITY)
Admission: EM | Admit: 2017-12-05 | Discharge: 2017-12-25 | DRG: 292 | Disposition: A | Discharge status: Home Health | Payer: Medicare Other | Attending: Internal Medicine | Admitting: Internal Medicine

## 2017-12-05 DIAGNOSIS — E876 Hypokalemia: Secondary | ICD-10-CM

## 2017-12-05 DIAGNOSIS — R748 Abnormal levels of other serum enzymes: Secondary | ICD-10-CM | POA: Diagnosis not present

## 2017-12-05 DIAGNOSIS — E785 Hyperlipidemia, unspecified: Secondary | ICD-10-CM | POA: Diagnosis present

## 2017-12-05 DIAGNOSIS — I959 Hypotension, unspecified: Secondary | ICD-10-CM | POA: Diagnosis not present

## 2017-12-05 DIAGNOSIS — Z8261 Family history of arthritis: Secondary | ICD-10-CM

## 2017-12-05 DIAGNOSIS — Z8249 Family history of ischemic heart disease and other diseases of the circulatory system: Secondary | ICD-10-CM

## 2017-12-05 DIAGNOSIS — I4892 Unspecified atrial flutter: Secondary | ICD-10-CM | POA: Diagnosis present

## 2017-12-05 DIAGNOSIS — I5023 Acute on chronic systolic (congestive) heart failure: Secondary | ICD-10-CM | POA: Diagnosis not present

## 2017-12-05 DIAGNOSIS — I428 Other cardiomyopathies: Secondary | ICD-10-CM | POA: Diagnosis present

## 2017-12-05 DIAGNOSIS — I429 Cardiomyopathy, unspecified: Secondary | ICD-10-CM | POA: Diagnosis not present

## 2017-12-05 DIAGNOSIS — I509 Heart failure, unspecified: Secondary | ICD-10-CM

## 2017-12-05 DIAGNOSIS — Z79890 Hormone replacement therapy (postmenopausal): Secondary | ICD-10-CM

## 2017-12-05 DIAGNOSIS — K219 Gastro-esophageal reflux disease without esophagitis: Secondary | ICD-10-CM | POA: Diagnosis present

## 2017-12-05 DIAGNOSIS — Z6841 Body Mass Index (BMI) 40.0 and over, adult: Secondary | ICD-10-CM

## 2017-12-05 DIAGNOSIS — I5043 Acute on chronic combined systolic (congestive) and diastolic (congestive) heart failure: Secondary | ICD-10-CM | POA: Diagnosis present

## 2017-12-05 DIAGNOSIS — G4733 Obstructive sleep apnea (adult) (pediatric): Secondary | ICD-10-CM | POA: Diagnosis present

## 2017-12-05 DIAGNOSIS — I471 Supraventricular tachycardia: Secondary | ICD-10-CM | POA: Diagnosis present

## 2017-12-05 DIAGNOSIS — Z95810 Presence of automatic (implantable) cardiac defibrillator: Secondary | ICD-10-CM

## 2017-12-05 DIAGNOSIS — R778 Other specified abnormalities of plasma proteins: Secondary | ICD-10-CM | POA: Diagnosis present

## 2017-12-05 DIAGNOSIS — I10 Essential (primary) hypertension: Secondary | ICD-10-CM | POA: Diagnosis present

## 2017-12-05 DIAGNOSIS — I110 Hypertensive heart disease with heart failure: Secondary | ICD-10-CM | POA: Diagnosis not present

## 2017-12-05 DIAGNOSIS — E871 Hypo-osmolality and hyponatremia: Secondary | ICD-10-CM | POA: Diagnosis not present

## 2017-12-05 DIAGNOSIS — Z833 Family history of diabetes mellitus: Secondary | ICD-10-CM

## 2017-12-05 DIAGNOSIS — R601 Generalized edema: Secondary | ICD-10-CM

## 2017-12-05 DIAGNOSIS — E119 Type 2 diabetes mellitus without complications: Secondary | ICD-10-CM | POA: Diagnosis present

## 2017-12-05 DIAGNOSIS — I5021 Acute systolic (congestive) heart failure: Secondary | ICD-10-CM | POA: Diagnosis not present

## 2017-12-05 DIAGNOSIS — I2510 Atherosclerotic heart disease of native coronary artery without angina pectoris: Secondary | ICD-10-CM | POA: Diagnosis present

## 2017-12-05 DIAGNOSIS — E118 Type 2 diabetes mellitus with unspecified complications: Secondary | ICD-10-CM | POA: Diagnosis not present

## 2017-12-05 DIAGNOSIS — Z950 Presence of cardiac pacemaker: Secondary | ICD-10-CM | POA: Diagnosis not present

## 2017-12-05 DIAGNOSIS — I472 Ventricular tachycardia: Secondary | ICD-10-CM | POA: Diagnosis not present

## 2017-12-05 DIAGNOSIS — M109 Gout, unspecified: Secondary | ICD-10-CM | POA: Diagnosis present

## 2017-12-05 DIAGNOSIS — E860 Dehydration: Secondary | ICD-10-CM | POA: Diagnosis present

## 2017-12-05 DIAGNOSIS — R9431 Abnormal electrocardiogram [ECG] [EKG]: Secondary | ICD-10-CM | POA: Diagnosis not present

## 2017-12-05 DIAGNOSIS — Z885 Allergy status to narcotic agent status: Secondary | ICD-10-CM

## 2017-12-05 DIAGNOSIS — R7989 Other specified abnormal findings of blood chemistry: Secondary | ICD-10-CM

## 2017-12-05 DIAGNOSIS — I248 Other forms of acute ischemic heart disease: Secondary | ICD-10-CM | POA: Diagnosis present

## 2017-12-05 DIAGNOSIS — I480 Paroxysmal atrial fibrillation: Secondary | ICD-10-CM | POA: Diagnosis present

## 2017-12-05 DIAGNOSIS — E039 Hypothyroidism, unspecified: Secondary | ICD-10-CM | POA: Diagnosis present

## 2017-12-05 LAB — BRAIN NATRIURETIC PEPTIDE: B Natriuretic Peptide: 400 pg/mL — ABNORMAL HIGH (ref 0.0–100.0)

## 2017-12-05 LAB — CBC
HCT: 48.6 % — ABNORMAL HIGH (ref 36.0–46.0)
HEMOGLOBIN: 15.4 g/dL — AB (ref 12.0–15.0)
MCH: 29.4 pg (ref 26.0–34.0)
MCHC: 31.7 g/dL (ref 30.0–36.0)
MCV: 92.7 fL (ref 78.0–100.0)
Platelets: 155 10*3/uL (ref 150–400)
RBC: 5.24 MIL/uL — AB (ref 3.87–5.11)
RDW: 17 % — ABNORMAL HIGH (ref 11.5–15.5)
WBC: 5.3 10*3/uL (ref 4.0–10.5)

## 2017-12-05 LAB — BASIC METABOLIC PANEL
ANION GAP: 15 (ref 5–15)
BUN: 25 mg/dL — ABNORMAL HIGH (ref 6–20)
CHLORIDE: 84 mmol/L — AB (ref 101–111)
CO2: 34 mmol/L — AB (ref 22–32)
Calcium: 9 mg/dL (ref 8.9–10.3)
Creatinine, Ser: 1.12 mg/dL — ABNORMAL HIGH (ref 0.44–1.00)
GFR calc non Af Amer: 50 mL/min — ABNORMAL LOW (ref >60)
GFR, EST AFRICAN AMERICAN: 58 mL/min — AB (ref >60)
GLUCOSE: 124 mg/dL — AB (ref 65–99)
Potassium: 3 mmol/L — ABNORMAL LOW (ref 3.5–5.1)
Sodium: 133 mmol/L — ABNORMAL LOW (ref 135–145)

## 2017-12-05 LAB — GLUCOSE, CAPILLARY: GLUCOSE-CAPILLARY: 75 mg/dL (ref 65–99)

## 2017-12-05 LAB — TROPONIN I: TROPONIN I: 0.04 ng/mL — AB (ref <0.03)

## 2017-12-05 MED ORDER — INSULIN ASPART 100 UNIT/ML ~~LOC~~ SOLN
0.0000 [IU] | Freq: Three times a day (TID) | SUBCUTANEOUS | Status: DC
  Administered 2017-12-06 (×2): 1 [IU] via SUBCUTANEOUS
  Administered 2017-12-07: 3 [IU] via SUBCUTANEOUS
  Administered 2017-12-07: 1 [IU] via SUBCUTANEOUS
  Administered 2017-12-08: 2 [IU] via SUBCUTANEOUS
  Administered 2017-12-09: 3 [IU] via SUBCUTANEOUS
  Administered 2017-12-09: 1 [IU] via SUBCUTANEOUS
  Administered 2017-12-10 (×2): 2 [IU] via SUBCUTANEOUS
  Administered 2017-12-11: 1 [IU] via SUBCUTANEOUS
  Administered 2017-12-11: 3 [IU] via SUBCUTANEOUS
  Administered 2017-12-12 (×2): 1 [IU] via SUBCUTANEOUS
  Administered 2017-12-12 – 2017-12-13 (×3): 3 [IU] via SUBCUTANEOUS
  Administered 2017-12-14: 2 [IU] via SUBCUTANEOUS
  Administered 2017-12-14: 1 [IU] via SUBCUTANEOUS
  Administered 2017-12-14: 2 [IU] via SUBCUTANEOUS
  Administered 2017-12-15: 3 [IU] via SUBCUTANEOUS
  Administered 2017-12-15: 1 [IU] via SUBCUTANEOUS
  Administered 2017-12-15 – 2017-12-21 (×10): 2 [IU] via SUBCUTANEOUS
  Administered 2017-12-21: 5 [IU] via SUBCUTANEOUS
  Administered 2017-12-22 (×2): 2 [IU] via SUBCUTANEOUS
  Administered 2017-12-22: 1 [IU] via SUBCUTANEOUS
  Administered 2017-12-23: 5 [IU] via SUBCUTANEOUS
  Administered 2017-12-23: 3 [IU] via SUBCUTANEOUS
  Administered 2017-12-23 – 2017-12-24 (×2): 1 [IU] via SUBCUTANEOUS
  Administered 2017-12-24: 3 [IU] via SUBCUTANEOUS
  Administered 2017-12-24 – 2017-12-25 (×2): 1 [IU] via SUBCUTANEOUS
  Administered 2017-12-25: 3 [IU] via SUBCUTANEOUS

## 2017-12-05 MED ORDER — FUROSEMIDE 10 MG/ML IJ SOLN
40.0000 mg | Freq: Every day | INTRAMUSCULAR | Status: DC
  Administered 2017-12-05: 40 mg via INTRAVENOUS
  Filled 2017-12-05: qty 4

## 2017-12-05 MED ORDER — SODIUM CHLORIDE 0.9% FLUSH
3.0000 mL | Freq: Two times a day (BID) | INTRAVENOUS | Status: DC
  Administered 2017-12-05 – 2017-12-25 (×33): 3 mL via INTRAVENOUS

## 2017-12-05 MED ORDER — ASPIRIN EC 81 MG PO TBEC
81.0000 mg | DELAYED_RELEASE_TABLET | Freq: Every day | ORAL | Status: DC
  Administered 2017-12-06 – 2017-12-25 (×20): 81 mg via ORAL
  Filled 2017-12-05 (×21): qty 1

## 2017-12-05 MED ORDER — POTASSIUM CHLORIDE 10 MEQ/100ML IV SOLN
10.0000 meq | Freq: Once | INTRAVENOUS | Status: AC
  Administered 2017-12-05: 10 meq via INTRAVENOUS
  Filled 2017-12-05: qty 100

## 2017-12-05 MED ORDER — SODIUM CHLORIDE 0.9% FLUSH
3.0000 mL | INTRAVENOUS | Status: DC | PRN
  Administered 2017-12-17: 3 mL via INTRAVENOUS
  Filled 2017-12-05: qty 3

## 2017-12-05 MED ORDER — SODIUM CHLORIDE 0.9 % IV SOLN: 250.0000 mL | INTRAVENOUS | Status: DC | PRN

## 2017-12-05 MED ORDER — ONDANSETRON HCL 4 MG/2ML IJ SOLN
4.0000 mg | Freq: Four times a day (QID) | INTRAMUSCULAR | Status: DC | PRN
Start: 2017-12-05 — End: 2017-12-16
  Administered 2017-12-06 – 2017-12-13 (×2): 4 mg via INTRAVENOUS
  Filled 2017-12-05 (×2): qty 2

## 2017-12-05 MED ORDER — METOLAZONE 5 MG PO TABS
5.0000 mg | ORAL_TABLET | Freq: Every day | ORAL | Status: DC
  Administered 2017-12-06 – 2017-12-07 (×2): 5 mg via ORAL
  Filled 2017-12-05 (×3): qty 1

## 2017-12-05 MED ORDER — ACETAMINOPHEN 325 MG PO TABS
650.0000 mg | ORAL_TABLET | ORAL | Status: DC | PRN
  Administered 2017-12-06 – 2017-12-25 (×15): 650 mg via ORAL
  Filled 2017-12-05 (×16): qty 2

## 2017-12-05 NOTE — ED Triage Notes (Signed)
Pt c/o of fluid retention is legs, hands, and abdominal.  States her abdominal is "weeping"   HX of CHF and takes fluid pills at home

## 2017-12-05 NOTE — ED Notes (Signed)
[phone]  Donna Graves

## 2017-12-05 NOTE — H&P (Signed)
History and Physical    Donna Graves WHQ:[id number] DOB: 07-06-1951 DOA: 12/05/2017  PCP: Wilson Singer, MD  Patient coming from: Home  Chief Complaint: Shortness of breath and swelling  HPI: Donna Graves is a 67 y.o. female with medical history significant of Congestive heart failure with a EF of 25%, morbid obesity, AICD, diabetes, A. fib comes in with over a week of increased weight gain of over 27 pounds along with shortness of breath and swelling all over.  Patient denies any fevers or cough.  Patient found to be in CHF exacerbation and referred for admission for such.  Her O2 sats are normal on room air but she is grossly volume overloaded.  She denies any chest pain.  Review of Systems: As per HPI otherwise 10 point review of systems negative.   Past Medical History:  Diagnosis Date  . Atrial fibrillation and flutter (HCC)    History of RFA and ultimately AV node ablation  . Cardiomyopathy (HCC)    a. LVEF 25-30% by echo in 04/2017.  Marland Kitchen CHF (congestive heart failure) (HCC)   . Chronic systolic heart failure (HCC)   . Depression   . GERD (gastroesophageal reflux disease)   . Gout   . History of cardiac catheterization 07/23/2013   RA 17; RV 43/20 PCWP 27.  LAD normal  LCx  norma  RCA normal.  LVEF 55%  . Hyperlipidemia   . Hypertension   . Hypothyroidism   . 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 Ladona Ridgel  . Type 2 diabetes mellitus (HCC)     Past Surgical History:  Procedure Laterality Date  . ANAL FISSURE REPAIR    . BACK SURGERY    . BIV UPGRADE N/A 05/23/2017   Procedure: BiVI Upgrade;  Surgeon: Marinus Maw, MD;  Location: [facility] INVASIVE CV LAB;  Service: Cardiovascular;  Laterality: N/A;  . ICD IMPLANT  05/23/2017   biv  . Left toe surgery       reports that  has never smoked. she has never used smokeless tobacco. She reports that she does not drink alcohol or use  drugs.  Allergies  Allergen Reactions  . Codeine Rash    Family History  Problem Relation Age of Onset  . Diabetes Mother 61  . Heart attack Mother   . Hypertension Mother   . Diabetes Brother 28  . Arthritis Father 67    Prior to Admission medications   Medication Sig Start Date End Date Taking? Authorizing Provider  acetaminophen (TYLENOL) 650 MG CR tablet Take 650-1,300 mg by mouth every 8 (eight) hours as needed for pain.   Yes [provider]  acidophilus (RISAQUAD) CAPS capsule Take 1 capsule by mouth daily. ULTIMATE FLORA PROBIOTIC   Yes [provider]  ARMOUR THYROID 15 MG tablet Take 1 tablet by mouth daily. 10/05/17  Yes [provider]  aspirin EC 81 MG EC tablet Take 1 tablet (81 mg total) by mouth daily. 10/31/17  Yes Philip Aspen, Limmie Patricia, MD  carvedilol (COREG) 6.25 MG tablet Take 1 tablet (6.25 mg total) by mouth 2 (two) times daily with a meal. 11/13/17  Yes Branch, Dorothe Pea, MD  Cholecalciferol (VITAMIN D PO) Take 7,000 Units by mouth daily.   Yes [provider]  dicyclomine (BENTYL) 10 MG capsule Take 10 mg by mouth 3 (three) times daily as needed for spasms.   Yes [provider]  glimepiride (AMARYL) 2  MG tablet Take 4 mg by mouth daily. BEFORE A MEAL   Yes [provider]  lisinopril (PRINIVIL,ZESTRIL) 2.5 MG tablet Take 1 tablet (2.5 mg total) by mouth daily. 11/13/17 02/11/18 Yes BranchDorothe Pea, MD  magnesium oxide (MAG-OX) 400 MG tablet Take 400 mg by mouth daily.   Yes [provider]  metolazone (ZAROXOLYN) 5 MG tablet Take 5 mg by mouth. 12/04/17 Per patient, PCP ordered Metolazone 1 tablet twice a week.   Yes Wilson Singer, MD  Misc Natural Products (BLACK CHERRY CONCENTRATE PO) Take 2 tablets by mouth daily.    Yes [provider]  Multiple Vitamins-Minerals (ALIVE ONCE DAILY WOMENS 50+ PO) Take 1 tablet by mouth daily.   Yes [provider]  omeprazole (PRILOSEC)  20 MG capsule Take 20 mg by mouth every morning.   Yes [provider]  polycarbophil (FIBERCON) 625 MG tablet Take 625 mg by mouth 4 (four) times daily.   Yes [provider]  potassium chloride SA (K-DUR,KLOR-CON) 20 MEQ tablet Take 1 tablet (20 mEq total) by mouth daily. Patient taking differently: Take 20 mEq by mouth daily. 12/04/17 Patient reported Dr Karilyn Cota increased to 5 tablets three times a day. 10/31/17  Yes Philip Aspen, Limmie Patricia, MD  Red Yeast Rice Extract (RED YEAST RICE PO) Take 2 tablets by mouth daily.    Yes [provider]  simvastatin (ZOCOR) 40 MG tablet Take 40 mg by mouth every evening.    Yes [provider]  torsemide (DEMADEX) 20 MG tablet Take 4 tablets (80 mg total) by mouth 2 (two) times daily. 10/21/17  Yes Tat, Onalee Hua, MD  doxycycline (VIBRA-TABS) 100 MG tablet Take 1 tablet (100 mg total) by mouth every 12 (twelve) hours. Patient not taking: Reported on 11/17/2017 10/30/17   Philip Aspen, Limmie Patricia, MD  spironolactone (ALDACTONE) 25 MG tablet Take 1 tablet (25 mg total) by mouth daily. Patient not taking: Reported on 12/05/2017 10/22/17   Catarina Hartshorn, MD    Physical Exam: Vitals:   12/05/17 1330 12/05/17 1331 12/05/17 1747  BP:  111/80 93/64  Pulse:  77 71  Resp:  20 17  Temp:  98.8 F (37.1 C)   TempSrc:  Oral   SpO2:  97% 99%  Weight: (!) 155.1 kg (342 lb)    Height: 5\' 4"  (1.626 m)        Constitutional: NAD, calm, comfortable Vitals:   12/05/17 1330 12/05/17 1331 12/05/17 1747  BP:  111/80 93/64  Pulse:  77 71  Resp:  20 17  Temp:  98.8 F (37.1 C)   TempSrc:  Oral   SpO2:  97% 99%  Weight: (!) 155.1 kg (342 lb)    Height: 5\' 4"  (1.626 m)     Eyes: PERRL, lids and conjunctivae normal ENMT: Mucous membranes are moist. Posterior pharynx clear of any exudate or lesions.Normal dentition.  Neck: normal, supple, no masses, no thyromegaly Respiratory: clear to auscultation bilaterally, no wheezing, no crackles.  Normal respiratory effort. No accessory muscle use.  Cardiovascular: Regular rate and rhythm, no murmurs / rubs / gallops.  2+ extremity edema. 2+ pedal pulses. No carotid bruits.  Abdomen: no tenderness, no masses palpated. No hepatosplenomegaly. Bowel sounds positive.  Pannus with dependent edema Musculoskeletal: no clubbing / cyanosis. No joint deformity upper and lower extremities. Good ROM, no contractures. Normal muscle tone.  Skin: no rashes, lesions, ulcers. No induration Neurologic: CN 2-12 grossly intact. Sensation intact, DTR normal. Strength 5/5 in all  4.  Psychiatric: Normal judgment and insight. Alert and oriented x 3. Normal mood.    Labs on Admission: I have personally reviewed following labs and imaging studies  CBC: Recent Labs  Lab 12/05/17 1422  WBC 5.3  HGB 15.4*  HCT 48.6*  MCV 92.7  PLT 155   Basic Metabolic Panel: Recent Labs  Lab 12/05/17 1422  NA 133*  K 3.0*  CL 84*  CO2 34*  GLUCOSE 124*  BUN 25*  CREATININE 1.12*  CALCIUM 9.0   GFR: Estimated Creatinine Clearance: 75 mL/min (A) (by C-G formula based on SCr of 1.12 mg/dL (H)). Liver Function Tests: No results for input(s): AST, ALT, ALKPHOS, BILITOT, PROT, ALBUMIN in the last 168 hours. No results for input(s): LIPASE, AMYLASE in the last 168 hours. No results for input(s): AMMONIA in the last 168 hours. Coagulation Profile: No results for input(s): INR, PROTIME in the last 168 hours. Cardiac Enzymes: No results for input(s): CKTOTAL, CKMB, CKMBINDEX, TROPONINI in the last 168 hours. BNP (last 3 results) No results for input(s): PROBNP in the last 8760 hours. HbA1C: No results for input(s): HGBA1C in the last 72 hours. CBG: No results for input(s): GLUCAP in the last 168 hours. Lipid Profile: No results for input(s): CHOL, HDL, LDLCALC, TRIG, CHOLHDL, LDLDIRECT in the last 72 hours. Thyroid Function Tests: No results for input(s): TSH, T4TOTAL, FREET4, T3FREE, THYROIDAB in the last 72  hours. Anemia Panel: No results for input(s): VITAMINB12, FOLATE, FERRITIN, TIBC, IRON, RETICCTPCT in the last 72 hours. Urine analysis:    Component Value Date/Time   COLORURINE YELLOW 11/17/2017 1300   APPEARANCEUR CLEAR 11/17/2017 1300   LABSPEC 1.005 11/17/2017 1300   PHURINE 8.0 11/17/2017 1300   GLUCOSEU NEGATIVE 11/17/2017 1300   HGBUR NEGATIVE 11/17/2017 1300   BILIRUBINUR NEGATIVE 11/17/2017 1300   KETONESUR NEGATIVE 11/17/2017 1300   PROTEINUR NEGATIVE 11/17/2017 1300   NITRITE NEGATIVE 11/17/2017 1300   LEUKOCYTESUR NEGATIVE 11/17/2017 1300   Sepsis Labs: !!!!!!!!!!!!!!!!!!!!!!!!!!!!!!!!!!!!!!!!!!!! @LABRCNTIP (procalcitonin:4,lacticidven:4) )No results found for this or any previous visit (from the past 240 hour(s)).   Radiological Exams on Admission: Dg Chest 2 View  Result Date: 12/05/2017 CLINICAL DATA:  Shortness of Breath EXAM: CHEST - 2 VIEW COMPARISON:  November 17, 2017 FINDINGS: There is no edema or consolidation. There is cardiomegaly with pulmonary venous hypertension. Pacemaker leads are attached to the right atrium, right ventricle, and coronary sinus. No adenopathy. No bone lesions. IMPRESSION: Pulmonary vascular congestion without edema or consolidation. Pacemaker leads as noted. Electronically Signed   By: Bretta Bang III M.D.   On: 12/05/2017 13:52    Old chart reviewed Echo done in January 2019 reviewed Case discussed with EDP Chest x-ray reviewed with edema no infiltrate  Assessment/Plan 67 year old female with acute on chronic systolic congestive heart failure Principal Problem:   Acute on chronic congestive heart failure (HCC)-patient with soft blood pressure at this time.  We will hold her Coreg and lisinopril in order for [place] to be able to diurese her.  Placed on Lasix 40 mg IV daily increase as her blood pressure tolerates.  We will not repeat cardiac echo at this time.  Active Problems:   Type II diabetes mellitus (HCC)-continue home Lantus  and sliding scale insulin   AF (paroxysmal atrial fibrillation) (HCC)-stable continue aspirin   Essential hypertension-again holding her Coreg and ACE inhibitor at this time secondary to soft blood pressure in order to increase her diuresis   Morbid obesity (HCC)-stable      DVT  prophylaxis: SCDs Code Status: Full Family Communication: None Disposition Plan:  Per day team Consults called:None  Admission status:  admission  Caniyah Murley A MD [facility]  If 7PM-7AM, please contact night-coverage [email or web address] Password [facility]  12/05/2017, 8:06 PM

## 2017-12-05 NOTE — ED Provider Notes (Signed)
[facility] EMERGENCY DEPARTMENT Provider Note   CSN: [id number] Arrival date & time: 12/05/17  1326     History   Chief Complaint Chief Complaint  Patient presents with  . Leg Swelling    HPI Donna Graves is a 67 y.o. female.  HPI  The patient is a 67 year old female with a known history of atrial fibrillation, severe cardiomyopathy with an ejection fraction of 25-30%, known history of coronary catheterization with normal coronaries, hyperlipidemia, hypertension and an implantable defibrillator secondary to her congestive heart failure.  She is also a known diabetic.  She reports that she is chronically edematous and is on multiple different medications and according to my review of the medical record she takes both a diuretic as well as Zaroxolyn.  The patient is up approximately 25 pounds since her dry weight, the home health nurse came to the house this morning and measured her at 342 pounds where her baseline is 314.  When her family doctor found out about it he recommended that she come to the emergency department immediately.  The patient denies significant shortness of breath but does have some orthopnea according to the husband who is in the room as an additional historian.  She has had increased amounts of peripheral edema as well as increased amounts of edema of her pannus of her abdomen.  She has been taking her diuretics as prescribed.  Symptoms continue to get worse which have now become severe.  Past Medical History:  Diagnosis Date  . Atrial fibrillation and flutter (HCC)    History of RFA and ultimately AV node ablation  . Cardiomyopathy (HCC)    a. LVEF 25-30% by echo in 04/2017.  Marland Kitchen CHF (congestive heart failure) (HCC)   . Chronic systolic heart failure (HCC)   . Depression   . GERD (gastroesophageal reflux disease)   . Gout   . History of cardiac catheterization 07/23/2013   RA 17; RV 43/20 PCWP 27.  LAD normal  LCx  norma  RCA normal.  LVEF 55%  .  Hyperlipidemia   . Hypertension   . Hypothyroidism   . 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 Ladona Ridgel  . Type 2 diabetes mellitus [facility])     Patient Active Problem List   Diagnosis Date Noted  . CHF exacerbation (HCC) 10/26/2017  . Panniculitis 10/26/2017  . Acute respiratory failure with hypoxia (HCC) 10/18/2017  . Atrial tachycardia (HCC) 10/18/2017  . Morbid obesity with BMI of 50.0-59.9, adult (HCC) 10/18/2017  . Morbid obesity (HCC) 10/12/2017  . Acute on chronic congestive heart failure (HCC) 08/17/2017  . Chronic systolic heart failure (HCC) 05/23/2017  . Atypical chest pain   . Elevated troponin   . Acute on chronic systolic CHF (congestive heart failure) (HCC) 03/30/2017  . Near syncope 03/30/2017  . Type II diabetes mellitus (HCC) 03/30/2017  . AF (paroxysmal atrial fibrillation) (HCC) 03/30/2017  . Essential hypertension 03/30/2017  . CAD (coronary artery disease) 03/30/2017    Past Surgical History:  Procedure Laterality Date  . ANAL FISSURE REPAIR    . BACK SURGERY    . BIV UPGRADE N/A 05/23/2017   Procedure: BiVI Upgrade;  Surgeon: Marinus Maw, MD;  Location: [facility] INVASIVE CV LAB;  Service: Cardiovascular;  Laterality: N/A;  . ICD IMPLANT  05/23/2017   biv  . Left toe surgery      OB History    Gravida Para Term Preterm AB Living  1   SAB TAB Ectopic Multiple Live Births                   Home Medications    Prior to Admission medications   Medication Sig Start Date End Date Taking? Authorizing Provider  acetaminophen (TYLENOL) 650 MG CR tablet Take 650-1,300 mg by mouth every 8 (eight) hours as needed for pain.   Yes [provider]  acidophilus (RISAQUAD) CAPS capsule Take 1 capsule by mouth daily. ULTIMATE FLORA PROBIOTIC   Yes [provider]  ARMOUR THYROID 15 MG tablet Take 1 tablet by mouth daily. 10/05/17  Yes [provider]    aspirin EC 81 MG EC tablet Take 1 tablet (81 mg total) by mouth daily. 10/31/17  Yes Philip Aspen, Limmie Patricia, MD  carvedilol (COREG) 6.25 MG tablet Take 1 tablet (6.25 mg total) by mouth 2 (two) times daily with a meal. 11/13/17  Yes Branch, Dorothe Pea, MD  Cholecalciferol (VITAMIN D PO) Take 7,000 Units by mouth daily.   Yes [provider]  dicyclomine (BENTYL) 10 MG capsule Take 10 mg by mouth 3 (three) times daily as needed for spasms.   Yes [provider]  glimepiride (AMARYL) 2 MG tablet Take 4 mg by mouth daily. BEFORE A MEAL   Yes [provider]  lisinopril (PRINIVIL,ZESTRIL) 2.5 MG tablet Take 1 tablet (2.5 mg total) by mouth daily. 11/13/17 02/11/18 Yes BranchDorothe Pea, MD  magnesium oxide (MAG-OX) 400 MG tablet Take 400 mg by mouth daily.   Yes [provider]  metolazone (ZAROXOLYN) 5 MG tablet Take 5 mg by mouth. 12/04/17 Per patient, PCP ordered Metolazone 1 tablet twice a week.   Yes Wilson Singer, MD  Misc Natural Products (BLACK CHERRY CONCENTRATE PO) Take 2 tablets by mouth daily.    Yes [provider]  Multiple Vitamins-Minerals (ALIVE ONCE DAILY WOMENS 50+ PO) Take 1 tablet by mouth daily.   Yes [provider]  omeprazole (PRILOSEC) 20 MG capsule Take 20 mg by mouth every morning.   Yes [provider]  polycarbophil (FIBERCON) 625 MG tablet Take 625 mg by mouth 4 (four) times daily.   Yes [provider]  potassium chloride SA (K-DUR,KLOR-CON) 20 MEQ tablet Take 1 tablet (20 mEq total) by mouth daily. Patient taking differently: Take 20 mEq by mouth daily. 12/04/17 Patient reported Dr Karilyn Cota increased to 5 tablets three times a day. 10/31/17  Yes Philip Aspen, Limmie Patricia, MD  Red Yeast Rice Extract (RED YEAST RICE PO) Take 2 tablets by mouth daily.    Yes [provider]  simvastatin (ZOCOR) 40 MG tablet Take 40 mg by mouth every evening.    Yes [provider]  torsemide  (DEMADEX) 20 MG tablet Take 4 tablets (80 mg total) by mouth 2 (two) times daily. 10/21/17  Yes Tat, Onalee Hua, MD  doxycycline (VIBRA-TABS) 100 MG tablet Take 1 tablet (100 mg total) by mouth every 12 (twelve) hours. Patient not taking: Reported on 11/17/2017 10/30/17   Philip Aspen, Limmie Patricia, MD  spironolactone (ALDACTONE) 25 MG tablet Take 1 tablet (25 mg total) by mouth daily. Patient not taking: Reported on 12/05/2017 10/22/17   Catarina Hartshorn, MD    Family History Family History  Problem Relation Age of Onset  . Diabetes Mother 78  . Heart attack Mother   . Hypertension Mother   . Diabetes Brother 26  . Arthritis Father 54    Social History Social History  Tobacco Use  . Smoking status: Never Smoker  . Smokeless tobacco: Never Used  Substance Use Topics  . Alcohol use: No    Alcohol/week: 0.0 oz  . Drug use: No     Allergies   Codeine   Review of Systems Review of Systems  All other systems reviewed and are negative.    Physical Exam Updated Vital Signs BP 93/64 (BP Location: Right Arm)   Pulse 71   Temp 98.8 F (37.1 C) (Oral)   Resp 17   Ht 5\' 4"  (1.626 m)   Wt (!) 155.1 kg (342 lb)   SpO2 99%   BMI 58.70 kg/m   Physical Exam  Constitutional: She appears well-developed and well-nourished. No distress.  HENT:  Head: Normocephalic and atraumatic.  Mouth/Throat: No oropharyngeal exudate.  Mucous membranes dehydrated  Eyes: Conjunctivae and EOM are normal. Pupils are equal, round, and reactive to light. Right eye exhibits no discharge. Left eye exhibits no discharge. No scleral icterus.  Neck: Normal range of motion. Neck supple. No JVD present. No thyromegaly present.  Cardiovascular: Normal rate, regular rhythm, normal heart sounds and intact distal pulses. Exam reveals no gallop and no friction rub.  No murmur heard. Pulmonary/Chest: Effort normal and breath sounds normal. No respiratory distress. She has no wheezes. She has no rales.  Abdominal: Soft.  Bowel sounds are normal. She exhibits distension. She exhibits no mass. There is tenderness.  Musculoskeletal: Normal range of motion. She exhibits edema. She exhibits no tenderness.  Lymphadenopathy:    She has no cervical adenopathy.  Neurological: She is alert. Coordination normal.  Skin: Skin is warm and dry. No rash noted. No erythema.  Psychiatric: She has a normal mood and affect. Her behavior is normal.  Nursing note and vitals reviewed.    ED Treatments / Results  Labs (all labs ordered are listed, but only abnormal results are displayed) Labs Reviewed  BASIC METABOLIC PANEL - Abnormal; Notable for the following components:      Result Value   Sodium 133 (*)    Potassium 3.0 (*)    Chloride 84 (*)    CO2 34 (*)    Glucose, Bld 124 (*)    BUN 25 (*)    Creatinine, Ser 1.12 (*)    GFR calc non Af Amer 50 (*)    GFR calc Af Amer 58 (*)    All other components within normal limits  CBC - Abnormal; Notable for the following components:   RBC 5.24 (*)    Hemoglobin 15.4 (*)    HCT 48.6 (*)    RDW 17.0 (*)    All other components within normal limits  BRAIN NATRIURETIC PEPTIDE - Abnormal; Notable for the following components:   B Natriuretic Peptide 400.0 (*)    All other components within normal limits    EKG  EKG Interpretation  Date/Time:  Tuesday December 05 2017 18:50:31 EDT Ventricular Rate:  79 PR Interval:    QRS Duration: 184 QT Interval:  510 QTC Calculation: 584 R Axis:   -23 Text Interpretation:  Ventricular-paced rhythm with occasional AV dual-paced complexes and with premature ventricular or aberrantly conducted complexes Abnormal ECG unchanged from prior Confirmed by Eber Hong ([id number]) on 12/05/2017 7:05:35 PM       Radiology Dg Chest 2 View  Result Date: 12/05/2017 CLINICAL DATA:  Shortness of Breath EXAM: CHEST - 2 VIEW COMPARISON:  November 17, 2017 FINDINGS: There is no edema or consolidation. There is cardiomegaly with pulmonary venous  hypertension. Pacemaker leads are attached to the right atrium, right ventricle, and coronary sinus. No adenopathy. No bone lesions. IMPRESSION: Pulmonary vascular congestion without edema or consolidation. Pacemaker leads as noted. Electronically Signed   By: Bretta Bang III M.D.   On: 12/05/2017 13:52    Procedures Procedures (including critical care time)  Medications Ordered in ED Medications  potassium chloride 10 mEq in 100 mL IVPB (not administered)     Initial Impression / Assessment and Plan / ED Course  I have reviewed the triage vital signs and the nursing notes.  Pertinent labs & imaging results that were available during my care of the patient were reviewed by me and considered in my medical decision making (see chart for details).  Clinical Course as of Dec 05 1904  Tue Dec 05, 2017  1735 DG Chest 2 View [AH]  [phone] The patient appears to be fluid depleted however she has significant edema, she has anasarca up onto the dependent edema of her abdomen and her large pannus, her blood pressure is tenuous just under 100 systolic complicating this further as further diuresis may cause significant problems.  Will discuss with the hospitalist for admission to the hospital.  [BM]    Clinical Course User Index [AH] Lenox Ahr, Student-PA [BM] Eber Hong, MD    Ultimately the patient has severe edema of both her legs going up towards her abdominal pannus.  She has definitely has dependent edema, this does not appear so much to be cellulitis.  She is not febrile but her blood pressure is 95 systolic, she appears very dehydrated in the mucous membranes further complicating her resuscitative measures.  Will discuss with the hospitalist, IV will be placed, gentle hydration, renal function at this time is at baseline with a creatinine of 1.1 compared to prior labs this is unchanged however she is hypokalemic and is likely chronically hypokalemic given her diuretic use.  Ray  shows vascular congestion but no overt  Labs show hypokalemia, the patient appears dry but is still fluid overloaded based on her dry weight.  Will discuss with hospitalist  The patient was discussed with Dr. Onalee Hua, she will admit the patient to the hospital.  Potassium replaced, magnesium given  Final Clinical Impressions(s) / ED Diagnoses   Final diagnoses:  Hypokalemia  Anasarca  Chronic congestive heart failure, unspecified heart failure type [facility])      Eber Hong, MD 12/05/17 1906

## 2017-12-06 ENCOUNTER — Encounter: Payer: Self-pay | Admitting: Cardiology

## 2017-12-06 DIAGNOSIS — I5021 Acute systolic (congestive) heart failure: Secondary | ICD-10-CM

## 2017-12-06 LAB — BASIC METABOLIC PANEL
Anion gap: 14 (ref 5–15)
BUN: 22 mg/dL — AB (ref 6–20)
CO2: 36 mmol/L — AB (ref 22–32)
Calcium: 8.7 mg/dL — ABNORMAL LOW (ref 8.9–10.3)
Chloride: 85 mmol/L — ABNORMAL LOW (ref 101–111)
Creatinine, Ser: 0.96 mg/dL (ref 0.44–1.00)
GFR calc Af Amer: >60 mL/min (ref >60)
GLUCOSE: 89 mg/dL (ref 65–99)
POTASSIUM: 2.3 mmol/L — AB (ref 3.5–5.1)
Sodium: 135 mmol/L (ref 135–145)

## 2017-12-06 LAB — GLUCOSE, CAPILLARY
GLUCOSE-CAPILLARY: 83 mg/dL (ref 65–99)
GLUCOSE-CAPILLARY: 91 mg/dL (ref 65–99)
Glucose-Capillary: 127 mg/dL — ABNORMAL HIGH (ref 65–99)
Glucose-Capillary: 139 mg/dL — ABNORMAL HIGH (ref 65–99)

## 2017-12-06 LAB — PHOSPHORUS: PHOSPHORUS: 4 mg/dL (ref 2.5–4.6)

## 2017-12-06 LAB — MAGNESIUM: MAGNESIUM: 1.8 mg/dL (ref 1.7–2.4)

## 2017-12-06 LAB — TROPONIN I
Troponin I: 0.05 ng/mL (ref <0.03)
Troponin I: 0.06 ng/mL (ref <0.03)

## 2017-12-06 MED ORDER — ENOXAPARIN SODIUM 80 MG/0.8ML ~~LOC~~ SOLN
80.0000 mg | SUBCUTANEOUS | Status: DC
  Administered 2017-12-06 – 2017-12-14 (×9): 80 mg via SUBCUTANEOUS
  Filled 2017-12-06 (×10): qty 0.8

## 2017-12-06 MED ORDER — POTASSIUM CHLORIDE 10 MEQ/100ML IV SOLN
10.0000 meq | INTRAVENOUS | Status: AC
  Administered 2017-12-06 (×3): 10 meq via INTRAVENOUS
  Filled 2017-12-06 (×3): qty 100

## 2017-12-06 MED ORDER — FUROSEMIDE 10 MG/ML IJ SOLN
40.0000 mg | Freq: Two times a day (BID) | INTRAMUSCULAR | Status: DC
  Administered 2017-12-06 – 2017-12-07 (×3): 40 mg via INTRAVENOUS
  Filled 2017-12-06 (×3): qty 4

## 2017-12-06 MED ORDER — TRAMADOL HCL 50 MG PO TABS
50.0000 mg | ORAL_TABLET | Freq: Once | ORAL | Status: AC
  Administered 2017-12-06: 50 mg via ORAL
  Filled 2017-12-06: qty 1

## 2017-12-06 MED ORDER — POTASSIUM CHLORIDE CRYS ER 20 MEQ PO TBCR
40.0000 meq | EXTENDED_RELEASE_TABLET | Freq: Two times a day (BID) | ORAL | Status: AC
  Administered 2017-12-06 (×2): 40 meq via ORAL
  Filled 2017-12-06 (×2): qty 2

## 2017-12-06 MED ORDER — POTASSIUM CHLORIDE CRYS ER 20 MEQ PO TBCR
40.0000 meq | EXTENDED_RELEASE_TABLET | Freq: Once | ORAL | Status: AC
  Administered 2017-12-06: 40 meq via ORAL
  Filled 2017-12-06: qty 2

## 2017-12-06 MED ORDER — POTASSIUM CHLORIDE CRYS ER 20 MEQ PO TBCR
20.0000 meq | EXTENDED_RELEASE_TABLET | Freq: Two times a day (BID) | ORAL | Status: DC
  Administered 2017-12-07: 20 meq via ORAL
  Filled 2017-12-06 (×2): qty 1

## 2017-12-06 NOTE — Progress Notes (Signed)
PROGRESS NOTE    PELE WEIDERT  TMH:[id number]  DOB: Mar 19, 1951  DOA: 12/05/2017 PCP: Wilson Singer, MD   Brief Admission Hx: Donna Graves is a 67 y.o. female with medical history significant of Congestive heart failure with a EF of 25%, morbid obesity, AICD, diabetes, A. fib comes in with over a week of increased weight gain of over 27 pounds along with shortness of breath and swelling all over.  Patient denies any fevers or cough.  Patient found to be in CHF exacerbation and referred for admission for such.  Her O2 sats are normal on room air but she is grossly volume overloaded.  She denies any chest pain.  MDM/Assessment & Plan:   1. Acute on chronic systolic heart failure exacerbation - I strongly suspect secondary to noncompliance with diet and medications.  Pt is grossly fluid overloaded.  Will continue IV diuresis with lasix 40 mg BID as BP and renal function allow. Monitor I/Os, weights closely. Continue metolazone.  Monitor telemetry and monitor electrolytes.  Pt had a recent echo last admission in Jan 2019.  2. Paroxysmal atrial fibrillation - stable, continue aspirin. 3. Essential Hypertension - with her current soft bPs, holding coreg and ACEI, restart as soon as possible as BP allows.   Echocardiogram 10/17/17 Study Conclusions  - Left ventricle: The estimated ejection fraction was 25%. - Ventricular septum: Septal motion showed abnormal function and dyssynergy. - Mitral valve: Mildly calcified annulus. - Right ventricle: The cavity size was mildly dilated. Systolic function was moderately reduced.  Impressions:  - Limited study with Definity. Images are still quite limited. LVEF estimated at approximately 25% based on the most consistent views. Cannot specifically comment on focal wall motion although   there does look to be septal dyssynergy. There is also moderate right ventricular dysfunction.   DVT prophylaxis: lovenox Code Status: Full  Family  Communication: patient Disposition Plan: continued IV diuresis needed  Subjective: Pt says that she is starting to feel a little better.   Objective: Vitals:   12/05/17 1830 12/05/17 2116 12/06/17 0551 12/06/17 1400  BP: 110/71 110/75 118/74 (!) 93/57  Pulse: 74 88 78 81  Resp: 20 20 20 20   Temp:  97.7 F (36.5 C) 98 F (36.7 C) 97.8 F (36.6 C)  TempSrc:  Oral Oral Oral  SpO2: 97% 93% 94% 94%  Weight:  (!) 154 kg (339 lb 8.1 oz) (!) 154.1 kg (339 lb 11.7 oz)   Height:  5\' 4"  (1.626 m)      Intake/Output Summary (Last 24 hours) at 12/06/2017 1543 Last data filed at 12/06/2017 1510 Gross per 24 hour  Intake 880 ml  Output 2950 ml  Net -2070 ml   Filed Weights   12/05/17 1330 12/05/17 2116 12/06/17 0551  Weight: (!) 155.1 kg (342 lb) (!) 154 kg (339 lb 8.1 oz) (!) 154.1 kg (339 lb 11.7 oz)   REVIEW OF SYSTEMS  As per history otherwise all reviewed and reported negative  Exam:  General exam: morbidly obese, sitting up in bed, NAD, cooperative.  Respiratory system: shallow but clear. No increased work of breathing. Cardiovascular system: S1 & S2 heard, RRR. No JVD, murmurs, gallops, clicks or pedal edema. Gastrointestinal system: Abdomen severely distended with fluid waves soft and nontender. Normal bowel sounds heard. Central nervous system: Alert and oriented. No focal neurological deficits. Extremities: 2+ bilateral pitting edema.  Data Reviewed: Basic Metabolic Panel: Recent Labs  Lab 12/05/17 1422 12/06/17 0241 12/06/17 0400  NA 133*  135  --   K 3.0* 2.3*  --   CL 84* 85*  --   CO2 34* 36*  --   GLUCOSE 124* 89  --   BUN 25* 22*  --   CREATININE 1.12* 0.96  --   CALCIUM 9.0 8.7*  --   MG  --   --  1.8  PHOS  --   --  4.0   Liver Function Tests: No results for input(s): AST, ALT, ALKPHOS, BILITOT, PROT, ALBUMIN in the last 168 hours. No results for input(s): LIPASE, AMYLASE in the last 168 hours. No results for input(s): AMMONIA in the last 168  hours. CBC: Recent Labs  Lab 12/05/17 1422  WBC 5.3  HGB 15.4*  HCT 48.6*  MCV 92.7  PLT 155   Cardiac Enzymes: Recent Labs  Lab 12/05/17 2032 12/06/17 0241 12/06/17 0851  TROPONINI 0.04* 0.05* 0.06*   CBG (last 3)  Recent Labs    12/05/17 2118 12/06/17 0735 12/06/17 1110  GLUCAP 75 83 127*   No results found for this or any previous visit (from the past 240 hour(s)).   Studies: Dg Chest 2 View  Result Date: 12/05/2017 CLINICAL DATA:  Shortness of Breath EXAM: CHEST - 2 VIEW COMPARISON:  November 17, 2017 FINDINGS: There is no edema or consolidation. There is cardiomegaly with pulmonary venous hypertension. Pacemaker leads are attached to the right atrium, right ventricle, and coronary sinus. No adenopathy. No bone lesions. IMPRESSION: Pulmonary vascular congestion without edema or consolidation. Pacemaker leads as noted. Electronically Signed   By: Bretta Bang III M.D.   On: 12/05/2017 13:52   Scheduled Meds: . aspirin EC  81 mg Oral Daily  . furosemide  40 mg Intravenous BID  . insulin aspart  0-9 Units Subcutaneous TID WC  . metolazone  5 mg Oral Daily  . [START ON 12/07/2017] potassium chloride  20 mEq Oral BID  . potassium chloride  40 mEq Oral BID  . sodium chloride flush  3 mL Intravenous Q12H   Continuous Infusions: . sodium chloride      Principal Problem:   Acute on chronic congestive heart failure (HCC) Active Problems:   Type II diabetes mellitus (HCC)   AF (paroxysmal atrial fibrillation) (HCC)   Essential hypertension   Morbid obesity (HCC)   CHF (congestive heart failure) (HCC)  Time spent:   Standley Dakins, MD, FAAFP [facility] Pager [phone]  If 7PM-7AM, please contact night-coverage [email or web address] Password [facility] 12/06/2017, 3:43 PM    LOS: 1 day

## 2017-12-06 NOTE — Progress Notes (Signed)
Patient's Potassium level 2.3 and Troponin 0.05. MD paged and new orders given for oral and IV Potassium.

## 2017-12-06 NOTE — Progress Notes (Signed)
ANTICOAGULATION CONSULT NOTE - Initial Consult  Pharmacy Consult for lovenox Indication: VTE prophylaxis  Allergies  Allergen Reactions  . Codeine Rash    Patient Measurements: Height: 5\' 4"  (162.6 cm) Weight: (!) 339 lb 11.7 oz (154.1 kg) IBW/kg (Calculated) : 54.7  Vital Signs: Temp: 97.8 F (36.6 C) (03/20 1400) Temp Source: Oral (03/20 1400) BP: 93/57 (03/20 1400) Pulse Rate: 81 (03/20 1400)  Labs: Recent Labs    12/05/17 1422 12/05/17 2032 12/06/17 0241 12/06/17 0851  HGB 15.4*  --   --   --   HCT 48.6*  --   --   --   PLT 155  --   --   --   CREATININE 1.12*  --  0.96  --   TROPONINI  --  0.04* 0.05* 0.06*    Estimated Creatinine Clearance: 87.2 mL/min (by C-G formula based on SCr of 0.96 mg/dL).   Medical History: Past Medical History:  Diagnosis Date  . Atrial fibrillation and flutter (HCC)    History of RFA and ultimately AV node ablation  . Cardiomyopathy (HCC)    a. LVEF 25-30% by echo in 04/2017.  Marland Kitchen CHF (congestive heart failure) (HCC)   . Chronic systolic heart failure (HCC)   . Depression   . GERD (gastroesophageal reflux disease)   . Gout   . History of cardiac catheterization 07/23/2013   RA 17; RV 43/20 PCWP 27.  LAD normal  LCx  norma  RCA normal.  LVEF 55%  . Hyperlipidemia   . Hypertension   . Hypothyroidism   . 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 Ladona Ridgel  . Type 2 diabetes mellitus (HCC)     Medications:  Medications Prior to Admission  Medication Sig Dispense Refill Last Dose  . acetaminophen (TYLENOL) 650 MG CR tablet Take 650-1,300 mg by mouth every 8 (eight) hours as needed for pain.   12/04/2017 at Unknown time  . acidophilus (RISAQUAD) CAPS capsule Take 1 capsule by mouth daily. ULTIMATE FLORA PROBIOTIC   12/05/2017 at Unknown time  . ARMOUR THYROID 15 MG tablet Take 1 tablet by mouth daily.  2 12/05/2017 at Unknown time  . aspirin EC 81 MG EC tablet  Take 1 tablet (81 mg total) by mouth daily.   12/05/2017 at Unknown time  . carvedilol (COREG) 6.25 MG tablet Take 1 tablet (6.25 mg total) by mouth 2 (two) times daily with a meal. 180 tablet 1 12/05/2017 at 0800  . Cholecalciferol (VITAMIN D PO) Take 7,000 Units by mouth daily.   12/05/2017 at Unknown time  . dicyclomine (BENTYL) 10 MG capsule Take 10 mg by mouth 3 (three) times daily as needed for spasms.   12/05/2017 at Unknown time  . glimepiride (AMARYL) 2 MG tablet Take 4 mg by mouth daily. BEFORE A MEAL   12/05/2017 at Unknown time  . lisinopril (PRINIVIL,ZESTRIL) 2.5 MG tablet Take 1 tablet (2.5 mg total) by mouth daily. 90 tablet 1 12/05/2017 at Unknown time  . magnesium oxide (MAG-OX) 400 MG tablet Take 400 mg by mouth daily.   12/05/2017 at Unknown time  . metolazone (ZAROXOLYN) 5 MG tablet Take 5 mg by mouth. 12/04/17 Per patient, PCP ordered Metolazone 1 tablet twice a week.   12/05/2017 at Unknown time  . Misc Natural Products (BLACK CHERRY CONCENTRATE PO) Take 2 tablets by mouth daily.    12/05/2017 at Unknown time  . Multiple Vitamins-Minerals (ALIVE ONCE DAILY WOMENS 50+ PO) Take  1 tablet by mouth daily.   12/05/2017 at Unknown time  . omeprazole (PRILOSEC) 20 MG capsule Take 20 mg by mouth every morning.   unknown  . polycarbophil (FIBERCON) 625 MG tablet Take 625 mg by mouth 4 (four) times daily.   12/05/2017 at Unknown time  . potassium chloride SA (K-DUR,KLOR-CON) 20 MEQ tablet Take 1 tablet (20 mEq total) by mouth daily. (Patient taking differently: Take 20 mEq by mouth daily. 12/04/17 Patient reported Dr Karilyn Cota increased to 5 tablets three times a day.) 30 tablet 2 12/05/2017 at Unknown time  . Red Yeast Rice Extract (RED YEAST RICE PO) Take 2 tablets by mouth daily.    12/05/2017 at Unknown time  . simvastatin (ZOCOR) 40 MG tablet Take 40 mg by mouth every evening.    12/04/2017 at Unknown time  . torsemide (DEMADEX) 20 MG tablet Take 4 tablets (80 mg total) by mouth 2 (two) times daily. 240  tablet 0 12/05/2017 at Unknown time  . doxycycline (VIBRA-TABS) 100 MG tablet Take 1 tablet (100 mg total) by mouth every 12 (twelve) hours. (Patient not taking: Reported on 11/17/2017) 20 tablet 0 Completed Course at Unknown time  . spironolactone (ALDACTONE) 25 MG tablet Take 1 tablet (25 mg total) by mouth daily. (Patient not taking: Reported on 12/05/2017) 30 tablet 0 Not Taking at Unknown time    Assessment: 67 y.o.femalewith medical history significant ofCongestive heart failure with a EF of 25%, morbid obesity, AICD, diabetes, A. fib comes in with over a week of increased weight gain of over 27 pounds along with shortness of breath and swelling all over.  Patient found to be in CHF exacerbation. Pharmacy asked to dose lovenox for DVT px  Goal of Therapy:  Monitor platelets by anticoagulation protocol: Yes   Plan:  lovenox 0.5mg /kg (dose is 80mg ) sq q12h Monitor for s/s of bleeding/platelets and labs  Elder Cyphers, BS Loura Back, BCPS Clinical Pharmacist Pager [phone] 12/06/2017,3:58 PM

## 2017-12-06 NOTE — Care Management (Signed)
CM called [facility] to discuss patient. No one available to discuss. Will call again tomorrow.

## 2017-12-07 ENCOUNTER — Telehealth: Payer: Self-pay | Admitting: *Deleted

## 2017-12-07 ENCOUNTER — Encounter (HOSPITAL_COMMUNITY): Payer: Self-pay | Admitting: Family Medicine

## 2017-12-07 DIAGNOSIS — I5043 Acute on chronic combined systolic (congestive) and diastolic (congestive) heart failure: Secondary | ICD-10-CM

## 2017-12-07 DIAGNOSIS — I10 Essential (primary) hypertension: Secondary | ICD-10-CM

## 2017-12-07 DIAGNOSIS — E876 Hypokalemia: Secondary | ICD-10-CM

## 2017-12-07 DIAGNOSIS — I428 Other cardiomyopathies: Secondary | ICD-10-CM

## 2017-12-07 DIAGNOSIS — I471 Supraventricular tachycardia: Secondary | ICD-10-CM

## 2017-12-07 DIAGNOSIS — Z950 Presence of cardiac pacemaker: Secondary | ICD-10-CM

## 2017-12-07 DIAGNOSIS — R601 Generalized edema: Secondary | ICD-10-CM | POA: Diagnosis present

## 2017-12-07 DIAGNOSIS — E118 Type 2 diabetes mellitus with unspecified complications: Secondary | ICD-10-CM

## 2017-12-07 DIAGNOSIS — I509 Heart failure, unspecified: Secondary | ICD-10-CM

## 2017-12-07 DIAGNOSIS — I429 Cardiomyopathy, unspecified: Secondary | ICD-10-CM

## 2017-12-07 LAB — GLUCOSE, CAPILLARY
GLUCOSE-CAPILLARY: 146 mg/dL — AB (ref 65–99)
GLUCOSE-CAPILLARY: 115 mg/dL — AB (ref 65–99)
Glucose-Capillary: 83 mg/dL (ref 65–99)
Glucose-Capillary: 208 mg/dL — ABNORMAL HIGH (ref 65–99)

## 2017-12-07 LAB — BASIC METABOLIC PANEL
ANION GAP: 14 (ref 5–15)
BUN: 22 mg/dL — ABNORMAL HIGH (ref 6–20)
CO2: 33 mmol/L — ABNORMAL HIGH (ref 22–32)
Calcium: 8.6 mg/dL — ABNORMAL LOW (ref 8.9–10.3)
Chloride: 85 mmol/L — ABNORMAL LOW (ref 101–111)
Creatinine, Ser: 0.88 mg/dL (ref 0.44–1.00)
Glucose, Bld: 77 mg/dL (ref 65–99)
POTASSIUM: 2.4 mmol/L — AB (ref 3.5–5.1)
SODIUM: 132 mmol/L — AB (ref 135–145)

## 2017-12-07 LAB — CBC
HEMATOCRIT: 47.4 % — AB (ref 36.0–46.0)
HEMOGLOBIN: 15.1 g/dL — AB (ref 12.0–15.0)
MCH: 29.3 pg (ref 26.0–34.0)
MCHC: 31.9 g/dL (ref 30.0–36.0)
MCV: 91.9 fL (ref 78.0–100.0)
Platelets: 155 10*3/uL (ref 150–400)
RBC: 5.16 MIL/uL — AB (ref 3.87–5.11)
RDW: 17.2 % — ABNORMAL HIGH (ref 11.5–15.5)
WBC: 5.5 10*3/uL (ref 4.0–10.5)

## 2017-12-07 LAB — MAGNESIUM: MAGNESIUM: 1.8 mg/dL (ref 1.7–2.4)

## 2017-12-07 MED ORDER — POTASSIUM CHLORIDE CRYS ER 20 MEQ PO TBCR
40.0000 meq | EXTENDED_RELEASE_TABLET | Freq: Two times a day (BID) | ORAL | Status: DC
  Administered 2017-12-07 – 2017-12-10 (×6): 40 meq via ORAL
  Filled 2017-12-07 (×7): qty 2
  Filled 2017-12-07: qty 4

## 2017-12-07 MED ORDER — BISACODYL 5 MG PO TBEC: 5.0000 mg | DELAYED_RELEASE_TABLET | Freq: Every day | ORAL | Status: DC | PRN

## 2017-12-07 MED ORDER — DOCUSATE SODIUM 100 MG PO CAPS
100.0000 mg | ORAL_CAPSULE | Freq: Two times a day (BID) | ORAL | Status: DC
  Administered 2017-12-07 – 2017-12-25 (×35): 100 mg via ORAL
  Filled 2017-12-07 (×37): qty 1

## 2017-12-07 MED ORDER — FUROSEMIDE 10 MG/ML IJ SOLN
80.0000 mg | Freq: Two times a day (BID) | INTRAMUSCULAR | Status: DC
  Administered 2017-12-07 – 2017-12-11 (×8): 80 mg via INTRAVENOUS
  Filled 2017-12-07 (×8): qty 8

## 2017-12-07 MED ORDER — LISINOPRIL 5 MG PO TABS
2.5000 mg | ORAL_TABLET | Freq: Every day | ORAL | Status: DC
  Administered 2017-12-07 – 2017-12-25 (×17): 2.5 mg via ORAL
  Filled 2017-12-07 (×20): qty 1

## 2017-12-07 MED ORDER — FLEET ENEMA 7-19 GM/118ML RE ENEM: 1.0000 | ENEMA | Freq: Every day | RECTAL | Status: DC | PRN

## 2017-12-07 MED ORDER — CARVEDILOL 3.125 MG PO TABS
6.2500 mg | ORAL_TABLET | Freq: Two times a day (BID) | ORAL | Status: DC
  Administered 2017-12-07 – 2017-12-15 (×14): 6.25 mg via ORAL
  Filled 2017-12-07 (×16): qty 2

## 2017-12-07 MED ORDER — POLYETHYLENE GLYCOL 3350 17 G PO PACK
17.0000 g | PACK | Freq: Every day | ORAL | Status: DC | PRN
  Administered 2017-12-07: 17 g via ORAL
  Filled 2017-12-07: qty 1

## 2017-12-07 MED ORDER — POTASSIUM CHLORIDE CRYS ER 20 MEQ PO TBCR
60.0000 meq | EXTENDED_RELEASE_TABLET | Freq: Once | ORAL | Status: AC
  Administered 2017-12-07: 60 meq via ORAL
  Filled 2017-12-07: qty 3

## 2017-12-07 MED ORDER — MAGNESIUM SULFATE 2 GM/50ML IV SOLN
2.0000 g | Freq: Once | INTRAVENOUS | Status: AC
  Administered 2017-12-07: 2 g via INTRAVENOUS
  Filled 2017-12-07: qty 50

## 2017-12-07 NOTE — Consult Note (Addendum)
Cardiology Consult    Patient ID: Donna Graves; [id number]; 01-Jan-1951   Admit date: 12/05/2017 Date of Consult: 12/07/2017  Primary Care Provider: Wilson Singer, MD Primary Cardiologist: Dina Rich, MD    Patient Profile    Donna Graves is a 67 y.o. female with past medical history of chronic combined systolic and diastolic CHF (EF 78-29% by echo with cath in 2014 showing nonobstructive CAD), nonischemic cardiomyopathy (s/p BiV ICD placement in 05/2017 - unable to have LV lead placed), PAT, HTN, HLD, Type 2 DM, and morbid obesity who is being seen today for the evaluation of CHF at the request of Dr. Laural Benes.    History of Present Illness    Donna Graves was recently admitted in 10/2017 with acute CHF exacerbation and diuresed over 25L with a discharge weight of 315 lbs. At the time of her follow-up visit with Dr. Wyline Mood on 11/13/2017, weight had increased to 322 lbs but she was continued on Torsemide 80mg  BID and Metolazone 5mg  twice weekly with Lisinopril being restarted at 2.5mg  BID and Coreg being reduced to 6.25mg  BID.   She called the office on 12/04/2017 reporting her weight had increased to 343 lbs and weight declined by 7 lbs after taking Metolazone. She presented to the ED on 3/19 when worsening fluid retention along her abdomen and upper and lower extremities. Says weight has increased over 20 pounds in the past 2 weeks despite compliance with her medication regimen. Has noticed worsening orthopnea. Denies any dyspnea on exertion, chest pain, palpitations, dizziness, or presyncope.  Initial labs showed WBC 5.3, Hgb 15.4, platelets 155, Na+ 133, K+ 3.0, and creatinine 1.12. Initial BNP at 400. Cyclic troponin values have been flat at 0.04, 0.05, and 0.06. CXR shows pulmonary vascular congestion without edema or consolidation. EKG shows V-paced rhythm, HR 79, with PVC's.  She was admitted for acute CHF exacerbation and is been started on IV Lasix 40 mg twice daily.  Overall net output of -2.9L thus far. Weight inaccurate as this was recorded at 155 lbs this AM (at 339 lbs on 3/20). Creatinine has improved to 0.88. She is significantly hypokalemic with a potassium of 2.4.  Past Medical History:  Diagnosis Date  . Atrial fibrillation and flutter (HCC)    History of RFA and ultimately AV node ablation  . Cardiomyopathy (HCC)    a. LVEF 25-30% by echo in 04/2017.  Marland Kitchen CHF (congestive heart failure) (HCC)   . Chronic systolic heart failure (HCC)   . Depression   . GERD (gastroesophageal reflux disease)   . Gout   . History of cardiac catheterization 07/23/2013   RA 17; RV 43/20 PCWP 27.  LAD normal  LCx  norma  RCA normal.  LVEF 55%  . Hyperlipidemia   . Hypertension   . Hypothyroidism   . 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 Ladona Ridgel  . Type 2 diabetes mellitus (HCC)     Past Surgical History:  Procedure Laterality Date  . ANAL FISSURE REPAIR    . BACK SURGERY    . BIV UPGRADE N/A 05/23/2017   Procedure: BiVI Upgrade;  Surgeon: Marinus Maw, MD;  Location: [facility] INVASIVE CV LAB;  Service: Cardiovascular;  Laterality: N/A;  . ICD IMPLANT  05/23/2017   biv  . Left toe surgery       Home Medications:  Prior to Admission medications   Medication Sig Start Date End Date Taking? Authorizing Provider  acetaminophen (TYLENOL) 650 MG CR tablet Take 650-1,300 mg by mouth every 8 (eight) hours as needed for pain.   Yes [provider]  acidophilus (RISAQUAD) CAPS capsule Take 1 capsule by mouth daily. ULTIMATE FLORA PROBIOTIC   Yes [provider]  ARMOUR THYROID 15 MG tablet Take 1 tablet by mouth daily. 10/05/17  Yes [provider]  aspirin EC 81 MG EC tablet Take 1 tablet (81 mg total) by mouth daily. 10/31/17  Yes Philip Aspen, Limmie Patricia, MD  carvedilol (COREG) 6.25 MG tablet Take 1 tablet (6.25 mg total) by mouth 2 (two) times daily with a meal.  11/13/17  Yes Branch, Dorothe Pea, MD  Cholecalciferol (VITAMIN D PO) Take 7,000 Units by mouth daily.   Yes [provider]  dicyclomine (BENTYL) 10 MG capsule Take 10 mg by mouth 3 (three) times daily as needed for spasms.   Yes [provider]  glimepiride (AMARYL) 2 MG tablet Take 4 mg by mouth daily. BEFORE A MEAL   Yes [provider]  lisinopril (PRINIVIL,ZESTRIL) 2.5 MG tablet Take 1 tablet (2.5 mg total) by mouth daily. 11/13/17 02/11/18 Yes BranchDorothe Pea, MD  magnesium oxide (MAG-OX) 400 MG tablet Take 400 mg by mouth daily.   Yes [provider]  metolazone (ZAROXOLYN) 5 MG tablet Take 5 mg by mouth. 12/04/17 Per patient, PCP ordered Metolazone 1 tablet twice a week.   Yes Wilson Singer, MD  Misc Natural Products (BLACK CHERRY CONCENTRATE PO) Take 2 tablets by mouth daily.    Yes [provider]  Multiple Vitamins-Minerals (ALIVE ONCE DAILY WOMENS 50+ PO) Take 1 tablet by mouth daily.   Yes [provider]  omeprazole (PRILOSEC) 20 MG capsule Take 20 mg by mouth every morning.   Yes [provider]  polycarbophil (FIBERCON) 625 MG tablet Take 625 mg by mouth 4 (four) times daily.   Yes [provider]  potassium chloride SA (K-DUR,KLOR-CON) 20 MEQ tablet Take 1 tablet (20 mEq total) by mouth daily. Patient taking differently: Take 20 mEq by mouth daily. 12/04/17 Patient reported Dr Karilyn Cota increased to 5 tablets three times a day. 10/31/17  Yes Philip Aspen, Limmie Patricia, MD  Red Yeast Rice Extract (RED YEAST RICE PO) Take 2 tablets by mouth daily.    Yes [provider]  simvastatin (ZOCOR) 40 MG tablet Take 40 mg by mouth every evening.    Yes [provider]  torsemide (DEMADEX) 20 MG tablet Take 4 tablets (80 mg total) by mouth 2 (two) times daily. 10/21/17  Yes Tat, Onalee Hua, MD  doxycycline (VIBRA-TABS) 100 MG tablet Take 1 tablet (100 mg total) by mouth every 12 (twelve) hours. Patient not  taking: Reported on 11/17/2017 10/30/17   Philip Aspen, Limmie Patricia, MD  spironolactone (ALDACTONE) 25 MG tablet Take 1 tablet (25 mg total) by mouth daily. Patient not taking: Reported on 12/05/2017 10/22/17   Catarina Hartshorn, MD    Inpatient Medications: Scheduled Meds: . aspirin EC  81 mg Oral Daily  . enoxaparin (LOVENOX) injection  80 mg Subcutaneous Q24H  . furosemide  40 mg Intravenous BID  . insulin aspart  0-9 Units Subcutaneous TID WC  . metolazone  5 mg Oral Daily  . potassium chloride  20 mEq Oral BID  . sodium chloride flush  3 mL Intravenous Q12H   Continuous Infusions: . sodium chloride     PRN Meds: sodium chloride, acetaminophen, ondansetron (ZOFRAN) IV, sodium chloride flush  Allergies:  Allergies  Allergen Reactions  . Codeine Rash    Social History:   Social History   Socioeconomic History  . Marital status: Married    Spouse name: Not on file  . Number of children: Not on file  . Years of education: Not on file  . Highest education level: Not on file  Occupational History  . Not on file  Social Needs  . Financial resource strain: Not on file  . Food insecurity:    Worry: Not on file    Inability: Not on file  . Transportation needs:    Medical: Not on file    Non-medical: Not on file  Tobacco Use  . Smoking status: Never Smoker  . Smokeless tobacco: Never Used  Substance and Sexual Activity  . Alcohol use: No    Alcohol/week: 0.0 oz  . Drug use: No  . Sexual activity: Not Currently    Birth control/protection: None  Lifestyle  . Physical activity:    Days per week: Not on file    Minutes per session: Not on file  . Stress: Not on file  Relationships  . Social connections:    Talks on phone: Not on file    Gets together: Not on file    Attends religious service: Not on file    Active member of club or organization: Not on file    Attends meetings of clubs or organizations: Not on file    Relationship status: Not on file  . Intimate  partner violence:    Fear of current or ex partner: Not on file    Emotionally abused: Not on file    Physically abused: Not on file    Forced sexual activity: Not on file  Other Topics Concern  . Not on file  Social History Narrative  . Not on file     Family History:    Family History  Problem Relation Age of Onset  . Diabetes Mother 39  . Heart attack Mother   . Hypertension Mother   . Diabetes Brother 18  . Arthritis Father 6      Review of Systems    General:  No chills, fever, night sweats or weight changes.  Cardiovascular:  No chest pain, dyspnea on exertion, palpitations, paroxysmal nocturnal dyspnea. Positive for edema and orthopnea.  Dermatological: No rash, lesions/masses Respiratory: No cough, dyspnea Urologic: No hematuria, dysuria Abdominal:   No nausea, vomiting, diarrhea, bright red blood per rectum, melena, or hematemesis. Positive for abdominal distension.  Neurologic:  No visual changes, wkns, changes in mental status. All other systems reviewed and are otherwise negative except as noted above.  Physical Exam/Data    Vitals:   12/06/17 0551 12/06/17 1400 12/06/17 2100 12/07/17 0320  BP: 118/74 (!) 93/57 (!) 109/50 100/85  Pulse: 78 81 76 73  Resp: 20 20 20 20   Temp: 98 F (36.7 C) 97.8 F (36.6 C) 98.9 F (37.2 C) 98.2 F (36.8 C)  TempSrc: Oral Oral Oral Oral  SpO2: 94% 94% 94% 92%  Weight: (!) 339 lb 11.7 oz (154.1 kg)   155 lb 11.2 oz (70.6 kg)  Height:        Intake/Output Summary (Last 24 hours) at 12/07/2017 1021 Last data filed at 12/07/2017 1015 Gross per 24 hour  Intake 1120 ml  Output 1900 ml  Net -780 ml   Filed Weights   12/05/17 2116 12/06/17 0551 12/07/17 0320  Weight: (!) 339 lb 8.1 oz (154 kg) (!) 339 lb  11.7 oz (154.1 kg) 155 lb 11.2 oz (70.6 kg)   Body mass index is 26.73 kg/m.   General: Pleasant morbidly obese Caucasian female appearing in NAD Psych: Normal affect. Neuro: Alert and oriented X 3. Moves all  extremities spontaneously. HEENT: Normal  Neck: Supple without bruits. JVD unable to be assessed secondary to body habitus . Lungs:  Resp regular and unlabored, decreased breath sounds along bases bilaterally. Heart: RRR no s3, s4, or murmurs. Abdomen: Soft, non-tender, BS + x 4. Appears distended. Extremities: No clubbing or cyanosis. 2+ pitting edema bilaterally. DP/PT/Radials 2+ and equal bilaterally.   EKG:  The EKG was personally reviewed and demonstrates:   V-paced rhythm, HR 79, with PVC's.    Labs/Studies     Relevant CV Studies:  Limited Echo: 10/17/2017 Study Conclusions  - Left ventricle: The estimated ejection fraction was 25%. - Ventricular septum: Septal motion showed abnormal function and   dyssynergy. - Mitral valve: Mildly calcified annulus. - Right ventricle: The cavity size was mildly dilated. Systolic   function was moderately reduced.  Impressions:  - Limited study with Definity. Images are still quite limited. LVEF   estimated at approximately 25% based on the most consistent   views. Cannot specifically comment on focal wall motion although   there does look to be septal dyssynergy. There is also moderate   right ventricular dysfunction.  Laboratory Data:  Chemistry Recent Labs  Lab 12/05/17 1422 12/06/17 0241 12/07/17 0620  NA 133* 135 132*  K 3.0* 2.3* 2.4*  CL 84* 85* 85*  CO2 34* 36* 33*  GLUCOSE 124* 89 77  BUN 25* 22* 22*  CREATININE 1.12* 0.96 0.88  CALCIUM 9.0 8.7* 8.6*  GFRNONAA 50* >60 >60  GFRAA 58* >60 >60  ANIONGAP 15 14 14     No results for input(s): PROT, ALBUMIN, AST, ALT, ALKPHOS, BILITOT in the last 168 hours. Hematology Recent Labs  Lab 12/05/17 1422 12/07/17 0620  WBC 5.3 5.5  RBC 5.24* 5.16*  HGB 15.4* 15.1*  HCT 48.6* 47.4*  MCV 92.7 91.9  MCH 29.4 29.3  MCHC 31.7 31.9  RDW 17.0* 17.2*  PLT 155 155   Cardiac Enzymes Recent Labs  Lab 12/05/17 2032 12/06/17 0241 12/06/17 0851  TROPONINI 0.04*  0.05* 0.06*   No results for input(s): TROPIPOC in the last 168 hours.  BNP Recent Labs  Lab 12/05/17 1422  BNP 400.0*    DDimer No results for input(s): DDIMER in the last 168 hours.  Radiology/Studies:  Dg Chest 2 View  Result Date: 12/05/2017 CLINICAL DATA:  Shortness of Breath EXAM: CHEST - 2 VIEW COMPARISON:  November 17, 2017 FINDINGS: There is no edema or consolidation. There is cardiomegaly with pulmonary venous hypertension. Pacemaker leads are attached to the right atrium, right ventricle, and coronary sinus. No adenopathy. No bone lesions. IMPRESSION: Pulmonary vascular congestion without edema or consolidation. Pacemaker leads as noted. Electronically Signed   By: Bretta Bang III M.D.   On: 12/05/2017 13:52     Assessment & Plan    1. Acute on Chronic Combined systolic and diastolic CHF - the patient has a known reduced EF of 25% by most recent echo in 09/2017. Presents with worsening fluid retention along her upper and lower extremities along with abdomen. Notes associated orthopnea. Reports compliance with Torsemide 80mg  BID and Metolazone twice weekly prior to admission. Says she has been compliant with a low-sodium diet.  -  Initial BNP at 400. Cyclic troponin values have been flat at  0.04, 0.05, and 0.06. She has been started on IV Lasix 40 mg twice daily with Metolazone 5mg  daily. Overall net output of -2.9L thus far and creatinine has remained stable. Would further titrate Lasix to 80 mg twice daily with repeat BMET in AM as she is still significant volume overloaded. Monitor I&O's along with daily weights. Will hold further Metolazone for now pending repeat BMET in the setting of her significant hypokalemia.  - PTA BB and ACE-I currently held due to BP.   2. Nonischemic cardiomyopathy - s/p dual chamber Medtronic ICD placement in 05/2017 - unable to have LV lead placed at that time as outlined by prior notes. Followed by Dr. Ladona Ridgel as an outpatient. Interrogation in  08/2017 showed normal device function.   3. Paroxysmal Atrial Tachycardia - maintaining NSR at this time. Mg 1.8. K+ 2.4 and replacement has been ordered.  - PTA Coreg held secondary to soft BP. Plan to restart prior to discharge as BP allows.   4. HTN - BP has been soft at 93/50 to 109/85 within the past 24 hours in the setting of diuresis. - Continue to follow. PTA Coreg and Lisinopril currently held.   5. Morbid Obesity - BMI greater than 58. Is being followed by [facility] and is planning to undergo bariatric surgery in the near future.    For questions or updates, please contact [facility] Please consult [email or web address] for contact info under Cardiology/STEMI.  Signed, Ellsworth Lennox, PA-C 12/07/2017, 10:21 AM Pager: [phone]  The patient was seen and examined, and I agree with the history, physical exam, assessment and plan as documented above, with modifications as noted below. I have also personally reviewed all relevant documentation, old records, labs, and both radiographic and cardiovascular studies. I have also independently interpreted old and new ECG's.  Briefly, this is a 67 year old woman who was initially evaluated by Dr. Tenny Craw and subsequently with Dr. Ladona Ridgel and Dr. Wyline Mood most recently.    She has a history of chronic systolic heart failure with a BiV upgrade in September 2018 at which time a left ventricular lead was not able to be placed.    She has a nonischemic cardiomyopathy with coronary angiogram in 2014 demonstrating nonobstructive disease, hypertension, hyperlipidemia, type 2 diabetes mellitus, and morbid obesity.    She also has a history of atrial tachycardia and possibly atrial flutter ablation.  She was recently hospitalized in February with acute decompensated heart failure and diuresed over 25 L.  She followed up with Dr. Wyline Mood on 11/13/17.  She has been on torsemide 80 mg twice daily and metolazone 5 mg twice weekly.  I spoke  to her husband and the patient at length.  She maintains that she takes her medications as prescribed and has not missed any doses including carvedilol and lisinopril.  She adheres to a low-sodium diet.  Her husband does all the cooking and he cooks with fresh vegetables and avoids canned foods.  He rinses anything that potentially has sodium.  She began noticing increasing abdominal girth, exertional dyspnea, orthopnea, and bilateral leg swelling about 2 weeks ago.  She has been started on IV Lasix 40 mg twice daily.  She is markedly hypokalemic.  Renal function has remained stable.  She has diuresed nearly 3 L.  Recorded weights are inaccurate.  Carvedilol and lisinopril were held at the time of admission due to soft blood pressures.  I agree with increasing IV Lasix to 80 mg twice daily and holding metolazone given  her marked hypokalemia.  I would try to resume carvedilol and lisinopril as blood pressure permits as this will improve intracardiac hemodynamics even with systolic readings in the 90 range.  We will continue to follow along with you.   Prentice Docker, MD, [facility]  12/07/2017 11:19 AM

## 2017-12-07 NOTE — Progress Notes (Addendum)
PROGRESS NOTE    Donna Graves  WYB:[id number]  DOB: [birth year]  DOA: 12/05/2017 PCP: Wilson Singer, MD   Brief Admission Hx: Donna Graves is a 67 y.o. female with medical history significant of Congestive heart failure with a EF of 25%, morbid obesity, AICD, diabetes, A. fib comes in with over a week of increased weight gain of over 27 pounds along with shortness of breath and swelling all over.  Patient denies any fevers or cough.  Patient found to be in CHF exacerbation and referred for admission for such.  Her O2 sats are normal on room air but she is grossly volume overloaded.  She denies any chest pain.  MDM/Assessment & Plan:   1. Acute on chronic systolic heart failure exacerbation - I strongly suspect secondary to noncompliance with diet and medications.  Pt is grossly fluid overloaded.  Will continue IV diuresis with lasix increasing to 80 mg BID as BP and renal function allow.  Pt requests to see cardiology team as she is scheduled to see them on Monday in office.  Monitor I/Os, weights closely. Continue metolazone.  Monitor telemetry and monitor electrolytes.  Pt had a recent echo last admission in Jan 2019.  2. Paroxysmal atrial fibrillation - stable, continue aspirin. 3. Essential Hypertension - restarting coreg and ACEI with holding parameters as BP allows.   Echocardiogram 10/17/17 Study Conclusions  - Left ventricle: The estimated ejection fraction was 25%. - Ventricular septum: Septal motion showed abnormal function and dyssynergy. - Mitral valve: Mildly calcified annulus. - Right ventricle: The cavity size was mildly dilated. Systolic function was moderately reduced.  Impressions:  - Limited study with Definity. Images are still quite limited. LVEF estimated at approximately 25% based on the most consistent views. Cannot specifically comment on focal wall motion although   there does look to be septal dyssynergy. There is also moderate right ventricular  dysfunction.  DVT prophylaxis: lovenox Code Status: Full  Family Communication: patient Disposition Plan: continued IV diuresis needed  Subjective: Pt says that she is not urinating a lot like she would expect after her previous admissions  Objective: Vitals:   12/06/17 2100 12/07/17 0320 12/07/17 1007 12/07/17 1412  BP: (!) 109/50 100/85  (!) 145/57  Pulse: 76 73  75  Resp: 20 20  19   Temp: 98.9 F (37.2 C) 98.2 F (36.8 C)  98.5 F (36.9 C)  TempSrc: Oral Oral  Oral  SpO2: 94% 92% 93% 93%  Weight:  70.6 kg (155 lb 11.2 oz)    Height:        Intake/Output Summary (Last 24 hours) at 12/07/2017 1452 Last data filed at 12/07/2017 1100 Gross per 24 hour  Intake 930 ml  Output 1525 ml  Net -595 ml   Filed Weights   12/05/17 2116 12/06/17 0551 12/07/17 0320  Weight: (!) 154 kg (339 lb 8.1 oz) (!) 154.1 kg (339 lb 11.7 oz) 70.6 kg (155 lb 11.2 oz)   REVIEW OF SYSTEMS  As per history otherwise all reviewed and reported negative  Exam:  General exam: morbidly obese, sitting up in bed, NAD, cooperative.  Respiratory system: shallow but clear. No increased work of breathing. Cardiovascular system: S1 & S2 heard, RRR. No JVD, murmurs, gallops, clicks or pedal edema. Gastrointestinal system: Abdomen severely distended with fluid waves soft and nontender. Normal bowel sounds heard. Central nervous system: Alert and oriented. No focal neurological deficits. Extremities: 2+ bilateral pitting edema.  Data Reviewed: Basic Metabolic Panel: Recent Labs  Lab 12/05/17 1422 12/06/17 0241 12/06/17 0400 12/07/17 0620  NA 133* 135  --  132*  K 3.0* 2.3*  --  2.4*  CL 84* 85*  --  85*  CO2 34* 36*  --  33*  GLUCOSE 124* 89  --  77  BUN 25* 22*  --  22*  CREATININE 1.12* 0.96  --  0.88  CALCIUM 9.0 8.7*  --  8.6*  MG  --   --  1.8 1.8  PHOS  --   --  4.0  --    Liver Function Tests: No results for input(s): AST, ALT, ALKPHOS, BILITOT, PROT, ALBUMIN in the last 168 hours. No  results for input(s): LIPASE, AMYLASE in the last 168 hours. No results for input(s): AMMONIA in the last 168 hours. CBC: Recent Labs  Lab 12/05/17 1422 12/07/17 0620  WBC 5.3 5.5  HGB 15.4* 15.1*  HCT 48.6* 47.4*  MCV 92.7 91.9  PLT 155 155   Cardiac Enzymes: Recent Labs  Lab 12/05/17 2032 12/06/17 0241 12/06/17 0851  TROPONINI 0.04* 0.05* 0.06*   CBG (last 3)  Recent Labs    12/06/17 2146 12/07/17 0740 12/07/17 1107  GLUCAP 91 83 146*   No results found for this or any previous visit (from the past 240 hour(s)).   Studies: No results found. Scheduled Meds: . aspirin EC  81 mg Oral Daily  . enoxaparin (LOVENOX) injection  80 mg Subcutaneous Q24H  . furosemide  80 mg Intravenous BID  . insulin aspart  0-9 Units Subcutaneous TID WC  . potassium chloride  20 mEq Oral BID  . sodium chloride flush  3 mL Intravenous Q12H   Continuous Infusions: . sodium chloride      Principal Problem:   Acute on chronic congestive heart failure (HCC) Active Problems:   Type II diabetes mellitus (HCC)   AF (paroxysmal atrial fibrillation) (HCC)   Essential hypertension   Morbid obesity (HCC)   CHF (congestive heart failure) (HCC)  Time spent:   Standley Dakins, MD, FAAFP [facility] Pager [phone]  If 7PM-7AM, please contact night-coverage [email or web address] Password [email or web address] 12/07/2017, 2:52 PM    LOS: 2 days

## 2017-12-07 NOTE — Telephone Encounter (Signed)
Completed preoperative clearance faxed to [facility] at [phone].

## 2017-12-07 NOTE — Evaluation (Signed)
Physical Therapy Evaluation Patient Details Name: Donna Graves MRN: [id number] DOB: [birth year] Today's Date: 12/07/2017   History of Present Illness  67 y.o. female was admitted with acute CHF and 27# weight gain with reduction in K+, had K+ repleted and is receiving lasix for edema.  EF 25%, PMHx:  PAF, HTN, morbid obesity, CHF, cellulitis panniculus, nonischemic cardiomyopathy   Clinical Impression  Pt is demonstrating some difficulty with tolerating gait and will benefit from endurance training and balance work to transition to home therapy.  Husband is present to observe and help with follow up care then on to HHPT.  See acutely for followup of all therapy goals to increase safety with home transition.    Follow Up Recommendations Home health PT;Supervision for mobility/OOB    Equipment Recommendations  None recommended by PT    Recommendations for Other Services       Precautions / Restrictions Precautions Precautions: Fall(telemetry) Restrictions Weight Bearing Restrictions: No      Mobility  Bed Mobility Overal bed mobility: Needs Assistance Bed Mobility: Supine to Sit;Sit to Supine     Supine to sit: Min guard Sit to supine: Min guard      Transfers Overall transfer level: Needs assistance Equipment used: Rolling walker (2 wheeled);1 person hand held assist Transfers: Sit to/from Stand Sit to Stand: Min assist         General transfer comment: reminders for hand placement  Ambulation/Gait Ambulation/Gait assistance: Min guard;Min assist Ambulation Distance (Feet): 75 Feet Assistive device: Rolling walker (2 wheeled);1 person hand held assist Gait Pattern/deviations: Step-through pattern;Decreased stride length;Wide base of support;Drifts right/left Gait velocity: reduced Gait velocity interpretation: Below normal speed for age/gender    Stairs            Wheelchair Mobility    Modified Rankin (Stroke Patients Only)       Balance  Overall balance assessment: Needs assistance Sitting-balance support: Feet supported Sitting balance-Leahy Scale: Good     Standing balance support: Bilateral upper extremity supported;During functional activity Standing balance-Leahy Scale: Fair                               Pertinent Vitals/Pain Pain Assessment: No/denies pain    Home Living Family/patient expects to be discharged to:: Private residence Living Arrangements: Spouse/significant other Available Help at Discharge: Family Type of Home: House Home Access: Stairs to enter   [employer] of Steps: 1 step to get in  Home Layout: One level Home Equipment: Environmental consultant - 2 wheels;Cane - single point      Prior Function Level of Independence: Independent with assistive device(s)         Comments: RW recently but has SPC     Hand Dominance        Extremity/Trunk Assessment   Upper Extremity Assessment Upper Extremity Assessment: Overall WFL for tasks assessed    Lower Extremity Assessment Lower Extremity Assessment: Overall WFL for tasks assessed    Cervical / Trunk Assessment Cervical / Trunk Assessment: Normal  Communication   Communication: No difficulties  Cognition Arousal/Alertness: Awake/alert Behavior During Therapy: WFL for tasks assessed/performed Overall Cognitive Status: Within Functional Limits for tasks assessed                                        General Comments      Exercises  Assessment/Plan    PT Assessment Patient needs continued PT services  PT Problem List Decreased range of motion;Decreased balance;Decreased activity tolerance;Decreased mobility;Decreased coordination;Decreased knowledge of use of DME;Decreased safety awareness;Cardiopulmonary status limiting activity;Obesity;Decreased skin integrity       PT Treatment Interventions DME instruction;Gait training;Stair training;Functional mobility training;Therapeutic  activities;Therapeutic exercise;Balance training;Neuromuscular re-education;Patient/family education    PT Goals (Current goals can be found in the Care Plan section)  Acute Rehab PT Goals Patient Stated Goal: to walk safely PT Goal Formulation: With patient/family Time For Goal Achievement: 12/21/17 Potential to Achieve Goals: Good    Frequency Min 3X/week   Barriers to discharge Inaccessible home environment 1 step to get in    Co-evaluation               AM-PAC PT "6 Clicks" Daily Activity  Outcome Measure Difficulty turning over in bed (including adjusting bedclothes, sheets and blankets)?: A Little Difficulty moving from lying on back to sitting on the side of the bed? : A Little Difficulty sitting down on and standing up from a chair with arms (e.g., wheelchair, bedside commode, etc,.)?: A Little Help needed moving to and from a bed to chair (including a wheelchair)?: A Little Help needed walking in hospital room?: A Little Help needed climbing 3-5 steps with a railing? : A Lot 6 Click Score: 17    End of Session Equipment Utilized During Treatment: Gait belt Activity Tolerance: Patient tolerated treatment well Patient left: in bed;with call bell/phone within reach;with family/visitor present Nurse Communication: Mobility status PT Visit Diagnosis: Unsteadiness on feet (R26.81);Other abnormalities of gait and mobility (R26.89)    Time: 1610-9604 PT Time Calculation (min) (ACUTE ONLY): 25 min   Charges:   PT Evaluation $PT Eval Moderate Complexity: 1 Mod PT Treatments $Gait Training: 8-22 mins   PT G Codes:   PT G-Codes **NOT FOR INPATIENT CLASS** Functional Assessment Tool Used: AM-PAC 6 Clicks Basic Mobility    Ivar Drape 12/07/2017, 9:45 PM   Samul Dada, PT MS Acute Rehab Dept. Number: [facility] [id number] and [facility] [phone]

## 2017-12-08 ENCOUNTER — Other Ambulatory Visit: Payer: Self-pay

## 2017-12-08 DIAGNOSIS — I429 Cardiomyopathy, unspecified: Secondary | ICD-10-CM

## 2017-12-08 LAB — GLUCOSE, CAPILLARY
GLUCOSE-CAPILLARY: 215 mg/dL — AB (ref 65–99)
Glucose-Capillary: 103 mg/dL — ABNORMAL HIGH (ref 65–99)
Glucose-Capillary: 164 mg/dL — ABNORMAL HIGH (ref 65–99)
Glucose-Capillary: 114 mg/dL — ABNORMAL HIGH (ref 65–99)

## 2017-12-08 LAB — BASIC METABOLIC PANEL
Anion gap: 12 (ref 5–15)
BUN: 22 mg/dL — AB (ref 6–20)
CHLORIDE: 87 mmol/L — AB (ref 101–111)
CO2: 32 mmol/L (ref 22–32)
Calcium: 8.7 mg/dL — ABNORMAL LOW (ref 8.9–10.3)
Creatinine, Ser: 0.93 mg/dL (ref 0.44–1.00)
GFR calc Af Amer: >60 mL/min (ref >60)
GFR calc non Af Amer: >60 mL/min (ref >60)
Glucose, Bld: 104 mg/dL — ABNORMAL HIGH (ref 65–99)
Potassium: 2.7 mmol/L — CL (ref 3.5–5.1)
SODIUM: 131 mmol/L — AB (ref 135–145)

## 2017-12-08 LAB — MAGNESIUM: MAGNESIUM: 1.8 mg/dL (ref 1.7–2.4)

## 2017-12-08 MED ORDER — SPIRONOLACTONE 25 MG PO TABS
25.0000 mg | ORAL_TABLET | Freq: Every day | ORAL | Status: DC
  Administered 2017-12-08 – 2017-12-14 (×7): 25 mg via ORAL
  Filled 2017-12-08 (×7): qty 1

## 2017-12-08 MED ORDER — FENTANYL CITRATE (PF) 100 MCG/2ML IJ SOLN
25.0000 ug | Freq: Once | INTRAMUSCULAR | Status: AC
  Administered 2017-12-08: 25 ug via INTRAVENOUS
  Filled 2017-12-08: qty 2

## 2017-12-08 MED ORDER — POTASSIUM CHLORIDE CRYS ER 20 MEQ PO TBCR
40.0000 meq | EXTENDED_RELEASE_TABLET | ORAL | Status: AC
  Administered 2017-12-08 (×2): 40 meq via ORAL
  Filled 2017-12-08: qty 2

## 2017-12-08 NOTE — Progress Notes (Signed)
Pt c/o chest pain. Non radiating. Began while sitting on side of bed. Pt noted to have prior episodes of this same described pain during this admit. Dr. Kerry Hough paged and made aware of 12 lead EKG results.

## 2017-12-08 NOTE — Progress Notes (Signed)
ANTICOAGULATION CONSULT NOTE   Pharmacy Consult for lovenox Indication: VTE prophylaxis  Allergies  Allergen Reactions  . Codeine Rash   Patient Measurements: Height: 5\' 4"  (162.6 cm) Weight: (!) 337 lb 11.2 oz (153.2 kg) IBW/kg (Calculated) : 54.7  Vital Signs: Temp: 98 F (36.7 C) (03/22 0519) Temp Source: Oral (03/22 0519) BP: 93/46 (03/22 0519) Pulse Rate: 69 (03/22 0519)  Labs: Recent Labs    12/05/17 1422 12/05/17 2032 12/06/17 0241 12/06/17 0851 12/07/17 0620 12/08/17 0536  HGB 15.4*  --   --   --  15.1*  --   HCT 48.6*  --   --   --  47.4*  --   PLT 155  --   --   --  155  --   CREATININE 1.12*  --  0.96  --  0.88 0.93  TROPONINI  --  0.04* 0.05* 0.06*  --   --    Estimated Creatinine Clearance: 89.6 mL/min (by C-G formula based on SCr of 0.93 mg/dL).  Medical History: Past Medical History:  Diagnosis Date  . Atrial fibrillation and flutter (HCC)    History of RFA and ultimately AV node ablation  . Cardiomyopathy (HCC)    a. LVEF 25-30% by echo in 04/2017.  Marland Kitchen CHF (congestive heart failure) (HCC)   . Chronic systolic heart failure (HCC)   . Depression   . GERD (gastroesophageal reflux disease)   . Gout   . History of cardiac catheterization 07/23/2013   RA 17; RV 43/20 PCWP 27.  LAD normal  LCx  norma  RCA normal.  LVEF 55%  . Hyperlipidemia   . Hypertension   . Hypothyroidism   . 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 Ladona Ridgel  . Type 2 diabetes mellitus (HCC)    Medications:  Medications Prior to Admission  Medication Sig Dispense Refill Last Dose  . acetaminophen (TYLENOL) 650 MG CR tablet Take 650-1,300 mg by mouth every 8 (eight) hours as needed for pain.   12/04/2017 at Unknown time  . acidophilus (RISAQUAD) CAPS capsule Take 1 capsule by mouth daily. ULTIMATE FLORA PROBIOTIC   12/05/2017 at Unknown time  . ARMOUR THYROID 15 MG tablet Take 1 tablet by mouth daily.  2  12/05/2017 at Unknown time  . aspirin EC 81 MG EC tablet Take 1 tablet (81 mg total) by mouth daily.   12/05/2017 at Unknown time  . carvedilol (COREG) 6.25 MG tablet Take 1 tablet (6.25 mg total) by mouth 2 (two) times daily with a meal. 180 tablet 1 12/05/2017 at 0800  . Cholecalciferol (VITAMIN D PO) Take 7,000 Units by mouth daily.   12/05/2017 at Unknown time  . dicyclomine (BENTYL) 10 MG capsule Take 10 mg by mouth 3 (three) times daily as needed for spasms.   12/05/2017 at Unknown time  . glimepiride (AMARYL) 2 MG tablet Take 4 mg by mouth daily. BEFORE A MEAL   12/05/2017 at Unknown time  . lisinopril (PRINIVIL,ZESTRIL) 2.5 MG tablet Take 1 tablet (2.5 mg total) by mouth daily. 90 tablet 1 12/05/2017 at Unknown time  . magnesium oxide (MAG-OX) 400 MG tablet Take 400 mg by mouth daily.   12/05/2017 at Unknown time  . metolazone (ZAROXOLYN) 5 MG tablet Take 5 mg by mouth. 12/04/17 Per patient, PCP ordered Metolazone 1 tablet twice a week.   12/05/2017 at Unknown time  . Misc Natural Products (BLACK CHERRY CONCENTRATE PO) Take 2 tablets by mouth daily.  12/05/2017 at Unknown time  . Multiple Vitamins-Minerals (ALIVE ONCE DAILY WOMENS 50+ PO) Take 1 tablet by mouth daily.   12/05/2017 at Unknown time  . omeprazole (PRILOSEC) 20 MG capsule Take 20 mg by mouth every morning.   unknown  . polycarbophil (FIBERCON) 625 MG tablet Take 625 mg by mouth 4 (four) times daily.   12/05/2017 at Unknown time  . potassium chloride SA (K-DUR,KLOR-CON) 20 MEQ tablet Take 1 tablet (20 mEq total) by mouth daily. (Patient taking differently: Take 20 mEq by mouth daily. 12/04/17 Patient reported Dr Karilyn Cota increased to 5 tablets three times a day.) 30 tablet 2 12/05/2017 at Unknown time  . Red Yeast Rice Extract (RED YEAST RICE PO) Take 2 tablets by mouth daily.    12/05/2017 at Unknown time  . simvastatin (ZOCOR) 40 MG tablet Take 40 mg by mouth every evening.    12/04/2017 at Unknown time  . torsemide (DEMADEX) 20 MG tablet Take  4 tablets (80 mg total) by mouth 2 (two) times daily. 240 tablet 0 12/05/2017 at Unknown time   Assessment: 67 y.o.femalewith medical history significant ofCongestive heart failure with a EF of 25%, morbid obesity, AICD, diabetes, A. fib comes in with over a week of increased weight gain of over 27 pounds along with shortness of breath and swelling all over.  Patient found to be in CHF exacerbation. Pharmacy asked to dose lovenox for DVT px  Goal of Therapy:  Monitor platelets by anticoagulation protocol: Yes   Plan:  lovenox 0.5mg /kg (dose is 80mg ) sq q12h Monitor for s/s of bleeding/platelets and labs  Valrie Hart, PharmD Clinical Pharmacist Pager:  [phone] 12/08/2017   12/08/2017,9:18 AM

## 2017-12-08 NOTE — Care Management Important Message (Signed)
Important Message  Patient Details  Name: Donna Graves MRN: [id number] Date of Birth: 1951/01/06   Medicare Important Message Given:  Yes    Malcolm Metro, RN 12/08/2017, 1:26 PM

## 2017-12-08 NOTE — Progress Notes (Signed)
Nutrition Brief Note  RD was consulted for CSW due to reported dietary noncompliance w/ HF diet. Specific examples of noncompliance were not documented.   Noted in chart patient is being worked up for bariatric surgery at [facility]. Pt had a nutritional appointment ~ 1 month ago on 11/14/17. Per that note, RD noted patient had been eating out 7x/week, skipping meals frequently and eating a high carb diet. Bariatric RD had asked to see patient in 1 week w/ food journals, but patient appears to have not yet been re-seen.   Spouse says that they have completely changed the way that they are eating. He says "I do all the cooking so I know she is not getting any salt".   The diet recall he reports is suspiciously appropriate. He says they only eat eggs/oatmeal for breakfast and then salads/fruits for lunches and dinner. Denies ever eating out and the consumption of any of the high sodium foods RD named.   He reports they read labels, prepare all meals at home, weigh self daily, and follow fluid restrictions.   No nutrition interventions warranted at this time. Did leave handout for review if they wanted to brush up on foods high/low in salt. If nutrition issues arise, please consult RD.   Christophe Louis RD, LDN, CNSC Clinical Nutrition Pager: [phone] 12/08/2017 3:03 PM

## 2017-12-08 NOTE — Care Management Note (Signed)
Case Management Note  Patient Details  Name: Donna Graves MRN: [id number] Date of Birth: 1951-08-29  Subjective/Objective:         Admitted with CHF. Pt is from home, lives with spouse. Pt is ind with ADL's. She has PCP, transportation and insurance with drug coverage. Not compliant with diet. She is active with [facility] pta. Per [facility] rep, she was receiving tele monitoring, which recorded a 7lb wt gain over night which triggered the RN to contact pt's MD who instructed pt to go directly to the ED. Pt has had 5 admissions in 6 months. Not HRI or [facility] eligible.           Action/Plan: DC home with resumption of HH services. Pt aware HH has 48 hrs to resume services. CM will cont to follow and provide DC plan updates to [facility] as needed.   Expected Discharge Date:       12/12/2017           Expected Discharge Plan:  Home w Home Health Services  In-House Referral:  NA  Discharge planning Services  CM Consult  Post Acute Care Choice:  Resumption of Svcs/PTA Provider, Home Health Choice offered to:  Patient  HH Arranged:  RN, PT [facility] Agency:  Other - See comment  Status of Service:  In process, will continue to follow  Malcolm Metro, RN 12/08/2017, 1:21 PM

## 2017-12-08 NOTE — Progress Notes (Signed)
PROGRESS NOTE    Donna Graves  SWN:[id number] DOB: 1951/04/04 DOA: 12/05/2017 PCP: Wilson Singer, MD   Brief Narrative:  67 year old female with a history of chronic systolic congestive heart failure, presents to the hospital with shortness of breath and weight gain of almost 30 pounds since her last discharge.  Found to be in decompensated CHF and admitted for intravenous diuresis.   Assessment & Plan:   Principal Problem:   Acute on chronic congestive heart failure (HCC) Active Problems:   Type II diabetes mellitus (HCC)   AF (paroxysmal atrial fibrillation) (HCC)   Essential hypertension   Morbid obesity (HCC)   CHF (congestive heart failure) (HCC)   Anasarca   Hypokalemia   Cardiomyopathy (HCC)   1. Acute on chronic systolic congestive heart failure.  Ejection fraction of 25%.  On admission, her weight was elevated approximately 30 pounds since her last discharge.  Currently on intravenous Lasix.  Overall net volume status is -5.2 L.  She is still massively volume overloaded.  Continue IV diuretics.  She is also on metolazone and spironolactone.  Cardiology following. 2. Hypertension.  Blood pressures on the lower side.  Currently on Coreg and ACE inhibitors.  Continue to follow with holding parameters. 3. Atrial tachycardia.  Currently stable.  Continue Coreg.  She is on aspirin. 4. Morbid obesity.  Being followed at [facility] weight loss clinic.  Plans to undergo bariatric surgery in the future. 5. Nonischemic cardiomyopathy.  Status post ICD placement.  Followed by Dr. Ladona Ridgel.   DVT prophylaxis: Lovenox Code Status: Full code Family Communication: Discussed with husband at the bedside Disposition Plan: Discharge home once she has been adequately diuresed   Consultants:   Cardiology  Procedures:     Antimicrobials:       Subjective: Still feels short of breath.  No chest pain.  Objective: Vitals:   12/08/17 0244 12/08/17 0519 12/08/17 0925  12/08/17 1549  BP:  (!) 93/46 (!) 90/53 99/83  Pulse:  69 79 71  Resp:    18  Temp:  98 F (36.7 C)  98.8 F (37.1 C)  TempSrc:  Oral  Oral  SpO2:  92%  92%  Weight: (!) 153.2 kg (337 lb 11.2 oz)     Height:        Intake/Output Summary (Last 24 hours) at 12/08/2017 1649 Last data filed at 12/08/2017 1550 Gross per 24 hour  Intake 240 ml  Output 4100 ml  Net -3860 ml   Filed Weights   12/05/17 2116 12/06/17 0551 12/08/17 0244  Weight: (!) 154 kg (339 lb 8.1 oz) (!) 154.1 kg (339 lb 11.7 oz) (!) 153.2 kg (337 lb 11.2 oz)    Examination:  General exam: Appears calm and comfortable  Respiratory system: Crackles at bases.  Respiratory effort normal. Cardiovascular system: S1 & S2 heard, RRR. No JVD, murmurs, rubs, gallops or clicks. 2+ pedal edema. Gastrointestinal system: Abdomen is obese, soft and nontender. No organomegaly or masses felt. Normal bowel sounds heard.  Abdominal wall edema present Central nervous system: Alert and oriented. No focal neurological deficits. Extremities: Symmetric 5 x 5 power. Skin: No rashes, lesions or ulcers Psychiatry: Judgement and insight appear normal. Mood & affect appropriate.     Data Reviewed: I have personally reviewed following labs and imaging studies  CBC: Recent Labs  Lab 12/05/17 1422 12/07/17 0620  WBC 5.3 5.5  HGB 15.4* 15.1*  HCT 48.6* 47.4*  MCV 92.7 91.9  PLT 155 155  Basic Metabolic Panel: Recent Labs  Lab 12/05/17 1422 12/06/17 0241 12/06/17 0400 12/07/17 0620 12/08/17 0536  NA 133* 135  --  132* 131*  K 3.0* 2.3*  --  2.4* 2.7*  CL 84* 85*  --  85* 87*  CO2 34* 36*  --  33* 32  GLUCOSE 124* 89  --  77 104*  BUN 25* 22*  --  22* 22*  CREATININE 1.12* 0.96  --  0.88 0.93  CALCIUM 9.0 8.7*  --  8.6* 8.7*  MG  --   --  1.8 1.8 1.8  PHOS  --   --  4.0  --   --    GFR: Estimated Creatinine Clearance: 89.6 mL/min (by C-G formula based on SCr of 0.93 mg/dL). Liver Function Tests: No results for  input(s): AST, ALT, ALKPHOS, BILITOT, PROT, ALBUMIN in the last 168 hours. No results for input(s): LIPASE, AMYLASE in the last 168 hours. No results for input(s): AMMONIA in the last 168 hours. Coagulation Profile: No results for input(s): INR, PROTIME in the last 168 hours. Cardiac Enzymes: Recent Labs  Lab 12/05/17 2032 12/06/17 0241 12/06/17 0851  TROPONINI 0.04* 0.05* 0.06*   BNP (last 3 results) No results for input(s): PROBNP in the last 8760 hours. HbA1C: No results for input(s): HGBA1C in the last 72 hours. CBG: Recent Labs  Lab 12/07/17 1107 12/07/17 1622 12/07/17 2128 12/08/17 0804 12/08/17 1129  GLUCAP 146* 208* 115* 103* 164*   Lipid Profile: No results for input(s): CHOL, HDL, LDLCALC, TRIG, CHOLHDL, LDLDIRECT in the last 72 hours. Thyroid Function Tests: No results for input(s): TSH, T4TOTAL, FREET4, T3FREE, THYROIDAB in the last 72 hours. Anemia Panel: No results for input(s): VITAMINB12, FOLATE, FERRITIN, TIBC, IRON, RETICCTPCT in the last 72 hours. Sepsis Labs: No results for input(s): PROCALCITON, LATICACIDVEN in the last 168 hours.  No results found for this or any previous visit (from the past 240 hour(s)).       Radiology Studies: No results found.      Scheduled Meds: . aspirin EC  81 mg Oral Daily  . carvedilol  6.25 mg Oral BID WC  . docusate sodium  100 mg Oral BID  . enoxaparin (LOVENOX) injection  80 mg Subcutaneous Q24H  . furosemide  80 mg Intravenous BID  . insulin aspart  0-9 Units Subcutaneous TID WC  . lisinopril  2.5 mg Oral Daily  . potassium chloride  40 mEq Oral BID  . sodium chloride flush  3 mL Intravenous Q12H  . spironolactone  25 mg Oral Daily   Continuous Infusions: . sodium chloride       LOS: 3 days    Time spent:    Erick Blinks, MD [facility] Pager [phone]  If 7PM-7AM, please contact night-coverage [email or web address] Password [facility] 12/08/2017, 4:49 PM

## 2017-12-08 NOTE — Progress Notes (Addendum)
Progress Note  Patient Name: Donna Graves Date of Encounter: 12/08/2017  Primary Cardiologist: Dina Rich, MD   Subjective   Having some nausea this morning. Respiratory status improving and no orthopnea overnight. No chest pain or palpitations.   Inpatient Medications    Scheduled Meds: . aspirin EC  81 mg Oral Daily  . carvedilol  6.25 mg Oral BID WC  . docusate sodium  100 mg Oral BID  . enoxaparin (LOVENOX) injection  80 mg Subcutaneous Q24H  . furosemide  80 mg Intravenous BID  . insulin aspart  0-9 Units Subcutaneous TID WC  . lisinopril  2.5 mg Oral Daily  . potassium chloride  40 mEq Oral BID  . sodium chloride flush  3 mL Intravenous Q12H   Continuous Infusions: . sodium chloride     PRN Meds: sodium chloride, acetaminophen, bisacodyl, ondansetron (ZOFRAN) IV, polyethylene glycol, sodium chloride flush, sodium phosphate   Vital Signs    Vitals:   12/07/17 1412 12/07/17 2100 12/08/17 0244 12/08/17 0519  BP: (!) 145/57 (!) 110/55  (!) 93/46  Pulse: 75 73  69  Resp: 19 (!) 24    Temp: 98.5 F (36.9 C) 97.7 F (36.5 C)  98 F (36.7 C)  TempSrc: Oral Oral  Oral  SpO2: 93% 95%  92%  Weight:   (!) 337 lb 11.2 oz (153.2 kg)   Height:        Intake/Output Summary (Last 24 hours) at 12/08/2017 0734 Last data filed at 12/08/2017 0521 Gross per 24 hour  Intake 530 ml  Output 2625 ml  Net -2095 ml   Filed Weights   12/05/17 2116 12/06/17 0551 12/08/17 0244  Weight: (!) 339 lb 8.1 oz (154 kg) (!) 339 lb 11.7 oz (154.1 kg) (!) 337 lb 11.2 oz (153.2 kg)    Telemetry    Not connected to telemetry.   ECG    No new tracings.   Physical Exam   General: Well developed morbidly obese Caucasian female appearing in no acute distress. Head: Normocephalic, atraumatic.  Neck: Supple without bruits, JVD difficult to assess secondary to body habitus. Lungs:  Resp regular and unlabored, decreased breath sounds along bases bilaterally. Heart: RRR, S1, S2,  no S3, S4, or murmur; no rub. Abdomen: Soft, non-tender, non-distended with normoactive bowel sounds. No hepatomegaly. No rebound/guarding. No obvious abdominal masses. Extremities: No clubbing, cyanosis, 2+ pitting edema bilaterally. Distal pedal pulses are 2+ bilaterally. Neuro: Alert and oriented X 3. Moves all extremities spontaneously. Psych: Normal affect.  Labs    Chemistry Recent Labs  Lab 12/06/17 0241 12/07/17 0620 12/08/17 0536  NA 135 132* 131*  K 2.3* 2.4* 2.7*  CL 85* 85* 87*  CO2 36* 33* 32  GLUCOSE 89 77 104*  BUN 22* 22* 22*  CREATININE 0.96 0.88 0.93  CALCIUM 8.7* 8.6* 8.7*  GFRNONAA >60 >60 >60  GFRAA >60 >60 >60  ANIONGAP 14 14 12      Hematology Recent Labs  Lab 12/05/17 1422 12/07/17 0620  WBC 5.3 5.5  RBC 5.24* 5.16*  HGB 15.4* 15.1*  HCT 48.6* 47.4*  MCV 92.7 91.9  MCH 29.4 29.3  MCHC 31.7 31.9  RDW 17.0* 17.2*  PLT 155 155    Cardiac Enzymes Recent Labs  Lab 12/05/17 2032 12/06/17 0241 12/06/17 0851  TROPONINI 0.04* 0.05* 0.06*   No results for input(s): TROPIPOC in the last 168 hours.   BNP Recent Labs  Lab 12/05/17 1422  BNP 400.0*  DDimer No results for input(s): DDIMER in the last 168 hours.   Radiology    No results found.  Cardiac Studies   Limited Echocardiogram: 10/17/2017 Study Conclusions  - Left ventricle: The estimated ejection fraction was 25%. - Ventricular septum: Septal motion showed abnormal function and   dyssynergy. - Mitral valve: Mildly calcified annulus. - Right ventricle: The cavity size was mildly dilated. Systolic   function was moderately reduced.  Impressions:  - Limited study with Definity. Images are still quite limited. LVEF   estimated at approximately 25% based on the most consistent   views. Cannot specifically comment on focal wall motion although   there does look to be septal dyssynergy. There is also moderate   right ventricular dysfunction.  Patient Profile     67  y.o. female w/ PMH of chronic combined systolic and diastolic CHF (EF 02-23% by echo with cath in 2014 showing nonobstructive CAD), nonischemic cardiomyopathy (s/p BiV ICD placement in 05/2017 - unable to have LV lead placed), PAT, HTN, HLD, Type 2 DM, and morbid obesity currently admitted for acute CHF exacerbation. Cardiology consulted to assist with diuresis.   Assessment & Plan    1. Acute on Chronic Combined systolic and diastolic CHF - the patient has a known reduced EF of 25% by most recent echo in 09/2017. Presented with worsening fluid retention along her upper and lower extremities along with abdominal distension. Notes associated orthopnea. Reports compliance with Torsemide 80mg  BID and Metolazone twice weekly prior to admission.  -  Initial BNP at 400. Cyclic troponin values have been flat at 0.04, 0.05, and 0.06. Initially on IV Lasix 40 mg twice daily with Metolazone 5mg  daily. IV Lasix was increased to 80mg  BID on 3/21 with Metolazone currently held due to significant hypokalemia. Overall net output of -5.2L thus far and creatinine has remained stable. Weight down 5 lbs but she is still massively volume overloaded and up over 20 lbs from baseline (baseline weight 315-317 lbs). Consider adding back Metolazone dosing tomorrow pending aggressive K+ replacement today. Continue with Lasix at current dosing. May need to consider Lasix drip if diuresis slows.  - PTA BB and ACE-I initially held due to BP. Have been restarted with hold parameters in place.   2. Nonischemic cardiomyopathy - s/p dual chamber [employer] ICD placement in 05/2017 - unable to have LV lead placed at that time as outlined by prior notes. -  Followed by Dr. Ladona Ridgel as an outpatient.  3. Paroxysmal Atrial Tachycardia - maintaining NSR this admission. Mg 1.8. K+ remains low at 2.7 (replacement ordered).  - PTA Coreg initially held secondary to soft BP. Has been restarted.  4. HTN - BP has been variable at 93/46 -  145/57 within the past 24 hours in the setting of diuresis. - Continue to follow.   5. Morbid Obesity - BMI greater than 58. Being followed by [facility] with plans for bariatric surgery in the near future.   6. Hypokalemia/ Hyponatremia - K+ trending up to 2.7. Na+ at 132. - scheduled for K+ supplementation throughout the day. Repeat BMET in AM.    For questions or updates, please contact [facility] Please consult [email or web address] for contact info under Cardiology/STEMI.   Signed, Ellsworth Lennox , PA-C 7:34 AM 12/08/2017 Pager: [phone]  Pt seen and examined   I agree with findings as noted by B Strader above  Pt is a 67 yo with hx of NICM.  Admitted with CHF   She  is diuresing but still with evid of volume increase on exam Neck:  Full  Lungs are CTA   Cardiac exam:  RRR  NO S3  Ext with 2+ edema in legs  I agree with continued diuresis.  I would add aldactone 25 to regimen  This may help with KCL issues as well as diuresis.   Follow I/O and renal function.  Dietrich Pates

## 2017-12-08 NOTE — Progress Notes (Signed)
CRITICAL VALUE ALERT  Critical Value:  K 2.7  Date & Time Notied:  12/08/17 0715  Provider Notified: Dr. Kerry Hough  Orders Received/Actions taken:

## 2017-12-09 LAB — GLUCOSE, CAPILLARY
GLUCOSE-CAPILLARY: 143 mg/dL — AB (ref 65–99)
GLUCOSE-CAPILLARY: 131 mg/dL — AB (ref 65–99)
Glucose-Capillary: 96 mg/dL (ref 65–99)
Glucose-Capillary: 229 mg/dL — ABNORMAL HIGH (ref 65–99)

## 2017-12-09 LAB — BASIC METABOLIC PANEL
Anion gap: 13 (ref 5–15)
BUN: 19 mg/dL (ref 6–20)
CALCIUM: 8.9 mg/dL (ref 8.9–10.3)
CO2: 34 mmol/L — AB (ref 22–32)
CREATININE: 0.78 mg/dL (ref 0.44–1.00)
Chloride: 88 mmol/L — ABNORMAL LOW (ref 101–111)
GFR calc Af Amer: >60 mL/min (ref >60)
Glucose, Bld: 85 mg/dL (ref 65–99)
Potassium: 2.5 mmol/L — CL (ref 3.5–5.1)
Sodium: 135 mmol/L (ref 135–145)

## 2017-12-09 LAB — POTASSIUM: Potassium: 2.9 mmol/L — ABNORMAL LOW (ref 3.5–5.1)

## 2017-12-09 MED ORDER — POTASSIUM CHLORIDE CRYS ER 20 MEQ PO TBCR
40.0000 meq | EXTENDED_RELEASE_TABLET | ORAL | Status: AC
  Administered 2017-12-09 (×4): 40 meq via ORAL
  Filled 2017-12-09: qty 2
  Filled 2017-12-09: qty 4
  Filled 2017-12-09: qty 2

## 2017-12-09 NOTE — Progress Notes (Signed)
PROGRESS NOTE    Donna Graves  FSF:[id number] DOB: 07-03-1951 DOA: 12/05/2017 PCP: Wilson Singer, MD   Brief Narrative:  67 year old female with a history of chronic systolic congestive heart failure, presents to the hospital with shortness of breath and weight gain of almost 30 pounds since her last discharge.  Found to be in decompensated CHF and admitted for intravenous diuresis.   Assessment & Plan:   Principal Problem:   Acute on chronic congestive heart failure (HCC) Active Problems:   Type II diabetes mellitus (HCC)   AF (paroxysmal atrial fibrillation) (HCC)   Essential hypertension   Morbid obesity (HCC)   CHF (congestive heart failure) (HCC)   Anasarca   Hypokalemia   Cardiomyopathy (HCC)   1. Acute on chronic systolic congestive heart failure.  Ejection fraction of 25%.  On admission, her weight was elevated approximately 30 pounds since her last discharge.  Currently on intravenous Lasix.,  Metolazone and spironolactone overall net volume status is -8.5 L.  She is still massively volume overloaded.  Continue IV diuretics.  Cardiology following. 2. Hypertension.  Blood pressures on the lower side.  Currently on Coreg and ACE inhibitors.  Continue to follow with holding parameters. 3. Atrial tachycardia.  Currently stable.  Continue Coreg.  She is on aspirin. 4. Morbid obesity.  Being followed at [facility] clinic.  Plans to undergo bariatric surgery in the future. 5. Nonischemic cardiomyopathy.  Status post ICD placement.  Followed by Dr. Ladona Ridgel.   DVT prophylaxis: Lovenox Code Status: Full code Family Communication: Discussed with husband at the bedside Disposition Plan: Discharge home once she has been adequately diuresed   Consultants:   Cardiology  Procedures:     Antimicrobials:       Subjective: Feels that shortness of breath is slowly improving.  Not back to baseline.  Still feels very edematous, although mildly  better.  Objective: Vitals:   12/08/17 1549 12/08/17 2124 12/09/17 0629 12/09/17 1428  BP: 99/83 122/81 100/64 (!) 112/37  Pulse: 71 71 75 74  Resp: 18   18  Temp: 98.8 F (37.1 C) 98.7 F (37.1 C) (!) 97.3 F (36.3 C) 98 F (36.7 C)  TempSrc: Oral Oral Oral Oral  SpO2: 92% 92% 91% 93%  Weight:   (!) 154.7 kg (341 lb 0.8 oz)   Height:        Intake/Output Summary (Last 24 hours) at 12/09/2017 1820 Last data filed at 12/09/2017 1258 Gross per 24 hour  Intake 480 ml  Output 2900 ml  Net -2420 ml   Filed Weights   12/06/17 0551 12/08/17 0244 12/09/17 0629  Weight: (!) 154.1 kg (339 lb 11.7 oz) (!) 153.2 kg (337 lb 11.2 oz) (!) 154.7 kg (341 lb 0.8 oz)    Examination:  General exam: Alert, awake, oriented x 3 Respiratory system: Crackles at bases.  Respiratory effort normal. Cardiovascular system:RRR. No murmurs, rubs, gallops. Gastrointestinal system: Abdomen is obese, soft and nontender. No organomegaly or masses felt. Normal bowel sounds heard.  Abdominal wall edema present Central nervous system: Alert and oriented. No focal neurological deficits. Extremities: 2+ pedal edema bilaterally Skin: No rashes, lesions or ulcers Psychiatry: Judgement and insight appear normal. Mood & affect appropriate.   Data Reviewed: I have personally reviewed following labs and imaging studies  CBC: Recent Labs  Lab 12/05/17 1422 12/07/17 0620  WBC 5.3 5.5  HGB 15.4* 15.1*  HCT 48.6* 47.4*  MCV 92.7 91.9  PLT 155 155   Basic  Metabolic Panel: Recent Labs  Lab 12/05/17 1422 12/06/17 0241 12/06/17 0400 12/07/17 0620 12/08/17 0536 12/09/17 0653  NA 133* 135  --  132* 131* 135  K 3.0* 2.3*  --  2.4* 2.7* 2.5*  CL 84* 85*  --  85* 87* 88*  CO2 34* 36*  --  33* 32 34*  GLUCOSE 124* 89  --  77 104* 85  BUN 25* 22*  --  22* 22* 19  CREATININE 1.12* 0.96  --  0.88 0.93 0.78  CALCIUM 9.0 8.7*  --  8.6* 8.7* 8.9  MG  --   --  1.8 1.8 1.8  --   PHOS  --   --  4.0  --   --   --     GFR: Estimated Creatinine Clearance: 104.8 mL/min (by C-G formula based on SCr of 0.78 mg/dL). Liver Function Tests: No results for input(s): AST, ALT, ALKPHOS, BILITOT, PROT, ALBUMIN in the last 168 hours. No results for input(s): LIPASE, AMYLASE in the last 168 hours. No results for input(s): AMMONIA in the last 168 hours. Coagulation Profile: No results for input(s): INR, PROTIME in the last 168 hours. Cardiac Enzymes: Recent Labs  Lab 12/05/17 2032 12/06/17 0241 12/06/17 0851  TROPONINI 0.04* 0.05* 0.06*   BNP (last 3 results) No results for input(s): PROBNP in the last 8760 hours. HbA1C: No results for input(s): HGBA1C in the last 72 hours. CBG: Recent Labs  Lab 12/08/17 1656 12/08/17 2126 12/09/17 0739 12/09/17 1156 12/09/17 1643  GLUCAP 114* 215* 96 229* 143*   Lipid Profile: No results for input(s): CHOL, HDL, LDLCALC, TRIG, CHOLHDL, LDLDIRECT in the last 72 hours. Thyroid Function Tests: No results for input(s): TSH, T4TOTAL, FREET4, T3FREE, THYROIDAB in the last 72 hours. Anemia Panel: No results for input(s): VITAMINB12, FOLATE, FERRITIN, TIBC, IRON, RETICCTPCT in the last 72 hours. Sepsis Labs: No results for input(s): PROCALCITON, LATICACIDVEN in the last 168 hours.  No results found for this or any previous visit (from the past 240 hour(s)).       Radiology Studies: No results found.      Scheduled Meds: . aspirin EC  81 mg Oral Daily  . carvedilol  6.25 mg Oral BID WC  . docusate sodium  100 mg Oral BID  . enoxaparin (LOVENOX) injection  80 mg Subcutaneous Q24H  . furosemide  80 mg Intravenous BID  . insulin aspart  0-9 Units Subcutaneous TID WC  . lisinopril  2.5 mg Oral Daily  . potassium chloride  40 mEq Oral BID  . sodium chloride flush  3 mL Intravenous Q12H  . spironolactone  25 mg Oral Daily   Continuous Infusions: . sodium chloride       LOS: 4 days    Time spent:    Erick Blinks, MD [facility] Pager [phone]  If 7PM-7AM, please contact night-coverage [email or web address] Password [facility] 12/09/2017, 6:20 PM

## 2017-12-10 LAB — GLUCOSE, CAPILLARY
GLUCOSE-CAPILLARY: 101 mg/dL — AB (ref 65–99)
GLUCOSE-CAPILLARY: 155 mg/dL — AB (ref 65–99)
GLUCOSE-CAPILLARY: 121 mg/dL — AB (ref 65–99)
Glucose-Capillary: 179 mg/dL — ABNORMAL HIGH (ref 65–99)

## 2017-12-10 LAB — BASIC METABOLIC PANEL
Anion gap: 12 (ref 5–15)
BUN: 19 mg/dL (ref 6–20)
CALCIUM: 8.9 mg/dL (ref 8.9–10.3)
CHLORIDE: 87 mmol/L — AB (ref 101–111)
CO2: 32 mmol/L (ref 22–32)
Creatinine, Ser: 0.79 mg/dL (ref 0.44–1.00)
GFR calc Af Amer: >60 mL/min (ref >60)
GFR calc non Af Amer: >60 mL/min (ref >60)
Glucose, Bld: 90 mg/dL (ref 65–99)
Potassium: 3.1 mmol/L — ABNORMAL LOW (ref 3.5–5.1)
SODIUM: 131 mmol/L — AB (ref 135–145)

## 2017-12-10 NOTE — Progress Notes (Signed)
PROGRESS NOTE    Donna Graves  HEN:[id number] DOB: 1951/05/31 DOA: 12/05/2017 PCP: Wilson Singer, MD   Brief Narrative:  67 year old female with a history of chronic systolic congestive heart failure, presents to the hospital with shortness of breath and weight gain of almost 30 pounds since her last discharge.  Found to be in decompensated CHF and admitted for intravenous diuresis.   Assessment & Plan:   Principal Problem:   Acute on chronic congestive heart failure (HCC) Active Problems:   Type II diabetes mellitus (HCC)   AF (paroxysmal atrial fibrillation) (HCC)   Essential hypertension   Morbid obesity (HCC)   CHF (congestive heart failure) (HCC)   Anasarca   Hypokalemia   Cardiomyopathy (HCC)   1. Acute on chronic systolic congestive heart failure.  Ejection fraction of 25%.  On admission, her weight was elevated approximately 30 pounds since her last discharge.  Currently on intravenous Lasix, metolazone and spironolactone. Overall net volume status is -10.2 L.  She continues to have significant volume overload needing IV diuresis.  Cardiology following. 2. Hypertension.  Blood pressures on the lower side.  Currently on Coreg and ACE inhibitors.  Continue to follow with holding parameters. 3. Atrial tachycardia.  Currently stable.  Continue Coreg.   4. Morbid obesity.  Being followed at [facility] clinic.  Plans to undergo bariatric surgery in the future. 5. Nonischemic cardiomyopathy.  Status post ICD placement.  Followed by Dr. Ladona Ridgel.   DVT prophylaxis: Lovenox Code Status: Full code Family Communication: No family present Disposition Plan: Discharge home once she has been adequately diuresed   Consultants:   Cardiology  Procedures:     Antimicrobials:       Subjective: Does not feel significantly different as compared to yesterday.  Overall shortness of breath is improving, but still feels very edematous.  Objective: Vitals:     12/09/17 1428 12/09/17 2111 12/10/17 0600 12/10/17 1500  BP: (!) 112/37 (!) 100/59 118/83 (!) 96/50  Pulse: 74 72 75 86  Resp: 18 18 18 18   Temp: 98 F (36.7 C) 99.1 F (37.3 C) 97.7 F (36.5 C) 97.7 F (36.5 C)  TempSrc: Oral Oral Oral Oral  SpO2: 93% 93% 95% 95%  Weight:   (!) 151.8 kg (334 lb 10.5 oz)   Height:        Intake/Output Summary (Last 24 hours) at 12/10/2017 1759 Last data filed at 12/10/2017 1700 Gross per 24 hour  Intake 720 ml  Output 2400 ml  Net -1680 ml   Filed Weights   12/08/17 0244 12/09/17 0629 12/10/17 0600  Weight: (!) 153.2 kg (337 lb 11.2 oz) (!) 154.7 kg (341 lb 0.8 oz) (!) 151.8 kg (334 lb 10.5 oz)    Examination:  General exam: Alert, awake, oriented x 3 Respiratory system: Crackles at bases improving. Respiratory effort normal. Cardiovascular system:RRR. No murmurs, rubs, gallops. Gastrointestinal system: Abdomen is obese, soft and nontender. No organomegaly or masses felt. Normal bowel sounds heard.  Abdominal wall pitting edema present Central nervous system: Alert and oriented. No focal neurological deficits. Extremities: 2+ edema in lower extremities Skin: No rashes, lesions or ulcers Psychiatry: Judgement and insight appear normal. Mood & affect appropriate.   Data Reviewed: I have personally reviewed following labs and imaging studies  CBC: Recent Labs  Lab 12/05/17 1422 12/07/17 0620  WBC 5.3 5.5  HGB 15.4* 15.1*  HCT 48.6* 47.4*  MCV 92.7 91.9  PLT 155 155   Basic Metabolic Panel: Recent  Labs  Lab 12/06/17 0241 12/06/17 0400 12/07/17 0620 12/08/17 0536 12/09/17 0653 12/09/17 1903 12/10/17 0649  NA 135  --  132* 131* 135  --  131*  K 2.3*  --  2.4* 2.7* 2.5* 2.9* 3.1*  CL 85*  --  85* 87* 88*  --  87*  CO2 36*  --  33* 32 34*  --  32  GLUCOSE 89  --  77 104* 85  --  90  BUN 22*  --  22* 22* 19  --  19  CREATININE 0.96  --  0.88 0.93 0.78  --  0.79  CALCIUM 8.7*  --  8.6* 8.7* 8.9  --  8.9  MG  --  1.8 1.8 1.8   --   --   --   PHOS  --  4.0  --   --   --   --   --    GFR: Estimated Creatinine Clearance: 103.5 mL/min (by C-G formula based on SCr of 0.79 mg/dL). Liver Function Tests: No results for input(s): AST, ALT, ALKPHOS, BILITOT, PROT, ALBUMIN in the last 168 hours. No results for input(s): LIPASE, AMYLASE in the last 168 hours. No results for input(s): AMMONIA in the last 168 hours. Coagulation Profile: No results for input(s): INR, PROTIME in the last 168 hours. Cardiac Enzymes: Recent Labs  Lab 12/05/17 2032 12/06/17 0241 12/06/17 0851  TROPONINI 0.04* 0.05* 0.06*   BNP (last 3 results) No results for input(s): PROBNP in the last 8760 hours. HbA1C: No results for input(s): HGBA1C in the last 72 hours. CBG: Recent Labs  Lab 12/09/17 1643 12/09/17 2111 12/10/17 0748 12/10/17 1136 12/10/17 1626  GLUCAP 143* 131* 101* 179* 155*   Lipid Profile: No results for input(s): CHOL, HDL, LDLCALC, TRIG, CHOLHDL, LDLDIRECT in the last 72 hours. Thyroid Function Tests: No results for input(s): TSH, T4TOTAL, FREET4, T3FREE, THYROIDAB in the last 72 hours. Anemia Panel: No results for input(s): VITAMINB12, FOLATE, FERRITIN, TIBC, IRON, RETICCTPCT in the last 72 hours. Sepsis Labs: No results for input(s): PROCALCITON, LATICACIDVEN in the last 168 hours.  No results found for this or any previous visit (from the past 240 hour(s)).       Radiology Studies: No results found.      Scheduled Meds: . aspirin EC  81 mg Oral Daily  . carvedilol  6.25 mg Oral BID WC  . docusate sodium  100 mg Oral BID  . enoxaparin (LOVENOX) injection  80 mg Subcutaneous Q24H  . furosemide  80 mg Intravenous BID  . insulin aspart  0-9 Units Subcutaneous TID WC  . lisinopril  2.5 mg Oral Daily  . potassium chloride  40 mEq Oral BID  . sodium chloride flush  3 mL Intravenous Q12H  . spironolactone  25 mg Oral Daily   Continuous Infusions: . sodium chloride       LOS: 5 days    Time  spent:    Erick Blinks, MD [facility] Pager [phone]  If 7PM-7AM, please contact night-coverage [email or web address] Password [facility] 12/10/2017, 5:59 PM

## 2017-12-11 DIAGNOSIS — I5023 Acute on chronic systolic (congestive) heart failure: Secondary | ICD-10-CM

## 2017-12-11 DIAGNOSIS — E871 Hypo-osmolality and hyponatremia: Secondary | ICD-10-CM | POA: Diagnosis present

## 2017-12-11 DIAGNOSIS — I248 Other forms of acute ischemic heart disease: Secondary | ICD-10-CM | POA: Diagnosis present

## 2017-12-11 DIAGNOSIS — R748 Abnormal levels of other serum enzymes: Secondary | ICD-10-CM

## 2017-12-11 LAB — CBC
HCT: 46.8 % — ABNORMAL HIGH (ref 36.0–46.0)
HEMOGLOBIN: 15.2 g/dL — AB (ref 12.0–15.0)
MCH: 29.8 pg (ref 26.0–34.0)
MCHC: 32.5 g/dL (ref 30.0–36.0)
MCV: 91.8 fL (ref 78.0–100.0)
Platelets: 155 10*3/uL (ref 150–400)
RBC: 5.1 MIL/uL (ref 3.87–5.11)
RDW: 16.9 % — ABNORMAL HIGH (ref 11.5–15.5)
WBC: 5.4 10*3/uL (ref 4.0–10.5)

## 2017-12-11 LAB — BASIC METABOLIC PANEL
Anion gap: 12 (ref 5–15)
BUN: 19 mg/dL (ref 6–20)
CALCIUM: 8.4 mg/dL — AB (ref 8.9–10.3)
CHLORIDE: 87 mmol/L — AB (ref 101–111)
CO2: 31 mmol/L (ref 22–32)
CREATININE: 0.76 mg/dL (ref 0.44–1.00)
Glucose, Bld: 113 mg/dL — ABNORMAL HIGH (ref 65–99)
Potassium: 3.1 mmol/L — ABNORMAL LOW (ref 3.5–5.1)
SODIUM: 130 mmol/L — AB (ref 135–145)

## 2017-12-11 LAB — GLUCOSE, CAPILLARY
GLUCOSE-CAPILLARY: 127 mg/dL — AB (ref 65–99)
GLUCOSE-CAPILLARY: 216 mg/dL — AB (ref 65–99)
Glucose-Capillary: 127 mg/dL — ABNORMAL HIGH (ref 65–99)
Glucose-Capillary: 126 mg/dL — ABNORMAL HIGH (ref 65–99)

## 2017-12-11 MED ORDER — ALUM & MAG HYDROXIDE-SIMETH 200-200-20 MG/5ML PO SUSP
15.0000 mL | ORAL | Status: DC | PRN
  Administered 2017-12-11 – 2017-12-17 (×4): 15 mL via ORAL
  Filled 2017-12-11 (×4): qty 30

## 2017-12-11 MED ORDER — POTASSIUM CHLORIDE CRYS ER 20 MEQ PO TBCR
40.0000 meq | EXTENDED_RELEASE_TABLET | Freq: Three times a day (TID) | ORAL | Status: DC
  Administered 2017-12-11 (×3): 40 meq via ORAL
  Filled 2017-12-11 (×4): qty 2

## 2017-12-11 MED ORDER — FUROSEMIDE 10 MG/ML IJ SOLN
80.0000 mg | Freq: Three times a day (TID) | INTRAMUSCULAR | Status: DC
  Administered 2017-12-11 – 2017-12-24 (×39): 80 mg via INTRAVENOUS
  Filled 2017-12-11 (×41): qty 8

## 2017-12-11 MED ORDER — PANTOPRAZOLE SODIUM 40 MG PO TBEC
40.0000 mg | DELAYED_RELEASE_TABLET | Freq: Every day | ORAL | Status: DC
  Administered 2017-12-11 – 2017-12-25 (×14): 40 mg via ORAL
  Filled 2017-12-11 (×15): qty 1

## 2017-12-11 NOTE — Progress Notes (Signed)
Progress Note  Patient Name: Donna Graves Date of Encounter: 12/11/2017  Primary Cardiologist: Dina Rich, MD   Subjective   Ongoing SOB  Inpatient Medications    Scheduled Meds: . aspirin EC  81 mg Oral Daily  . carvedilol  6.25 mg Oral BID WC  . docusate sodium  100 mg Oral BID  . enoxaparin (LOVENOX) injection  80 mg Subcutaneous Q24H  . furosemide  80 mg Intravenous BID  . insulin aspart  0-9 Units Subcutaneous TID WC  . lisinopril  2.5 mg Oral Daily  . potassium chloride  40 mEq Oral TID  . sodium chloride flush  3 mL Intravenous Q12H  . spironolactone  25 mg Oral Daily   Continuous Infusions: . sodium chloride     PRN Meds: sodium chloride, acetaminophen, bisacodyl, ondansetron (ZOFRAN) IV, polyethylene glycol, sodium chloride flush, sodium phosphate   Vital Signs    Vitals:   12/10/17 2238 12/11/17 0029 12/11/17 0539 12/11/17 0619  BP: 110/78  (!) 81/49 129/71  Pulse: 84 88 70 69  Resp: 20 16 18    Temp: 98.1 F (36.7 C)  97.9 F (36.6 C)   TempSrc: Oral  Oral   SpO2: 93% (!) 88% 94%   Weight:   (!) 336 lb 11.2 oz (152.7 kg)   Height:        Intake/Output Summary (Last 24 hours) at 12/11/2017 0907 Last data filed at 12/10/2017 2100 Gross per 24 hour  Intake 480 ml  Output 1350 ml  Net -870 ml   Filed Weights   12/09/17 0629 12/10/17 0600 12/11/17 0539  Weight: (!) 341 lb 0.8 oz (154.7 kg) (!) 334 lb 10.5 oz (151.8 kg) (!) 336 lb 11.2 oz (152.7 kg)    Telemetry    n/a  ECG    n/a  Physical Exam   GEN: No acute distress.   Neck: elevated Cardiac: RRR, no murmurs, rubs, or gallops.  Respiratory: bilateral crackles GI: Soft, nontender, non-distended  MS: 2+ bilateral LE edema; No deformity. Neuro:  Nonfocal  Psych: Normal affect   Labs    Chemistry Recent Labs  Lab 12/09/17 0653 12/09/17 1903 12/10/17 0649 12/11/17 0646  NA 135  --  131* 130*  K 2.5* 2.9* 3.1* 3.1*  CL 88*  --  87* 87*  CO2 34*  --  32 31  GLUCOSE  85  --  90 113*  BUN 19  --  19 19  CREATININE 0.78  --  0.79 0.76  CALCIUM 8.9  --  8.9 8.4*  GFRNONAA >60  --  >60 >60  GFRAA >60  --  >60 >60  ANIONGAP 13  --  12 12     Hematology Recent Labs  Lab 12/05/17 1422 12/07/17 0620  WBC 5.3 5.5  RBC 5.24* 5.16*  HGB 15.4* 15.1*  HCT 48.6* 47.4*  MCV 92.7 91.9  MCH 29.4 29.3  MCHC 31.7 31.9  RDW 17.0* 17.2*  PLT 155 155    Cardiac Enzymes Recent Labs  Lab 12/05/17 2032 12/06/17 0241 12/06/17 0851  TROPONINI 0.04* 0.05* 0.06*   No results for input(s): TROPIPOC in the last 168 hours.   BNP Recent Labs  Lab 12/05/17 1422  BNP 400.0*     DDimer No results for input(s): DDIMER in the last 168 hours.   Radiology    No results found.  Cardiac Studies    Patient Profile     67 y.o. female admitted with acute on chronic systolic HF.  Assessment & Plan    1. Acute on chronic systolic HF - From clinic notes originally followed in [place]. Apparently had normal cath in early 2000s, LVEF around that time was 30-35%. - 07/2013 [facility]: CI 2, mean PA 27, PCWP 27, nonobstructive CAD - - BiV upgrade 05/2017, from notes unable to place LV lead. - Jan 2019 echo LVEF 25%  -negative yesterday, negative 10.9 L since admission. She is on  Lasix IV 80mg  bid, metolazone 5mg  x 2 doses this admission. REnal function is stable. Weight had been down to 312-316 at last clnic visit, today 336 - increase lasix to 80mg  tid.  - other medical therapy with coreg 6.25mg  bid, lisinopril 2.5, aldactone 25. In general soft bp's have limited medical therapy.   2. Probable OSA - needs outpatient f/u, she reports a study has been ordered by her pcp  3. Hypokalemia - replacement per primary team.  For questions or updates, please contact [facility] Please consult [email or web address] for contact info under Cardiology/STEMI.      [name], MD  12/11/2017, 9:07 AM

## 2017-12-11 NOTE — Progress Notes (Signed)
Progress Note    Donna Graves  OBS:[id number] DOB: [birth year]  DOA: 12/05/2017 PCP: Wilson Singer, MD    Brief Narrative:   Chief complaint: Follow-up shortness of breath  Medical records reviewed and are as summarized below:  Donna Graves is an 67 y.o. female with a PMH of chronic systolic CHF, EF 47%, status post AICD,morbid obesity, diabetes and atrial fibrillation who was admitted 12/05/17 for evaluation of a 27 pound weight gain associated with shortness of breath and swelling.  Assessment/Plan:   Principal Problem:   Acute on chronic systolic congestive heart failure (HCC)/cardiomyopathyanasarca/elevated troponin/demand ischemia Noted to have an EF of 25% by echo done 10/17/17. Status post AICD. Has diuresed well on Lasix, metolazone and spironolactone. Currently only on Lasix and spironolactone.She is down approximately 11 L since admission. Cardiology on board, and has increased her Lasix to 80 mg 3 times daily.  Soft BP has limited more aggressive diuresis however renal function stable.  Continue to monitor renal function and electrolytes closely while on high-dose diuretic therapy.  Active Problems:   Hyponatremia Likely from CHF physiology.    Type II diabetes mellitus (HCC) Hemoglobin A1c 7.8% on 08/18/17.currently being managed with insulin sensitive scale 3 times a day.CBGs 101-179.    AF (paroxysmal atrial fibrillation) (HCC) Rate controlled on Coreg. Continue aspirin.    Essential hypertension Blood pressure soft at times.remains on lisinopril, Coreg and diuretics.    Hypokalemia Increase supplementation dose.      Morbid obesity (HCC)/probable OSA Body mass index is 57.79 kg/m.  Needs an outpatient sleep study.   Family Communication/Anticipated D/C date and plan/Code Status   DVT prophylaxis: Lovenox ordered. Code Status: Full Code.  Family Communication: No family at the bedside. Disposition Plan: Home when approaching dry weight  (312-316 pounds)   Medical Consultants:    Cardiology   Anti-Infectives:    None  Subjective:   Patient continues to be somewhat short of breath and has nausea.  Continues to report swelling of the abdomen.  No chest pain.  Objective:    Vitals:   12/10/17 2238 12/11/17 0029 12/11/17 0539 12/11/17 0619  BP: 110/78  (!) 81/49 129/71  Pulse: 84 88 70 69  Resp: 20 16 18    Temp: 98.1 F (36.7 C)  97.9 F (36.6 C)   TempSrc: Oral  Oral   SpO2: 93% (!) 88% 94%   Weight:   (!) 152.7 kg (336 lb 11.2 oz)   Height:        Intake/Output Summary (Last 24 hours) at 12/11/2017 0724 Last data filed at 12/10/2017 2100 Gross per 24 hour  Intake 720 ml  Output 1350 ml  Net -630 ml   Filed Weights   12/09/17 0629 12/10/17 0600 12/11/17 0539  Weight: (!) 154.7 kg (341 lb 0.8 oz) (!) 151.8 kg (334 lb 10.5 oz) (!) 152.7 kg (336 lb 11.2 oz)    Exam: General: Morbidly obese female sitting on side of bed. Cardiovascular: Heart sounds show a regular rate, and rhythm. No gallops or rubs. No murmurs. No JVD. Lungs: Diminished breath sounds. No rales, rhonchi or wheezes. Abdomen: Soft, nontender, obese and distended, +bowel sounds. No masses. No hepatosplenomegaly. Neurological: Alert and oriented 3. Moves all extremities 4 with equal strength. Cranial nerves II through XII grossly intact. Skin: Warm and dry. No rashes or lesions. Extremities: No clubbing or cyanosis.  Tight pitting edema 2+. Pedal pulses 2+. Psychiatric: Mood and affect are normal. Insight and judgment are  fair.   Data Reviewed:   I have personally reviewed following labs and imaging studies:  Labs: Labs show the following:   Basic Metabolic Panel: Recent Labs  Lab 12/06/17 0400 12/07/17 0620 12/08/17 0536 12/09/17 0653  12/10/17 0649 12/11/17 0646  NA  --  132* 131* 135  --  131* 130*  K  --  2.4* 2.7* 2.5*   < > 3.1* 3.1*  CL  --  85* 87* 88*  --  87* 87*  CO2  --  33* 32 34*  --  32 31  GLUCOSE   --  77 104* 85  --  90 113*  BUN  --  22* 22* 19  --  19 19  CREATININE  --  0.88 0.93 0.78  --  0.79 0.76  CALCIUM  --  8.6* 8.7* 8.9  --  8.9 8.4*  MG 1.8 1.8 1.8  --   --   --   --   PHOS 4.0  --   --   --   --   --   --    < > = values in this interval not displayed.   GFR Estimated Creatinine Clearance: 103.9 mL/min (by C-G formula based on SCr of 0.76 mg/dL).  CBC: Recent Labs  Lab 12/05/17 1422 12/07/17 0620  WBC 5.3 5.5  HGB 15.4* 15.1*  HCT 48.6* 47.4*  MCV 92.7 91.9  PLT 155 155   Cardiac Enzymes: Recent Labs  Lab 12/05/17 2032 12/06/17 0241 12/06/17 0851  TROPONINI 0.04* 0.05* 0.06*   CBG: Recent Labs  Lab 12/09/17 2111 12/10/17 0748 12/10/17 1136 12/10/17 1626 12/10/17 2209  GLUCAP 131* 101* 179* 155* 121*    Microbiology No results found for this or any previous visit (from the past 240 hour(s)).  Procedures and diagnostic studies:  No results found.  Medications:   . aspirin EC  81 mg Oral Daily  . carvedilol  6.25 mg Oral BID WC  . docusate sodium  100 mg Oral BID  . enoxaparin (LOVENOX) injection  80 mg Subcutaneous Q24H  . furosemide  80 mg Intravenous BID  . insulin aspart  0-9 Units Subcutaneous TID WC  . lisinopril  2.5 mg Oral Daily  . potassium chloride  40 mEq Oral BID  . sodium chloride flush  3 mL Intravenous Q12H  . spironolactone  25 mg Oral Daily   Continuous Infusions: . sodium chloride       LOS: 6 days   Hillery Aldo  [facility] Pager [phone]. If unable to reach me by pager, please call my cell phone at [phone].  *Please refer to [email or web address], password [email or web address] to get updated schedule on who will round on this patient, as hospitalists switch teams weekly. If 7PM-7AM, please contact night-coverage at [email or web address], password [email or web address] for any overnight needs.  12/11/2017, 7:24 AM

## 2017-12-11 NOTE — Progress Notes (Signed)
Patient has sleep apnea and saturation decreases while asleep, placed on 3 lpm for sleep. She states she needs a Sleep study.

## 2017-12-11 NOTE — Progress Notes (Signed)
ANTICOAGULATION CONSULT NOTE   Pharmacy Consult for lovenox Indication: VTE prophylaxis  Allergies  Allergen Reactions  . Codeine Rash   Patient Measurements: Height: 5\' 4"  (162.6 cm) Weight: (!) 336 lb 11.2 oz (152.7 kg) IBW/kg (Calculated) : 54.7  Vital Signs: Temp: 97.9 F (36.6 C) (03/25 0539) Temp Source: Oral (03/25 0539) BP: 129/71 (03/25 0619) Pulse Rate: 69 (03/25 0619)  Labs: Recent Labs    12/09/17 0653 12/10/17 0649 12/11/17 0646  CREATININE 0.78 0.79 0.76   Estimated Creatinine Clearance: 103.9 mL/min (by C-G formula based on SCr of 0.76 mg/dL).  Medical History: Past Medical History:  Diagnosis Date  . Atrial fibrillation and flutter (HCC)    History of RFA and ultimately AV node ablation  . Cardiomyopathy (HCC)    a. LVEF 25-30% by echo in 04/2017.  Marland Kitchen CHF (congestive heart failure) (HCC)   . Chronic systolic heart failure (HCC)   . Depression   . GERD (gastroesophageal reflux disease)   . Gout   . History of cardiac catheterization 07/23/2013   RA 17; RV 43/20 PCWP 27.  LAD normal  LCx  norma  RCA normal.  LVEF 55%  . Hyperlipidemia   . Hypertension   . Hypothyroidism   . 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 Ladona Ridgel  . Type 2 diabetes mellitus (HCC)    Medications:  Medications Prior to Admission  Medication Sig Dispense Refill Last Dose  . acetaminophen (TYLENOL) 650 MG CR tablet Take 650-1,300 mg by mouth every 8 (eight) hours as needed for pain.   12/04/2017 at Unknown time  . acidophilus (RISAQUAD) CAPS capsule Take 1 capsule by mouth daily. ULTIMATE FLORA PROBIOTIC   12/05/2017 at Unknown time  . ARMOUR THYROID 15 MG tablet Take 1 tablet by mouth daily.  2 12/05/2017 at Unknown time  . aspirin EC 81 MG EC tablet Take 1 tablet (81 mg total) by mouth daily.   12/05/2017 at Unknown time  . carvedilol (COREG) 6.25 MG tablet Take 1 tablet (6.25 mg total) by mouth 2 (two)  times daily with a meal. 180 tablet 1 12/05/2017 at 0800  . Cholecalciferol (VITAMIN D PO) Take 7,000 Units by mouth daily.   12/05/2017 at Unknown time  . dicyclomine (BENTYL) 10 MG capsule Take 10 mg by mouth 3 (three) times daily as needed for spasms.   12/05/2017 at Unknown time  . glimepiride (AMARYL) 2 MG tablet Take 4 mg by mouth daily. BEFORE A MEAL   12/05/2017 at Unknown time  . lisinopril (PRINIVIL,ZESTRIL) 2.5 MG tablet Take 1 tablet (2.5 mg total) by mouth daily. 90 tablet 1 12/05/2017 at Unknown time  . magnesium oxide (MAG-OX) 400 MG tablet Take 400 mg by mouth daily.   12/05/2017 at Unknown time  . metolazone (ZAROXOLYN) 5 MG tablet Take 5 mg by mouth. 12/04/17 Per patient, PCP ordered Metolazone 1 tablet twice a week.   12/05/2017 at Unknown time  . Misc Natural Products (BLACK CHERRY CONCENTRATE PO) Take 2 tablets by mouth daily.    12/05/2017 at Unknown time  . Multiple Vitamins-Minerals (ALIVE ONCE DAILY WOMENS 50+ PO) Take 1 tablet by mouth daily.   12/05/2017 at Unknown time  . omeprazole (PRILOSEC) 20 MG capsule Take 20 mg by mouth every morning.   unknown  . polycarbophil (FIBERCON) 625 MG tablet Take 625 mg by mouth 4 (four) times daily.   12/05/2017 at Unknown time  . potassium chloride SA (K-DUR,KLOR-CON) 20  MEQ tablet Take 1 tablet (20 mEq total) by mouth daily. (Patient taking differently: Take 20 mEq by mouth daily. 12/04/17 Patient reported Dr Karilyn Cota increased to 5 tablets three times a day.) 30 tablet 2 12/05/2017 at Unknown time  . Red Yeast Rice Extract (RED YEAST RICE PO) Take 2 tablets by mouth daily.    12/05/2017 at Unknown time  . simvastatin (ZOCOR) 40 MG tablet Take 40 mg by mouth every evening.    12/04/2017 at Unknown time  . torsemide (DEMADEX) 20 MG tablet Take 4 tablets (80 mg total) by mouth 2 (two) times daily. 240 tablet 0 12/05/2017 at Unknown time   Assessment: 67 y.o.femalewith medical history significant ofCongestive heart failure with a EF of 25%, morbid  obesity, AICD, diabetes, A. fib comes in with over a week of increased weight gain of over 27 pounds along with shortness of breath and swelling all over.  Patient found to be in CHF exacerbation. Pharmacy asked to dose lovenox for DVT px  Goal of Therapy:  Monitor platelets by anticoagulation protocol: Yes   Plan:  lovenox 0.5mg /kg (dose is 80mg ) sq q12h Monitor for s/s of bleeding/platelets and labs  Valrie Hart, PharmD Clinical Pharmacist Pager:  [phone] 12/11/2017   12/11/2017,9:09 AM

## 2017-12-12 LAB — CUP PACEART REMOTE DEVICE CHECK
Battery Voltage: 3.01 V
Brady Statistic AP VP Percent: 72.37 %
Brady Statistic AS VP Percent: 25.96 %
Brady Statistic RA Percent Paced: 66.4 %
Brady Statistic RV Percent Paced: 96.04 %
HighPow Impedance: 81 Ohm
Implantable Lead Implant Date: 20050121
Implantable Lead Implant Date: 20180904
Implantable Lead Location: 753859
Implantable Lead Location: 753860
Implantable Lead Location: 753860
Implantable Lead Model: 5076
Implantable Lead Model: 5076
Implantable Lead Model: 6935
Implantable Pulse Generator Implant Date: 20180904
Lead Channel Impedance Value: 285 Ohm
Lead Channel Impedance Value: 418 Ohm
Lead Channel Pacing Threshold Amplitude: 1.625 V
Lead Channel Pacing Threshold Pulse Width: 0.4 ms
Lead Channel Setting Pacing Amplitude: 2.25 V
Lead Channel Setting Pacing Amplitude: 3.25 V
Lead Channel Setting Pacing Pulse Width: 0.4 ms
Lead Channel Setting Sensing Sensitivity: 0.3 mV
MDC IDC LEAD IMPLANT DT: 20141223
MDC IDC MSMT BATTERY REMAINING LONGEVITY: 80 mo
MDC IDC MSMT LEADCHNL LV IMPEDANCE VALUE: 4047 Ohm
MDC IDC MSMT LEADCHNL LV IMPEDANCE VALUE: 4047 Ohm
MDC IDC MSMT LEADCHNL LV IMPEDANCE VALUE: 4047 Ohm
MDC IDC MSMT LEADCHNL RA IMPEDANCE VALUE: 589 Ohm
MDC IDC MSMT LEADCHNL RA PACING THRESHOLD AMPLITUDE: 1.5 V
MDC IDC MSMT LEADCHNL RA PACING THRESHOLD PULSEWIDTH: 0.4 ms
MDC IDC MSMT LEADCHNL RA SENSING INTR AMPL: 0.625 mV
MDC IDC MSMT LEADCHNL RA SENSING INTR AMPL: 0.625 mV
MDC IDC SESS DTM: 20190318083625
MDC IDC STAT BRADY AP VS PERCENT: 0.29 %
MDC IDC STAT BRADY AS VS PERCENT: 1.37 %

## 2017-12-12 LAB — BASIC METABOLIC PANEL
Anion gap: 16 — ABNORMAL HIGH (ref 5–15)
BUN: 17 mg/dL (ref 6–20)
CALCIUM: 8.8 mg/dL — AB (ref 8.9–10.3)
CO2: 30 mmol/L (ref 22–32)
Chloride: 86 mmol/L — ABNORMAL LOW (ref 101–111)
Creatinine, Ser: 0.73 mg/dL (ref 0.44–1.00)
Glucose, Bld: 103 mg/dL — ABNORMAL HIGH (ref 65–99)
POTASSIUM: 3.2 mmol/L — AB (ref 3.5–5.1)
Sodium: 132 mmol/L — ABNORMAL LOW (ref 135–145)

## 2017-12-12 LAB — GLUCOSE, CAPILLARY
GLUCOSE-CAPILLARY: 243 mg/dL — AB (ref 65–99)
Glucose-Capillary: 122 mg/dL — ABNORMAL HIGH (ref 65–99)
Glucose-Capillary: 149 mg/dL — ABNORMAL HIGH (ref 65–99)

## 2017-12-12 MED ORDER — METOLAZONE 5 MG PO TABS
2.5000 mg | ORAL_TABLET | Freq: Once | ORAL | Status: AC
  Administered 2017-12-12: 2.5 mg via ORAL
  Filled 2017-12-12: qty 1

## 2017-12-12 MED ORDER — POTASSIUM CHLORIDE CRYS ER 10 MEQ PO TBCR
40.0000 meq | EXTENDED_RELEASE_TABLET | Freq: Four times a day (QID) | ORAL | Status: DC
  Administered 2017-12-12 – 2017-12-24 (×51): 40 meq via ORAL
  Filled 2017-12-12 (×3): qty 2
  Filled 2017-12-12: qty 4
  Filled 2017-12-12: qty 2
  Filled 2017-12-12 (×2): qty 4
  Filled 2017-12-12 (×2): qty 2
  Filled 2017-12-12: qty 4
  Filled 2017-12-12 (×4): qty 2
  Filled 2017-12-12 (×2): qty 4
  Filled 2017-12-12 (×6): qty 2
  Filled 2017-12-12: qty 4
  Filled 2017-12-12: qty 2
  Filled 2017-12-12: qty 4
  Filled 2017-12-12: qty 2
  Filled 2017-12-12 (×3): qty 4
  Filled 2017-12-12: qty 2
  Filled 2017-12-12 (×4): qty 4
  Filled 2017-12-12 (×2): qty 2
  Filled 2017-12-12 (×4): qty 4
  Filled 2017-12-12 (×2): qty 2
  Filled 2017-12-12: qty 4
  Filled 2017-12-12: qty 2
  Filled 2017-12-12: qty 4
  Filled 2017-12-12: qty 2
  Filled 2017-12-12 (×6): qty 4

## 2017-12-12 NOTE — Progress Notes (Signed)
Progress Note    Donna Graves  ZOX:[id number] DOB: 11/29/1950  DOA: 12/05/2017 PCP: Wilson Singer, MD    Brief Narrative:   Chief complaint: Follow-up shortness of breath  Medical records reviewed and are as summarized below:  Donna Graves is an 67 y.o. female with a PMH of chronic systolic CHF, EF 81%, status post AICD,morbid obesity, diabetes and atrial fibrillation who was admitted 12/05/17 for evaluation of a 27 pound weight gain associated with shortness of breath and swelling.  Assessment/Plan:   Principal Problem:   Acute on chronic systolic congestive heart failure (HCC)/cardiomyopathyanasarca/elevated troponin/demand ischemia Noted to have an EF of 25% by echo done 10/17/17. Status post AICD. Has diuresed well on Lasix, metolazone and spironolactone. Currently only on Lasix and spironolactone.She is down approximately 13 L since admission.  Lasix increased to 80 mg 3 times daily by cardiology on 12/11/17 in the metolazone ordered for today.  Soft BP has limited more aggressive diuresis however renal function stable.  Continue to monitor renal function and electrolytes closely while on high-dose diuretic therapy. Remains volume overloaded.  Active Problems:   Hyponatremia Likely from CHF physiology. Slowly improving.    Type II diabetes mellitus (HCC) Hemoglobin A1c 7.8% on 08/18/17.currently being managed with insulin sensitive scale 3 times a day.CBGs 121-216.    AF (paroxysmal atrial fibrillation) (HCC) Rate controlled on Coreg. Continue aspirin.    Essential hypertension Blood pressure soft at times.remains on lisinopril, Coreg and diuretics.    Hypokalemia Increase supplementation dose to 40 mEq QID.      Morbid obesity (HCC)/probable OSA Body mass index is 57.64 kg/m.  Needs an outpatient sleep study.   Family Communication/Anticipated D/C date and plan/Code Status   DVT prophylaxis: Lovenox ordered. Code Status: Full Code.  Family  Communication: Husband updated at the bedside. Disposition Plan: Home when approaching dry weight (312-316 pounds)   Medical Consultants:    Cardiology   Anti-Infectives:    None  Subjective:   Patient continues to report significant swelling around the abdomen and tight lower extremities. Dyspnea is improving. Appetite is fair. Reports some nausea but no vomiting.  Objective:    Vitals:   12/11/17 1456 12/11/17 2041 12/11/17 2134 12/12/17 0531  BP: (!) 127/100  (!) 161/64 (!) 89/60  Pulse: 69  75 73  Resp: 18  18 18   Temp: 98.3 F (36.8 C)  98.4 F (36.9 C) 98.4 F (36.9 C)  TempSrc:   Oral Oral  SpO2: 95% 95% 97% 95%  Weight:    (!) 152.3 kg (335 lb 12.8 oz)  Height:        Intake/Output Summary (Last 24 hours) at 12/12/2017 0748 Last data filed at 12/12/2017 0725 Gross per 24 hour  Intake 963 ml  Output 2900 ml  Net -1937 ml   Filed Weights   12/10/17 0600 12/11/17 0539 12/12/17 0531  Weight: (!) 151.8 kg (334 lb 10.5 oz) (!) 152.7 kg (336 lb 11.2 oz) (!) 152.3 kg (335 lb 12.8 oz)    Exam: General: Morbidly obese female sitting up in a chair in no acute distress. Cardiovascular: Heart sounds show a regular rate, and rhythm. No gallops or rubs. No murmurs. No JVD. Lungs: Diminished throughout with bibasilar crackles. Abdomen: Massive obesity with anasarca. Skin: Warm and dry. No rashes or lesions. Extremities: No clubbing or cyanosis. 2+ tight edema. Pedal pulses 2+.  Data Reviewed:   I have personally reviewed following labs and imaging studies:  Labs: Labs  show the following:   Basic Metabolic Panel: Recent Labs  Lab 12/06/17 0400 12/07/17 0620 12/08/17 0536 12/09/17 0653  12/10/17 0649 12/11/17 0646 12/12/17 0507  NA  --  132* 131* 135  --  131* 130* 132*  K  --  2.4* 2.7* 2.5*   < > 3.1* 3.1* 3.2*  CL  --  85* 87* 88*  --  87* 87* 86*  CO2  --  33* 32 34*  --  32 31 30  GLUCOSE  --  77 104* 85  --  90 113* 103*  BUN  --  22* 22* 19   --  19 19 17   CREATININE  --  0.88 0.93 0.78  --  0.79 0.76 0.73  CALCIUM  --  8.6* 8.7* 8.9  --  8.9 8.4* 8.8*  MG 1.8 1.8 1.8  --   --   --   --   --   PHOS 4.0  --   --   --   --   --   --   --    < > = values in this interval not displayed.   GFR Estimated Creatinine Clearance: 103.7 mL/min (by C-G formula based on SCr of 0.73 mg/dL).  CBC: Recent Labs  Lab 12/05/17 1422 12/07/17 0620 12/11/17 0646  WBC 5.3 5.5 5.4  HGB 15.4* 15.1* 15.2*  HCT 48.6* 47.4* 46.8*  MCV 92.7 91.9 91.8  PLT 155 155 155   Cardiac Enzymes: Recent Labs  Lab 12/05/17 2032 12/06/17 0241 12/06/17 0851  TROPONINI 0.04* 0.05* 0.06*   CBG: Recent Labs  Lab 12/10/17 2209 12/11/17 0728 12/11/17 1117 12/11/17 1622 12/11/17 2121  GLUCAP 121* 127* 216* 127* 126*    Microbiology No results found for this or any previous visit (from the past 240 hour(s)).  Procedures and diagnostic studies:  No results found.  Medications:   . aspirin EC  81 mg Oral Daily  . carvedilol  6.25 mg Oral BID WC  . docusate sodium  100 mg Oral BID  . enoxaparin (LOVENOX) injection  80 mg Subcutaneous Q24H  . furosemide  80 mg Intravenous TID  . insulin aspart  0-9 Units Subcutaneous TID WC  . lisinopril  2.5 mg Oral Daily  . pantoprazole  40 mg Oral Q0600  . potassium chloride  40 mEq Oral TID  . sodium chloride flush  3 mL Intravenous Q12H  . spironolactone  25 mg Oral Daily   Continuous Infusions: . sodium chloride       LOS: 7 days   Hillery Aldo  [facility] Pager [phone]. If unable to reach me by pager, please call my cell phone at [phone].  *Please refer to [email or web address], password [email or web address] to get updated schedule on who will round on this patient, as hospitalists switch teams weekly. If 7PM-7AM, please contact night-coverage at [email or web address], password [email or web address] for any overnight needs.  12/12/2017, 7:48 AM

## 2017-12-12 NOTE — Progress Notes (Signed)
Contacted Dr. Darnelle Catalan by text two times concerning skin tears to right chest from removal of tele stickers.

## 2017-12-12 NOTE — Progress Notes (Signed)
Progress Note  Patient Name: Donna Graves Date of Encounter: 12/12/2017  Primary Cardiologist: Dina Rich, MD   Subjective   SOB improving. Inpatient Medications    Scheduled Meds: . aspirin EC  81 mg Oral Daily  . carvedilol  6.25 mg Oral BID WC  . docusate sodium  100 mg Oral BID  . enoxaparin (LOVENOX) injection  80 mg Subcutaneous Q24H  . furosemide  80 mg Intravenous TID  . insulin aspart  0-9 Units Subcutaneous TID WC  . lisinopril  2.5 mg Oral Daily  . pantoprazole  40 mg Oral Q0600  . potassium chloride  40 mEq Oral QID  . sodium chloride flush  3 mL Intravenous Q12H  . spironolactone  25 mg Oral Daily   Continuous Infusions: . sodium chloride     PRN Meds: sodium chloride, acetaminophen, alum & mag hydroxide-simeth, bisacodyl, ondansetron (ZOFRAN) IV, polyethylene glycol, sodium chloride flush, sodium phosphate   Vital Signs    Vitals:   12/11/17 1456 12/11/17 2041 12/11/17 2134 12/12/17 0531  BP: (!) 127/100  (!) 161/64 (!) 89/60  Pulse: 69  75 73  Resp: 18  18 18   Temp: 98.3 F (36.8 C)  98.4 F (36.9 C) 98.4 F (36.9 C)  TempSrc:   Oral Oral  SpO2: 95% 95% 97% 95%  Weight:    (!) 335 lb 12.8 oz (152.3 kg)  Height:        Intake/Output Summary (Last 24 hours) at 12/12/2017 0817 Last data filed at 12/12/2017 0725 Gross per 24 hour  Intake 963 ml  Output 2900 ml  Net -1937 ml   Filed Weights   12/10/17 0600 12/11/17 0539 12/12/17 0531  Weight: (!) 334 lb 10.5 oz (151.8 kg) (!) 336 lb 11.2 oz (152.7 kg) (!) 335 lb 12.8 oz (152.3 kg)    Telemetry    SR - Personally Reviewed  ECG    na  Physical Exam   GEN: No acute distress.   Neck:elevated JVD Cardiac: RRR, no murmurs, rubs, or gallops.  Respiratory: Clear to auscultation bilaterally. GI: Soft, nontender, non-distended  MS: 1-2+ bilateral edema; No deformity. Neuro:  Nonfocal  Psych: Normal affect   Labs    Chemistry Recent Labs  Lab 12/10/17 0649 12/11/17 0646  12/12/17 0507  NA 131* 130* 132*  K 3.1* 3.1* 3.2*  CL 87* 87* 86*  CO2 32 31 30  GLUCOSE 90 113* 103*  BUN 19 19 17   CREATININE 0.79 0.76 0.73  CALCIUM 8.9 8.4* 8.8*  GFRNONAA >60 >60 >60  GFRAA >60 >60 >60  ANIONGAP 12 12 16*     Hematology Recent Labs  Lab 12/05/17 1422 12/07/17 0620 12/11/17 0646  WBC 5.3 5.5 5.4  RBC 5.24* 5.16* 5.10  HGB 15.4* 15.1* 15.2*  HCT 48.6* 47.4* 46.8*  MCV 92.7 91.9 91.8  MCH 29.4 29.3 29.8  MCHC 31.7 31.9 32.5  RDW 17.0* 17.2* 16.9*  PLT 155 155 155    Cardiac Enzymes Recent Labs  Lab 12/05/17 2032 12/06/17 0241 12/06/17 0851  TROPONINI 0.04* 0.05* 0.06*   No results for input(s): TROPIPOC in the last 168 hours.   BNP Recent Labs  Lab 12/05/17 1422  BNP 400.0*     DDimer No results for input(s): DDIMER in the last 168 hours.   Radiology    No results found.  Cardiac Studies     Patient Profile     67 y.o. female admitted with acute on chronic systolic HF.  Assessment & Plan    1. Acute on chronic systolic HF - From clinic notes originally followed in [place]. Apparently had normal cath in early 2000s, LVEF around that time was 30-35%. - 07/2013 [facility]: CI 2, mean PA 27, PCWP 27, nonobstructive CAD - - BiV upgrade 05/2017, from notes unable to place LV lead. - Jan 2019 echo LVEF 25%  -negative 1.7 L yesterday, negative 12.6 L since admission. She is on  Lasix IV 80mg  tid increased yesterday, metolazone 5mg  x 2 doses this admission. REnal function is stable. Weight had been down to 312-316 at last clnic visit, today 335 lbs. Will dose metolazone 2.5mg  today, goal would be net negative 2-3 liters per day.  - other medical therapy with coreg 6.25mg  bid, lisinopril 2.5, aldactone 25. In general soft bp's have limited medical therapy.   2. Probable OSA - needs outpatient f/u, she reports a study has been ordered by her pcp  3. Hypokalemia - replacement per primary team.   For questions or  updates, please contact [facility] Please consult [email or web address] for contact info under Cardiology/STEMI.      [name], MD  12/12/2017, 8:17 AM

## 2017-12-13 ENCOUNTER — Ambulatory Visit: Payer: Medicare Other | Admitting: Cardiology

## 2017-12-13 LAB — GLUCOSE, CAPILLARY
GLUCOSE-CAPILLARY: 162 mg/dL — AB (ref 65–99)
Glucose-Capillary: 230 mg/dL — ABNORMAL HIGH (ref 65–99)
Glucose-Capillary: 116 mg/dL — ABNORMAL HIGH (ref 65–99)
Glucose-Capillary: 243 mg/dL — ABNORMAL HIGH (ref 65–99)
Glucose-Capillary: 208 mg/dL — ABNORMAL HIGH (ref 65–99)

## 2017-12-13 LAB — BASIC METABOLIC PANEL
ANION GAP: 15 (ref 5–15)
BUN: 15 mg/dL (ref 6–20)
CO2: 30 mmol/L (ref 22–32)
Calcium: 8.8 mg/dL — ABNORMAL LOW (ref 8.9–10.3)
Chloride: 87 mmol/L — ABNORMAL LOW (ref 101–111)
Creatinine, Ser: 0.78 mg/dL (ref 0.44–1.00)
GFR calc Af Amer: >60 mL/min (ref >60)
GFR calc non Af Amer: >60 mL/min (ref >60)
GLUCOSE: 119 mg/dL — AB (ref 65–99)
Potassium: 3 mmol/L — ABNORMAL LOW (ref 3.5–5.1)
Sodium: 132 mmol/L — ABNORMAL LOW (ref 135–145)

## 2017-12-13 LAB — MAGNESIUM: MAGNESIUM: 1.8 mg/dL (ref 1.7–2.4)

## 2017-12-13 MED ORDER — METOLAZONE 5 MG PO TABS
2.5000 mg | ORAL_TABLET | Freq: Once | ORAL | Status: AC
  Administered 2017-12-13: 2.5 mg via ORAL
  Filled 2017-12-13: qty 1

## 2017-12-13 MED ORDER — NEOMYCIN-POLYMYXIN-PRAMOXINE 1 % EX CREA: TOPICAL_CREAM | Freq: Two times a day (BID) | CUTANEOUS | Status: DC

## 2017-12-13 MED ORDER — BACITRACIN-NEOMYCIN-POLYMYXIN 400-5-5000 EX OINT
TOPICAL_OINTMENT | Freq: Two times a day (BID) | CUTANEOUS | Status: DC
  Administered 2017-12-13 – 2017-12-15 (×5): 1 via TOPICAL
  Administered 2017-12-15: 09:00:00 via TOPICAL
  Administered 2017-12-16 – 2017-12-24 (×16): 1 via TOPICAL
  Administered 2017-12-25: 10:00:00 via TOPICAL
  Filled 2017-12-13 (×22): qty 1

## 2017-12-13 NOTE — Progress Notes (Signed)
Physical Therapy Treatment Patient Details Name: Donna Graves MRN: [id number] DOB: 20-Feb-1951 Today's Date: 12/13/2017    History of Present Illness 67 y.o. female was admitted with acute CHF and 27# weight gain with reduction in K+, had K+ repleted and is receiving lasix for edema.  EF 25%, PMHx:  PAF, HTN, morbid obesity, CHF, cellulitis panniculus, nonischemic cardiomyopathy     PT Comments    Patient agreeable for therapy and demonstrates good return for completing BLE rom exercises with VC's while seated at bedside, able to ambulate in hallway without loss of balance, but limited mostly due to SOB and fatigue.  Patient will benefit from continued physical therapy in hospital and recommended venue below to increase strength, balance, endurance for safe ADLs and gait.   Follow Up Recommendations  Home health PT;Supervision for mobility/OOB     Equipment Recommendations  None recommended by PT    Recommendations for Other Services       Precautions / Restrictions Precautions Precautions: Fall Restrictions Weight Bearing Restrictions: No    Mobility  Bed Mobility Overal bed mobility: Needs Assistance Bed Mobility: Supine to Sit     Supine to sit: Min guard Sit to supine: Supervision   General bed mobility comments: required assist to pull self up to sitting  Transfers Overall transfer level: Needs assistance Equipment used: Rolling walker (2 wheeled) Transfers: Sit to/from [employer] Sit to Stand: Supervision Stand pivot transfers: Supervision       General transfer comment: slightly labored movement  Ambulation/Gait Ambulation/Gait assistance: Supervision Ambulation Distance (Feet): 55 Feet Assistive device: Rolling walker (2 wheeled) Gait Pattern/deviations: Decreased step length - right;Decreased step length - left;Decreased stride length   Gait velocity interpretation: Below normal speed for age/gender General Gait Details:  demonstrates slightly labored slow cadence without loss of balance, mostly limited due to SOB while on room air   Stairs            Wheelchair Mobility    Modified Rankin (Stroke Patients Only)       Balance Overall balance assessment: Needs assistance Sitting-balance support: Feet supported;No upper extremity supported Sitting balance-Leahy Scale: Good     Standing balance support: Bilateral upper extremity supported;During functional activity Standing balance-Leahy Scale: Fair Standing balance comment: fair/good with RW                            Cognition Arousal/Alertness: Awake/alert Behavior During Therapy: WFL for tasks assessed/performed Overall Cognitive Status: Within Functional Limits for tasks assessed                                        Exercises General Exercises - Lower Extremity Long Arc Quad: Seated;AROM;Strengthening;Both;10 reps Hip Flexion/Marching: Seated;AROM;Strengthening;Both;10 reps Toe Raises: Seated;AROM;Strengthening;Both;10 reps Heel Raises: Seated;AROM;Strengthening;Both;10 reps    General Comments        Pertinent Vitals/Pain Pain Assessment: No/denies pain    Home Living                      Prior Function            PT Goals (current goals can now be found in the care plan section) Acute Rehab PT Goals Patient Stated Goal: to walk safely PT Goal Formulation: With patient/family Time For Goal Achievement: 12/21/17 Potential to Achieve Goals: Good Progress towards PT goals: Progressing toward  goals    Frequency    Min 3X/week      PT Plan Current plan remains appropriate    Co-evaluation              AM-PAC PT "6 Clicks" Daily Activity  Outcome Measure  Difficulty turning over in bed (including adjusting bedclothes, sheets and blankets)?: None Difficulty moving from lying on back to sitting on the side of the bed? : A Little Difficulty sitting down on and  standing up from a chair with arms (e.g., wheelchair, bedside commode, etc,.)?: None Help needed moving to and from a bed to chair (including a wheelchair)?: A Little Help needed walking in hospital room?: A Little Help needed climbing 3-5 steps with a railing? : A Little 6 Click Score: 20    End of Session   Activity Tolerance: Patient tolerated treatment well;Patient limited by fatigue(Patient limited by mild SOB) Patient left: in bed;with call bell/phone within reach;with family/visitor present(seated at bedside) Nurse Communication: Mobility status PT Visit Diagnosis: Unsteadiness on feet (R26.81);Other abnormalities of gait and mobility (R26.89)     Time: 1351-1416 PT Time Calculation (min) (ACUTE ONLY): 25 min  Charges:  $Therapeutic Activity: 23-37 mins                    G Codes:       2:28 PM, Dec 14, 2017 Ocie Bob, MPT Physical Therapist with [facility] 336 [phone] office [phone] mobile phone

## 2017-12-13 NOTE — H&P (Signed)
[facility] PROGRESS NOTE  Donna Graves ZOX:[id number] DOB: 05-19-1951 DOA: 12/05/2017 PCP: Wilson Singer, MD  Brief summary  67 y.o. female with a PMH of chronic systolic CHF, EF 81%, status post AICD,morbid obesity, diabetes and atrial fibrillation who was admitted 12/05/17 for evaluation of a 27 pound weight gain associated with shortness of breath and swelling.  Assessment/Plan:  Acute on chronic systolic congestive heart failure (HCC)/cardiomyopathyanasarca/elevated troponin/demand ischemia. Noted to have an EF of 25% by echo done 10/17/17. Status post AICD. Has diuresed well on Lasix, metolazone and spironolactone. Currently only on Lasix and spironolactone. She is down approximately 13 L since admission.  Lasix increased to 80 mg 3 times daily by cardiology on 12/11/17 in the metolazone.  Soft BP has limited more aggressive diuresis however renal function stable.  Continue to monitor renal function and electrolytes closely while on high-dose diuretic therapy. Remains volume overloaded. Slowly improving   Hyponatremia Likely from CHF physiology. Slowly improving.  Type II diabetes mellitus (HCC) Hemoglobin A1c 7.8% on 08/18/17.currently being managed with insulin sensitive scale 3 times a day.CBGs 121-216.  AF (paroxysmal atrial fibrillation) (HCC) Rate controlled on Coreg. Continue aspirin.  Essential hypertension Blood pressure soft at times.remains on lisinopril, Coreg and diuretics.  Hypokalemia Increase supplementation dose to 40 mEq QID.  Morbid obesity (HCC)/probable OSA Body mass index is 57.64 kg/m.  Needs an outpatient sleep study.    Code Status: full Family Communication: d/w patient, her family  (indicate person spoken with, relationship, and if by phone, the number) Disposition Plan: home when stable    Consultants:  Cardiology   Procedures:  Echo   Antibiotics:  none (indicate start date, and stop date if  known)  HPI/Subjective: Reports some improvement in her breathing. Still congested, fluid overload   Objective: Vitals:   12/12/17 2245 12/13/17 0547  BP:  (!) 90/52  Pulse:  71  Resp:    Temp:  97.6 F (36.4 C)  SpO2: 96% 91%    Intake/Output Summary (Last 24 hours) at 12/13/2017 1248 Last data filed at 12/13/2017 1000 Gross per 24 hour  Intake 480 ml  Output 4400 ml  Net -3920 ml   Filed Weights   12/11/17 0539 12/12/17 0531 12/13/17 0547  Weight: (!) 152.7 kg (336 lb 11.2 oz) (!) 152.3 kg (335 lb 12.8 oz) (!) 151.8 kg (334 lb 10.5 oz)    Exam:   General:  No distress   Cardiovascular: s1,s2 rrr  Respiratory: crackles LL  Abdomen: obese, nt  Musculoskeletal: +edema    Data Reviewed: Basic Metabolic Panel: Recent Labs  Lab 12/07/17 0620 12/08/17 0536 12/09/17 1914 12/09/17 1903 12/10/17 0649 12/11/17 0646 12/12/17 0507 12/13/17 0623  NA 132* 131* 135  --  131* 130* 132* 132*  K 2.4* 2.7* 2.5* 2.9* 3.1* 3.1* 3.2* 3.0*  CL 85* 87* 88*  --  87* 87* 86* 87*  CO2 33* 32 34*  --  32 31 30 30   GLUCOSE 77 104* 85  --  90 113* 103* 119*  BUN 22* 22* 19  --  19 19 17 15   CREATININE 0.88 0.93 0.78  --  0.79 0.76 0.73 0.78  CALCIUM 8.6* 8.7* 8.9  --  8.9 8.4* 8.8* 8.8*  MG 1.8 1.8  --   --   --   --   --  1.8   Liver Function Tests: No results for input(s): AST, ALT, ALKPHOS, BILITOT, PROT, ALBUMIN in the last 168 hours. No results for input(s): LIPASE,  AMYLASE in the last 168 hours. No results for input(s): AMMONIA in the last 168 hours. CBC: Recent Labs  Lab 12/07/17 0620 12/11/17 0646  WBC 5.5 5.4  HGB 15.1* 15.2*  HCT 47.4* 46.8*  MCV 91.9 91.8  PLT 155 155   Cardiac Enzymes: No results for input(s): CKTOTAL, CKMB, CKMBINDEX, TROPONINI in the last 168 hours. BNP (last 3 results) Recent Labs    10/26/17 1611 11/17/17 1220 12/05/17 1422  BNP 395.0* 480.0* 400.0*    ProBNP (last 3 results) No results for input(s): PROBNP in the last 8760  hours.  CBG: Recent Labs  Lab 12/12/17 1233 12/12/17 1824 12/12/17 2131 12/13/17 0722 12/13/17 1107  GLUCAP 243* 230* 149* 116* 243*    No results found for this or any previous visit (from the past 240 hour(s)).   Studies: No results found.  Scheduled Meds: . aspirin EC  81 mg Oral Daily  . carvedilol  6.25 mg Oral BID WC  . docusate sodium  100 mg Oral BID  . enoxaparin (LOVENOX) injection  80 mg Subcutaneous Q24H  . furosemide  80 mg Intravenous TID  . insulin aspart  0-9 Units Subcutaneous TID WC  . lisinopril  2.5 mg Oral Daily  . neomycin-polymyxin-pramoxine   Topical BID  . pantoprazole  40 mg Oral Q0600  . potassium chloride  40 mEq Oral QID  . sodium chloride flush  3 mL Intravenous Q12H  . spironolactone  25 mg Oral Daily   Continuous Infusions: . sodium chloride      Principal Problem:   Acute on chronic congestive heart failure (HCC) Active Problems:   Type II diabetes mellitus (HCC)   AF (paroxysmal atrial fibrillation) (HCC)   Essential hypertension   Elevated troponin   Morbid obesity (HCC)   CHF (congestive heart failure) (HCC)   Anasarca   Hypokalemia   Cardiomyopathy (HCC)   Demand ischemia (HCC)   Hyponatremia    Time spent: >35 minutes     [name]  [facility] Pager [phone]. If 7PM-7AM, please contact night-coverage at [email or web address], password [facility] 12/13/2017, 12:48 PM  LOS: 8 days

## 2017-12-13 NOTE — Progress Notes (Signed)
Progress Note  Patient Name: Donna Graves Date of Encounter: 12/13/2017  Primary Cardiologist: Dina Rich, MD   Subjective   SOB is improving  Inpatient Medications    Scheduled Meds: . aspirin EC  81 mg Oral Daily  . carvedilol  6.25 mg Oral BID WC  . docusate sodium  100 mg Oral BID  . enoxaparin (LOVENOX) injection  80 mg Subcutaneous Q24H  . furosemide  80 mg Intravenous TID  . insulin aspart  0-9 Units Subcutaneous TID WC  . lisinopril  2.5 mg Oral Daily  . pantoprazole  40 mg Oral Q0600  . potassium chloride  40 mEq Oral QID  . sodium chloride flush  3 mL Intravenous Q12H  . spironolactone  25 mg Oral Daily   Continuous Infusions: . sodium chloride     PRN Meds: sodium chloride, acetaminophen, alum & mag hydroxide-simeth, bisacodyl, ondansetron (ZOFRAN) IV, polyethylene glycol, sodium chloride flush, sodium phosphate   Vital Signs    Vitals:   12/12/17 1531 12/12/17 2018 12/12/17 2245 12/13/17 0547  BP: (!) 142/74 93/62  (!) 90/52  Pulse: 72 70  71  Resp: 20     Temp: 97.9 F (36.6 C)   97.6 F (36.4 C)  TempSrc: Oral Oral  Oral  SpO2: 98% 94% 96% 91%  Weight:    (!) 334 lb 10.5 oz (151.8 kg)  Height:        Intake/Output Summary (Last 24 hours) at 12/13/2017 0817 Last data filed at 12/13/2017 0300 Gross per 24 hour  Intake 720 ml  Output 3600 ml  Net -2880 ml   Filed Weights   12/11/17 0539 12/12/17 0531 12/13/17 0547  Weight: (!) 336 lb 11.2 oz (152.7 kg) (!) 335 lb 12.8 oz (152.3 kg) (!) 334 lb 10.5 oz (151.8 kg)    Telemetry    SR- Personally Reviewed  ECG    na  Physical Exam   GEN: No acute distress.   Neck: elevated JVD Cardiac: RRR, no murmurs, rubs, or gallops.  Respiratory: Clear to auscultation bilaterally. GI: Soft, nontender, non-distended  MS: 1-2+ bilatearl LE edema; No deformity. Neuro:  Nonfocal  Psych: Normal affect   Labs    Chemistry Recent Labs  Lab 12/11/17 0646 12/12/17 0507 12/13/17 0623    NA 130* 132* 132*  K 3.1* 3.2* 3.0*  CL 87* 86* 87*  CO2 31 30 30   GLUCOSE 113* 103* 119*  BUN 19 17 15   CREATININE 0.76 0.73 0.78  CALCIUM 8.4* 8.8* 8.8*  GFRNONAA >60 >60 >60  GFRAA >60 >60 >60  ANIONGAP 12 16* 15     Hematology Recent Labs  Lab 12/07/17 0620 12/11/17 0646  WBC 5.5 5.4  RBC 5.16* 5.10  HGB 15.1* 15.2*  HCT 47.4* 46.8*  MCV 91.9 91.8  MCH 29.3 29.8  MCHC 31.9 32.5  RDW 17.2* 16.9*  PLT 155 155    Cardiac Enzymes Recent Labs  Lab 12/06/17 0851  TROPONINI 0.06*   No results for input(s): TROPIPOC in the last 168 hours.   BNPNo results for input(s): BNP, PROBNP in the last 168 hours.   DDimer No results for input(s): DDIMER in the last 168 hours.   Radiology    No results found.  Cardiac Studies    Patient Profile     67 y.o. female admitted with acute on chronic systolic HF.    Assessment & Plan    1. Acute on chronic systolic HF -From clinic notes originally followed in  [place]. Apparently had normal cath in early 2000s, LVEF around that time was 30-35%. - 07/2013 [facility]: CI 2, mean PA 27, PCWP 27, nonobstructive CAD - - BiV upgrade 05/2017, from notes unable to place LV lead. -Jan 2019 echo LVEF 25%  -negative 3 L yesterday, negative 15.7 L since admission. She is on Lasix IV 80mg  tid and received oral metolazone 2.5mg  yesterday. Renal function remains stable. Redose metolazone 2.5mg  today. Remains volume overloaded continue IV diuresis . Weight had been down to 312-316 at last clnic visit, today 334 lbs.  - other medical therapy with coreg 6.25mg  bid, lisinopril 2.5, aldactone 25. In general soft bp's have limited medical therapy.   2. Probable OSA - needs outpatient f/u, she reports a study has been ordered by her pcp  3. Hypokalemia - replacement per primary team.     For questions or updates, please contact [facility] Please consult [email or web address] for contact info under Cardiology/STEMI.       [name], MD  12/13/2017, 8:17 AM

## 2017-12-13 NOTE — Care Management Important Message (Signed)
Important Message  Patient Details  Name: Donna Graves MRN: [id number] Date of Birth: 1951/05/22   Medicare Important Message Given:  Yes    Renie Ora 12/13/2017, 11:19 AM

## 2017-12-14 DIAGNOSIS — I959 Hypotension, unspecified: Secondary | ICD-10-CM

## 2017-12-14 LAB — BASIC METABOLIC PANEL
Anion gap: 13 (ref 5–15)
BUN: 16 mg/dL (ref 6–20)
CHLORIDE: 86 mmol/L — AB (ref 101–111)
CO2: 32 mmol/L (ref 22–32)
Calcium: 8.6 mg/dL — ABNORMAL LOW (ref 8.9–10.3)
Creatinine, Ser: 0.8 mg/dL (ref 0.44–1.00)
GFR calc Af Amer: >60 mL/min (ref >60)
GLUCOSE: 133 mg/dL — AB (ref 65–99)
Potassium: 3.6 mmol/L (ref 3.5–5.1)
Sodium: 131 mmol/L — ABNORMAL LOW (ref 135–145)

## 2017-12-14 LAB — CBC
HEMATOCRIT: 47.4 % — AB (ref 36.0–46.0)
Hemoglobin: 14.9 g/dL (ref 12.0–15.0)
MCH: 29 pg (ref 26.0–34.0)
MCHC: 31.4 g/dL (ref 30.0–36.0)
MCV: 92.4 fL (ref 78.0–100.0)
PLATELETS: 176 10*3/uL (ref 150–400)
RBC: 5.13 MIL/uL — ABNORMAL HIGH (ref 3.87–5.11)
RDW: 17.2 % — ABNORMAL HIGH (ref 11.5–15.5)
WBC: 5.8 10*3/uL (ref 4.0–10.5)

## 2017-12-14 LAB — GLUCOSE, CAPILLARY
GLUCOSE-CAPILLARY: 183 mg/dL — AB (ref 65–99)
GLUCOSE-CAPILLARY: 163 mg/dL — AB (ref 65–99)
Glucose-Capillary: 141 mg/dL — ABNORMAL HIGH (ref 65–99)
Glucose-Capillary: 209 mg/dL — ABNORMAL HIGH (ref 65–99)

## 2017-12-14 MED ORDER — SPIRONOLACTONE 25 MG PO TABS
12.5000 mg | ORAL_TABLET | Freq: Every day | ORAL | Status: DC
  Administered 2017-12-15 – 2017-12-25 (×11): 12.5 mg via ORAL
  Filled 2017-12-14: qty 1
  Filled 2017-12-14: qty 0.5
  Filled 2017-12-14 (×16): qty 1
  Filled 2017-12-14: qty 0.5
  Filled 2017-12-14: qty 1

## 2017-12-14 NOTE — Progress Notes (Signed)
ANTICOAGULATION CONSULT NOTE   Pharmacy Consult for lovenox Indication: VTE prophylaxis  Allergies  Allergen Reactions  . Codeine Rash   Patient Measurements: Height: 5\' 4"  (162.6 cm) Weight: (!) 334 lb 10.5 oz (151.8 kg) IBW/kg (Calculated) : 54.7  Vital Signs: Temp: 97.7 F (36.5 C) (03/28 0445) Temp Source: Oral (03/28 0445) BP: 108/72 (03/28 1200) Pulse Rate: 90 (03/28 1040)  Labs: Recent Labs    12/12/17 0507 12/13/17 0623 12/14/17 0538  HGB  --   --  14.9  HCT  --   --  47.4*  PLT  --   --  176  CREATININE 0.73 0.78 0.80   Estimated Creatinine Clearance: 103.5 mL/min (by C-G formula based on SCr of 0.8 mg/dL).  Medical History: Past Medical History:  Diagnosis Date  . Atrial fibrillation and flutter (HCC)    History of RFA and ultimately AV node ablation  . Cardiomyopathy (HCC)    a. LVEF 25-30% by echo in 04/2017.  Marland Kitchen CHF (congestive heart failure) (HCC)   . Chronic systolic heart failure (HCC)   . Depression   . GERD (gastroesophageal reflux disease)   . Gout   . History of cardiac catheterization 07/23/2013   RA 17; RV 43/20 PCWP 27.  LAD normal  LCx  norma  RCA normal.  LVEF 55%  . Hyperlipidemia   . Hypertension   . Hypothyroidism   . 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 Ladona Ridgel  . Type 2 diabetes mellitus (HCC)    Medications:  Medications Prior to Admission  Medication Sig Dispense Refill Last Dose  . acetaminophen (TYLENOL) 650 MG CR tablet Take 650-1,300 mg by mouth every 8 (eight) hours as needed for pain.   12/04/2017 at Unknown time  . acidophilus (RISAQUAD) CAPS capsule Take 1 capsule by mouth daily. ULTIMATE FLORA PROBIOTIC   12/05/2017 at Unknown time  . ARMOUR THYROID 15 MG tablet Take 1 tablet by mouth daily.  2 12/05/2017 at Unknown time  . aspirin EC 81 MG EC tablet Take 1 tablet (81 mg total) by mouth daily.   12/05/2017 at Unknown time  . carvedilol (COREG)  6.25 MG tablet Take 1 tablet (6.25 mg total) by mouth 2 (two) times daily with a meal. 180 tablet 1 12/05/2017 at 0800  . Cholecalciferol (VITAMIN D PO) Take 7,000 Units by mouth daily.   12/05/2017 at Unknown time  . dicyclomine (BENTYL) 10 MG capsule Take 10 mg by mouth 3 (three) times daily as needed for spasms.   12/05/2017 at Unknown time  . glimepiride (AMARYL) 2 MG tablet Take 4 mg by mouth daily. BEFORE A MEAL   12/05/2017 at Unknown time  . lisinopril (PRINIVIL,ZESTRIL) 2.5 MG tablet Take 1 tablet (2.5 mg total) by mouth daily. 90 tablet 1 12/05/2017 at Unknown time  . magnesium oxide (MAG-OX) 400 MG tablet Take 400 mg by mouth daily.   12/05/2017 at Unknown time  . metolazone (ZAROXOLYN) 5 MG tablet Take 5 mg by mouth. 12/04/17 Per patient, PCP ordered Metolazone 1 tablet twice a week.   12/05/2017 at Unknown time  . Misc Natural Products (BLACK CHERRY CONCENTRATE PO) Take 2 tablets by mouth daily.    12/05/2017 at Unknown time  . Multiple Vitamins-Minerals (ALIVE ONCE DAILY WOMENS 50+ PO) Take 1 tablet by mouth daily.   12/05/2017 at Unknown time  . omeprazole (PRILOSEC) 20 MG capsule Take 20 mg by mouth every morning.   unknown  .  polycarbophil (FIBERCON) 625 MG tablet Take 625 mg by mouth 4 (four) times daily.   12/05/2017 at Unknown time  . potassium chloride SA (K-DUR,KLOR-CON) 20 MEQ tablet Take 1 tablet (20 mEq total) by mouth daily. (Patient taking differently: Take 20 mEq by mouth daily. 12/04/17 Patient reported Dr Karilyn Cota increased to 5 tablets three times a day.) 30 tablet 2 12/05/2017 at Unknown time  . Red Yeast Rice Extract (RED YEAST RICE PO) Take 2 tablets by mouth daily.    12/05/2017 at Unknown time  . simvastatin (ZOCOR) 40 MG tablet Take 40 mg by mouth every evening.    12/04/2017 at Unknown time  . torsemide (DEMADEX) 20 MG tablet Take 4 tablets (80 mg total) by mouth 2 (two) times daily. 240 tablet 0 12/05/2017 at Unknown time   Assessment: 67 y.o.femalewith medical history  significant ofCongestive heart failure with a EF of 25%, morbid obesity, AICD, diabetes, A. fib comes in with over a week of increased weight gain of over 27 pounds along with shortness of breath and swelling all over.  Patient found to be in CHF exacerbation. Pharmacy asked to dose lovenox for DVT px  Goal of Therapy:  Monitor platelets by anticoagulation protocol: Yes   Plan:  Continue lovenox 0.5mg /kg (dose is 80mg ) sq q24h Monitor for s/s of bleeding/platelets and labs   Judeth Cornfield, PharmD Clinical Pharmacist 12/14/2017 1:12 PM

## 2017-12-14 NOTE — Progress Notes (Signed)
Progress Note  Patient Name: Donna Graves Date of Encounter: 12/14/2017  Primary Cardiologist: Dina Rich, MD   Subjective   SOB improving.   Inpatient Medications    Scheduled Meds: . aspirin EC  81 mg Oral Daily  . carvedilol  6.25 mg Oral BID WC  . docusate sodium  100 mg Oral BID  . enoxaparin (LOVENOX) injection  80 mg Subcutaneous Q24H  . furosemide  80 mg Intravenous TID  . insulin aspart  0-9 Units Subcutaneous TID WC  . lisinopril  2.5 mg Oral Daily  . neomycin-bacitracin-polymyxin   Topical BID  . pantoprazole  40 mg Oral Q0600  . potassium chloride  40 mEq Oral QID  . sodium chloride flush  3 mL Intravenous Q12H  . spironolactone  25 mg Oral Daily   Continuous Infusions: . sodium chloride     PRN Meds: sodium chloride, acetaminophen, alum & mag hydroxide-simeth, bisacodyl, ondansetron (ZOFRAN) IV, polyethylene glycol, sodium chloride flush, sodium phosphate   Vital Signs    Vitals:   12/13/17 2047 12/13/17 2103 12/14/17 0321 12/14/17 0445  BP:  (!) 90/40 95/79 (!) 82/46  Pulse:  63 70 72  Resp:      Temp:  97.7 F (36.5 C)  97.7 F (36.5 C)  TempSrc:  Oral  Oral  SpO2: 98% 97%  96%  Weight:    (!) 334 lb 10.5 oz (151.8 kg)  Height:        Intake/Output Summary (Last 24 hours) at 12/14/2017 1006 Last data filed at 12/13/2017 1635 Gross per 24 hour  Intake -  Output 1500 ml  Net -1500 ml   Filed Weights   12/12/17 0531 12/13/17 0547 12/14/17 0445  Weight: (!) 335 lb 12.8 oz (152.3 kg) (!) 334 lb 10.5 oz (151.8 kg) (!) 334 lb 10.5 oz (151.8 kg)    Telemetry    A paced v sensed- Personally Reviewed  ECG    na - Personally Reviewed  Physical Exam   GEN: No acute distress.   Neck: elevated jvd Cardiac: RRR, no murmurs, rubs, or gallops.  Respiratory: Clear to auscultation bilaterally. GI: Soft, nontender, non-distended  MS: 2+ bilateral LE edema; No deformity. Neuro:  Nonfocal  Psych: Normal affect   Labs     Chemistry Recent Labs  Lab 12/12/17 0507 12/13/17 0623 12/14/17 0538  NA 132* 132* 131*  K 3.2* 3.0* 3.6  CL 86* 87* 86*  CO2 30 30 32  GLUCOSE 103* 119* 133*  BUN 17 15 16   CREATININE 0.73 0.78 0.80  CALCIUM 8.8* 8.8* 8.6*  GFRNONAA >60 >60 >60  GFRAA >60 >60 >60  ANIONGAP 16* 15 13     Hematology Recent Labs  Lab 12/11/17 0646 12/14/17 0538  WBC 5.4 5.8  RBC 5.10 5.13*  HGB 15.2* 14.9  HCT 46.8* 47.4*  MCV 91.8 92.4  MCH 29.8 29.0  MCHC 32.5 31.4  RDW 16.9* 17.2*  PLT 155 176    Cardiac EnzymesNo results for input(s): TROPONINI in the last 168 hours. No results for input(s): TROPIPOC in the last 168 hours.   BNPNo results for input(s): BNP, PROBNP in the last 168 hours.   DDimer No results for input(s): DDIMER in the last 168 hours.   Radiology    No results found.  Cardiac Studies    Patient Profile     67 y.o. female admitted with acute on chronic systolic HF.   Assessment & Plan    1. Acute on chronic  systolic HF -From clinic notes originally followed in [place]. Apparently had normal cath in early 2000s, LVEF around that time was 30-35%. - 07/2013 [facility]: CI 2, mean PA 27, PCWP 27, nonobstructive CAD - - BiV upgrade 05/2017, from notes unable to place LV lead. -Jan 2019 echo LVEF 25%  -negative2.3L yesterday, negative 18L since admission. Sh e is on Lasix IV 80mg tidand received oral metolazone 2.5mg  x last 2 days. Renal function remains stable.  . Weight had been down to 312-316 at last clnic visit, today remains 334 lbs.  - other medical therapy with coreg 6.25mg  bid, lisinopril 2.5, aldactone 25. In general soft bp's have limited medical therapy. Soft bp's, lower aldactone to 12.5mg  daily. Will not dose metolazone today.   2. Probable OSA - needs outpatient f/u, she reports a study has been ordered by her pcp     For questions or updates, please contact [facility] Please consult [email or web address] for contact info  under Cardiology/STEMI.      Donna Coddington, MD  12/14/2017, 10:06 AM

## 2017-12-14 NOTE — Progress Notes (Signed)
PROGRESS NOTE                                                                                                                                                                                                             Patient Demographics:    Donna Graves, is a 67 y.o. female, DOB - 10-11-1950, BFX:[id number]  Admit date - 12/05/2017   Admitting Physician Haydee Monica, MD  Outpatient Primary MD for the patient is Wilson Singer, MD  LOS - 9  Outpatient Specialists: Cardiology  Chief Complaint  Patient presents with  . Leg Swelling       Brief Narrative   67 year old morbidly obese female with nonischemic cardiomyopathy (EF of 25%), status post AICD, diabetes mellitus and A. fib admitted with acute on chronic systolic CHF with anasarca.   Subjective:   Reports breathing to be getting better slowly.  Still extremely volume overloaded.   Assessment  & Plan :    Principal Problem:   Acute on chronic systolic congestive heart failure (HCC) Baseline with of 315 pounds and is still over 20 pounds.. negative balance of 18 L since admission.  On IV Lasix 80 mg 3 times a day with good diuresis.  Also received low-dose metolazone last 2 days.  Continue Coreg, low-dose lisinopril.  Aldactone dose reduced due to soft blood pressure.  Metolazone held today. Monitor strict I/O and daily weight.  Cardiology consult appreciated.   Active Problems: Hypervolemic hyponatremia. Improving with aggressive diuresis.  Type 2 diabetes mellitus Last A1c of 7.8.  Continue sliding scale coverage.  CBG stable.  Paroxysmal atrial tachycardia Continue aspirin and beta-blocker.  Follows with Dr. Ladona Ridgel.  Normal interrogation in December 2018.  Hypotension Systolic blood pressure drops to the 80s.  Monitor closely on current medication.  I have asked the nurse to check blood pressure manually.  Morbid obesity BMI of 58.  Being  followed by [facility] with plan on bariatric surgery.  Hypokalemia Replenished.  Monitor daily.     Code Status : Full code  Family Communication  : Husband at bedside  Disposition Plan  : Home once diuresed adequately.  May need several more days.  Barriers For Discharge : Active symptoms Consults  : Cardiology  Procedures  : None  DVT Prophylaxis  :  Lovenox -   Lab  Results  Component Value Date   PLT 176 12/14/2017    Antibiotics  :   Anti-infectives (From admission, onward)   None        Objective:   Vitals:   12/14/17 1000 12/14/17 1040 12/14/17 1200 12/14/17 1300  BP: 103/68 107/68 108/72 (!) 118/93  Pulse: (!) 111 90  74  Resp:    18  Temp:    98.2 F (36.8 C)  TempSrc:    Oral  SpO2:    98%  Weight:      Height:        Wt Readings from Last 3 Encounters:  12/14/17 (!) 151.8 kg (334 lb 10.5 oz)  11/17/17 (!) 149.7 kg (330 lb)  11/13/17 (!) 146.3 kg (322 lb 9.6 oz)     Intake/Output Summary (Last 24 hours) at 12/14/2017 1628 Last data filed at 12/14/2017 1329 Gross per 24 hour  Intake -  Output 1700 ml  Net -1700 ml     Physical Exam  Gen: Morbidly obese female not in distress HEENT: no pallor, moist mucosa, supple neck Chest: Clear breath sounds bilaterally CVS: N S1&S2, no murmurs GI: Obese, diffuse pitting edema over the abdomen Musculoskeletal: warm, 3+ pitting edema     Data Review:    CBC Recent Labs  Lab 12/11/17 0646 12/14/17 0538  WBC 5.4 5.8  HGB 15.2* 14.9  HCT 46.8* 47.4*  PLT 155 176  MCV 91.8 92.4  MCH 29.8 29.0  MCHC 32.5 31.4  RDW 16.9* 17.2*    Chemistries  Recent Labs  Lab 12/08/17 0536  12/10/17 0649 12/11/17 0646 12/12/17 0507 12/13/17 0623 12/14/17 0538  NA 131*   < > 131* 130* 132* 132* 131*  K 2.7*   < > 3.1* 3.1* 3.2* 3.0* 3.6  CL 87*   < > 87* 87* 86* 87* 86*  CO2 32   < > 32 31 30 30  32  GLUCOSE 104*   < > 90 113* 103* 119* 133*  BUN 22*   < > 19 19 17 15 16     CREATININE 0.93   < > 0.79 0.76 0.73 0.78 0.80  CALCIUM 8.7*   < > 8.9 8.4* 8.8* 8.8* 8.6*  MG 1.8  --   --   --   --  1.8  --    < > = values in this interval not displayed.   ------------------------------------------------------------------------------------------------------------------ No results for input(s): CHOL, HDL, LDLCALC, TRIG, CHOLHDL, LDLDIRECT in the last 72 hours.  Lab Results  Component Value Date   HGBA1C 7.8 (H) 08/18/2017   ------------------------------------------------------------------------------------------------------------------ No results for input(s): TSH, T4TOTAL, T3FREE, THYROIDAB in the last 72 hours.  Invalid input(s): FREET3 ------------------------------------------------------------------------------------------------------------------ No results for input(s): VITAMINB12, FOLATE, FERRITIN, TIBC, IRON, RETICCTPCT in the last 72 hours.  Coagulation profile No results for input(s): INR, PROTIME in the last 168 hours.  No results for input(s): DDIMER in the last 72 hours.  Cardiac Enzymes No results for input(s): CKMB, TROPONINI, MYOGLOBIN in the last 168 hours.  Invalid input(s): CK ------------------------------------------------------------------------------------------------------------------    Component Value Date/Time   BNP 400.0 (H) 12/05/2017 1422    Inpatient Medications  Scheduled Meds: . aspirin EC  81 mg Oral Daily  . carvedilol  6.25 mg Oral BID WC  . docusate sodium  100 mg Oral BID  . enoxaparin (LOVENOX) injection  80 mg Subcutaneous Q24H  . furosemide  80 mg Intravenous TID  . insulin aspart  0-9 Units Subcutaneous TID WC  . lisinopril  2.5 mg Oral Daily  . neomycin-bacitracin-polymyxin   Topical BID  . pantoprazole  40 mg Oral Q0600  . potassium chloride  40 mEq Oral QID  . sodium chloride flush  3 mL Intravenous Q12H  . [START ON 12/15/2017] spironolactone  12.5 mg Oral Daily   Continuous Infusions: . sodium  chloride     PRN Meds:.sodium chloride, acetaminophen, alum & mag hydroxide-simeth, bisacodyl, ondansetron (ZOFRAN) IV, polyethylene glycol, sodium chloride flush, sodium phosphate  Micro Results No results found for this or any previous visit (from the past 240 hour(s)).  Radiology Reports Dg Chest 2 View  Result Date: 12/05/2017 CLINICAL DATA:  Shortness of Breath EXAM: CHEST - 2 VIEW COMPARISON:  November 17, 2017 FINDINGS: There is no edema or consolidation. There is cardiomegaly with pulmonary venous hypertension. Pacemaker leads are attached to the right atrium, right ventricle, and coronary sinus. No adenopathy. No bone lesions. IMPRESSION: Pulmonary vascular congestion without edema or consolidation. Pacemaker leads as noted. Electronically Signed   By: Bretta Bang III M.D.   On: 12/05/2017 13:52   Dg Chest Portable 1 View  Result Date: 11/17/2017 CLINICAL DATA:  Syncope EXAM: PORTABLE CHEST 1 VIEW COMPARISON:  10/26/2017 radiographs FINDINGS: Cardiomegaly and LEFT-sided pacemaker/ICD again noted. Mild pulmonary vascular congestion noted. There is no evidence of focal airspace disease, pulmonary edema, suspicious pulmonary nodule/mass, pleural effusion, or pneumothorax. No acute bony abnormalities are identified. IMPRESSION: Cardiomegaly with mild pulmonary vascular congestion. Electronically Signed   By: Harmon Pier M.D.   On: 11/17/2017 12:45    Time Spent in minutes  25   Lesleigh Hughson M.D on 12/14/2017 at 4:28 PM  Between 7am to 7pm - Pager - [phone]  After 7pm go to [email or web address] - password [email or web address]  [facility] -  Office  [phone]

## 2017-12-14 NOTE — Care Management Note (Signed)
Case Management Note  Patient Details  Name: Donna Graves MRN: [id number] Date of Birth: 1951/07/24  If discussed at Long Length of Stay Meetings, dates discussed:  12/14/17  Additional Comments:  Malcolm Metro, RN 12/14/2017, 1:30 PM

## 2017-12-14 NOTE — Progress Notes (Signed)
Pt sitting in chair beside bed, informed pt to call before getting up. Soft B/p lasix held at this time.

## 2017-12-15 LAB — BASIC METABOLIC PANEL
Anion gap: 16 — ABNORMAL HIGH (ref 5–15)
BUN: 20 mg/dL (ref 6–20)
CO2: 30 mmol/L (ref 22–32)
CREATININE: 0.77 mg/dL (ref 0.44–1.00)
Calcium: 8.9 mg/dL (ref 8.9–10.3)
Chloride: 85 mmol/L — ABNORMAL LOW (ref 101–111)
GFR calc Af Amer: >60 mL/min (ref >60)
Glucose, Bld: 118 mg/dL — ABNORMAL HIGH (ref 65–99)
POTASSIUM: 4.3 mmol/L (ref 3.5–5.1)
Sodium: 131 mmol/L — ABNORMAL LOW (ref 135–145)

## 2017-12-15 LAB — GLUCOSE, CAPILLARY
GLUCOSE-CAPILLARY: 206 mg/dL — AB (ref 65–99)
GLUCOSE-CAPILLARY: 241 mg/dL — AB (ref 65–99)
GLUCOSE-CAPILLARY: 172 mg/dL — AB (ref 65–99)
GLUCOSE-CAPILLARY: 178 mg/dL — AB (ref 65–99)
Glucose-Capillary: 126 mg/dL — ABNORMAL HIGH (ref 65–99)

## 2017-12-15 LAB — MAGNESIUM: MAGNESIUM: 2 mg/dL (ref 1.7–2.4)

## 2017-12-15 MED ORDER — CARVEDILOL 3.125 MG PO TABS
3.1250 mg | ORAL_TABLET | Freq: Two times a day (BID) | ORAL | Status: DC
  Administered 2017-12-15 – 2017-12-25 (×20): 3.125 mg via ORAL
  Filled 2017-12-15 (×22): qty 1

## 2017-12-15 NOTE — Progress Notes (Signed)
PROGRESS NOTE                                                                                                                                                                                                             Patient Demographics:    Donna Graves, is a 67 y.o. female, DOB - Jun 14, 1951, ZMO:[id number]  Admit date - 12/05/2017   Admitting Physician Haydee Monica, MD  Outpatient Primary MD for the patient is Wilson Singer, MD  LOS - 10  Outpatient Specialists: Cardiology  Chief Complaint  Patient presents with  . Leg Swelling       Brief Narrative   67 year old morbidly obese female with nonischemic cardiomyopathy (EF of 25%), status post AICD, diabetes mellitus and A. fib admitted with acute on chronic systolic CHF with anasarca.   Subjective:   Breathing continues to improve.  Although put out 1.4 L weight has not changed compared to yesterday.   Assessment  & Plan :    Principal Problem:   Acute on chronic systolic congestive heart failure (HCC) Baseline with of 315 pounds and is still most 20 pounds over.  Negative balance of 19 L since admission. Continue IV Lasix 80 mg 3 times daily with plan on -2-3 L daily.  Cardiology consult appreciated and recommend to add low-dose metolazone if not diuresing adequately. Continue Coreg, lisinopril and Aldactone.  (Coreg and Aldactone dose reduced due to soft blood pressure.). Strict I/O and daily weight.     Active Problems: Hypervolemic hyponatremia. Improving with aggressive diuresis.  Type 2 diabetes mellitus Last A1c of 7.8.  Continue sliding scale coverage.  CBG stable.  Paroxysmal atrial tachycardia Continue aspirin and beta-blocker.  Follows with Dr. Ladona Ridgel.  Normal interrogation in December 2018.  Hypotension Systolic blood pressure drops to the 80s.  Monitor closely on current medication.  I have asked the nurse to check blood pressure  manually.  Morbid obesity BMI of 58.  Being followed by [facility] with plan on bariatric surgery.  Hypokalemia Replenished.  Monitor daily.     Code Status : Full code  Family Communication  : Husband at bedside  Disposition Plan  : Home once diuresed adequately.  Patient will be here for several days for adequate diuresis.  Barriers For Discharge : Active symptoms Consults  : Cardiology  Procedures  :  None  DVT Prophylaxis  :  Lovenox -   Lab Results  Component Value Date   PLT 176 12/14/2017    Antibiotics  :   Anti-infectives (From admission, onward)   None        Objective:   Vitals:   12/14/17 2046 12/15/17 0100 12/15/17 0450 12/15/17 0955  BP: (!) 82/41 92/64 (!) 83/70 (!) 116/99  Pulse: 73 68 69 70  Resp:    20  Temp: 98.1 F (36.7 C)  97.8 F (36.6 C) 98.2 F (36.8 C)  TempSrc: Oral  Oral Oral  SpO2: 97%  90% 96%  Weight:   (!) 151.8 kg (334 lb 10.5 oz)   Height:        Wt Readings from Last 3 Encounters:  12/15/17 (!) 151.8 kg (334 lb 10.5 oz)  11/17/17 (!) 149.7 kg (330 lb)  11/13/17 (!) 146.3 kg (322 lb 9.6 oz)     Intake/Output Summary (Last 24 hours) at 12/15/2017 1200 Last data filed at 12/14/2017 2145 Gross per 24 hour  Intake 123 ml  Output 1400 ml  Net -1277 ml     Physical Exam General: Morbidly obese female, not in distress HEENT: Moist mucosa, supple neck Chest: Clear to auscultation bilaterally CVS: Normal S1 and S2, no murmurs GI: Obese, diffuse pitting edema over the abdominal wall Musculoskeletal: Warm, 3+ pitting edema bilaterally    Data Review:    CBC Recent Labs  Lab 12/11/17 0646 12/14/17 0538  WBC 5.4 5.8  HGB 15.2* 14.9  HCT 46.8* 47.4*  PLT 155 176  MCV 91.8 92.4  MCH 29.8 29.0  MCHC 32.5 31.4  RDW 16.9* 17.2*    Chemistries  Recent Labs  Lab 12/11/17 0646 12/12/17 0507 12/13/17 0623 12/14/17 0538 12/15/17 0549  NA 130* 132* 132* 131* 131*  K 3.1* 3.2* 3.0* 3.6 4.3  CL  87* 86* 87* 86* 85*  CO2 31 30 30  32 30  GLUCOSE 113* 103* 119* 133* 118*  BUN 19 17 15 16 20   CREATININE 0.76 0.73 0.78 0.80 0.77  CALCIUM 8.4* 8.8* 8.8* 8.6* 8.9  MG  --   --  1.8  --   --    ------------------------------------------------------------------------------------------------------------------ No results for input(s): CHOL, HDL, LDLCALC, TRIG, CHOLHDL, LDLDIRECT in the last 72 hours.  Lab Results  Component Value Date   HGBA1C 7.8 (H) 08/18/2017   ------------------------------------------------------------------------------------------------------------------ No results for input(s): TSH, T4TOTAL, T3FREE, THYROIDAB in the last 72 hours.  Invalid input(s): FREET3 ------------------------------------------------------------------------------------------------------------------ No results for input(s): VITAMINB12, FOLATE, FERRITIN, TIBC, IRON, RETICCTPCT in the last 72 hours.  Coagulation profile No results for input(s): INR, PROTIME in the last 168 hours.  No results for input(s): DDIMER in the last 72 hours.  Cardiac Enzymes No results for input(s): CKMB, TROPONINI, MYOGLOBIN in the last 168 hours.  Invalid input(s): CK ------------------------------------------------------------------------------------------------------------------    Component Value Date/Time   BNP 400.0 (H) 12/05/2017 1422    Inpatient Medications  Scheduled Meds: . aspirin EC  81 mg Oral Daily  . carvedilol  3.125 mg Oral BID WC  . docusate sodium  100 mg Oral BID  . enoxaparin (LOVENOX) injection  80 mg Subcutaneous Q24H  . furosemide  80 mg Intravenous TID  . insulin aspart  0-9 Units Subcutaneous TID WC  . lisinopril  2.5 mg Oral Daily  . neomycin-bacitracin-polymyxin   Topical BID  . pantoprazole  40 mg Oral Q0600  . potassium chloride  40 mEq Oral QID  . sodium chloride  flush  3 mL Intravenous Q12H  . spironolactone  12.5 mg Oral Daily   Continuous Infusions: . sodium  chloride     PRN Meds:.sodium chloride, acetaminophen, alum & mag hydroxide-simeth, bisacodyl, ondansetron (ZOFRAN) IV, polyethylene glycol, sodium chloride flush, sodium phosphate  Micro Results No results found for this or any previous visit (from the past 240 hour(s)).  Radiology Reports Dg Chest 2 View  Result Date: 12/05/2017 CLINICAL DATA:  Shortness of Breath EXAM: CHEST - 2 VIEW COMPARISON:  November 17, 2017 FINDINGS: There is no edema or consolidation. There is cardiomegaly with pulmonary venous hypertension. Pacemaker leads are attached to the right atrium, right ventricle, and coronary sinus. No adenopathy. No bone lesions. IMPRESSION: Pulmonary vascular congestion without edema or consolidation. Pacemaker leads as noted. Electronically Signed   By: Bretta Bang III M.D.   On: 12/05/2017 13:52   Dg Chest Portable 1 View  Result Date: 11/17/2017 CLINICAL DATA:  Syncope EXAM: PORTABLE CHEST 1 VIEW COMPARISON:  10/26/2017 radiographs FINDINGS: Cardiomegaly and LEFT-sided pacemaker/ICD again noted. Mild pulmonary vascular congestion noted. There is no evidence of focal airspace disease, pulmonary edema, suspicious pulmonary nodule/mass, pleural effusion, or pneumothorax. No acute bony abnormalities are identified. IMPRESSION: Cardiomegaly with mild pulmonary vascular congestion. Electronically Signed   By: Harmon Pier M.D.   On: 11/17/2017 12:45    Time Spent in minutes  25   Johanne Mcglade M.D on 12/15/2017 at 12:00 PM  Between 7am to 7pm - Pager - [phone]  After 7pm go to [email or web address] - password [facility]  [facility] -  Office  [phone]

## 2017-12-15 NOTE — Care Management Important Message (Signed)
Important Message  Patient Details  Name: Donna Graves MRN: [id number] Date of Birth: 05-Mar-1951   Medicare Important Message Given:  Yes    Renie Ora 12/15/2017, 11:54 AM

## 2017-12-15 NOTE — Progress Notes (Signed)
Progress Note  Patient Name: Donna Graves Date of Encounter: 12/15/2017  Primary Cardiologist: Dina Rich, MD   Subjective   SOB continues to improve  Inpatient Medications    Scheduled Meds: . aspirin EC  81 mg Oral Daily  . carvedilol  6.25 mg Oral BID WC  . docusate sodium  100 mg Oral BID  . enoxaparin (LOVENOX) injection  80 mg Subcutaneous Q24H  . furosemide  80 mg Intravenous TID  . insulin aspart  0-9 Units Subcutaneous TID WC  . lisinopril  2.5 mg Oral Daily  . neomycin-bacitracin-polymyxin   Topical BID  . pantoprazole  40 mg Oral Q0600  . potassium chloride  40 mEq Oral QID  . sodium chloride flush  3 mL Intravenous Q12H  . spironolactone  12.5 mg Oral Daily   Continuous Infusions: . sodium chloride     PRN Meds: sodium chloride, acetaminophen, alum & mag hydroxide-simeth, bisacodyl, ondansetron (ZOFRAN) IV, polyethylene glycol, sodium chloride flush, sodium phosphate   Vital Signs    Vitals:   12/14/17 1300 12/14/17 2046 12/15/17 0100 12/15/17 0450  BP: (!) 118/93 (!) 82/41 92/64 (!) 83/70  Pulse: 74 73 68 69  Resp: 18     Temp: 98.2 F (36.8 C) 98.1 F (36.7 C)  97.8 F (36.6 C)  TempSrc: Oral Oral  Oral  SpO2: 98% 97%  90%  Weight:    (!) 334 lb 10.5 oz (151.8 kg)  Height:        Intake/Output Summary (Last 24 hours) at 12/15/2017 0912 Last data filed at 12/14/2017 2145 Gross per 24 hour  Intake 123 ml  Output 1400 ml  Net -1277 ml   Filed Weights   12/13/17 0547 12/14/17 0445 12/15/17 0450  Weight: (!) 334 lb 10.5 oz (151.8 kg) (!) 334 lb 10.5 oz (151.8 kg) (!) 334 lb 10.5 oz (151.8 kg)    Telemetry    AV paced - Personally Reviewed  ECG    na  Physical Exam   GEN: No acute distress.   Neck: No JVD Cardiac: RRR, no murmurs, rubs, or gallops.  Respiratory: Clear to auscultation bilaterally. GI: Soft, nontender, non-distended  MS: 1+ bilateral edema; No deformity. Neuro:  Nonfocal  Psych: Normal affect   Labs    Chemistry Recent Labs  Lab 12/13/17 0623 12/14/17 0538 12/15/17 0549  NA 132* 131* 131*  K 3.0* 3.6 4.3  CL 87* 86* 85*  CO2 30 32 30  GLUCOSE 119* 133* 118*  BUN 15 16 20   CREATININE 0.78 0.80 0.77  CALCIUM 8.8* 8.6* 8.9  GFRNONAA >60 >60 >60  GFRAA >60 >60 >60  ANIONGAP 15 13 16*     Hematology Recent Labs  Lab 12/11/17 0646 12/14/17 0538  WBC 5.4 5.8  RBC 5.10 5.13*  HGB 15.2* 14.9  HCT 46.8* 47.4*  MCV 91.8 92.4  MCH 29.8 29.0  MCHC 32.5 31.4  RDW 16.9* 17.2*  PLT 155 176    Cardiac EnzymesNo results for input(s): TROPONINI in the last 168 hours. No results for input(s): TROPIPOC in the last 168 hours.   BNPNo results for input(s): BNP, PROBNP in the last 168 hours.   DDimer No results for input(s): DDIMER in the last 168 hours.   Radiology    No results found.  Cardiac Studies     Patient Profile     67 y.o. female admitted with acute on chronic systolic HF.     Assessment & Plan    1.  Acute on chronic systolic HF -From clinic notes originally [place] in [place]. Apparently had normal cath in early 2000s, LVEF around that time was 30-35%. - 07/2013 [facility]: CI 2, mean PA 27, PCWP 27, nonobstructive CAD - - BiV upgrade 05/2017, from notes unable to place LV lead. -Jan 2019 echo LVEF 25%  -negative1.1L yesterday (I/Os incomplete, night shift did not document), negative 19L since admission. She is on Lasix IV 80mg tid . Weight had been down to 312-316 at last clnic visit, today remains 334lbs.  - other medical therapy with coreg 6.25mg  bid, lisinopril 2.5, aldactone 25. In general soft bp's have limited medical therapy. Soft bp's, lower aldactone to 12.5mg  daily and coreg to 3.125mg  bid.    - continue IV diuresis over the weekend, goal would be net negative 2-3 liters per day. If not then can redose 2.5mg  of metolazone. Be sure I/Os are fully documented prior to changing diuretic dosing, also monitor bp's.   2. Probable  OSA - needs outpatient f/u, she reports a study has been ordered by her pcp    For questions or updates, please contact [facility] Please consult [email or web address] for contact info under Cardiology/STEMI.      [name], MD  12/15/2017, 9:12 AM

## 2017-12-16 DIAGNOSIS — R9431 Abnormal electrocardiogram [ECG] [EKG]: Secondary | ICD-10-CM

## 2017-12-16 LAB — BASIC METABOLIC PANEL
Anion gap: 13 (ref 5–15)
BUN: 20 mg/dL (ref 6–20)
CO2: 29 mmol/L (ref 22–32)
CREATININE: 0.79 mg/dL (ref 0.44–1.00)
Calcium: 8.9 mg/dL (ref 8.9–10.3)
Chloride: 90 mmol/L — ABNORMAL LOW (ref 101–111)
GFR calc Af Amer: >60 mL/min (ref >60)
Glucose, Bld: 119 mg/dL — ABNORMAL HIGH (ref 65–99)
Potassium: 3.9 mmol/L (ref 3.5–5.1)
SODIUM: 132 mmol/L — AB (ref 135–145)

## 2017-12-16 LAB — GLUCOSE, CAPILLARY
GLUCOSE-CAPILLARY: 114 mg/dL — AB (ref 65–99)
Glucose-Capillary: 194 mg/dL — ABNORMAL HIGH (ref 65–99)
Glucose-Capillary: 200 mg/dL — ABNORMAL HIGH (ref 65–99)
Glucose-Capillary: 129 mg/dL — ABNORMAL HIGH (ref 65–99)

## 2017-12-16 MED ORDER — ENOXAPARIN SODIUM 80 MG/0.8ML ~~LOC~~ SOLN
75.0000 mg | SUBCUTANEOUS | Status: DC
  Administered 2017-12-16 – 2017-12-24 (×8): 75 mg via SUBCUTANEOUS
  Filled 2017-12-16 (×9): qty 0.8

## 2017-12-16 MED ORDER — METOLAZONE 5 MG PO TABS
2.5000 mg | ORAL_TABLET | Freq: Every day | ORAL | Status: DC
  Administered 2017-12-16 – 2017-12-17 (×2): 2.5 mg via ORAL
  Filled 2017-12-16 (×2): qty 1

## 2017-12-16 NOTE — Progress Notes (Signed)
PROGRESS NOTE                                                                                                                                                                                                             Patient Demographics:    Donna Graves, is a 67 y.o. female, DOB - 1951-09-16, GYI:[id number]  Admit date - 12/05/2017   Admitting Physician Haydee Monica, MD  Outpatient Primary MD for the patient is Wilson Singer, MD  LOS - 11  Outpatient Specialists: Cardiology  Chief Complaint  Patient presents with  . Leg Swelling       Brief Narrative   67 year old morbidly obese female with nonischemic cardiomyopathy (EF of 25%), status post AICD, diabetes mellitus and A. fib admitted with acute on chronic systolic CHF with anasarca.   Subjective:   Urine output not charted well (no record from yesterday morning).  Had a near syncopal episode yesterday.  EKG unremarkable (did a notice short run of NSVT on the monitor).  Reports feeling tired.   Assessment  & Plan :    Principal Problem:   Acute on chronic systolic congestive heart failure (HCC) Baseline with of 315 pounds and is still most 20 pounds over.  Negative balance of 19.6 L since admission. Continue IV Lasix 80 mg 3 times daily with plan on -2-3 L daily.  Cardiology consult appreciated and recommend to add low-dose metolazone if not diuresing adequately.  Will add daily metolazone 2.5 mg and monitor.  Needs strict I/O monitoring and daily weight. Continue Coreg, lisinopril and Aldactone.  (Coreg and Aldactone dose reduced due to soft blood pressure.).      Active Problems: Hypervolemic hyponatremia. Improving with aggressive diuresis.  Type 2 diabetes mellitus Last A1c of 7.8.  Continue sliding scale coverage.  CBG stable.  Paroxysmal atrial tachycardia Continue aspirin and beta-blocker.  Follows with Dr. Ladona Ridgel.  Normal interrogation in  December 2018.  Hypotension Systolic blood pressure drops to the 80s.  Monitor closely on current medication.  I have asked the nurse to check blood pressure manually.  Morbid obesity BMI of 58.  Being followed by [facility] with plan on bariatric surgery.  Hypokalemia/ NSVT Replenished.  Monitor daily.  Normal magnesium.  ?  Syncopal episode on 3/29/prolonged QT EKG unremarkable except for markedly prolonged QTC (550).  Avoid QT prolonging agents.  Keep K>4 and MG>2  Code Status : Full code  Family Communication  : None at bedside today  Disposition Plan  : Home once diuresed adequately.  Patient will be here for several days for adequate diuresis.  Barriers For Discharge : Active symptoms Consults  : Cardiology  Procedures  : None  DVT Prophylaxis  :  Lovenox -   Lab Results  Component Value Date   PLT 176 12/14/2017    Antibiotics  :   Anti-infectives (From admission, onward)   None        Objective:   Vitals:   12/15/17 1341 12/15/17 1342 12/15/17 2100 12/16/17 0621  BP: (!) 97/30 (!) 100/48 110/85 115/80  Pulse:  71 75 78  Resp:  18 18 18   Temp:   98 F (36.7 C) 98 F (36.7 C)  TempSrc:   Oral Oral  SpO2:  97% 98% 98%  Weight:    (!) 151.5 kg (334 lb)  Height:        Wt Readings from Last 3 Encounters:  12/16/17 (!) 151.5 kg (334 lb)  11/17/17 (!) 149.7 kg (330 lb)  11/13/17 (!) 146.3 kg (322 lb 9.6 oz)     Intake/Output Summary (Last 24 hours) at 12/16/2017 1132 Last data filed at 12/16/2017 0900 Gross per 24 hour  Intake 240 ml  Output 700 ml  Net -460 ml     Physical Exam General: Morbidly obese female not in distress, appears fatigued HEENT: Moist mucosa, supple neck Chest: Clear bilaterally CVS: Normal S1 and S2, no murmurs GI: Obese with diffuse pitting abdominal wall edema Musculoskeletal: Warm, 3+ pitting edema bilaterally    Data Review:    CBC Recent Labs  Lab 12/11/17 0646 12/14/17 0538  WBC 5.4 5.8    HGB 15.2* 14.9  HCT 46.8* 47.4*  PLT 155 176  MCV 91.8 92.4  MCH 29.8 29.0  MCHC 32.5 31.4  RDW 16.9* 17.2*    Chemistries  Recent Labs  Lab 12/12/17 0507 12/13/17 0623 12/14/17 0538 12/15/17 0549 12/15/17 1546 12/16/17 0707  NA 132* 132* 131* 131*  --  132*  K 3.2* 3.0* 3.6 4.3  --  3.9  CL 86* 87* 86* 85*  --  90*  CO2 30 30 32 30  --  29  GLUCOSE 103* 119* 133* 118*  --  119*  BUN 17 15 16 20   --  20  CREATININE 0.73 0.78 0.80 0.77  --  0.79  CALCIUM 8.8* 8.8* 8.6* 8.9  --  8.9  MG  --  1.8  --   --  2.0  --    ------------------------------------------------------------------------------------------------------------------ No results for input(s): CHOL, HDL, LDLCALC, TRIG, CHOLHDL, LDLDIRECT in the last 72 hours.  Lab Results  Component Value Date   HGBA1C 7.8 (H) 08/18/2017   ------------------------------------------------------------------------------------------------------------------ No results for input(s): TSH, T4TOTAL, T3FREE, THYROIDAB in the last 72 hours.  Invalid input(s): FREET3 ------------------------------------------------------------------------------------------------------------------ No results for input(s): VITAMINB12, FOLATE, FERRITIN, TIBC, IRON, RETICCTPCT in the last 72 hours.  Coagulation profile No results for input(s): INR, PROTIME in the last 168 hours.  No results for input(s): DDIMER in the last 72 hours.  Cardiac Enzymes No results for input(s): CKMB, TROPONINI, MYOGLOBIN in the last 168 hours.  Invalid input(s): CK ------------------------------------------------------------------------------------------------------------------    Component Value Date/Time   BNP 400.0 (H) 12/05/2017 1422    Inpatient Medications  Scheduled Meds: . aspirin EC  81 mg Oral Daily  . carvedilol  3.125 mg Oral BID WC  . docusate sodium  100 mg Oral BID  . enoxaparin (LOVENOX) injection  80 mg Subcutaneous Q24H  . furosemide  80 mg  Intravenous TID  . insulin aspart  0-9 Units Subcutaneous TID WC  . lisinopril  2.5 mg Oral Daily  . metolazone  2.5 mg Oral Daily  . neomycin-bacitracin-polymyxin   Topical BID  . pantoprazole  40 mg Oral Q0600  . potassium chloride  40 mEq Oral QID  . sodium chloride flush  3 mL Intravenous Q12H  . spironolactone  12.5 mg Oral Daily   Continuous Infusions: . sodium chloride     PRN Meds:.sodium chloride, acetaminophen, alum & mag hydroxide-simeth, bisacodyl, ondansetron (ZOFRAN) IV, polyethylene glycol, sodium chloride flush, sodium phosphate  Micro Results No results found for this or any previous visit (from the past 240 hour(s)).  Radiology Reports Dg Chest 2 View  Result Date: 12/05/2017 CLINICAL DATA:  Shortness of Breath EXAM: CHEST - 2 VIEW COMPARISON:  November 17, 2017 FINDINGS: There is no edema or consolidation. There is cardiomegaly with pulmonary venous hypertension. Pacemaker leads are attached to the right atrium, right ventricle, and coronary sinus. No adenopathy. No bone lesions. IMPRESSION: Pulmonary vascular congestion without edema or consolidation. Pacemaker leads as noted. Electronically Signed   By: Bretta Bang III M.D.   On: 12/05/2017 13:52   Dg Chest Portable 1 View  Result Date: 11/17/2017 CLINICAL DATA:  Syncope EXAM: PORTABLE CHEST 1 VIEW COMPARISON:  10/26/2017 radiographs FINDINGS: Cardiomegaly and LEFT-sided pacemaker/ICD again noted. Mild pulmonary vascular congestion noted. There is no evidence of focal airspace disease, pulmonary edema, suspicious pulmonary nodule/mass, pleural effusion, or pneumothorax. No acute bony abnormalities are identified. IMPRESSION: Cardiomegaly with mild pulmonary vascular congestion. Electronically Signed   By: Harmon Pier M.D.   On: 11/17/2017 12:45    Time Spent in minutes  25   Ridhima Golberg M.D on 12/16/2017 at 11:32 AM  Between 7am to 7pm - Pager - [phone]  After 7pm go to [email or web address] - password  [facility]  [facility] -  Office  [phone]

## 2017-12-17 LAB — CBC
HEMATOCRIT: 46.4 % — AB (ref 36.0–46.0)
HEMOGLOBIN: 15 g/dL (ref 12.0–15.0)
MCH: 29.4 pg (ref 26.0–34.0)
MCHC: 32.3 g/dL (ref 30.0–36.0)
MCV: 91 fL (ref 78.0–100.0)
Platelets: 196 10*3/uL (ref 150–400)
RBC: 5.1 MIL/uL (ref 3.87–5.11)
RDW: 17.2 % — ABNORMAL HIGH (ref 11.5–15.5)
WBC: 6.4 10*3/uL (ref 4.0–10.5)

## 2017-12-17 LAB — BASIC METABOLIC PANEL
ANION GAP: 13 (ref 5–15)
BUN: 20 mg/dL (ref 6–20)
CHLORIDE: 89 mmol/L — AB (ref 101–111)
CO2: 29 mmol/L (ref 22–32)
CREATININE: 0.82 mg/dL (ref 0.44–1.00)
Calcium: 9 mg/dL (ref 8.9–10.3)
GFR calc non Af Amer: >60 mL/min (ref >60)
Glucose, Bld: 127 mg/dL — ABNORMAL HIGH (ref 65–99)
POTASSIUM: 3.6 mmol/L (ref 3.5–5.1)
SODIUM: 131 mmol/L — AB (ref 135–145)

## 2017-12-17 LAB — GLUCOSE, CAPILLARY
GLUCOSE-CAPILLARY: 117 mg/dL — AB (ref 65–99)
GLUCOSE-CAPILLARY: 172 mg/dL — AB (ref 65–99)
GLUCOSE-CAPILLARY: 166 mg/dL — AB (ref 65–99)
Glucose-Capillary: 179 mg/dL — ABNORMAL HIGH (ref 65–99)

## 2017-12-17 LAB — MAGNESIUM: MAGNESIUM: 1.8 mg/dL (ref 1.7–2.4)

## 2017-12-17 MED ORDER — NEOMYCIN-POLYMYXIN-PRAMOXINE 1 % EX CREA
TOPICAL_CREAM | Freq: Two times a day (BID) | CUTANEOUS | Status: DC
  Filled 2017-12-17: qty 28

## 2017-12-17 MED ORDER — POTASSIUM CHLORIDE CRYS ER 20 MEQ PO TBCR
40.0000 meq | EXTENDED_RELEASE_TABLET | Freq: Once | ORAL | Status: AC
  Administered 2017-12-17: 40 meq via ORAL
  Filled 2017-12-17: qty 2

## 2017-12-17 MED ORDER — MAGNESIUM SULFATE 2 GM/50ML IV SOLN
2.0000 g | Freq: Once | INTRAVENOUS | Status: AC
  Administered 2017-12-17: 2 g via INTRAVENOUS
  Filled 2017-12-17: qty 50

## 2017-12-17 NOTE — Progress Notes (Signed)
PROGRESS NOTE                                                                                                                                                                                                             Patient Demographics:    Donna Graves, is a 67 y.o. female, DOB - 08/17/1951, FMB:[id number]  Admit date - 12/05/2017   Admitting Physician Haydee Monica, MD  Outpatient Primary MD for the patient is Wilson Singer, MD  LOS - 12  Outpatient Specialists: Cardiology  Chief Complaint  Patient presents with  . Leg Swelling       Brief Narrative   67 year old morbidly obese female with nonischemic cardiomyopathy (EF of 25%), status post AICD, diabetes mellitus and A. fib admitted with acute on chronic systolic CHF with anasarca.   Subjective:   Urine output not charted well (no record from yesterday morning).  Had a near syncopal episode yesterday.  EKG unremarkable (did a notice short run of NSVT on the monitor).  Reports feeling tired.   Assessment  & Plan :    Principal Problem:   Acute on chronic systolic congestive heart failure (HCC) Baseline with of 315 pounds and is still most 20 pounds over.  Diuresed quite well in the past 24 hours (4.3 L) now with a negative balance of 23.5 L. Continue IV Lasix 80 mg 3 times daily with plan on -2-3 L daily.  Cardiology consult appreciated and recommend to add low-dose metolazone if not diuresing adequately.  Added metolazone 2.5 mg daily since yesterday with good diuresis.  Continue strict I/O monitoring. Continue Coreg, lisinopril and Aldactone.  (Coreg and Aldactone dose reduced due to soft blood pressure.).      Active Problems: Hypervolemic hyponatremia. Improving with aggressive diuresis.  Type 2 diabetes mellitus Last A1c of 7.8.  Continue sliding scale coverage.  CBG stable.  Paroxysmal atrial tachycardia Continue aspirin and beta-blocker.   Follows with Dr. Ladona Ridgel.  Normal interrogation in December 2018.  Hypotension Episodic drop of systolic blood pressure to the 80s and 90s.  Currently stable and asymptomatic.  Morbid obesity BMI of 58.  Being followed by [facility] with plan on bariatric surgery.  Hypokalemia/ NSVT Replenished.  Monitor daily.  Normal magnesium.  ?  Syncopal episode on 3/29/prolonged QT EKG unremarkable except for markedly prolonged  QTC (550).  Avoid QT prolonging agents.  Keep K>4 and MG>2  Code Status : Full code  Family Communication  : None at bedside today.  Husband involved in care.  Disposition Plan  : Home once diuresed adequately.  Patient will be here for several days for adequate diuresis.  Barriers For Discharge : Active symptoms Consults  : Cardiology  Procedures  : None  DVT Prophylaxis  :  Lovenox -   Lab Results  Component Value Date   PLT 196 12/17/2017    Antibiotics  :   Anti-infectives (From admission, onward)   None        Objective:   Vitals:   12/16/17 1500 12/17/17 0434 12/17/17 0500 12/17/17 0936  BP: 109/68 (!) 94/55  (!) 114/51  Pulse: 86 77  80  Resp: 18 15    Temp: 98.3 F (36.8 C) 98.4 F (36.9 C)    TempSrc: Oral     SpO2: 98% 94%    Weight:   (!) 152.4 kg (335 lb 14.4 oz)   Height:        Wt Readings from Last 3 Encounters:  12/17/17 (!) 152.4 kg (335 lb 14.4 oz)  11/17/17 (!) 149.7 kg (330 lb)  11/13/17 (!) 146.3 kg (322 lb 9.6 oz)     Intake/Output Summary (Last 24 hours) at 12/17/2017 1128 Last data filed at 12/17/2017 1117 Gross per 24 hour  Intake 843 ml  Output 4600 ml  Net -3757 ml     Physical Exam General: Morbidly obese female not in distress HEENT: Moist mucosa, supple neck Chest: Clear to auscultation bilaterally CVS: Normal S1 and S2, no murmurs GI: Diffuse pitting abdominal wall edema (improved compared to past few days), nontender and nondistended Musculoskeletal: Warm, 2+ pitting edema bilaterally  (improving)    Data Review:    CBC Recent Labs  Lab 12/11/17 0646 12/14/17 0538 12/17/17 0638  WBC 5.4 5.8 6.4  HGB 15.2* 14.9 15.0  HCT 46.8* 47.4* 46.4*  PLT 155 176 196  MCV 91.8 92.4 91.0  MCH 29.8 29.0 29.4  MCHC 32.5 31.4 32.3  RDW 16.9* 17.2* 17.2*    Chemistries  Recent Labs  Lab 12/13/17 0623 12/14/17 0538 12/15/17 0549 12/15/17 1546 12/16/17 0707 12/17/17 0638  NA 132* 131* 131*  --  132* 131*  K 3.0* 3.6 4.3  --  3.9 3.6  CL 87* 86* 85*  --  90* 89*  CO2 30 32 30  --  29 29  GLUCOSE 119* 133* 118*  --  119* 127*  BUN 15 16 20   --  20 20  CREATININE 0.78 0.80 0.77  --  0.79 0.82  CALCIUM 8.8* 8.6* 8.9  --  8.9 9.0  MG 1.8  --   --  2.0  --  1.8   ------------------------------------------------------------------------------------------------------------------ No results for input(s): CHOL, HDL, LDLCALC, TRIG, CHOLHDL, LDLDIRECT in the last 72 hours.  Lab Results  Component Value Date   HGBA1C 7.8 (H) 08/18/2017   ------------------------------------------------------------------------------------------------------------------ No results for input(s): TSH, T4TOTAL, T3FREE, THYROIDAB in the last 72 hours.  Invalid input(s): FREET3 ------------------------------------------------------------------------------------------------------------------ No results for input(s): VITAMINB12, FOLATE, FERRITIN, TIBC, IRON, RETICCTPCT in the last 72 hours.  Coagulation profile No results for input(s): INR, PROTIME in the last 168 hours.  No results for input(s): DDIMER in the last 72 hours.  Cardiac Enzymes No results for input(s): CKMB, TROPONINI, MYOGLOBIN in the last 168 hours.  Invalid input(s): CK ------------------------------------------------------------------------------------------------------------------    Component Value Date/Time  BNP 400.0 (H) 12/05/2017 1422    Inpatient Medications  Scheduled Meds: . aspirin EC  81 mg Oral Daily  .  carvedilol  3.125 mg Oral BID WC  . docusate sodium  100 mg Oral BID  . enoxaparin (LOVENOX) injection  75 mg Subcutaneous Q24H  . furosemide  80 mg Intravenous TID  . insulin aspart  0-9 Units Subcutaneous TID WC  . lisinopril  2.5 mg Oral Daily  . metolazone  2.5 mg Oral Daily  . neomycin-bacitracin-polymyxin   Topical BID  . pantoprazole  40 mg Oral Q0600  . potassium chloride  40 mEq Oral QID  . potassium chloride  40 mEq Oral Once  . sodium chloride flush  3 mL Intravenous Q12H  . spironolactone  12.5 mg Oral Daily   Continuous Infusions: . sodium chloride     PRN Meds:.sodium chloride, acetaminophen, alum & mag hydroxide-simeth, bisacodyl, polyethylene glycol, sodium chloride flush, sodium phosphate  Micro Results No results found for this or any previous visit (from the past 240 hour(s)).  Radiology Reports Dg Chest 2 View  Result Date: 12/05/2017 CLINICAL DATA:  Shortness of Breath EXAM: CHEST - 2 VIEW COMPARISON:  November 17, 2017 FINDINGS: There is no edema or consolidation. There is cardiomegaly with pulmonary venous hypertension. Pacemaker leads are attached to the right atrium, right ventricle, and coronary sinus. No adenopathy. No bone lesions. IMPRESSION: Pulmonary vascular congestion without edema or consolidation. Pacemaker leads as noted. Electronically Signed   By: Bretta Bang III M.D.   On: 12/05/2017 13:52   Dg Chest Portable 1 View  Result Date: 11/17/2017 CLINICAL DATA:  Syncope EXAM: PORTABLE CHEST 1 VIEW COMPARISON:  10/26/2017 radiographs FINDINGS: Cardiomegaly and LEFT-sided pacemaker/ICD again noted. Mild pulmonary vascular congestion noted. There is no evidence of focal airspace disease, pulmonary edema, suspicious pulmonary nodule/mass, pleural effusion, or pneumothorax. No acute bony abnormalities are identified. IMPRESSION: Cardiomegaly with mild pulmonary vascular congestion. Electronically Signed   By: Harmon Pier M.D.   On: 11/17/2017 12:45    Time  Spent in minutes  25   Leeya Rusconi M.D on 12/17/2017 at 11:28 AM  Between 7am to 7pm - Pager - [phone]  After 7pm go to [email or web address] - password [facility]  [facility] -  Office  [phone]

## 2017-12-18 LAB — BASIC METABOLIC PANEL
ANION GAP: 15 (ref 5–15)
BUN: 20 mg/dL (ref 6–20)
CHLORIDE: 88 mmol/L — AB (ref 101–111)
CO2: 28 mmol/L (ref 22–32)
Calcium: 9.4 mg/dL (ref 8.9–10.3)
Creatinine, Ser: 0.69 mg/dL (ref 0.44–1.00)
GFR calc non Af Amer: >60 mL/min (ref >60)
Glucose, Bld: 122 mg/dL — ABNORMAL HIGH (ref 65–99)
POTASSIUM: 3.7 mmol/L (ref 3.5–5.1)
SODIUM: 131 mmol/L — AB (ref 135–145)

## 2017-12-18 LAB — GLUCOSE, CAPILLARY
GLUCOSE-CAPILLARY: 108 mg/dL — AB (ref 65–99)
GLUCOSE-CAPILLARY: 193 mg/dL — AB (ref 65–99)
GLUCOSE-CAPILLARY: 130 mg/dL — AB (ref 65–99)
Glucose-Capillary: 163 mg/dL — ABNORMAL HIGH (ref 65–99)

## 2017-12-18 MED ORDER — METOLAZONE 5 MG PO TABS
2.5000 mg | ORAL_TABLET | Freq: Every day | ORAL | Status: DC
  Administered 2017-12-18 – 2017-12-25 (×8): 2.5 mg via ORAL
  Filled 2017-12-18 (×8): qty 1

## 2017-12-18 NOTE — Progress Notes (Signed)
Progress Note  Patient Name: Donna Graves Date of Encounter: 12/18/2017  Primary Cardiologist: Dr. Dina Rich  Subjective   Feels somewhat better.  Still weak.  No chest pain or shortness of breath at rest.  Inpatient Medications    Scheduled Meds: . aspirin EC  81 mg Oral Daily  . carvedilol  3.125 mg Oral BID WC  . docusate sodium  100 mg Oral BID  . enoxaparin (LOVENOX) injection  75 mg Subcutaneous Q24H  . furosemide  80 mg Intravenous TID  . insulin aspart  0-9 Units Subcutaneous TID WC  . lisinopril  2.5 mg Oral Daily  . neomycin-bacitracin-polymyxin   Topical BID  . pantoprazole  40 mg Oral Q0600  . potassium chloride  40 mEq Oral QID  . sodium chloride flush  3 mL Intravenous Q12H  . spironolactone  12.5 mg Oral Daily   Continuous Infusions: . sodium chloride     PRN Meds: sodium chloride, acetaminophen, alum & mag hydroxide-simeth, bisacodyl, polyethylene glycol, sodium chloride flush, sodium phosphate   Vital Signs    Vitals:   12/17/17 1500 12/17/17 2122 12/18/17 0500 12/18/17 0639  BP: 112/69 115/75  118/79  Pulse: 68 70  70  Resp: 18 18  18   Temp: 97.7 F (36.5 C) 98 F (36.7 C)  98 F (36.7 C)  TempSrc: Oral Oral  Oral  SpO2: 96% 98%  95%  Weight:   (!) 334 lb 8 oz (151.7 kg)   Height:        Intake/Output Summary (Last 24 hours) at 12/18/2017 0946 Last data filed at 12/18/2017 0600 Gross per 24 hour  Intake 1440 ml  Output 4800 ml  Net -3360 ml   Filed Weights   12/16/17 0621 12/17/17 0500 12/18/17 0500  Weight: (!) 334 lb (151.5 kg) (!) 335 lb 14.4 oz (152.4 kg) (!) 334 lb 8 oz (151.7 kg)    Telemetry    Paced rhythm.  Personally reviewed.  ECG    Tracing from 12/15/2017 showed dual-chamber pacing.  Personally reviewed.  Physical Exam   GEN:  Morbidly obese.  No acute distress.   Neck:  Increased girth, difficult to assess JVP. Cardiac:  Distant RRR, no obvious gallop. Respiratory: Nonlabored. Clear to auscultation  bilaterally. GI:  Obese with pannus, bowel sounds present. MS: 2+ leg edema; No deformity. Neuro:  Nonfocal. Psych: Alert and oriented x 3. Normal affect.  Labs    Chemistry Recent Labs  Lab 12/16/17 0707 12/17/17 0638 12/18/17 0538  NA 132* 131* 131*  K 3.9 3.6 3.7  CL 90* 89* 88*  CO2 29 29 28   GLUCOSE 119* 127* 122*  BUN 20 20 20   CREATININE 0.79 0.82 0.69  CALCIUM 8.9 9.0 9.4  GFRNONAA >60 >60 >60  GFRAA >60 >60 >60  ANIONGAP 13 13 15      Hematology Recent Labs  Lab 12/14/17 0538 12/17/17 0638  WBC 5.8 6.4  RBC 5.13* 5.10  HGB 14.9 15.0  HCT 47.4* 46.4*  MCV 92.4 91.0  MCH 29.0 29.4  MCHC 31.4 32.3  RDW 17.2* 17.2*  PLT 176 196    Radiology    No results found.  Cardiac Studies   Echocardiogram 10/17/2017: Study Conclusions  - Left ventricle: The estimated ejection fraction was 25%. - Ventricular septum: Septal motion showed abnormal function and   dyssynergy. - Mitral valve: Mildly calcified annulus. - Right ventricle: The cavity size was mildly dilated. Systolic   function was moderately reduced.  Impressions:  -  Limited study with Definity. Images are still quite limited. LVEF   estimated at approximately 25% based on the most consistent   views. Cannot specifically comment on focal wall motion although   there does look to be septal dyssynergy. There is also moderate   right ventricular dysfunction.  Patient Profile     67 y.o. female with history of paroxysmal atrial tachycardia, type 2 diabetes mellitus, morbid obesity, nonischemic cardiomyopathy with LVEF approximately 25%, ICD in place, and history of nonobstructive CAD as of 2014.  Currently being managed for acute on chronic combined heart failure.  Assessment & Plan    1.  Acute on chronic combined heart failure.  LVEF approximately 25% by most recent echocardiogram.  Reportedly baseline weight closer to 312-316 range per Dr. Verna Czech last note.  She has been on Lasix 80 mg IV  3 times daily.  Also received low-dose metolazone over the weekend.  2.  Nonischemic cardiomyopathy with LVEF approximately 25%.  3.  Suspected OSA.  4.  [employer]onic ICD in place.  Dual-chamber device.  Patient has diuresed 3.3-3.4 L/day over the weekend.  Renal function has been stable.  Recommend continuing current Lasix dose and place on standing metolazone again since this was effective.  Lasix infusion could be considered, although if adequate diuresis continues we will continue with current divided doses.  Signed, Nona Dell, MD  12/18/2017, 9:46 AM

## 2017-12-18 NOTE — Progress Notes (Signed)
PROGRESS NOTE                                                                                                                                                                                                             Patient Demographics:    Donna Graves, is a 67 y.o. female, DOB - 1951/07/11, ZOX:[id number]  Admit date - 12/05/2017   Admitting Physician Haydee Monica, MD  Outpatient Primary MD for the patient is Wilson Singer, MD  LOS - 13  Outpatient Specialists: Cardiology  Chief Complaint  Patient presents with  . Leg Swelling       Brief Narrative   67 year old morbidly obese female with nonischemic cardiomyopathy (EF of 25%), status post AICD, diabetes mellitus and A. fib admitted with acute on chronic systolic CHF with anasarca.   Subjective:   Had excellent urine output past 24 hours.  Continues to feel better.   Assessment  & Plan :    Principal Problem:   Acute on chronic systolic congestive heart failure (HCC) Baseline with of 315 pounds and is still most 20 pounds over.  (I think the weight is not charted accurately as it has not changed in the last 2 days despite diuresing almost 8 L) Continue IV Lasix 80 mg 3 times daily, cardiology recommends to monitor on scheduled metolazone given excellent diuresis. Continue Coreg, lisinopril and Aldactone at current doses. She will stay in the hospital rest of the week until diuresed well.      Active Problems: Hypervolemic hyponatremia. Improved with aggressive diuresis.  Type 2 diabetes mellitus Last A1c of 7.8.  Continue sliding scale coverage.  CBG stable.  Paroxysmal atrial tachycardia Continue aspirin and beta-blocker.  Follows with Dr. Ladona Ridgel.  Normal interrogation in December 2018.  Hypotension Episodic drop of systolic blood pressure to the 80s and 90s.  Stable for past few days.  Morbid obesity BMI of 58.  Being followed by [facility]  weight loss clinic with plan on bariatric surgery.  Hypokalemia/ NSVT Replenished.  Monitor daily.  Normal magnesium.  ?  Syncopal episode on 3/29/prolonged QT EKG unremarkable except for markedly prolonged QTC (550).  Avoid QT prolonging agents.  Keep K>4 and MG>2  Code Status : Full code  Family Communication  : None at bedside today.  Husband involved in care.  Disposition Plan  :  Home once diuresed adequately.  Possibly end of the week  Barriers For Discharge : Active symptoms Consults  : Cardiology  Procedures  : None  DVT Prophylaxis  :  Lovenox -   Lab Results  Component Value Date   PLT 196 12/17/2017    Antibiotics  :   Anti-infectives (From admission, onward)   None        Objective:   Vitals:   12/17/17 1500 12/17/17 2122 12/18/17 0500 12/18/17 0639  BP: 112/69 115/75  118/79  Pulse: 68 70  70  Resp: 18 18  18   Temp: 97.7 F (36.5 C) 98 F (36.7 C)  98 F (36.7 C)  TempSrc: Oral Oral  Oral  SpO2: 96% 98%  95%  Weight:   (!) 151.7 kg (334 lb 8 oz)   Height:        Wt Readings from Last 3 Encounters:  12/18/17 (!) 151.7 kg (334 lb 8 oz)  11/17/17 (!) 149.7 kg (330 lb)  11/13/17 (!) 146.3 kg (322 lb 9.6 oz)     Intake/Output Summary (Last 24 hours) at 12/18/2017 1028 Last data filed at 12/18/2017 1000 Gross per 24 hour  Intake 1440 ml  Output 4800 ml  Net -3360 ml     Physical Exam General: Morbidly obese female not in distress, fatigued HEENT: Moist mucosa, supple neck Chest: Clear bilaterally CVS: Normal S1 and S2, no murmurs GI: Diffuse pitting abdominal wall edema (improving on daily exam), nontender Musculoskeletal: 2+ pitting edema bilaterally (improving)     Data Review:    CBC Recent Labs  Lab 12/14/17 0538 12/17/17 0638  WBC 5.8 6.4  HGB 14.9 15.0  HCT 47.4* 46.4*  PLT 176 196  MCV 92.4 91.0  MCH 29.0 29.4  MCHC 31.4 32.3  RDW 17.2* 17.2*    Chemistries  Recent Labs  Lab 12/13/17 0623 12/14/17 0538  12/15/17 0549 12/15/17 1546 12/16/17 0707 12/17/17 0638 12/18/17 0538  NA 132* 131* 131*  --  132* 131* 131*  K 3.0* 3.6 4.3  --  3.9 3.6 3.7  CL 87* 86* 85*  --  90* 89* 88*  CO2 30 32 30  --  29 29 28   GLUCOSE 119* 133* 118*  --  119* 127* 122*  BUN 15 16 20   --  20 20 20   CREATININE 0.78 0.80 0.77  --  0.79 0.82 0.69  CALCIUM 8.8* 8.6* 8.9  --  8.9 9.0 9.4  MG 1.8  --   --  2.0  --  1.8  --    ------------------------------------------------------------------------------------------------------------------ No results for input(s): CHOL, HDL, LDLCALC, TRIG, CHOLHDL, LDLDIRECT in the last 72 hours.  Lab Results  Component Value Date   HGBA1C 7.8 (H) 08/18/2017   ------------------------------------------------------------------------------------------------------------------ No results for input(s): TSH, T4TOTAL, T3FREE, THYROIDAB in the last 72 hours.  Invalid input(s): FREET3 ------------------------------------------------------------------------------------------------------------------ No results for input(s): VITAMINB12, FOLATE, FERRITIN, TIBC, IRON, RETICCTPCT in the last 72 hours.  Coagulation profile No results for input(s): INR, PROTIME in the last 168 hours.  No results for input(s): DDIMER in the last 72 hours.  Cardiac Enzymes No results for input(s): CKMB, TROPONINI, MYOGLOBIN in the last 168 hours.  Invalid input(s): CK ------------------------------------------------------------------------------------------------------------------    Component Value Date/Time   BNP 400.0 (H) 12/05/2017 1422    Inpatient Medications  Scheduled Meds: . aspirin EC  81 mg Oral Daily  . carvedilol  3.125 mg Oral BID WC  . docusate sodium  100 mg Oral BID  .  enoxaparin (LOVENOX) injection  75 mg Subcutaneous Q24H  . furosemide  80 mg Intravenous TID  . insulin aspart  0-9 Units Subcutaneous TID WC  . lisinopril  2.5 mg Oral Daily  . metolazone  2.5 mg Oral Daily  .  neomycin-bacitracin-polymyxin   Topical BID  . pantoprazole  40 mg Oral Q0600  . potassium chloride  40 mEq Oral QID  . sodium chloride flush  3 mL Intravenous Q12H  . spironolactone  12.5 mg Oral Daily   Continuous Infusions: . sodium chloride     PRN Meds:.sodium chloride, acetaminophen, alum & mag hydroxide-simeth, bisacodyl, polyethylene glycol, sodium chloride flush, sodium phosphate  Micro Results No results found for this or any previous visit (from the past 240 hour(s)).  Radiology Reports Dg Chest 2 View  Result Date: 12/05/2017 CLINICAL DATA:  Shortness of Breath EXAM: CHEST - 2 VIEW COMPARISON:  November 17, 2017 FINDINGS: There is no edema or consolidation. There is cardiomegaly with pulmonary venous hypertension. Pacemaker leads are attached to the right atrium, right ventricle, and coronary sinus. No adenopathy. No bone lesions. IMPRESSION: Pulmonary vascular congestion without edema or consolidation. Pacemaker leads as noted. Electronically Signed   By: Bretta Bang III M.D.   On: 12/05/2017 13:52    Time Spent in minutes  25   Avary Eichenberger M.D on 12/18/2017 at 10:28 AM  Between 7am to 7pm - Pager - [phone]  After 7pm go to [email or web address] - password [facility]  [facility] -  Office  [phone]

## 2017-12-19 LAB — BASIC METABOLIC PANEL
Anion gap: 14 (ref 5–15)
BUN: 22 mg/dL — AB (ref 6–20)
CO2: 29 mmol/L (ref 22–32)
CREATININE: 0.87 mg/dL (ref 0.44–1.00)
Calcium: 9.1 mg/dL (ref 8.9–10.3)
Chloride: 89 mmol/L — ABNORMAL LOW (ref 101–111)
GFR calc Af Amer: >60 mL/min (ref >60)
GFR calc non Af Amer: >60 mL/min (ref >60)
Glucose, Bld: 139 mg/dL — ABNORMAL HIGH (ref 65–99)
POTASSIUM: 3.6 mmol/L (ref 3.5–5.1)
SODIUM: 132 mmol/L — AB (ref 135–145)

## 2017-12-19 LAB — GLUCOSE, CAPILLARY
GLUCOSE-CAPILLARY: 119 mg/dL — AB (ref 65–99)
Glucose-Capillary: 159 mg/dL — ABNORMAL HIGH (ref 65–99)
Glucose-Capillary: 161 mg/dL — ABNORMAL HIGH (ref 65–99)
Glucose-Capillary: 146 mg/dL — ABNORMAL HIGH (ref 65–99)

## 2017-12-19 MED ORDER — ONDANSETRON HCL 4 MG PO TABS
4.0000 mg | ORAL_TABLET | Freq: Three times a day (TID) | ORAL | Status: DC | PRN
  Administered 2017-12-19: 4 mg via ORAL
  Filled 2017-12-19: qty 1

## 2017-12-19 NOTE — Progress Notes (Signed)
Progress Note  Patient Name: Donna Graves Date of Encounter: 12/19/2017  Primary Cardiologist: Dr. Dina Rich  Subjective   Gradually feeling better, less abdominal distention and leg swelling.  No chest pain or shortness of breath at rest.  Inpatient Medications    Scheduled Meds: . aspirin EC  81 mg Oral Daily  . carvedilol  3.125 mg Oral BID WC  . docusate sodium  100 mg Oral BID  . enoxaparin (LOVENOX) injection  75 mg Subcutaneous Q24H  . furosemide  80 mg Intravenous TID  . insulin aspart  0-9 Units Subcutaneous TID WC  . lisinopril  2.5 mg Oral Daily  . metolazone  2.5 mg Oral Daily  . neomycin-bacitracin-polymyxin   Topical BID  . pantoprazole  40 mg Oral Q0600  . potassium chloride  40 mEq Oral QID  . sodium chloride flush  3 mL Intravenous Q12H  . spironolactone  12.5 mg Oral Daily   Continuous Infusions: . sodium chloride     PRN Meds: sodium chloride, acetaminophen, alum & mag hydroxide-simeth, bisacodyl, polyethylene glycol, sodium chloride flush, sodium phosphate   Vital Signs    Vitals:   12/18/17 1321 12/18/17 1500 12/18/17 2023 12/19/17 0636  BP:  115/85 (!) 123/58 113/72  Pulse:  65 82 75  Resp:  19 20   Temp:  98.1 F (36.7 C) 97.7 F (36.5 C) (!) 97.3 F (36.3 C)  TempSrc:  Oral Oral Oral  SpO2:  96% 97% 95%  Weight: (!) 336 lb 8 oz (152.6 kg)   (!) 333 lb 1.8 oz (151.1 kg)  Height:        Intake/Output Summary (Last 24 hours) at 12/19/2017 0911 Last data filed at 12/19/2017 0640 Gross per 24 hour  Intake 723 ml  Output 3700 ml  Net -2977 ml   Filed Weights   12/18/17 0500 12/18/17 1321 12/19/17 0636  Weight: (!) 334 lb 8 oz (151.7 kg) (!) 336 lb 8 oz (152.6 kg) (!) 333 lb 1.8 oz (151.1 kg)    Telemetry    Ventricular paced rhythm with intermittent PVCs.  Personally reviewed.  Physical Exam   GEN:  Morbidly obese woman.  No acute distress.   Neck:  Difficult to assess JVP. Cardiac:  Distant RRR, no gallop.    Respiratory: Nonlabored. Clear to auscultation bilaterally. GI:  Obese with pannus, bowel sounds present. MS:  2+ leg edema; No deformity. Neuro:  Nonfocal. Psych: Alert and oriented x 3. Normal affect.  Labs    Chemistry Recent Labs  Lab 12/17/17 [phone] 12/18/17 0538 12/19/17 0605  NA 131* 131* 132*  K 3.6 3.7 3.6  CL 89* 88* 89*  CO2 29 28 29   GLUCOSE 127* 122* 139*  BUN 20 20 22*  CREATININE 0.82 0.69 0.87  CALCIUM 9.0 9.4 9.1  GFRNONAA >60 >60 >60  GFRAA >60 >60 >60  ANIONGAP 13 15 14      Hematology Recent Labs  Lab 12/14/17 0538 12/17/17 0638  WBC 5.8 6.4  RBC 5.13* 5.10  HGB 14.9 15.0  HCT 47.4* 46.4*  MCV 92.4 91.0  MCH 29.0 29.4  MCHC 31.4 32.3  RDW 17.2* 17.2*  PLT 176 196    Radiology    No results found.  Cardiac Studies   Echocardiogram 10/17/2017: Study Conclusions  - Left ventricle: The estimated ejection fraction was 25%. - Ventricular septum: Septal motion showed abnormal function and dyssynergy. - Mitral valve: Mildly calcified annulus. - Right ventricle: The cavity size was mildly dilated.  Systolic function was moderately reduced.  Impressions:  - Limited study with Definity. Images are still quite limited. LVEF estimated at approximately 25% based on the most consistent views. Cannot specifically comment on focal wall motion although there does look to be septal dyssynergy. There is also moderate right ventricular dysfunction.  Patient Profile     67 y.o. female with history of paroxysmal atrial tachycardia, type 2 diabetes mellitus, morbid obesity, nonischemic cardiomyopathy with LVEF approximately 25%, ICD in place, and history of nonobstructive CAD as of 2014.  Currently being managed for acute on chronic combined heart failure.  Assessment & Plan    1.  Acute on chronic combined heart failure with LVEF 25%.  She continues to diurese quite well on high-dose IV Lasix and metolazone.  Renal function remained  stable.  She had another 3 L out more than in last 24 hours.  2.  Nonischemic cardiomyopathy, LVEF 25%.  3.  Suspected OSA.  4.  Med[employer]onic ICD in place.  Dual-chamber device.  No change in current diuretic regimen, Lasix 80 mg IV 3 times daily and metolazone 2.5 mg daily.  Follow up BMET in a.m.  Signed, Nona Dell, MD  12/19/2017, 9:11 AM

## 2017-12-19 NOTE — Progress Notes (Signed)
PT Cancellation Note  Patient Details Name: Donna Graves MRN: [id number] DOB: 1951-01-24   Cancelled Treatment:    Reason Eval/Treat Not Completed: Other (comment) Pt stating she is feeling nauseated since eating her lunch and is not up for therapy today. RN notified. Will check back at a later time/day.   Jac Canavan PT, DPT

## 2017-12-19 NOTE — Progress Notes (Signed)
PROGRESS NOTE                                                                                                                                                                                                             Patient Demographics:    Donna Graves, is a 67 y.o. female, DOB - 06-Oct-1950, IZT:[id number]  Admit date - 12/05/2017   Admitting Physician Haydee Monica, MD  Outpatient Primary MD for the patient is Wilson Singer, MD  LOS - 14  Outpatient Specialists: Cardiology  Chief Complaint  Patient presents with  . Leg Swelling       Brief Narrative   67 year old morbidly obese female with nonischemic cardiomyopathy (EF of 25%), status post AICD, diabetes mellitus and A. fib admitted with acute on chronic systolic CHF with anasarca.   Subjective:   Continues to have good diuresis.  Reports feeling weak today.   Assessment  & Plan :    Principal Problem:   Acute on chronic systolic congestive heart failure (HCC) Baseline with of 315 pounds and still showing 20 pounds over persistently 5-day despite over 10 L negative output (almost -30 L since admission). Continue IV Lasix 80 mg 3 times daily along with metolazone daily.  Cardiology consult appreciated. Continue Coreg, lisinopril and Aldactone at current doses. She will stay in the hospital rest of the week until diuresed well.  Recheck weight.    Active Problems: Hypervolemic hyponatremia. Improved with aggressive diuresis.  Type 2 diabetes mellitus Last A1c of 7.8.  Continue sliding scale coverage.  CBG stable.  Paroxysmal atrial tachycardia Continue aspirin and beta-blocker.  Follows with Dr. Ladona Ridgel.  Normal interrogation in December 2018.  Hypotension Episodic drop of systolic blood pressure to the 80s and 90s.  Stable for past few days.  Morbid obesity BMI of 58.  Being followed by [facility] weight loss clinic with plan on bariatric  surgery.  Hypokalemia/ NSVT Being replenished aggressively.  Monitor magnesium.  ?  Syncopal episode on 3/29/ prolonged QT EKG unremarkable except for markedly prolonged QTC (550).  Avoid QT prolonging agents.  Keep K>4 and MG>2  Code Status : Full code  Family Communication  : None at bedside.  Husband involved in care.  Disposition Plan  : Home once diuresed adequately.  Possibly end of the week  Barriers For Discharge : Active  symptoms Consults  : Cardiology  Procedures  : None  DVT Prophylaxis  :  Lovenox -   Lab Results  Component Value Date   PLT 196 12/17/2017    Antibiotics  :   Anti-infectives (From admission, onward)   None        Objective:   Vitals:   12/18/17 1500 12/18/17 2023 12/19/17 0636 12/19/17 0952  BP: 115/85 (!) 123/58 113/72   Pulse: 65 82 75   Resp: 19 20    Temp: 98.1 F (36.7 C) 97.7 F (36.5 C) (!) 97.3 F (36.3 C)   TempSrc: Oral Oral Oral   SpO2: 96% 97% 95%   Weight:   (!) 151.1 kg (333 lb 1.8 oz) (!) 151.7 kg (334 lb 7 oz)  Height:        Wt Readings from Last 3 Encounters:  12/19/17 (!) 151.7 kg (334 lb 7 oz)  11/17/17 (!) 149.7 kg (330 lb)  11/13/17 (!) 146.3 kg (322 lb 9.6 oz)     Intake/Output Summary (Last 24 hours) at 12/19/2017 1035 Last data filed at 12/19/2017 0640 Gross per 24 hour  Intake 483 ml  Output 3700 ml  Net -3217 ml     Physical Exam General: Morbidly obese female appears fatigued HEENT: Moist mucosa, supple neck Chest: Clear bilaterally CVS: Normal S1 and S2, no murmurs GI: Pitting abdominal wall edema (improving), nontender Musculoskeletal: 2+ pitting edema bilaterally (improving daily)     Data Review:    CBC Recent Labs  Lab 12/14/17 0538 12/17/17 0638  WBC 5.8 6.4  HGB 14.9 15.0  HCT 47.4* 46.4*  PLT 176 196  MCV 92.4 91.0  MCH 29.0 29.4  MCHC 31.4 32.3  RDW 17.2* 17.2*    Chemistries  Recent Labs  Lab 12/13/17 0623  12/15/17 0549 12/15/17 1546 12/16/17 0707  12/17/17 1610 12/18/17 0538 12/19/17 0605  NA 132*   < > 131*  --  132* 131* 131* 132*  K 3.0*   < > 4.3  --  3.9 3.6 3.7 3.6  CL 87*   < > 85*  --  90* 89* 88* 89*  CO2 30   < > 30  --  29 29 28 29   GLUCOSE 119*   < > 118*  --  119* 127* 122* 139*  BUN 15   < > 20  --  20 20 20  22*  CREATININE 0.78   < > 0.77  --  0.79 0.82 0.69 0.87  CALCIUM 8.8*   < > 8.9  --  8.9 9.0 9.4 9.1  MG 1.8  --   --  2.0  --  1.8  --   --    < > = values in this interval not displayed.   ------------------------------------------------------------------------------------------------------------------ No results for input(s): CHOL, HDL, LDLCALC, TRIG, CHOLHDL, LDLDIRECT in the last 72 hours.  Lab Results  Component Value Date   HGBA1C 7.8 (H) 08/18/2017   ------------------------------------------------------------------------------------------------------------------ No results for input(s): TSH, T4TOTAL, T3FREE, THYROIDAB in the last 72 hours.  Invalid input(s): FREET3 ------------------------------------------------------------------------------------------------------------------ No results for input(s): VITAMINB12, FOLATE, FERRITIN, TIBC, IRON, RETICCTPCT in the last 72 hours.  Coagulation profile No results for input(s): INR, PROTIME in the last 168 hours.  No results for input(s): DDIMER in the last 72 hours.  Cardiac Enzymes No results for input(s): CKMB, TROPONINI, MYOGLOBIN in the last 168 hours.  Invalid input(s): CK ------------------------------------------------------------------------------------------------------------------    Component Value Date/Time   BNP 400.0 (H) 12/05/2017 1422  Inpatient Medications  Scheduled Meds: . aspirin EC  81 mg Oral Daily  . carvedilol  3.125 mg Oral BID WC  . docusate sodium  100 mg Oral BID  . enoxaparin (LOVENOX) injection  75 mg Subcutaneous Q24H  . furosemide  80 mg Intravenous TID  . insulin aspart  0-9 Units Subcutaneous TID WC   . lisinopril  2.5 mg Oral Daily  . metolazone  2.5 mg Oral Daily  . neomycin-bacitracin-polymyxin   Topical BID  . pantoprazole  40 mg Oral Q0600  . potassium chloride  40 mEq Oral QID  . sodium chloride flush  3 mL Intravenous Q12H  . spironolactone  12.5 mg Oral Daily   Continuous Infusions: . sodium chloride     PRN Meds:.sodium chloride, acetaminophen, alum & mag hydroxide-simeth, bisacodyl, polyethylene glycol, sodium chloride flush, sodium phosphate  Micro Results No results found for this or any previous visit (from the past 240 hour(s)).  Radiology Reports Dg Chest 2 View  Result Date: 12/05/2017 CLINICAL DATA:  Shortness of Breath EXAM: CHEST - 2 VIEW COMPARISON:  November 17, 2017 FINDINGS: There is no edema or consolidation. There is cardiomegaly with pulmonary venous hypertension. Pacemaker leads are attached to the right atrium, right ventricle, and coronary sinus. No adenopathy. No bone lesions. IMPRESSION: Pulmonary vascular congestion without edema or consolidation. Pacemaker leads as noted. Electronically Signed   By: Bretta Bang III M.D.   On: 12/05/2017 13:52    Time Spent in minutes  25   Hien Perreira M.D on 12/19/2017 at 10:35 AM  Between 7am to 7pm - Pager - [phone]  After 7pm go to [email or web address] - password [facility]  [facility] -  Office  [phone]

## 2017-12-19 NOTE — Care Management Note (Addendum)
Case Management Note  Patient Details  Name: Donna Graves MRN: [id number] Date of Birth: Sep 11, 1951  If discussed at Long Length of Stay Meetings, dates discussed:  12/19/17  Additional Comments: CM visited with pt. Plan continues to be for return home with resumption of HH services. Pt's weight is unchanged over many days despite removal of large amounts of fluid. Pt snacking on bag of carrots during visit. Question weather or not pt's intake is accurate, she is on fluid restriction but is patient getting fluids from outside sources or is patient snacking on fruits/veges, she believes to be healthy but may have a high water content?  Malcolm Metro, RN 12/19/2017, 12:16 PM

## 2017-12-20 LAB — CBC
HEMATOCRIT: 49.1 % — AB (ref 36.0–46.0)
Hemoglobin: 15.8 g/dL — ABNORMAL HIGH (ref 12.0–15.0)
MCH: 29.5 pg (ref 26.0–34.0)
MCHC: 32.2 g/dL (ref 30.0–36.0)
MCV: 91.6 fL (ref 78.0–100.0)
PLATELETS: 198 10*3/uL (ref 150–400)
RBC: 5.36 MIL/uL — ABNORMAL HIGH (ref 3.87–5.11)
RDW: 17.3 % — AB (ref 11.5–15.5)
WBC: 5.2 10*3/uL (ref 4.0–10.5)

## 2017-12-20 LAB — BASIC METABOLIC PANEL
ANION GAP: 14 (ref 5–15)
BUN: 19 mg/dL (ref 6–20)
CALCIUM: 9.2 mg/dL (ref 8.9–10.3)
CO2: 30 mmol/L (ref 22–32)
Chloride: 88 mmol/L — ABNORMAL LOW (ref 101–111)
Creatinine, Ser: 0.9 mg/dL (ref 0.44–1.00)
Glucose, Bld: 139 mg/dL — ABNORMAL HIGH (ref 65–99)
POTASSIUM: 3.1 mmol/L — AB (ref 3.5–5.1)
Sodium: 132 mmol/L — ABNORMAL LOW (ref 135–145)

## 2017-12-20 LAB — MAGNESIUM: Magnesium: 1.8 mg/dL (ref 1.7–2.4)

## 2017-12-20 LAB — GLUCOSE, CAPILLARY
GLUCOSE-CAPILLARY: 115 mg/dL — AB (ref 65–99)
GLUCOSE-CAPILLARY: 174 mg/dL — AB (ref 65–99)
GLUCOSE-CAPILLARY: 179 mg/dL — AB (ref 65–99)
Glucose-Capillary: 162 mg/dL — ABNORMAL HIGH (ref 65–99)

## 2017-12-20 NOTE — Care Management Important Message (Signed)
Important Message  Patient Details  Name: Donna Graves MRN: [id number] Date of Birth: 1950/11/04   Medicare Important Message Given:  Yes    Renie Ora 12/20/2017, 11:29 AM

## 2017-12-20 NOTE — Progress Notes (Signed)
[facility] PROGRESS NOTE  Donna Graves ZOX:[id number] DOB: [birth year] DOA: 12/05/2017 PCP: Wilson Singer, MD   Interim summary and HPI:  67 y.o morbidly obese female with history of NICM (EF 25%), status post AICD, diabetes mellitus and A. Fib (currently not on anticoagulation, only aspirin 81mg ); who came  to the hospital on 12/05/17 with over a week of increased weight gain of over 27 pounds and complaining of SOB and swelling of legs/abdomen.   Patient has been diuresing well with an output of 3.8L in the past 24 hrs and 34.3 L since being admitted.   Assessment/Plan: Acute on chronic combined systolic and diastolic CHF -the patient has a reduced EF of 25% by most recent echo in 09/2017. -She has a total output of 34.3 L since admission, (3.8L in the past 24 hrs). Her weight is down from 342 lbs on admission to 328 lbs today. -Will continue aggressive diuresis with IV Lasix 80 mg TID and Metolazone 2.5 mg daily. -renal function has remained stable at 0.90 -Cardiology service on board and helping with comanagement of patient heart failure exacerbation; appreciate their assistance and will follow recommendations.  Hypokalemia  -Probably due to diuresis -Continue K-Dur repletion.  -Magnesium at 1.8 -follow BMET to assess electrolytes trend   Hypertension -BP has been well controlled at 116/55-126/82 within the past 24 hrs.  -Continue Coreg and Lisinopril -heart healthy diet ordered   Paroxysmal atrial tachycardia -Continue aspirin and beta blocker. -Follows with Dr. Ladona Ridgel  Nonischemic cardiomyopathy -AIDC placed on 05/2017 -Followed by Dr. Ladona Ridgel as an outpatient -no firing or arrhythmia appreciated -continue telemetry monitoring  Diabetes Mellitus type II -well controled currently (CBG's: between 85-174) -Last A1c at 7.8 -Continue holding oral hypoglycemic agents while inpatient -continue SSI  Open Right Chest wounds -Patient has two open wounds in right  chest (2cm and 1 cm), she states that it happened on the day of admission (3/19) when the telemetry leads were pulled off abruptly from her skin. The wounds have a purulent discharge and some surrounding erythema. Patient is afebrile and has no signs of systemic involvement. -Continue Neosporin ointment BID  Morbid Obesity -BMI at 56 -Patient is being followed by [facility] and is planning to undergo bariatric surgery in the near future.  -patient encourage to continue low calorie diet and increase exercise    DVT Prophylaxis: Lovenox Code Status: Full code Family Communication: Husband at bedside Disposition Plan: Keep inpatient, continue IV diuresis, patient still has a lot of fluid in abdominal area as well as bilateral lower limbs.    Consultants:  Cardiology  Procedures:  Chest X-ray IMPRESSION: Pulmonary vascular congestion without edema or consolidation. Pacemaker leads are attached to the right atrium, right ventricle, and coronary sinus. No adenopathy. No bone lesions.  Antibiotics:  Neosporin topical 3/27  HPI/Subjective: Patient sitting in chair at bedside.  Reports feeling better overall,  with less trouble breathing, even still not at baseline. She reports she had been ambulating around the room with assistance.   Objective: Vitals:   12/19/17 1959 12/20/17 0556  BP: (!) 119/57 126/82  Pulse: 71 72  Resp:    Temp: 98.3 F (36.8 C) 98.1 F (36.7 C)  SpO2: 96% 94%    Intake/Output Summary (Last 24 hours) at 12/20/2017 1420 Last data filed at 12/20/2017 1248 Gross per 24 hour  Intake 343 ml  Output 4200 ml  Net -3857 ml   Filed Weights   12/19/17 0636 12/19/17 8119 12/20/17 1478  Weight: (!) 151.1 kg (333 lb 1.8 oz) (!) 151.7 kg (334 lb 7 oz) (!) 148.9 kg (328 lb 4.2 oz)    Exam:   General:  Afebrile. Awake, alert and oriented x 3. Obese female in no acute distress. Still severe abdominal swelling and bilateral LE pitting edema. Still  SOB and reporting orthopnea, Sat O2 at 94% at room air.    Cardiovascular: RRR. S1 and S2 heard. No rubs, murmurs or gallops. No JVD.  Respiratory: Fine crackles at the bases. No use of accessory muscles. No wheezing or rhonchi. Not requiring Oxygen supplementation.  Abdomen: Anasarca, soft, distended abdomen (skin demonstrating orange peel like structure, severe swelling). No tenderness to palpation. Positive BS. Unable to assess organomegaly due to body habitus.   Musculoskeletal: 2+ pitting edema bilaterally. Distal pedal pulses are 2+ bilaterally. 2 Open wounds on right chest due to pulling of telemetry leads, with some purulent discharge and surrounding erythema. No cyanosis or clubbing  Psychiatry:  Mood and affect appropriate. No hallucinations or suicidal ideation.   Data Reviewed: Basic Metabolic Panel: Recent Labs  Lab 12/15/17 1546 12/16/17 0707 12/17/17 4825 12/18/17 0538 12/19/17 0605 12/20/17 0525  NA  --  132* 131* 131* 132* 132*  K  --  3.9 3.6 3.7 3.6 3.1*  CL  --  90* 89* 88* 89* 88*  CO2  --  29 29 28 29 30   GLUCOSE  --  119* 127* 122* 139* 139*  BUN  --  20 20 20  22* 19  CREATININE  --  0.79 0.82 0.69 0.87 0.90  CALCIUM  --  8.9 9.0 9.4 9.1 9.2  MG 2.0  --  1.8  --   --  1.8   CBC: Recent Labs  Lab 12/14/17 0538 12/17/17 0638 12/20/17 0525  WBC 5.8 6.4 5.2  HGB 14.9 15.0 15.8*  HCT 47.4* 46.4* 49.1*  MCV 92.4 91.0 91.6  PLT 176 196 198   BNP (last 3 results) Recent Labs    10/26/17 1611 11/17/17 1220 12/05/17 1422  BNP 395.0* 480.0* 400.0*    ProBNP (last 3 results) No results for input(s): PROBNP in the last 8760 hours.  CBG: Recent Labs  Lab 12/19/17 1135 12/19/17 1700 12/19/17 2121 12/20/17 0730 12/20/17 1124  GLUCAP 159* 161* 146* 115* 174*    Studies: No results found.  Scheduled Meds: . aspirin EC  81 mg Oral Daily  . carvedilol  3.125 mg Oral BID WC  . docusate sodium  100 mg Oral BID  . enoxaparin (LOVENOX)  injection  75 mg Subcutaneous Q24H  . furosemide  80 mg Intravenous TID  . insulin aspart  0-9 Units Subcutaneous TID WC  . lisinopril  2.5 mg Oral Daily  . metolazone  2.5 mg Oral Daily  . neomycin-bacitracin-polymyxin   Topical BID  . pantoprazole  40 mg Oral Q0600  . potassium chloride  40 mEq Oral QID  . sodium chloride flush  3 mL Intravenous Q12H  . spironolactone  12.5 mg Oral Daily   Continuous Infusions: . sodium chloride       Time spent: 30 minutes    Vassie Loll  [facility] Pager [phone]. If 7PM-7AM, please contact night-coverage at [email or web address], password [facility] 12/20/2017, 2:20 PM  LOS: 15 days

## 2017-12-20 NOTE — Progress Notes (Addendum)
Progress Note  Patient Name: Donna Graves Date of Encounter: 12/20/2017  Primary Cardiologist: Dina Rich, MD   Subjective   Breathing improved but not yet at baseline. Has been ambulating around the room. Denies any chest pain or palpitations. Experienced nausea yesterday afternoon, now resolved.   Inpatient Medications    Scheduled Meds: . aspirin EC  81 mg Oral Daily  . carvedilol  3.125 mg Oral BID WC  . docusate sodium  100 mg Oral BID  . enoxaparin (LOVENOX) injection  75 mg Subcutaneous Q24H  . furosemide  80 mg Intravenous TID  . insulin aspart  0-9 Units Subcutaneous TID WC  . lisinopril  2.5 mg Oral Daily  . metolazone  2.5 mg Oral Daily  . neomycin-bacitracin-polymyxin   Topical BID  . pantoprazole  40 mg Oral Q0600  . potassium chloride  40 mEq Oral QID  . sodium chloride flush  3 mL Intravenous Q12H  . spironolactone  12.5 mg Oral Daily   Continuous Infusions: . sodium chloride     PRN Meds: sodium chloride, acetaminophen, alum & mag hydroxide-simeth, bisacodyl, ondansetron, polyethylene glycol, sodium chloride flush, sodium phosphate   Vital Signs    Vitals:   12/19/17 0952 12/19/17 1357 12/19/17 1959 12/20/17 0556  BP:  (!) 116/55 (!) 119/57 126/82  Pulse:  71 71 72  Resp:  20    Temp:  98.2 F (36.8 C) 98.3 F (36.8 C) 98.1 F (36.7 C)  TempSrc:  Oral Oral Oral  SpO2:  95% 96% 94%  Weight: (!) 334 lb 7 oz (151.7 kg)   (!) 328 lb 4.2 oz (148.9 kg)  Height:        Intake/Output Summary (Last 24 hours) at 12/20/2017 0933 Last data filed at 12/20/2017 0500 Gross per 24 hour  Intake 393 ml  Output 4300 ml  Net -3907 ml   Filed Weights   12/19/17 0636 12/19/17 0952 12/20/17 0556  Weight: (!) 333 lb 1.8 oz (151.1 kg) (!) 334 lb 7 oz (151.7 kg) (!) 328 lb 4.2 oz (148.9 kg)    Telemetry    AV dual-paced rhythm, HR in 70's. - Personally Reviewed  ECG    No new tracings.   Physical Exam   General: Well developed, obese Caucasian  female appearing in no acute distress. Head: Normocephalic, atraumatic.  Neck: Supple without bruits, JVD unable to be assessed secondary to body habitus. Lungs:  Resp regular and unlabored, decreased breath sounds along bases bilaterally. Heart: RRR, S1, S2, no S3, S4, or murmur; no rub. Abdomen: Soft, with normoactive bowel sounds. No hepatomegaly. No rebound/guarding. No obvious abdominal masses. Appears distended.  Extremities: No clubbing, 2+ pitting edema bilaterally. Distal pedal pulses are 2+ bilaterally. Neuro: Alert and oriented X 3. Moves all extremities spontaneously. Psych: Normal affect.  Labs    Chemistry Recent Labs  Lab 12/18/17 0538 12/19/17 0605 12/20/17 0525  NA 131* 132* 132*  K 3.7 3.6 3.1*  CL 88* 89* 88*  CO2 28 29 30   GLUCOSE 122* 139* 139*  BUN 20 22* 19  CREATININE 0.69 0.87 0.90  CALCIUM 9.4 9.1 9.2  GFRNONAA >60 >60 >60  GFRAA >60 >60 >60  ANIONGAP 15 14 14      Hematology Recent Labs  Lab 12/14/17 0538 12/17/17 0638 12/20/17 0525  WBC 5.8 6.4 5.2  RBC 5.13* 5.10 5.36*  HGB 14.9 15.0 15.8*  HCT 47.4* 46.4* 49.1*  MCV 92.4 91.0 91.6  MCH 29.0 29.4 29.5  MCHC 31.4  32.3 32.2  RDW 17.2* 17.2* 17.3*  PLT 176 196 198    Radiology    No results found.  Cardiac Studies   Echocardiogram: 10/17/2017 Study Conclusions  - Left ventricle: The estimated ejection fraction was 25%. - Ventricular septum: Septal motion showed abnormal function and   dyssynergy. - Mitral valve: Mildly calcified annulus. - Right ventricle: The cavity size was mildly dilated. Systolic   function was moderately reduced.  Impressions:  - Limited study with Definity. Images are still quite limited. LVEF   estimated at approximately 25% based on the most consistent   views. Cannot specifically comment on focal wall motion although   there does look to be septal dyssynergy. There is also moderate   right ventricular dysfunction.  Patient Profile     67  y.o. female w/ PMH of chronic combined systolic and diastolic CHF (EF 40% by most recent ecjp), nonischemic cardiomyopathy (s/p BiV ICD placement in 05/2017 - unable to have LV lead placed), PAT, HTN, HLD, Type 2 DM, and morbid obesity who is currently admitted for a CHF exacerbation.   Assessment & Plan    1. Acute on Chronic Combined systolic and diastolic CHF - the patient has a known reduced EF of 25% by most recent echo in 09/2017. Presented with worsening dyspnea on exertion, edema, and abdominal distension in the setting of a weight gain > 20 lbs (baseline weight 312 - 316 lbs). She has been receiving IV Lasix 80mg  TID and Metolazone 2.5mg  daily. She is overall -33.4 L (net output of -3.8L yesterday) and weight is down from 342 lbs to admission to 328 lbs today. Creatinine stable at 0.90. K+ low at 3.1 (scheduled for replacement). Continue with current diuretic regimen.    2. Nonischemic cardiomyopathy - s/p dual chamber [employer] ICD placement in 05/2017 - unable to have LV lead placed at that time as outlined by prior notes. -  Followed by Dr. Ladona Ridgel as an outpatient.   3. HTN - BP has been well-controlled at 116/55 - 126/82 within the past 24 hours - continue low-dose Coreg and Lisinopril.   4. Morbid Obesity - BMI greater than 58. Is being followed by [facility] and is planning to undergo bariatric surgery in the near future.    For questions or updates, please contact [facility] Please consult [email or web address] for contact info under Cardiology/STEMI.   Lorri Frederick , PA-C 9:33 AM 12/20/2017 Pager: [phone]   Attending note:  Patient seen and examined.  Reviewed interval hospital course and discussed the case with Ms. Iran Ouch PA-C.  Patient continues to diurese well on Lasix and metolazone, net urine output of 3800 cc last 24 hours and a total loss of approximately 33 L over her hospital stay.  Renal function remains stable and she is getting  closer to her dry weight with clinical improvement.  We are not making any dose adjustments today.   Jonelle Sidle, M.D., F.A.C.C.

## 2017-12-21 LAB — GLUCOSE, CAPILLARY
GLUCOSE-CAPILLARY: 292 mg/dL — AB (ref 65–99)
Glucose-Capillary: 117 mg/dL — ABNORMAL HIGH (ref 65–99)
Glucose-Capillary: 179 mg/dL — ABNORMAL HIGH (ref 65–99)
Glucose-Capillary: 148 mg/dL — ABNORMAL HIGH (ref 65–99)

## 2017-12-21 LAB — BASIC METABOLIC PANEL
Anion gap: 13 (ref 5–15)
BUN: 17 mg/dL (ref 6–20)
CALCIUM: 8.9 mg/dL (ref 8.9–10.3)
CO2: 30 mmol/L (ref 22–32)
CREATININE: 0.66 mg/dL (ref 0.44–1.00)
Chloride: 89 mmol/L — ABNORMAL LOW (ref 101–111)
Glucose, Bld: 120 mg/dL — ABNORMAL HIGH (ref 65–99)
Potassium: 3.6 mmol/L (ref 3.5–5.1)
SODIUM: 132 mmol/L — AB (ref 135–145)

## 2017-12-21 NOTE — Progress Notes (Signed)
[facility] PROGRESS NOTE  Donna Graves LTR:[id number] DOB: 20-Oct-1950 DOA: 12/05/2017 PCP: Wilson Singer, MD   Interim summary and HPI:  67 y.o morbidly obese female with history of NICM (EF 25%), status post AICD, diabetes mellitus and A. Fib (currently not on anticoagulation, only aspirin 81mg ); who came  to the hospital on 12/05/17 with over a week of increased weight gain of over 27 pounds and complaining of SOB and swelling of legs/abdomen.   Patient has been diuresing well with an output of 3.8L in the past 24 hrs and 34.3 L since being admitted.   Assessment/Plan: Acute on chronic combined systolic and diastolic CHF -patient has a reduced EF of 25% by most recent echo in 09/2017. -She has a total output of 36.3 L since admission, (4.6L in the past 24 hrs). Her weight is down from 342 lbs on admission to 325 lbs today. -baseline estimated between 312-316 lbs  -Continue aggressive IV diuresis; will continue IV lasix 80mg  TID and metolazone 2.5 mg daily.  -renal function has remained stable and so far tolerating diuresis  -appreciate cardiology service assistance and guidance; will follow recommendations.  Hypokalemia  -in the setting of diuresis -will follow electrolytes trend and continue repletion as needed   Hypertension -stable overall -continue coreg and lisinopril -continue heart healthy diet  -continue current dose of lasix  Paroxysmal atrial tachycardia -stable -continue ASA and B-blocker  -continue outpatient follow up with EP (Dr. Ladona Ridgel)  Nonischemic cardiomyopathy -AIDC placed on 05/2017 -Followed by Dr. Ladona Ridgel as an outpatient -no ICD firing or arrhythmia appreciated -will continue telemetry monitoring  Diabetes Mellitus type II -overall stable and well controlled -Last A1c at 7.8 -Continue holding oral hypoglycemic agents while inpatient -continue SSI  Open Right Chest wounds -Patient has two open wounds in right chest (2cm and 1 cm), she  states that it happened on the day of admission (3/19) when the telemetry leads were pulled off abruptly from her skin. The wounds have a purulent discharge and some surrounding erythema. Patient is afebrile and has no signs of systemic involvement. -Continue Neosporin ointment BID  Morbid Obesity -Body mass index is 55.84 kg/m. -Patient is being followed by [facility] and is planning to undergo bariatric surgery in the near future.  -patient encourage to continue low calorie diet and increase exercise    DVT Prophylaxis: Lovenox Code Status: Full code Family Communication: Husband at bedside Disposition Plan: Keep inpatient, continue IV diuresis, patient still has a lot of fluid in abdominal area as well as bilateral lower limbs.    Consultants:  Cardiology  Procedures:  Chest X-ray IMPRESSION: Pulmonary vascular congestion without edema or consolidation. Pacemaker leads are attached to the right atrium, right ventricle, and coronary sinus. No adenopathy. No bone lesions.  Antibiotics:  Neosporin topical 3/27  HPI/Subjective: Afebrile, with no chest pain, reporting improvement in her breathing.  Still with signs of fluid overload on exam.  She continued to have good urine output.  Objective: Vitals:   12/21/17 1346 12/21/17 1348  BP: (!) 90/56 (!) 90/56  Pulse: 78 68  Resp: 19 19  Temp: 98.1 F (36.7 C) 98.1 F (36.7 C)  SpO2: 96% 96%    Intake/Output Summary (Last 24 hours) at 12/21/2017 1719 Last data filed at 12/21/2017 1500 Gross per 24 hour  Intake 480 ml  Output 5100 ml  Net -4620 ml   Filed Weights   12/19/17 0952 12/20/17 0556 12/21/17 0456  Weight: (!) 151.7 kg (334  lb 7 oz) (!) 148.9 kg (328 lb 4.2 oz) (!) 147.6 kg (325 lb 4.8 oz)    Exam:   General: No fever, AAOX3, in no acute distress.  Denies chest pain and reported improvement in her breathing and overall swelling. She is still SOB on exertion and reporting some orthopnea.    Cardiovascular: RRR, S1 and S2 appreciated, no rubs, no gallops, no murmurs.  Respiratory: Fine crackles at the bases, no using accessory muscles, no wheezing or rhonchi.  Patient not requiring any oxygen supplementation.  Abdomen: Swollen and distended, positive bowel sounds, no tenderness, no rebound.  Musculoskeletal: 2+ pitting edema bilaterally, no cyanosis or clubbing.  Skin: Patient with 2 open wounds on the right chest demonstrating mild erythema and scant purulent discharge; patient describing some tenderness on palpation.   Psychiatry: Stable mood and appropriate affect.  No hallucinations or suicidal ideation.  Patient with good insight about current condition and reason for hospitalization.  Data Reviewed: Basic Metabolic Panel: Recent Labs  Lab 12/15/17 1546  12/17/17 9811 12/18/17 0538 12/19/17 0605 12/20/17 0525 12/21/17 0430  NA  --    < > 131* 131* 132* 132* 132*  K  --    < > 3.6 3.7 3.6 3.1* 3.6  CL  --    < > 89* 88* 89* 88* 89*  CO2  --    < > 29 28 29 30 30   GLUCOSE  --    < > 127* 122* 139* 139* 120*  BUN  --    < > 20 20 22* 19 17  CREATININE  --    < > 0.82 0.69 0.87 0.90 0.66  CALCIUM  --    < > 9.0 9.4 9.1 9.2 8.9  MG 2.0  --  1.8  --   --  1.8  --    < > = values in this interval not displayed.   CBC: Recent Labs  Lab 12/17/17 0638 12/20/17 0525  WBC 6.4 5.2  HGB 15.0 15.8*  HCT 46.4* 49.1*  MCV 91.0 91.6  PLT 196 198   BNP (last 3 results) Recent Labs    10/26/17 1611 11/17/17 1220 12/05/17 1422  BNP 395.0* 480.0* 400.0*   CBG: Recent Labs  Lab 12/20/17 1605 12/20/17 2034 12/21/17 0757 12/21/17 1245 12/21/17 1630  GLUCAP 179* 162* 117* 292* 179*    Studies: No results found.  Scheduled Meds: . aspirin EC  81 mg Oral Daily  . carvedilol  3.125 mg Oral BID WC  . docusate sodium  100 mg Oral BID  . enoxaparin (LOVENOX) injection  75 mg Subcutaneous Q24H  . furosemide  80 mg Intravenous TID  . insulin aspart  0-9  Units Subcutaneous TID WC  . lisinopril  2.5 mg Oral Daily  . metolazone  2.5 mg Oral Daily  . neomycin-bacitracin-polymyxin   Topical BID  . pantoprazole  40 mg Oral Q0600  . potassium chloride  40 mEq Oral QID  . sodium chloride flush  3 mL Intravenous Q12H  . spironolactone  12.5 mg Oral Daily   Continuous Infusions: . sodium chloride       Time spent: 30 minutes    Vassie Loll  [facility] Pager [phone]. If 7PM-7AM, please contact night-coverage at [email or web address], password [facility] 12/21/2017, 5:19 PM  LOS: 16 days

## 2017-12-21 NOTE — Progress Notes (Signed)
Physical Therapy Treatment Patient Details Name: Donna Graves MRN: [id number] DOB: Sep 17, 1951 Today's Date: 12/21/2017    History of Present Illness 67 y.o. female was admitted with acute CHF and 27# weight gain with reduction in K+, had K+ repleted and is receiving lasix for edema.  EF 25%, PMHx:  PAF, HTN, morbid obesity, CHF, cellulitis panniculus, nonischemic cardiomyopathy     PT Comments    Pt received at EOB, husband at bedside, and was agreeable to PT treatment. Pt able to ambulate 69ft this date with slowed, labored cadence but no evidence of unsteadiness or LOB. Pt wished to return to EOB and had pt perform seated LE strengthening exercises. Continue to recommend HHPT upon d/c due to deficits in general strength, endurance, and gait.     Follow Up Recommendations  Home health PT;Supervision for mobility/OOB     Equipment Recommendations  None recommended by PT    Recommendations for Other Services       Precautions / Restrictions Precautions Precautions: Fall Precaution Comments: d/t generalized weakness but this is improving Restrictions Weight Bearing Restrictions: No    Mobility  Bed Mobility               General bed mobility comments: received sitting EOB and returned to sitting EOB  Transfers Overall transfer level: Needs assistance Equipment used: Rolling walker (2 wheeled) Transfers: Sit to/from [employer] Sit to Stand: Supervision Stand pivot transfers: Supervision       General transfer comment: slightly labored movement  Ambulation/Gait Ambulation/Gait assistance: Supervision Ambulation Distance (Feet): 80 Feet Assistive device: Rolling walker (2 wheeled) Gait Pattern/deviations: Decreased step length - right;Decreased step length - left;Decreased stride length   Gait velocity interpretation: Below normal speed for age/gender General Gait Details: demonstrates slightly labored slow cadence without loss of  balance   Stairs            Wheelchair Mobility    Modified Rankin (Stroke Patients Only)       Balance Overall balance assessment: Needs assistance Sitting-balance support: Feet supported;No upper extremity supported Sitting balance-Leahy Scale: Good     Standing balance support: Bilateral upper extremity supported;During functional activity Standing balance-Leahy Scale: Fair Standing balance comment: fair/good with RW                            Cognition Arousal/Alertness: Awake/alert Behavior During Therapy: WFL for tasks assessed/performed Overall Cognitive Status: Within Functional Limits for tasks assessed                                        Exercises General Exercises - Lower Extremity Long Arc Quad: Seated;AROM;Strengthening;Both;10 reps Hip Flexion/Marching: Seated;AROM;Strengthening;Both;10 reps Toe Raises: Seated;AROM;Strengthening;Both;10 reps Heel Raises: Seated;AROM;Strengthening;Both;10 reps    General Comments        Pertinent Vitals/Pain Pain Assessment: No/denies pain    Home Living                      Prior Function            PT Goals (current goals can now be found in the care plan section) Acute Rehab PT Goals Patient Stated Goal: to walk safely PT Goal Formulation: With patient/family Time For Goal Achievement: 12/21/17 Potential to Achieve Goals: Good    Frequency    Min 3X/week      PT  Plan      Co-evaluation              AM-PAC PT "6 Clicks" Daily Activity  Outcome Measure  Difficulty turning over in bed (including adjusting bedclothes, sheets and blankets)?: None Difficulty moving from lying on back to sitting on the side of the bed? : A Little Difficulty sitting down on and standing up from a chair with arms (e.g., wheelchair, bedside commode, etc,.)?: None Help needed moving to and from a bed to chair (including a wheelchair)?: A Little Help needed walking in  hospital room?: A Little Help needed climbing 3-5 steps with a railing? : A Little 6 Click Score: 20    End of Session Equipment Utilized During Treatment: Gait belt Activity Tolerance: Patient tolerated treatment well Patient left: in bed;with call bell/phone within reach;with family/visitor present(seated at bedside) Nurse Communication: Mobility status PT Visit Diagnosis: Unsteadiness on feet (R26.81);Other abnormalities of gait and mobility (R26.89)     Time: 4098-1191 PT Time Calculation (min) (ACUTE ONLY): 10 min  Charges:  $Gait Training: 8-22 mins                    G Codes:          Jac Canavan PT, DPT

## 2017-12-21 NOTE — Progress Notes (Addendum)
Progress Note  Patient Name: Donna Graves Date of Encounter: 12/21/2017  Primary Cardiologist: Dina Rich, MD   Subjective   No chest pain or palpitations. Reports legs do not feel as tight. Has been elevating them when sitting in the recliner.   Inpatient Medications    Scheduled Meds: . aspirin EC  81 mg Oral Daily  . carvedilol  3.125 mg Oral BID WC  . docusate sodium  100 mg Oral BID  . enoxaparin (LOVENOX) injection  75 mg Subcutaneous Q24H  . furosemide  80 mg Intravenous TID  . insulin aspart  0-9 Units Subcutaneous TID WC  . lisinopril  2.5 mg Oral Daily  . metolazone  2.5 mg Oral Daily  . neomycin-bacitracin-polymyxin   Topical BID  . pantoprazole  40 mg Oral Q0600  . potassium chloride  40 mEq Oral QID  . sodium chloride flush  3 mL Intravenous Q12H  . spironolactone  12.5 mg Oral Daily   Continuous Infusions: . sodium chloride     PRN Meds: sodium chloride, acetaminophen, alum & mag hydroxide-simeth, bisacodyl, ondansetron, polyethylene glycol, sodium chloride flush, sodium phosphate   Vital Signs    Vitals:   12/20/17 2012 12/20/17 2106 12/21/17 0450 12/21/17 0456  BP:  110/70 127/75   Pulse:  75 69   Resp:  20    Temp:  98 F (36.7 C) (!) 97.5 F (36.4 C)   TempSrc:  Oral Oral   SpO2: 97% 97% (!) 89%   Weight:    (!) 325 lb 4.8 oz (147.6 kg)  Height:        Intake/Output Summary (Last 24 hours) at 12/21/2017 0827 Last data filed at 12/21/2017 0456 Gross per 24 hour  Intake 480 ml  Output 4750 ml  Net -4270 ml   Filed Weights   12/19/17 0952 12/20/17 0556 12/21/17 0456  Weight: (!) 334 lb 7 oz (151.7 kg) (!) 328 lb 4.2 oz (148.9 kg) (!) 325 lb 4.8 oz (147.6 kg)    Telemetry    AV paced, HR in 70's. - Personally Reviewed  ECG    No new tracings.   Physical Exam   General: Well developed, obese Caucasian female appearing in no acute distress. Head: Normocephalic, atraumatic.  Neck: Supple without bruits, JVD not  elevated. Lungs:  Resp regular and unlabored, decreased along bases but improved from previous examinations. Heart: RRR, S1, S2, no S3, S4, or murmur; no rub. Abdomen: Soft, non-tender, non-distended with normoactive bowel sounds. No hepatomegaly. No rebound/guarding. No obvious abdominal masses. Extremities: No clubbing, cyanosis, 1+ pitting edema bilaterally. Distal pedal pulses are 2+ bilaterally. Neuro: Alert and oriented X 3. Moves all extremities spontaneously. Psych: Normal affect.  Labs    Chemistry Recent Labs  Lab 12/19/17 0605 12/20/17 0525 12/21/17 0430  NA 132* 132* 132*  K 3.6 3.1* 3.6  CL 89* 88* 89*  CO2 29 30 30   GLUCOSE 139* 139* 120*  BUN 22* 19 17  CREATININE 0.87 0.90 0.66  CALCIUM 9.1 9.2 8.9  GFRNONAA >60 >60 >60  GFRAA >60 >60 >60  ANIONGAP 14 14 13      Hematology Recent Labs  Lab 12/17/17 0638 12/20/17 0525  WBC 6.4 5.2  RBC 5.10 5.36*  HGB 15.0 15.8*  HCT 46.4* 49.1*  MCV 91.0 91.6  MCH 29.4 29.5  MCHC 32.3 32.2  RDW 17.2* 17.3*  PLT 196 198    Cardiac EnzymesNo results for input(s): TROPONINI in the last 168 hours. No results for  input(s): TROPIPOC in the last 168 hours.   BNPNo results for input(s): BNP, PROBNP in the last 168 hours.   DDimer No results for input(s): DDIMER in the last 168 hours.   Radiology    No results found.  Cardiac Studies   Echocardiogram: 10/17/2017 Study Conclusions  - Left ventricle: The estimated ejection fraction was 25%. - Ventricular septum: Septal motion showed abnormal function and   dyssynergy. - Mitral valve: Mildly calcified annulus. - Right ventricle: The cavity size was mildly dilated. Systolic   function was moderately reduced.  Impressions:  - Limited study with Definity. Images are still quite limited. LVEF   estimated at approximately 25% based on the most consistent   views. Cannot specifically comment on focal wall motion although   there does look to be septal dyssynergy.  There is also moderate   right ventricular dysfunction.  Patient Profile     67 y.o. female w/ PMH of chronic combined systolic and diastolic CHF (EF 16% by most recent ecjp), nonischemic cardiomyopathy (s/p BiV ICD placement in 05/2017 - unable to have LV lead placed), PAT, HTN, HLD, Type 2 DM, and morbid obesity currently admitted for acute CHF exacerbation.   Assessment & Plan    1. Acute on Chronic Combined systolic and diastolic CHF - the patient has a known reduced EF of 25% by most recent echo in 09/2017. Presented with worsening dyspnea on exertion, edema, and abdominal distension in the setting of a weight gain > 20 lbs (baseline weight 312 - 316 lbs). She has been receiving IV Lasix 80mg  TID and Metolazone 2.5mg  daily. Overall -34.3 L (net output of -4.0L yesterday) and weight is down from 342 lbs to admission to 325 lbs today. Continue current diuretic regimen as kidney function remains stable and she is responding well to medical therapy.   2. Nonischemic cardiomyopathy -s/pdual chamber MedtronicICD placement in 05/2017. Followed by Dr. Ladona Ridgel as an outpatient.   3. HTN -BP has been well-controlled at 102/62 - 127/75 within the past 24 hours. Continue current medication regimen.  4. Morbid Obesity - BMI at 55. Being followed by [facility] and is planning to undergo bariatric surgery in the near future.   For questions or updates, please contact [facility] Please consult [email or web address] for contact info under Cardiology/STEMI.   Lorri Frederick , PA-C 8:27 AM 12/21/2017 Pager: [phone]   Attending note:  Patient seen and examined.  Discussed the case with Ms. Iran Ouch PA-C.  I agree with her above assessment.  Ms. Dady continues to diurese briskly on IV Lasix along with metolazone.  She is approximately 4 L net out more than in last 24 hours and her weight continues to drop, currently 325 pounds.  Aiming for at least an additional 10  pounds as a goal for now.  She is not developing renal dysfunction in the face of diuresis, creatinine 0.66.  No changes made to current regimen today.  Jonelle Sidle, M.D., F.A.C.C.

## 2017-12-22 LAB — GLUCOSE, CAPILLARY
GLUCOSE-CAPILLARY: 182 mg/dL — AB (ref 65–99)
GLUCOSE-CAPILLARY: 146 mg/dL — AB (ref 65–99)
Glucose-Capillary: 154 mg/dL — ABNORMAL HIGH (ref 65–99)
Glucose-Capillary: 188 mg/dL — ABNORMAL HIGH (ref 65–99)

## 2017-12-22 LAB — BASIC METABOLIC PANEL
Anion gap: 14 (ref 5–15)
BUN: 20 mg/dL (ref 6–20)
CO2: 29 mmol/L (ref 22–32)
Calcium: 8.8 mg/dL — ABNORMAL LOW (ref 8.9–10.3)
Chloride: 86 mmol/L — ABNORMAL LOW (ref 101–111)
Creatinine, Ser: 0.73 mg/dL (ref 0.44–1.00)
GFR calc non Af Amer: >60 mL/min (ref >60)
Glucose, Bld: 130 mg/dL — ABNORMAL HIGH (ref 65–99)
POTASSIUM: 3.5 mmol/L (ref 3.5–5.1)
SODIUM: 129 mmol/L — AB (ref 135–145)

## 2017-12-22 NOTE — Progress Notes (Signed)
Patient refused to wear TED hose tonight, states "they made me cramp all night last night and I couldn't sleep", patient agrees to wear them tomorrow. Will pass on to RN in morning.

## 2017-12-22 NOTE — Progress Notes (Signed)
[facility] PROGRESS NOTE  TAMSEN OTERI MBB:[id number] DOB: [birth year] DOA: 12/05/2017 PCP: Wilson Singer, MD   Interim summary and HPI:  67 y.o morbidly obese female with history of NICM (EF 25%), status post AICD, diabetes mellitus and A. Fib (currently not on anticoagulation, only aspirin 81mg ); who came  to the hospital on 12/05/17 with over a week of increased weight gain of over 27 pounds and complaining of SOB and swelling of legs/abdomen.   Patient has been diuresing well with an output of 2.8L in the past 24 hrs and 36.0 L since being admitted.   Assessment/Plan: Acute on chronic combined systolic and diastolic CHF -patient has a reduced EF of 25% by most recent echo in 09/2017. -She has a total output of 36.0 L since admission, (2.8L in the past 24 hrs). Her weight is down from 342 lbs on admission to 327 lbs today. -baseline estimated between 312-316 lbs  -Continue aggressive IV diuresis; will continue IV lasix 80mg  TID and metolazone 2.5 mg daily.  -renal function has remained stable and so far tolerating diuresis  -appreciate cardiology service assistance and guidance; will follow recommendations.  Hypokalemia  -in the setting of diuresis -K at 3.5 -will follow electrolytes trend and continue repletion as needed   Hypertension -overall stable  -will continue current dose of coreg and lisinopril -continue heart healthy diet  -follow same dose of lasix as recommended by cardiology   Paroxysmal atrial tachycardia -has remained stable -continue ASA and B-blocker  -continue outpatient follow up with EP (Dr. Ladona Ridgel)  Nonischemic cardiomyopathy -AICD placed on 05/2017 -Followed by Dr. Ladona Ridgel as an outpatient -no ICD firing or arrhythmia appreciated -will continue telemetry monitoring  Diabetes Mellitus type II -overall stable and well controlled -Last A1c at 7.8 -Continue holding oral hypoglycemic agents while inpatient -continue SSI  Open Right Chest  wounds -Patient has two open wounds in right chest (2cm and 1 cm), she states that it happened on the day of admission (3/19) when the telemetry leads were pulled off abruptly from her skin. The wounds have a purulent discharge and some surrounding erythema. Patient is afebrile and has no signs of systemic involvement. -Continue Neosporin ointment BID  Morbid Obesity -Body mass index is 56.28 kg/m. -Patient is being followed by [facility] and is planning to undergo bariatric surgery in the near future.  -patient encourage to continue low calorie diet and increase exercise    DVT Prophylaxis: Lovenox Code Status: Full code Family Communication: Husband at bedside Disposition Plan: Keep inpatient, continue IV diuresis, patient still has a lot of fluid in abdominal area as well as bilateral lower limbs.    Consultants:  Cardiology  Procedures:  Chest X-ray IMPRESSION: Pulmonary vascular congestion without edema or consolidation. Pacemaker leads are attached to the right atrium, right ventricle, and coronary sinus. No adenopathy. No bone lesions.  Antibiotics:  Neosporin topical 3/27  HPI/Subjective: Afebrile, reporting improvement in her breathing. Patient still has signs of fluid overload on physical exam. She continues to have good urine output.   Objective: Vitals:   12/22/17 0819 12/22/17 0840  BP:  128/75  Pulse:  72  Resp:  20  Temp:  98 F (36.7 C)  SpO2: 93% 96%    Intake/Output Summary (Last 24 hours) at 12/22/2017 1122 Last data filed at 12/22/2017 0855 Gross per 24 hour  Intake 240 ml  Output 3125 ml  Net -2885 ml   Filed Weights   12/20/17 0556 12/21/17 0456 12/22/17  0700  Weight: (!) 148.9 kg (328 lb 4.2 oz) (!) 147.6 kg (325 lb 4.8 oz) (!) 148.7 kg (327 lb 14.4 oz)    Exam:   General: Afebrile. Alert, awake, oriented x 3. In no acute distress. Patient denies chest pain and improvement in her breathing and overall swelling. She is  still SOB on exertion and reporting some orthopnea.   Cardiovascular: RRR. S1 and S2 heard. No rubs, gallops, or murmurs.  Respiratory: Fine crackles at the bases. No use of accessory muscles. No wheezing or rhonchi. Patient not requiring any oxygen supplementation.   Abdomen: Swollen and distended. No tenderness or rebound on palpation. Positive BS x 4 quadrants.  Musculoskeletal: 2+ pitting edema bilaterally. No cyanosis or clubbing.   Skin: Patient with 2 open wounds on the right chest demonstrating mild erythema and scant purulent discharge; patient describes some tenderness on palpation.    Psychiatry: Stable mood and appropriate affect. No hallucinations or suicidal ideations. Patient understanding of current condition and reason for hospitalization.   Data Reviewed: Basic Metabolic Panel: Recent Labs  Lab 12/15/17 1546  12/17/17 5621 12/18/17 3086 12/19/17 5784 12/20/17 0525 12/21/17 0430 12/22/17 0631  NA  --    < > 131* 131* 132* 132* 132* 129*  K  --    < > 3.6 3.7 3.6 3.1* 3.6 3.5  CL  --    < > 89* 88* 89* 88* 89* 86*  CO2  --    < > 29 28 29 30 30 29   GLUCOSE  --    < > 127* 122* 139* 139* 120* 130*  BUN  --    < > 20 20 22* 19 17 20   CREATININE  --    < > 0.82 0.69 0.87 0.90 0.66 0.73  CALCIUM  --    < > 9.0 9.4 9.1 9.2 8.9 8.8*  MG 2.0  --  1.8  --   --  1.8  --   --    < > = values in this interval not displayed.   CBC: Recent Labs  Lab 12/17/17 0638 12/20/17 0525  WBC 6.4 5.2  HGB 15.0 15.8*  HCT 46.4* 49.1*  MCV 91.0 91.6  PLT 196 198   BNP (last 3 results) Recent Labs    10/26/17 1611 11/17/17 1220 12/05/17 1422  BNP 395.0* 480.0* 400.0*   CBG: Recent Labs  Lab 12/21/17 0757 12/21/17 1245 12/21/17 1630 12/21/17 2138 12/22/17 0730  GLUCAP 117* 292* 179* 148* 182*    Studies: No results found.  Scheduled Meds: . aspirin EC  81 mg Oral Daily  . carvedilol  3.125 mg Oral BID WC  . docusate sodium  100 mg Oral BID  . enoxaparin  (LOVENOX) injection  75 mg Subcutaneous Q24H  . furosemide  80 mg Intravenous TID  . insulin aspart  0-9 Units Subcutaneous TID WC  . lisinopril  2.5 mg Oral Daily  . metolazone  2.5 mg Oral Daily  . neomycin-bacitracin-polymyxin   Topical BID  . pantoprazole  40 mg Oral Q0600  . potassium chloride  40 mEq Oral QID  . sodium chloride flush  3 mL Intravenous Q12H  . spironolactone  12.5 mg Oral Daily   Continuous Infusions: . sodium chloride       Time spent: 30 minutes    Vassie Loll  [facility] Pager [phone]. If 7PM-7AM, please contact night-coverage at [email or web address], password [facility] 12/22/2017, 11:22 AM  LOS: 17 days

## 2017-12-22 NOTE — Care Management Important Message (Signed)
Important Message  Patient Details  Name: Donna Graves MRN: [id number] Date of Birth: [birth year]   Medicare Important Message Given:  Yes    Renie Ora 12/22/2017, 10:16 AM

## 2017-12-22 NOTE — Progress Notes (Signed)
Progress Note  Patient Name: Donna Graves Date of Encounter: 12/22/2017  Primary Cardiologist: Dr. Dina Rich  Subjective   Reports gradually improving leg edema, no abdominal pain or chest pain.  No palpitations or shortness of breath at rest.  Inpatient Medications    Scheduled Meds: . aspirin EC  81 mg Oral Daily  . carvedilol  3.125 mg Oral BID WC  . docusate sodium  100 mg Oral BID  . enoxaparin (LOVENOX) injection  75 mg Subcutaneous Q24H  . furosemide  80 mg Intravenous TID  . insulin aspart  0-9 Units Subcutaneous TID WC  . lisinopril  2.5 mg Oral Daily  . metolazone  2.5 mg Oral Daily  . neomycin-bacitracin-polymyxin   Topical BID  . pantoprazole  40 mg Oral Q0600  . potassium chloride  40 mEq Oral QID  . sodium chloride flush  3 mL Intravenous Q12H  . spironolactone  12.5 mg Oral Daily   Continuous Infusions: . sodium chloride     PRN Meds: sodium chloride, acetaminophen, alum & mag hydroxide-simeth, bisacodyl, ondansetron, polyethylene glycol, sodium chloride flush, sodium phosphate   Vital Signs    Vitals:   12/21/17 2135 12/22/17 0700 12/22/17 0819 12/22/17 0840  BP: 106/65   128/75  Pulse: 70   72  Resp: 17   20  Temp: 98.4 F (36.9 C)   98 F (36.7 C)  TempSrc: Oral   Oral  SpO2: 93%  93% 96%  Weight:  (!) 327 lb 14.4 oz (148.7 kg)    Height:        Intake/Output Summary (Last 24 hours) at 12/22/2017 0857 Last data filed at 12/22/2017 0855 Gross per 24 hour  Intake 480 ml  Output 4225 ml  Net -3745 ml   Filed Weights   12/20/17 0556 12/21/17 0456 12/22/17 0700  Weight: (!) 328 lb 4.2 oz (148.9 kg) (!) 325 lb 4.8 oz (147.6 kg) (!) 327 lb 14.4 oz (148.7 kg)    Telemetry    Dual-chamber paced rhythm.  Personally reviewed.  Physical Exam   GEN:  Obese woman.  No acute distress.   Neck: No JVD. Cardiac: RRR, no gallop.  Respiratory: Nonlabored. Clear to auscultation bilaterally. GI:  Obese with pannus, nontender, bowel sounds  present. MS:  Bilateral leg edema and lymphedema. Neuro:  Nonfocal. Psych: Alert and oriented x 3. Normal affect.  Labs    Chemistry Recent Labs  Lab 12/20/17 0525 12/21/17 0430 12/22/17 0631  NA 132* 132* 129*  K 3.1* 3.6 3.5  CL 88* 89* 86*  CO2 30 30 29   GLUCOSE 139* 120* 130*  BUN 19 17 20   CREATININE 0.90 0.66 0.73  CALCIUM 9.2 8.9 8.8*  GFRNONAA >60 >60 >60  GFRAA >60 >60 >60  ANIONGAP 14 13 14      Hematology Recent Labs  Lab 12/17/17 0638 12/20/17 0525  WBC 6.4 5.2  RBC 5.10 5.36*  HGB 15.0 15.8*  HCT 46.4* 49.1*  MCV 91.0 91.6  MCH 29.4 29.5  MCHC 32.3 32.2  RDW 17.2* 17.3*  PLT 196 198    Radiology    No results found.  Cardiac Studies   Echocardiogram: 10/17/2017 Study Conclusions  - Left ventricle: The estimated ejection fraction was 25%. - Ventricular septum: Septal motion showed abnormal function and dyssynergy. - Mitral valve: Mildly calcified annulus. - Right ventricle: The cavity size was mildly dilated. Systolic function was moderately reduced.  Impressions:  - Limited study with Definity. Images are still quite limited.  LVEF estimated at approximately 25% based on the most consistent views. Cannot specifically comment on focal wall motion although there does look to be septal dyssynergy. There is also moderate right ventricular dysfunction.  Patient Profile     67 y.o. female with a history of chronic combined systolic and diastolic CHF (EF 45% by most recent ecjp), nonischemic cardiomyopathy (s/p BiV ICD placement in 05/2017 - unable to have LV lead placed), PAT, HTN, HLD, Type 2 DM, and morbid obesity currently admitted for acute CHF exacerbation.   Assessment & Plan    1.  Acute on chronic combined heart failure with LVEF 25%.  She continues to do very well with brisk diuresis on combination of IV Lasix and oral metolazone.  Net output of approximately 3400 cc last 24 hours and follow-up creatinine stable at  0.73.  She remains approximately 10 pounds over her baseline anticipated weight.  2.  Nonischemic cardiomyopathy with Medtronic ICD in place.  3.  Essential hypertension, blood pressure has been adequately controlled.  Continue with current diuretic plan, follow-up BMET in the morning.  Overall goal of 10 more pounds to get her close to previous baseline.  Signed, Nona Dell, MD  12/22/2017, 8:57 AM

## 2017-12-23 LAB — GLUCOSE, CAPILLARY
GLUCOSE-CAPILLARY: 138 mg/dL — AB (ref 65–99)
GLUCOSE-CAPILLARY: 154 mg/dL — AB (ref 65–99)
Glucose-Capillary: 208 mg/dL — ABNORMAL HIGH (ref 65–99)
Glucose-Capillary: 291 mg/dL — ABNORMAL HIGH (ref 65–99)

## 2017-12-23 LAB — BASIC METABOLIC PANEL
ANION GAP: 15 (ref 5–15)
BUN: 21 mg/dL — ABNORMAL HIGH (ref 6–20)
CALCIUM: 8.8 mg/dL — AB (ref 8.9–10.3)
CO2: 31 mmol/L (ref 22–32)
Chloride: 84 mmol/L — ABNORMAL LOW (ref 101–111)
Creatinine, Ser: 0.71 mg/dL (ref 0.44–1.00)
GFR calc non Af Amer: >60 mL/min (ref >60)
Glucose, Bld: 127 mg/dL — ABNORMAL HIGH (ref 65–99)
Potassium: 3.2 mmol/L — ABNORMAL LOW (ref 3.5–5.1)
Sodium: 130 mmol/L — ABNORMAL LOW (ref 135–145)

## 2017-12-23 LAB — CBC
HEMATOCRIT: 46.9 % — AB (ref 36.0–46.0)
Hemoglobin: 15.3 g/dL — ABNORMAL HIGH (ref 12.0–15.0)
MCH: 29.8 pg (ref 26.0–34.0)
MCHC: 32.6 g/dL (ref 30.0–36.0)
MCV: 91.2 fL (ref 78.0–100.0)
Platelets: 181 10*3/uL (ref 150–400)
RBC: 5.14 MIL/uL — ABNORMAL HIGH (ref 3.87–5.11)
RDW: 16.9 % — AB (ref 11.5–15.5)
WBC: 5.2 10*3/uL (ref 4.0–10.5)

## 2017-12-23 NOTE — Progress Notes (Signed)
[facility] PROGRESS NOTE  SALONI HEDGEPETH SAY:[id number] DOB: 1951-05-03 DOA: 12/05/2017 PCP: Wilson Singer, MD   Interim summary and HPI:  67 y.o morbidly obese female with history of NICM (EF 25%), status post AICD, diabetes mellitus and A. Fib (currently not on anticoagulation, only aspirin 81mg ); who came  to the hospital on 12/05/17 with over a week of increased weight gain of over 27 pounds and complaining of SOB and swelling of legs/abdomen.   Patient has been diuresing well with an output of 2.8L in the past 24 hrs and 36.0 L since being admitted.   Assessment/Plan: Acute on chronic combined systolic and diastolic CHF -patient has a reduced EF of 25% by most recent echo in 09/2017. -She has a total output of approximately 37.0 L since admission, (3.04 L in the past 24 hrs). Her weight is down from 342 lbs on admission to 324 lbs today. -baseline weight estimated between 312-316 lbs  -Continue aggressive IV diuresis (using IV lasix TID 80mg  and metolazone)  -renal function has remained stable and so far tolerating current diuresis  -appreciate cardiology service assistance and guidance; will follow recommendations. -continue also home medications for HF as dictated by cardiology service.   Hypokalemia  -in the setting of diuresis -K at 3.2 -will follow electrolytes trend and continue repletion/daily supplementation  Hypertension -Stable -will continue current dose of coreg and lisinopril -continue heart healthy diet  -follow same dose of lasix as recommended by cardiology   Paroxysmal atrial tachycardia -has remained stable -continue ASA and B-blocker  -continue outpatient follow up with EP (Dr. Ladona Ridgel)  Nonischemic cardiomyopathy -AICD placed on 05/2017 -Followed by Dr. Ladona Ridgel as an outpatient -no ICD firing or arrhythmia appreciated -will continue telemetry monitoring  Diabetes Mellitus type II -overall stable and well controlled -Last A1c at  7.8 -Continue holding oral hypoglycemic agents while inpatient -continue SSI  Open Right Chest wounds -Patient has two open wounds in right chest (2cm and 1 cm), she states that it happened on the day of admission (3/19) when the telemetry leads were pulled off abruptly from her skin. The wounds have a purulent discharge and some surrounding erythema. Patient is afebrile and has no signs of systemic involvement. -Continue Neosporin ointment BID -Wounds are healing appropriately.  Morbid Obesity -Body mass index is 55.7 kg/m. -Patient is being followed by [facility] and is planning to undergo bariatric surgery in the near future.  -patient encourage to continue low calorie diet and increase exercise    DVT Prophylaxis: Lovenox Code Status: Full code Family Communication: Husband at bedside Disposition Plan: Keep inpatient, continue IV diuresis, patient still has a lot of fluid in abdominal area as well as bilateral lower limbs.    Consultants:  Cardiology  Procedures:  Chest X-ray IMPRESSION: Pulmonary vascular congestion without edema or consolidation. Pacemaker leads are attached to the right atrium, right ventricle, and coronary sinus. No adenopathy. No bone lesions.  Antibiotics:  Neosporin topical 3/27  HPI/Subjective: No fever, breathing continues slowly improving, patient denies chest pain.  Still with signs of fluid overload on exam and reports continued to have good urine output  Objective: Vitals:   12/22/17 2200 12/23/17 0532  BP: 110/68 127/75  Pulse:  74  Resp:    Temp:  97.9 F (36.6 C)  SpO2:  91%    Intake/Output Summary (Last 24 hours) at 12/23/2017 1601 Last data filed at 12/23/2017 0500 Gross per 24 hour  Intake 603 ml  Output 3650 ml  Net -3047 ml   Filed Weights   12/21/17 0456 12/22/17 0700 12/23/17 0532  Weight: (!) 147.6 kg (325 lb 4.8 oz) (!) 148.7 kg (327 lb 14.4 oz) (!) 147.2 kg (324 lb 8.3 oz)    Exam:   General:  No fever, AAOX3, denies chest pain, no nausea vomiting.  Reports her breathing continue to slowly improve.    Cardiovascular: RRR.  S1 and S2, no rubs, no gallops, no murmurs.   Respiratory: Improved air movement, decreased breath sounds at the bases, no wheezing, no frank crackles.  No using accessory muscles and with good oxygen saturation on room air.    Abdomen: Continued to be swelling and distended (patient with 2-3+ DP and edema in her torso), positive bowel sounds, no tenderness, no guarding.  Musculoskeletal: TED hoses in place, no cyanosis, no clubbing, 2+ edema appreciated on exam.    Skin: Patient right chest wounds continue to improve and heal, currently without any active discharge, just mild surrounding erythema, patient denies any pain.  Psychiatry: Stable mood, normal affect, no hallucinations or suicidal ideation.    Data Reviewed: Basic Metabolic Panel: Recent Labs  Lab 12/17/17 0638  12/19/17 0605 12/20/17 0525 12/21/17 0430 12/22/17 0631 12/23/17 0554  NA 131*   < > 132* 132* 132* 129* 130*  K 3.6   < > 3.6 3.1* 3.6 3.5 3.2*  CL 89*   < > 89* 88* 89* 86* 84*  CO2 29   < > 29 30 30 29 31   GLUCOSE 127*   < > 139* 139* 120* 130* 127*  BUN 20   < > 22* 19 17 20  21*  CREATININE 0.82   < > 0.87 0.90 0.66 0.73 0.71  CALCIUM 9.0   < > 9.1 9.2 8.9 8.8* 8.8*  MG 1.8  --   --  1.8  --   --   --    < > = values in this interval not displayed.   CBC: Recent Labs  Lab 12/17/17 0638 12/20/17 0525 12/23/17 0554  WBC 6.4 5.2 5.2  HGB 15.0 15.8* 15.3*  HCT 46.4* 49.1* 46.9*  MCV 91.0 91.6 91.2  PLT 196 198 181   BNP (last 3 results) Recent Labs    10/26/17 1611 11/17/17 1220 12/05/17 1422  BNP 395.0* 480.0* 400.0*   CBG: Recent Labs  Lab 12/22/17 1156 12/22/17 1652 12/22/17 2101 12/23/17 0736 12/23/17 1141  GLUCAP 146* 154* 188* 138* 208*    Studies: No results found.  Scheduled Meds: . aspirin EC  81 mg Oral Daily  . carvedilol  3.125 mg Oral  BID WC  . docusate sodium  100 mg Oral BID  . enoxaparin (LOVENOX) injection  75 mg Subcutaneous Q24H  . furosemide  80 mg Intravenous TID  . insulin aspart  0-9 Units Subcutaneous TID WC  . lisinopril  2.5 mg Oral Daily  . metolazone  2.5 mg Oral Daily  . neomycin-bacitracin-polymyxin   Topical BID  . pantoprazole  40 mg Oral Q0600  . potassium chloride  40 mEq Oral QID  . sodium chloride flush  3 mL Intravenous Q12H  . spironolactone  12.5 mg Oral Daily   Continuous Infusions: . sodium chloride       Time spent: 30 minutes    Vassie Loll  [facility] Pager [phone]. If 7PM-7AM, please contact night-coverage at [email or web address], password [facility] 12/23/2017, 4:01 PM  LOS: 18 days

## 2017-12-24 LAB — BASIC METABOLIC PANEL
Anion gap: 15 (ref 5–15)
BUN: 21 mg/dL — AB (ref 6–20)
CHLORIDE: 85 mmol/L — AB (ref 101–111)
CO2: 31 mmol/L (ref 22–32)
Calcium: 9.2 mg/dL (ref 8.9–10.3)
Creatinine, Ser: 0.78 mg/dL (ref 0.44–1.00)
GFR calc Af Amer: >60 mL/min (ref >60)
GFR calc non Af Amer: >60 mL/min (ref >60)
GLUCOSE: 139 mg/dL — AB (ref 65–99)
POTASSIUM: 3.6 mmol/L (ref 3.5–5.1)
Sodium: 131 mmol/L — ABNORMAL LOW (ref 135–145)

## 2017-12-24 LAB — GLUCOSE, CAPILLARY
GLUCOSE-CAPILLARY: 146 mg/dL — AB (ref 65–99)
Glucose-Capillary: 146 mg/dL — ABNORMAL HIGH (ref 65–99)
Glucose-Capillary: 205 mg/dL — ABNORMAL HIGH (ref 65–99)
Glucose-Capillary: 148 mg/dL — ABNORMAL HIGH (ref 65–99)

## 2017-12-24 LAB — MAGNESIUM: Magnesium: 1.8 mg/dL (ref 1.7–2.4)

## 2017-12-24 NOTE — Progress Notes (Signed)
[facility] PROGRESS NOTE  Donna Graves BZJ:[id number] DOB: March 05, 1951 66/19[birth year]2019 PCP: Wilson Singer, MD   Interim summary and HPI:  67 y.o morbidly obese female with history of NICM (EF 25%), status post AICD, diabetes mellitus and A. Fib (currently not on anticoagulation, only aspirin 81mg ); who came  to the hospital on 12/05/17 with over a week of increased weight gain of over 27 pounds and complaining of SOB and swelling of legs/abdomen.   Patient has been diuresing well with an output of 2.8L in the past 24 hrs and 36.0 L since being admitted.   Assessment/Plan: Acute on chronic combined systolic and diastolic CHF -patient has a reduced EF of 25% by most recent echo in 09/2017. -She has a total output of approximately 37.0 L since admission, (2.070 L in the past 16 hrs). Her weight is down from 342 lbs on admission to 327 lbs today; patient went up 3 pounds overnight. -baseline weight estimated between 312-316 lbs  -Continue aggressive IV diuresis (using IV lasix TID 80mg  and metolazone)  -renal function has remained stable and so far tolerating current diuresis  -appreciate cardiology service assistance and guidance; will follow recommendations. -continue also home medications for HF as dictated by cardiology service.  -patient has lost IV site and missed her lasix dose  -AC placing IV access.  Hypokalemia  -in the setting of diuresis -K at 3.6 -will follow electrolytes trend and continue repletion/daily supplementation  Hypertension -Stable -will continue current dose of coreg and lisinopril -continue heart healthy diet  -follow same dose of lasix as recommended by cardiology   Paroxysmal atrial tachycardia -has remained stable -continue ASA and B-blocker  -continue outpatient follow up with EP (Dr. Ladona Ridgel)  Nonischemic cardiomyopathy -AICD placed on 05/2017 -Followed by Dr. Ladona Ridgel as an outpatient -no ICD firing or arrhythmia appreciated -will  continue telemetry monitoring  Diabetes Mellitus type II -overall stable and well controlled -Last A1c at 7.8 -Continue holding oral hypoglycemic agents while inpatient -continue SSI  Open Right Chest wounds -Patient has two open wounds in right chest (2cm and 1 cm), she states that it happened on the day of admission (3/19) when the telemetry leads were pulled off abruptly from her skin. The wounds have a purulent discharge and some surrounding erythema. Patient is afebrile and has no signs of systemic involvement. -Continue Neosporin ointment BID -Wounds are healing appropriately.  Morbid Obesity -Body mass index is 56.16 kg/m.  -Patient is being followed by [facility] and is planning to undergo bariatric surgery in the near future.  -patient encourage to continue low calorie diet and increase exercise    DVT Prophylaxis: Lovenox Code Status: Full code Family Communication: Husband at bedside Disposition Plan: Keep inpatient, continue IV diuresis, patient still has a lot of fluid in abdominal area as well as bilateral lower limbs.    Consultants:  Cardiology  Procedures:  Chest X-ray IMPRESSION: Pulmonary vascular congestion without edema or consolidation. Pacemaker leads are attached to the right atrium, right ventricle, and coronary sinus. No adenopathy. No bone lesions.  Antibiotics:  Neosporin topical 3/27  HPI/Subjective: No fever, no nausea, no vomiting.  Patient is denies chest pain and reports overall improvement in her breathing.  Continued to have signs of fluid overload on exam.  Currently without IV and missing her Lasix dose.  Still reporting good urine output.  Objective: Vitals:   12/23/17 2327 12/24/17 0626  BP:  98/67  Pulse:  68  Resp:  18  Temp:  98.1 F (36.7 C)  SpO2: 100% 97%    Intake/Output Summary (Last 24 hours) at 12/24/2017 1210 Last data filed at 12/24/2017 0421 Gross per 24 hour  Intake 380 ml  Output 2450 ml  Net  -2070 ml   Filed Weights   12/22/17 0700 12/23/17 0532 12/24/17 0626  Weight: (!) 148.7 kg (327 lb 14.4 oz) (!) 147.2 kg (324 lb 8.3 oz) (!) 148.4 kg (327 lb 3.2 oz)    Exam:   General: No fever, alert, awake and oriented x3, no chest pain, no nausea or vomiting.  Patient has lost her IV access and has no being able to receive her Lasix dose today.  Continued to report good urine output.   Cardiovascular: RRR.  S1 and S2, no rubs, no gallops, no murmurs.   Respiratory: Good air movement bilaterally, no frank crackles, no wheezing, no using accessory muscles, good O2 sat on room air.      Abdomen: Obese, continued to be swollen and distended (2+ dependent edema appreciated in the skin around her torso/abdomen).  No rebounds, positive bowel sounds, no tenderness.    Musculoskeletal: 1-2+ edema bilaterally up to her thighs, no cyanosis or clubbing.  Skin: Much improved chest wounds, no supination, just mild erythema; per patient wounds happen after telemetry leads were appropriately taken off).  No other open wounds or induration appreciated on exam.  Psychiatry: Stable mood, normal affect, no hallucinations or suicidal ideation.   Data Reviewed: Basic Metabolic Panel: Recent Labs  Lab 12/20/17 0525 12/21/17 0430 12/22/17 0631 12/23/17 0554 12/24/17 0817  NA 132* 132* 129* 130* 131*  K 3.1* 3.6 3.5 3.2* 3.6  CL 88* 89* 86* 84* 85*  CO2 30 30 29 31 31   GLUCOSE 139* 120* 130* 127* 139*  BUN 19 17 20  21* 21*  CREATININE 0.90 0.66 0.73 0.71 0.78  CALCIUM 9.2 8.9 8.8* 8.8* 9.2  MG 1.8  --   --   --  1.8   CBC: Recent Labs  Lab 12/20/17 0525 12/23/17 0554  WBC 5.2 5.2  HGB 15.8* 15.3*  HCT 49.1* 46.9*  MCV 91.6 91.2  PLT 198 181   BNP (last 3 results) Recent Labs    10/26/17 1611 11/17/17 1220 12/05/17 1422  BNP 395.0* 480.0* 400.0*   CBG: Recent Labs  Lab 12/23/17 1141 12/23/17 1652 12/23/17 2138 12/24/17 0742 12/24/17 1137  GLUCAP 208* 291* 154* 146*  205*    Studies: No results found.  Scheduled Meds: . aspirin EC  81 mg Oral Daily  . carvedilol  3.125 mg Oral BID WC  . docusate sodium  100 mg Oral BID  . enoxaparin (LOVENOX) injection  75 mg Subcutaneous Q24H  . furosemide  80 mg Intravenous TID  . insulin aspart  0-9 Units Subcutaneous TID WC  . lisinopril  2.5 mg Oral Daily  . metolazone  2.5 mg Oral Daily  . neomycin-bacitracin-polymyxin   Topical BID  . pantoprazole  40 mg Oral Q0600  . potassium chloride  40 mEq Oral QID  . sodium chloride flush  3 mL Intravenous Q12H  . spironolactone  12.5 mg Oral Daily   Continuous Infusions: . sodium chloride       Time spent: 30 minutes    Vassie Loll  [facility] Pager [phone]. If 7PM-7AM, please contact night-coverage at [email or web address], password [facility] 12/24/2017, 12:10 PM  LOS: 19 days

## 2017-12-25 LAB — BASIC METABOLIC PANEL
Anion gap: 12 (ref 5–15)
BUN: 21 mg/dL — AB (ref 6–20)
CO2: 33 mmol/L — ABNORMAL HIGH (ref 22–32)
CREATININE: 0.79 mg/dL (ref 0.44–1.00)
Calcium: 8.9 mg/dL (ref 8.9–10.3)
Chloride: 86 mmol/L — ABNORMAL LOW (ref 101–111)
GFR calc Af Amer: >60 mL/min (ref >60)
Glucose, Bld: 139 mg/dL — ABNORMAL HIGH (ref 65–99)
POTASSIUM: 3.3 mmol/L — AB (ref 3.5–5.1)
SODIUM: 131 mmol/L — AB (ref 135–145)

## 2017-12-25 LAB — GLUCOSE, CAPILLARY
GLUCOSE-CAPILLARY: 129 mg/dL — AB (ref 65–99)
Glucose-Capillary: 238 mg/dL — ABNORMAL HIGH (ref 65–99)

## 2017-12-25 MED ORDER — CARVEDILOL 3.125 MG PO TABS: 3.1250 mg | ORAL_TABLET | Freq: Two times a day (BID) | ORAL | 0 refills | Status: AC

## 2017-12-25 MED ORDER — TORSEMIDE 20 MG PO TABS
80.0000 mg | ORAL_TABLET | Freq: Two times a day (BID) | ORAL | Status: DC
  Administered 2017-12-25: 80 mg via ORAL
  Filled 2017-12-25: qty 4

## 2017-12-25 MED ORDER — METOLAZONE 2.5 MG PO TABS: 2.5000 mg | ORAL_TABLET | Freq: Every day | ORAL | 0 refills | Status: DC

## 2017-12-25 MED ORDER — SPIRONOLACTONE 25 MG PO TABS: 12.5000 mg | ORAL_TABLET | Freq: Every day | ORAL | 0 refills | Status: DC

## 2017-12-25 MED ORDER — POTASSIUM CHLORIDE CRYS ER 20 MEQ PO TBCR
40.0000 meq | EXTENDED_RELEASE_TABLET | Freq: Every day | ORAL | Status: DC
  Administered 2017-12-25: 40 meq via ORAL
  Filled 2017-12-25: qty 2

## 2017-12-25 MED ORDER — POTASSIUM CHLORIDE CRYS ER 20 MEQ PO TBCR: 40.0000 meq | EXTENDED_RELEASE_TABLET | Freq: Every day | ORAL | 0 refills | Status: AC

## 2017-12-25 NOTE — Care Management Note (Signed)
Case Management Note  Patient Details  Name: DASHAI BIESE MRN: [id number] Date of Birth: 12-08-1950   Expected Discharge Date:  12/25/17               Expected Discharge Plan:  Home w Home Health Services  In-House Referral:  NA  Discharge planning Services  CM Consult  Post Acute Care Choice:  Resumption of Svcs/PTA Provider, Home Health Choice offered to:  Patient  HH Arranged:  RN, PT [facility] Agency:  Other - See comment  Status of Service:  Completed, signed off  Additional Comments: Discharging home today with resumption of HH services. Kipp Brood [facility] rep aware of DC, info has been faxed and received. They have scheduled appointment for home visit tomorrow. [facility]HA aware pt high risk for re-hospitalization.   Malcolm Metro, RN 12/25/2017, 12:31 PM

## 2017-12-25 NOTE — Progress Notes (Signed)
Progress Note  Patient Name: Donna Graves Date of Encounter: 12/25/2017  Primary Cardiologist: Dr. Dina Rich  Subjective   States that she would like to go home today.  She does not report any breathlessness or palpitations.  Leg swelling has continued to improve.  Inpatient Medications    Scheduled Meds: . aspirin EC  81 mg Oral Daily  . carvedilol  3.125 mg Oral BID WC  . docusate sodium  100 mg Oral BID  . enoxaparin (LOVENOX) injection  75 mg Subcutaneous Q24H  . furosemide  80 mg Intravenous TID  . insulin aspart  0-9 Units Subcutaneous TID WC  . lisinopril  2.5 mg Oral Daily  . metolazone  2.5 mg Oral Daily  . neomycin-bacitracin-polymyxin   Topical BID  . pantoprazole  40 mg Oral Q0600  . potassium chloride  40 mEq Oral QID  . sodium chloride flush  3 mL Intravenous Q12H  . spironolactone  12.5 mg Oral Daily   Continuous Infusions: . sodium chloride     PRN Meds: sodium chloride, acetaminophen, alum & mag hydroxide-simeth, bisacodyl, ondansetron, polyethylene glycol, sodium chloride flush, sodium phosphate   Vital Signs    Vitals:   12/24/17 2115 12/25/17 0611 12/25/17 0614 12/25/17 0617  BP: 101/77  (!) 85/43 118/71  Pulse: 92  74 71  Resp: 18  20   Temp: 97.9 F (36.6 C)  (!) 97.5 F (36.4 C)   TempSrc: Oral  Oral   SpO2: 96%  92%   Weight:  (!) 330 lb 14.6 oz (150.1 kg)    Height:        Intake/Output Summary (Last 24 hours) at 12/25/2017 0849 Last data filed at 12/25/2017 0612 Gross per 24 hour  Intake 0 ml  Output 3100 ml  Net -3100 ml   Filed Weights   12/23/17 0532 12/24/17 0626 12/25/17 0611  Weight: (!) 324 lb 8.3 oz (147.2 kg) (!) 327 lb 3.2 oz (148.4 kg) (!) 330 lb 14.6 oz (150.1 kg)    Telemetry    Paced rhythm with occasional PVCs.  Personally reviewed.  Physical Exam   GEN:  Morbidly obese woman.  No acute distress.   Neck: No JVD. Cardiac: RRR, no murmur, rub, or gallop.  Respiratory: Nonlabored. Clear to  auscultation bilaterally. GI: Obese with pannus,, nontender, bowel sounds present. MS:  Significantly improved bilateral leg edema and lymphedema.; No deformity. Neuro:  Nonfocal. Psych: Alert and oriented x 3. Normal affect.  Labs    Chemistry Recent Labs  Lab 12/23/17 0554 12/24/17 0817 12/25/17 0620  NA 130* 131* 131*  K 3.2* 3.6 3.3*  CL 84* 85* 86*  CO2 31 31 33*  GLUCOSE 127* 139* 139*  BUN 21* 21* 21*  CREATININE 0.71 0.78 0.79  CALCIUM 8.8* 9.2 8.9  GFRNONAA >60 >60 >60  GFRAA >60 >60 >60  ANIONGAP 15 15 12      Hematology Recent Labs  Lab 12/20/17 0525 12/23/17 0554  WBC 5.2 5.2  RBC 5.36* 5.14*  HGB 15.8* 15.3*  HCT 49.1* 46.9*  MCV 91.6 91.2  MCH 29.5 29.8  MCHC 32.2 32.6  RDW 17.3* 16.9*  PLT 198 181    Radiology    No results found.  Cardiac Studies   Echocardiogram: 10/17/2017 Study Conclusions  - Left ventricle: The estimated ejection fraction was 25%. - Ventricular septum: Septal motion showed abnormal function and dyssynergy. - Mitral valve: Mildly calcified annulus. - Right ventricle: The cavity size was mildly dilated. Systolic function  was moderately reduced.  Impressions:  - Limited study with Definity. Images are still quite limited. LVEF estimated at approximately 25% based on the most consistent views. Cannot specifically comment on focal wall motion although there does look to be septal dyssynergy. There is also moderate right ventricular dysfunction.  Patient Profile     67 y.o. female with a history of chronic combined systolic and diastolic CHF (EF 83% by most recent ecjp), nonischemic cardiomyopathy (s/p BiV ICD placement in 05/2017 - unable to have LV lead placed), PAT, HTN, HLD, Type 2 DM, and morbid obesity currently admitted for acute CHF exacerbation.  Assessment & Plan    1.  Acute on chronic combined heart failure with LVEF 25%.  Although weights have fluctuated up and down, she has had a  consistent diuresis averaging about 3 L net output daily for the last week on combination of metolazone and IV Lasix.  Renal function has also remained stable.  She states that she has been feeling better and would like to go home today.  2.  Nonischemic cardiomyopathy with [employer] ICD in place.  3.  Essential hypertension with adequately controlled blood pressure.  Plan discussed with patient.  She feels like she has made improvements and would like to go home.  She still needs to have close follow-up to ensure adequate diuresis on oral regimen.  Would recommend continuing Zaroxolyn 2.5 mg on a daily basis, convert back to Demadex 80 mg twice daily, continue Aldactone, and continue with potassium supplementation at 40 mEq daily.  She has increased risk of electrolyte abnormalities and renal dysfunction - will need to have BMET with office visit in a week.  Signed, Nona Dell, MD  12/25/2017, 8:49 AM

## 2017-12-25 NOTE — Progress Notes (Signed)
Pt IV removed, WNL. D/C instructions given to pt. Verbalized understanding. Pt spouse at bedside to transport home. Insulin to be given before D/C.

## 2017-12-25 NOTE — Discharge Summary (Signed)
Physician Discharge Summary  Donna Graves ZOX:[id number] DOB: 10-28-1950 DOA: 12/05/2017  PCP: Wilson Singer, MD  Admit date: 12/05/2017 Discharge date: 12/25/2017  Admitted From: Home Disposition:  Home  Recommendations for Outpatient Follow-up and new medication changes:  1. Follow up with PCP in 1- week.  2. Carvedilol has been decreased to 3,125 mg bid 3. Metolazone has been decreased to 2,5 mg daily 4. Continue torsemide 80 mg po bid 5. Added spironolactone 12.5 mg daily 6. Added KCl 40 meq daily 7. Will need close follow up on renal function and electrolytes.  8. Follow-up with cardiology clinic within 7 days.  Home Health: yes  Equipment/Devices: no    Discharge Condition: stable CODE STATUS: full  Diet recommendation: Heart healthy and diabetic prudent.   Brief/Interim Summary: 67 year old female who presented with dyspnea and weight gain. She does have the significant past medical history for systolic heart failure status post AICD, atrial fibrillation, type 2 diabetes mellitus and morbid obesity. Patient reported 7 days of worsening symptoms, with a 27 pound weight gain. On the initial physical examination blood pressure 111/80, 93/64, heart rate 71, respiratory rate 17-20, temperature 98.8, oxygen saturation 97%. Moist mucous membranes, lungs were clear to auscultation bilaterally, heart S1-S2 present, no rubs or gallops, abdomen soft nontender, no lower extremity edema. Sodium 133, potassium 3.0, chloride 94, bicarbonate 34, glucose 124, BUN 25, creatinine 1.12, white count 5.3, hemoglobin 15.4, hematocrit 48.6, platelets 155. Chest x-ray with cardiomegaly, increased insterstitial markings bilaterally, EKG, a sensed, V paced with consequent left axis and left bundle branch block.   Patient was admitted to the hospital with the working diagnosis of acute decompensated chronic systolic heart failure.  1. Acute on chronic systolic heart failure exacerbation. Nonischemic  cardiomyopathy, left ventricular ejection fraction 25% status post AICD ( 2018).  Patient was admitted to the medical ward, she was placed on a remote telemetry monitor, aggressive  diuresis with IV furosemide, negative fluid balance was achieved with significant improvement of her symptoms, (-38,567 ml negative) . Patient was successfully transitioned to oral diuretics with torsemide 80 mg twice daily, Aldactone was added 12.5 mg daily, continue metolazone 2.5 mg daily. Patient was continued on heart failure regimen with carvedilol and lisinopril. She will follow up with outpatient cardiology clinic.   2. Paroxysmal atrial fibrillation. Patient remained paced, continue carvedilol. Anticoagulation with aspirin.  3. Hypertension. Blood pressure remained well controlled, continue lisinopril and carvedilol. Patient tolerated well diuresis.  4. Type 2 diabetes mellitus. Patient was placed on insulin sliding scale for glucose coverage and monitoring, capillary glucose remained well-controlled, 146, 205, 148, 146, 129. Resume glimepiride at discharge.   5. Right chest wounds (present on admission). Treated with  local antibiotic therapy.  6. Morbid obesity. BMI 56.1, patient will follow up with outpatient weight loss clinic. Patient will be discharged with home health services.   7. Hypokalemia. Patient had electrolytes corrected, she will be discharged on 40 mEq of potassium chloride daily. Close follow-up of kidney function and electrolytes, note that patient is on spironolactone and lisinopril. Discharge Potassium 3.3, creatinine 0.79, bicarbonate 33.   Discharge Diagnoses:  Principal Problem:   Acute on chronic congestive heart failure (HCC) Active Problems:   Type II diabetes mellitus (HCC)   AF (paroxysmal atrial fibrillation) (HCC)   Essential hypertension   Elevated troponin   Morbid obesity (HCC)   CHF (congestive heart failure) (HCC)   Anasarca   Hypokalemia   Cardiomyopathy (HCC)    Demand ischemia (HCC)  Hyponatremia    Discharge Instructions   Allergies as of 12/25/2017      Reactions   Codeine Rash      Medication List    STOP taking these medications   BLACK CHERRY CONCENTRATE PO   RED YEAST RICE PO     TAKE these medications   acetaminophen 650 MG CR tablet Commonly known as:  TYLENOL Take 650-1,300 mg by mouth every 8 (eight) hours as needed for pain.   acidophilus Caps capsule Take 1 capsule by mouth daily. ULTIMATE FLORA PROBIOTIC   ALIVE ONCE DAILY WOMENS 50+ PO Take 1 tablet by mouth daily.   ARMOUR THYROID 15 MG tablet Generic drug:  thyroid Take 1 tablet by mouth daily.   aspirin 81 MG EC tablet Take 1 tablet (81 mg total) by mouth daily.   carvedilol 3.125 MG tablet Commonly known as:  COREG Take 1 tablet (3.125 mg total) by mouth 2 (two) times daily with a meal. What changed:    medication strength  how much to take   dicyclomine 10 MG capsule Commonly known as:  BENTYL Take 10 mg by mouth 3 (three) times daily as needed for spasms.   glimepiride 2 MG tablet Commonly known as:  AMARYL Take 4 mg by mouth daily. BEFORE A MEAL   lisinopril 2.5 MG tablet Commonly known as:  PRINIVIL,ZESTRIL Take 1 tablet (2.5 mg total) by mouth daily.   magnesium oxide 400 MG tablet Commonly known as:  MAG-OX Take 400 mg by mouth daily.   metolazone 2.5 MG tablet Commonly known as:  ZAROXOLYN Take 1 tablet (2.5 mg total) by mouth daily. Start taking on:  12/26/2017 What changed:    medication strength  how much to take  when to take this  additional instructions   omeprazole 20 MG capsule Commonly known as:  PRILOSEC Take 20 mg by mouth every morning.   polycarbophil 625 MG tablet Commonly known as:  FIBERCON Take 625 mg by mouth 4 (four) times daily.   potassium chloride SA 20 MEQ tablet Commonly known as:  K-DUR,KLOR-CON Take 2 tablets (40 mEq total) by mouth daily. Start taking on:  12/26/2017 What changed:  how  much to take   simvastatin 40 MG tablet Commonly known as:  ZOCOR Take 40 mg by mouth every evening.   spironolactone 25 MG tablet Commonly known as:  ALDACTONE Take 0.5 tablets (12.5 mg total) by mouth daily. Start taking on:  12/26/2017   torsemide 20 MG tablet Commonly known as:  DEMADEX Take 4 tablets (80 mg total) by mouth 2 (two) times daily.   VITAMIN D PO Take 7,000 Units by mouth daily.       Allergies  Allergen Reactions  . Codeine Rash    Consultations:  Cardiology    Procedures/Studies: Dg Chest 2 View  Result Date: 12/05/2017 CLINICAL DATA:  Shortness of Breath EXAM: CHEST - 2 VIEW COMPARISON:  November 17, 2017 FINDINGS: There is no edema or consolidation. There is cardiomegaly with pulmonary venous hypertension. Pacemaker leads are attached to the right atrium, right ventricle, and coronary sinus. No adenopathy. No bone lesions. IMPRESSION: Pulmonary vascular congestion without edema or consolidation. Pacemaker leads as noted. Electronically Signed   By: Bretta Bang III M.D.   On: 12/05/2017 13:52       Subjective: Patient is feeling well significant improvement on her symptoms, feeling back to her baseline, no nausea or vomiting, no chest pain.   Discharge Exam: Vitals:   12/25/17 1610  12/25/17 0617  BP: (!) 85/43 118/71  Pulse: 74 71  Resp: 20   Temp: (!) 97.5 F (36.4 C)   SpO2: 92%    Vitals:   12/24/17 2115 12/25/17 0611 12/25/17 0614 12/25/17 0617  BP: 101/77  (!) 85/43 118/71  Pulse: 92  74 71  Resp: 18  20   Temp: 97.9 F (36.6 C)  (!) 97.5 F (36.4 C)   TempSrc: Oral  Oral   SpO2: 96%  92%   Weight:  (!) 150.1 kg (330 lb 14.6 oz)    Height:        General: Not in pain or dyspnea.  Neurology: Awake and alert, non focal  E ENT: no pallor, no icterus, oral mucosa moist Cardiovascular: No JVD. S1-S2 present, rhythmic, no gallops, rubs, or murmurs. No lower extremity edema. Pulmonary: decreased breath sounds bilaterally,  adequate air movement, no wheezing, rhonchi or rales. Gastrointestinal. Abdomen protuberant, no organomegaly, non tender, no rebound or guarding Skin. No rashes Musculoskeletal: no joint deformities   The results of significant diagnostics from this hospitalization (including imaging, microbiology, ancillary and laboratory) are listed below for reference.     Microbiology: No results found for this or any previous visit (from the past 240 hour(s)).   Labs: BNP (last 3 results) Recent Labs    10/26/17 1611 11/17/17 1220 12/05/17 1422  BNP 395.0* 480.0* 400.0*   Basic Metabolic Panel: Recent Labs  Lab 12/20/17 0525 12/21/17 0430 12/22/17 0631 12/23/17 0554 12/24/17 0817 12/25/17 0620  NA 132* 132* 129* 130* 131* 131*  K 3.1* 3.6 3.5 3.2* 3.6 3.3*  CL 88* 89* 86* 84* 85* 86*  CO2 30 30 29 31 31  33*  GLUCOSE 139* 120* 130* 127* 139* 139*  BUN 19 17 20  21* 21* 21*  CREATININE 0.90 0.66 0.73 0.71 0.78 0.79  CALCIUM 9.2 8.9 8.8* 8.8* 9.2 8.9  MG 1.8  --   --   --  1.8  --    Liver Function Tests: No results for input(s): AST, ALT, ALKPHOS, BILITOT, PROT, ALBUMIN in the last 168 hours. No results for input(s): LIPASE, AMYLASE in the last 168 hours. No results for input(s): AMMONIA in the last 168 hours. CBC: Recent Labs  Lab 12/20/17 0525 12/23/17 0554  WBC 5.2 5.2  HGB 15.8* 15.3*  HCT 49.1* 46.9*  MCV 91.6 91.2  PLT 198 181   Cardiac Enzymes: No results for input(s): CKTOTAL, CKMB, CKMBINDEX, TROPONINI in the last 168 hours. BNP: Invalid input(s): POCBNP CBG: Recent Labs  Lab 12/24/17 0742 12/24/17 1137 12/24/17 1635 12/24/17 2114 12/25/17 0744  GLUCAP 146* 205* 148* 146* 129*   D-Dimer No results for input(s): DDIMER in the last 72 hours. Hgb A1c No results for input(s): HGBA1C in the last 72 hours. Lipid Profile No results for input(s): CHOL, HDL, LDLCALC, TRIG, CHOLHDL, LDLDIRECT in the last 72 hours. Thyroid function studies No results for  input(s): TSH, T4TOTAL, T3FREE, THYROIDAB in the last 72 hours.  Invalid input(s): FREET3 Anemia work up No results for input(s): VITAMINB12, FOLATE, FERRITIN, TIBC, IRON, RETICCTPCT in the last 72 hours. Urinalysis    Component Value Date/Time   COLORURINE YELLOW 11/17/2017 1300   APPEARANCEUR CLEAR 11/17/2017 1300   LABSPEC 1.005 11/17/2017 1300   PHURINE 8.0 11/17/2017 1300   GLUCOSEU NEGATIVE 11/17/2017 1300   HGBUR NEGATIVE 11/17/2017 1300   BILIRUBINUR NEGATIVE 11/17/2017 1300   KETONESUR NEGATIVE 11/17/2017 1300   PROTEINUR NEGATIVE 11/17/2017 1300   NITRITE NEGATIVE 11/17/2017 1300  LEUKOCYTESUR NEGATIVE 11/17/2017 1300   Sepsis Labs Invalid input(s): PROCALCITONIN,  WBC,  LACTICIDVEN Microbiology No results found for this or any previous visit (from the past 240 hour(s)).   Time coordinating discharge: 45 minutes  SIGNED:   Coralie Keens, MD  [facility] 12/25/2017, 10:47 AM Pager [phone]  If 7PM-7AM, please contact night-coverage [email or web address] Password [email or web address]

## 2017-12-25 NOTE — Care Management (Signed)
   Patient Information   Patient Name Donna Graves, Donna Graves ([id number]) Sex Female DOB 1951/07/06  Room Bed  A327 A327-01  Patient Demographics   Address [address] [place] [address] Phone [phone] (Home) [phone] (Mobile) E-mail Address [email or web address]  Patient Ethnicity & Race   Ethnic Group Patient Race  Not Hispanic or Latino White or Caucasian  Emergency Contact(s)   Name Relation Home Work Mobile  [place] Spouse [phone]  [phone]  Documents on File    Status Date Received Description  Documents for the Patient  [place] HIPAA NOTICE OF PRIVACY - Scanned Not Received    [place] E-Signature HIPAA Notice of Privacy Received 10/12/15   Driver's License Not Received    Insurance Card Not Received  [facility] 09/2015  Advance Directives/Living Will/HCPOA/POA Not Received    Other Photo ID Not Received    [place] E-Signature HIPAA Notice of Privacy Spanish     Advanced Beneficiary Notice (ABN) Not Received    E-Signature AOB [place] Not Received    Insurance Card     HIM ROI Authorization  11/17/15   Insurance Card Received 03/09/17 HUMANA 2018  AMB Correspondence  03/16/17 05/18 SOAP NOTE [facility]  AMB Correspondence  11/29/16 OFFICE VISIT NOTE [facility]  AMB Outside Hospital Record  09/10/13 PACEMAKER IMPLANTATION [facility]  AMB Outside Hospital Record  11/29/16 CARDIOLOGY OFFICE VIST NOTE [facility]  AMB Outside Hospital Record  06/02/16 CARDIOLOGY OFFICE VISIT NOTE [facility]  Insurance Card Received 07/24/17 new MCare 2018/tg  AMB Correspondence Received 08/30/17 [facility]  AMB Correspondence Received 11/09/17 SOAP NOTE [facility]  AMB Correspondence (Deleted) 03/16/17 SOAP NOTE [facility]  Documents for the Encounter  AOB (Assignment of Insurance Benefits) Not Received    E-signature AOB Signed 12/05/17    MEDICARE RIGHTS Not Received    E-signature Medicare Rights Signed 12/05/17   ED Patient Billing Extract   ED PB Billing Extract  Cardiac Monitoring Strip Received 12/12/17   Cardiac Monitoring Strip Shift Summary Received 12/12/17   After Visit Summary   IP After Visit Summary  EKG Received 12/06/17   Admission Information   Attending Provider Admitting Provider Admission Type Admission Date/Time   Haydee Monica, MD Emergency 12/05/17 1723  Discharge Date Hospital Service Auth/Cert Status Service Area  12/25/17 Internal Medicine Incomplete [facility]  Unit Room/Bed Admission Status   AP-DEPT 300 A327/A327-01 Discharged (Confirmed)   Admission   Complaint  FLUID RETENTION  Hospital Account   Name Acct ID Class Status Primary Coverage  Donna Graves, Donna Graves [id number] Inpatient Discharged/Not Billed MEDICARE - MEDICARE PART A AND B      Guarantor Account (for Hospital Account [id number])   Name Relation to Pt Service Area Active? Acct Type  Donna Graves Self CHSA Yes Personal/Family  Address Phone    [address] [place], [place] [address] [phone](H)        Coverage Information (for Hospital Account [id number])   1. MEDICARE/MEDICARE PART A AND B   F/O Payor/Plan Precert #  MEDICARE/MEDICARE PART A AND B   Subscriber Subscriber #  Donna Graves, Donna Graves [id number]  Address Phone  [address] [place], [place] [address]   2. BLUE CROSS BLUE SHIELD/BCBS SUPPLEMENT   F/O Payor/Plan Precert #  BLUE CROSS BLUE SHIELD/BCBS SUPPLEMENT   Subscriber Subscriber #  Donna Graves, Donna Graves [id number]  Address Phone  [address] [place]

## 2017-12-25 NOTE — Care Management Important Message (Signed)
Important Message  Patient Details  Name: Donna Graves MRN: [id number] Date of Birth: 05-25-1951   Medicare Important Message Given:  Yes    Malcolm Metro, RN 12/25/2017, 12:33 PM

## 2018-01-01 ENCOUNTER — Encounter: Payer: Self-pay | Admitting: Physician Assistant

## 2018-01-01 ENCOUNTER — Telehealth: Payer: Self-pay

## 2018-01-01 ENCOUNTER — Other Ambulatory Visit (HOSPITAL_COMMUNITY)
Admission: RE | Admit: 2018-01-01 | Discharge: 2018-01-01 | Disposition: A | Discharge status: Home / Self Care | Payer: Medicare Other | Source: Ambulatory Visit | Attending: Physician Assistant | Admitting: Physician Assistant

## 2018-01-01 ENCOUNTER — Ambulatory Visit (INDEPENDENT_AMBULATORY_CARE_PROVIDER_SITE_OTHER): Payer: Medicare Other | Admitting: Physician Assistant

## 2018-01-01 VITALS — BP 100/64 | HR 73 | Ht 64.0 in | Wt 321.0 lb

## 2018-01-01 DIAGNOSIS — I5022 Chronic systolic (congestive) heart failure: Secondary | ICD-10-CM | POA: Diagnosis not present

## 2018-01-01 DIAGNOSIS — Z79899 Other long term (current) drug therapy: Secondary | ICD-10-CM | POA: Insufficient documentation

## 2018-01-01 DIAGNOSIS — E876 Hypokalemia: Secondary | ICD-10-CM

## 2018-01-01 LAB — BASIC METABOLIC PANEL
Anion gap: 15 (ref 5–15)
BUN: 25 mg/dL — AB (ref 6–20)
CHLORIDE: 84 mmol/L — AB (ref 101–111)
CO2: 35 mmol/L — AB (ref 22–32)
CREATININE: 0.93 mg/dL (ref 0.44–1.00)
Calcium: 9.3 mg/dL (ref 8.9–10.3)
GFR calc Af Amer: >60 mL/min (ref >60)
GFR calc non Af Amer: >60 mL/min (ref >60)
Glucose, Bld: 178 mg/dL — ABNORMAL HIGH (ref 65–99)
Potassium: 2.9 mmol/L — ABNORMAL LOW (ref 3.5–5.1)
Sodium: 134 mmol/L — ABNORMAL LOW (ref 135–145)

## 2018-01-01 NOTE — Telephone Encounter (Signed)
-----   Message from Darrol Jump, PA-C sent at 01/01/2018  4:28 PM EDT ----- Please let her know that her K+ is low. Cut the metolazone back to Monday and Thursday only Continue the Kdur at 3 tabs am and 2 tabs pm for now Recheck labs on Friday if can get results the same day. Keep f/u appt 05/01. Thanks

## 2018-01-01 NOTE — Progress Notes (Signed)
Cardiology Office Note   Date:  01/01/2018   ID:  Donna Graves, DOB [birth year], MRN [id number]  PCP:  Wilson Singer, MD  Cardiologist: Dr. Wyline Mood, 12/15/2017 in hospital Theodore Demark, PA-C    History of Present Illness: Donna Graves is a 67 y.o. female with a history of chronic combined systolic and diastolic CHF (EF 40% by most recent echo), nonischemic cardiomyopathy w/ non-obs dz at cath 2014 (s/p BiV ICD placement in 05/2017 - unable to have LV lead placed), PAT, HTN, HLD, Type 2 DM, and morbid obesity   Admitted 3/19- 12/25/2017 for CHF exacerbation, discharge weight 331 pounds.  Admit weight 339 pounds, so not much wt loss, but I/O net negative 38,567 ml.  Blood pressure limited up titration of meds, she was discharged on metolazone 2.5 mg daily and torsemide 80 mg twice daily. Potassium 40 mEq daily.  Sleep study scheduled for 01/04/2018  Donna Graves presents for cardiology follow up.  Her husband is doing the cooking, not adding salt. No prepared foods, except for some frozen foods that can be rinsed.  She is aware of being limited to 5- 8 oz cups daily, mostly water. She is compliant with her meds.   They had a MDT scale monitored by [facility], she was 318 on that scale this am.  He has been checking her weights daily.  She feels weak.  She gets tired and little short of breath walking room-room.  That is about all the walking she does.  Her exertion level is extremely limited.  She is not sleeping well. She wakes during the night, several times. She feels strange when she wakes, her husband says she is talking in her sleep or seeming agitated.   She has a tremor, that seems to be getting worse. Her husband feels this worsens when her potassium is low.  Dr Karilyn Cota did labs last week, said he would send them to [place]. He increased her Kdur from 40 meq qd to 100 meq qd.  She takes 3 tabs in the morning and 2 tabs in the afternoon.   Past Medical  History:  Diagnosis Date  . Atrial fibrillation and flutter (HCC)    History of RFA and ultimately AV node ablation  . Cardiomyopathy (HCC)    a. LVEF 25-30% by echo in 04/2017.  Marland Kitchen CHF (congestive heart failure) (HCC)   . Chronic systolic heart failure (HCC)   . Depression   . GERD (gastroesophageal reflux disease)   . Gout   . History of cardiac catheterization 07/23/2013   RA 17; RV 43/20 PCWP 27.  LAD normal  LCx  norma  RCA normal.  LVEF 55%  . Hyperlipidemia   . Hypertension   . Hypothyroidism   . 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 Ladona Ridgel  . Type 2 diabetes mellitus (HCC)     Past Surgical History:  Procedure Laterality Date  . ANAL FISSURE REPAIR    . BACK SURGERY    . BIV UPGRADE N/A 05/23/2017   Procedure: BiVI Upgrade;  Surgeon: Marinus Maw, MD;  Location: [facility] INVASIVE CV LAB;  Service: Cardiovascular;  Laterality: N/A;  . ICD IMPLANT  05/23/2017   biv  . Left toe surgery      Current Outpatient Medications  Medication Sig Dispense Refill  . acetaminophen (TYLENOL) 650 MG CR tablet Take 650-1,300 mg by mouth every 8 (eight) hours as needed for  pain.    . acidophilus (RISAQUAD) CAPS capsule Take 1 capsule by mouth daily. ULTIMATE FLORA PROBIOTIC    . ARMOUR THYROID 15 MG tablet Take 1 tablet by mouth daily.  2  . aspirin EC 81 MG EC tablet Take 1 tablet (81 mg total) by mouth daily.    . carvedilol (COREG) 3.125 MG tablet Take 1 tablet (3.125 mg total) by mouth 2 (two) times daily with a meal. 60 tablet 0  . Cholecalciferol (VITAMIN D PO) Take 7,000 Units by mouth daily.    Marland Kitchen dicyclomine (BENTYL) 10 MG capsule Take 10 mg by mouth 3 (three) times daily as needed for spasms.    Marland Kitchen glimepiride (AMARYL) 2 MG tablet Take 4 mg by mouth daily. BEFORE A MEAL    . lisinopril (PRINIVIL,ZESTRIL) 2.5 MG tablet Take 1 tablet (2.5 mg total) by mouth daily. 90 tablet 1  . magnesium oxide (MAG-OX) 400 MG  tablet Take 400 mg by mouth daily.    . metolazone (ZAROXOLYN) 2.5 MG tablet Take 1 tablet (2.5 mg total) by mouth daily. 30 tablet 0  . Multiple Vitamins-Minerals (ALIVE ONCE DAILY WOMENS 50+ PO) Take 1 tablet by mouth daily.    Marland Kitchen omeprazole (PRILOSEC) 20 MG capsule Take 20 mg by mouth every morning.    . polycarbophil (FIBERCON) 625 MG tablet Take 625 mg by mouth 4 (four) times daily.    . potassium chloride SA (K-DUR,KLOR-CON) 20 MEQ tablet Take 2 tablets (40 mEq total) by mouth daily. 60 tablet 0  . simvastatin (ZOCOR) 40 MG tablet Take 40 mg by mouth every evening.     Marland Kitchen spironolactone (ALDACTONE) 25 MG tablet Take 0.5 tablets (12.5 mg total) by mouth daily. 15 tablet 0  . torsemide (DEMADEX) 20 MG tablet Take 4 tablets (80 mg total) by mouth 2 (two) times daily. 240 tablet 0   No current facility-administered medications for this visit.     Allergies:   Codeine    Social History:  The patient  reports that she has never smoked. She has never used smokeless tobacco. She reports that she does not drink alcohol or use drugs.   Family History:  The patient's family history includes Arthritis (age of onset: 80) in her father; Diabetes (age of onset: 64) in her brother; Diabetes (age of onset: 56) in her mother; Heart attack in her mother; Hypertension in her mother.    ROS:  Please see the history of present illness. All other systems are reviewed and negative.    PHYSICAL EXAM: VS:  BP 100/64   Pulse 73   Ht 5\' 4"  (1.626 m)   Wt (!) 321 lb (145.6 kg)   SpO2 91% Comment: on room air  BMI 55.10 kg/m  , BMI Body mass index is 55.1 kg/m. GEN: Well nourished, well developed, female in no acute distress  HEENT: normal for age  Neck: JVD 9 cm, no carotid bruit, no masses Cardiac: RRR; no murmur, no rubs, or gallops Respiratory: rales bases bilaterally, normal work of breathing GI: soft, nontender, nondistended, + BS MS: no deformity or atrophy; 1-2+ edema; distal pulses are 2+ in  all 4 extremities   Skin: warm and dry, no rash, she had a blister in one area that has improved.  Her husband wonders what he should do about them. Neuro:  Strength and sensation are intact; tremor, seen in all 4 extremities Psych: euthymic mood, full affect   EKG:  EKG is not ordered today.  Echocardiogram: 10/17/2017  Study Conclusions - Left ventricle: The estimated ejection fraction was 25%. - Ventricular septum: Septal motion showed abnormal function and dyssynergy. - Mitral valve: Mildly calcified annulus. - Right ventricle: The cavity size was mildly dilated. Systolic function was moderately reduced. Impressions: - Limited study with Definity. Images are still quite limited. LVEF estimated at approximately 25% based on the most consistent views. Cannot specifically comment on focal wall motion although there does look to be septal dyssynergy. There is also moderate right ventricular dysfunction.  Remote device check: A & V paced 72.37% of the time RV paced 96% of the time  Recent Labs: 10/26/2017: ALT 23 12/05/2017: B Natriuretic Peptide 400.0 12/23/2017: Hemoglobin 15.3; Platelets 181 12/24/2017: Magnesium 1.8 12/25/2017: BUN 21; Creatinine, Ser 0.79; Potassium 3.3; Sodium 131    Lipid Panel No results found for: CHOL, TRIG, HDL, CHOLHDL, VLDL, LDLCALC, LDLDIRECT   Wt Readings from Last 3 Encounters:  01/01/18 (!) 321 lb (145.6 kg)  12/25/17 (!) 330 lb 14.6 oz (150.1 kg)  11/17/17 (!) 330 lb (149.7 kg)     Other studies Reviewed: Additional studies/ records that were reviewed today include: Office notes, hospital records and testing.  ASSESSMENT AND PLAN:  1.  Chronic combined systolic and diastolic CHF: She still has some volume overload by exam but her weight is down another 9 pounds.  She does not wear compression socks and I encouraged she and her husband to look and see if she thought any of them she could use.  I explained that the compression  socks would help the fluid not accumulate in her legs during the day. -Continue current medical therapy.  Check a BMET today.  Obtain labs from Dr. Patty Sermons office -Her renal function and electrolytes will need to be followed closely -The tremor that she has may be coming from an electrolyte imbalance. -Continue daily weights and low-sodium diet. -Explained that the blisters are coming from extra fluid and encouraged her to wear compression socks. -I explained that she still has some extra volume on board, but will wait until her labs are reviewed before the deciding on any med changes.  2.  Hypokalemia: May be contributing to her tremors.  She has been taking 100 mEq daily total for several days now, check a BMET  Current medicines are reviewed at length with the patient today.  The patient has concerns regarding medicines.  Concerns were addressed  The following changes have been made:  no change  Labs/ tests ordered today include:   Orders Placed This Encounter  Procedures  . Basic Metabolic Panel (BMET)     Disposition:   FU with Dr. Wyline Mood in 3 weeks  Signed, Theodore Demark, PA-C  01/01/2018 1:46 PM    [facility] Phone: [phone]; Fax: [phone]  This note was written with the assistance of speech recognition software. Please excuse any transcriptional errors.

## 2018-01-01 NOTE — Telephone Encounter (Signed)
Spoke with pt. Informed her of results. She voiced understanding. She will come to [facility] to get labs drawn on Friday.

## 2018-01-01 NOTE — Patient Instructions (Signed)
Medication Instructions:  Your physician recommends that you continue on your current medications as directed. Please refer to the Current Medication list given to you today.   Labwork:TODAY BMET   Testing/Procedures: NONE  Follow-Up: Your physician recommends that you schedule a follow-up appointment in: 3 WEEKS    Any Other Special Instructions Will Be Listed Below (If Applicable).     If you need a refill on your cardiac medications before your next appointment, please call your pharmacy.

## 2018-01-04 ENCOUNTER — Ambulatory Visit: Payer: Medicare Other | Attending: Internal Medicine | Admitting: Neurology

## 2018-01-04 ENCOUNTER — Telehealth: Payer: Self-pay | Admitting: Cardiology

## 2018-01-04 DIAGNOSIS — R0683 Snoring: Secondary | ICD-10-CM | POA: Insufficient documentation

## 2018-01-04 DIAGNOSIS — G4733 Obstructive sleep apnea (adult) (pediatric): Secondary | ICD-10-CM | POA: Insufficient documentation

## 2018-01-04 DIAGNOSIS — R5383 Other fatigue: Secondary | ICD-10-CM | POA: Diagnosis present

## 2018-01-04 NOTE — Telephone Encounter (Signed)
LMOVM reminding pt to send remote transmission.   

## 2018-01-07 NOTE — Procedures (Signed)
[facility] Karron Alvizo A. Gerilyn Pilgrim, MD     [email or web address]             NOCTURNAL POLYSOMNOGRAPHY   LOCATION: [facility]   Patient Name: Donna Graves, Donna Graves Date: 01/04/2018 Gender: Female D.O.B: [birth year] Age (years): 10 Referring Provider: Lilly Cove Height (inches): 64 Interpreting Physician: Beryle Beams MD, ABSM Weight (lbs): 321 RPSGT: Peak, Robert BMI: 55 MRN: [id number] Neck Size: 18.00 <br> <br> CLINICAL INFORMATION Sleep Study Type: NPSG    Indication for sleep study: Fatigue, Snoring    Epworth Sleepiness Score: 12    SLEEP STUDY TECHNIQUE As per the AASM Manual for the Scoring of Sleep and Associated Events v2.3 (April 2016) with a hypopnea requiring 4% desaturations.  The channels recorded and monitored were frontal, central and occipital EEG, electrooculogram (EOG), submentalis EMG (chin), nasal and oral airflow, thoracic and abdominal wall motion, anterior tibialis EMG, snore microphone, electrocardiogram, and pulse oximetry.  MEDICATIONS Medications self-administered by patient taken the night of the study : N/A  Current Outpatient Medications:  .  acetaminophen (TYLENOL) 650 MG CR tablet, Take 650-1,300 mg by mouth every 8 (eight) hours as needed for pain., Disp: , Rfl:  .  acidophilus (RISAQUAD) CAPS capsule, Take 1 capsule by mouth daily. ULTIMATE FLORA PROBIOTIC, Disp: , Rfl:  .  ARMOUR THYROID 15 MG tablet, Take 1 tablet by mouth daily., Disp: , Rfl: 2 .  aspirin EC 81 MG EC tablet, Take 1 tablet (81 mg total) by mouth daily., Disp: , Rfl:  .  carvedilol (COREG) 3.125 MG tablet, Take 1 tablet (3.125 mg total) by mouth 2 (two) times daily with a meal., Disp: 60 tablet, Rfl: 0 .  Cholecalciferol (VITAMIN D PO), Take 7,000 Units by mouth daily., Disp: , Rfl:  .  dicyclomine (BENTYL) 10 MG capsule, Take 10 mg by mouth 3 (three) times daily as needed for spasms., Disp: , Rfl:  .  glimepiride (AMARYL) 2 MG tablet, Take 4 mg by  mouth daily. BEFORE A MEAL, Disp: , Rfl:  .  lisinopril (PRINIVIL,ZESTRIL) 2.5 MG tablet, Take 1 tablet (2.5 mg total) by mouth daily., Disp: 90 tablet, Rfl: 1 .  magnesium oxide (MAG-OX) 400 MG tablet, Take 400 mg by mouth daily., Disp: , Rfl:  .  metolazone (ZAROXOLYN) 2.5 MG tablet, Take 1 tablet (2.5 mg total) by mouth daily., Disp: 30 tablet, Rfl: 0 .  Multiple Vitamins-Minerals (ALIVE ONCE DAILY WOMENS 50+ PO), Take 1 tablet by mouth daily., Disp: , Rfl:  .  omeprazole (PRILOSEC) 20 MG capsule, Take 20 mg by mouth every morning., Disp: , Rfl:  .  polycarbophil (FIBERCON) 625 MG tablet, Take 625 mg by mouth 4 (four) times daily., Disp: , Rfl:  .  potassium chloride SA (K-DUR,KLOR-CON) 20 MEQ tablet, Take 2 tablets (40 mEq total) by mouth daily., Disp: 60 tablet, Rfl: 0 .  simvastatin (ZOCOR) 40 MG tablet, Take 40 mg by mouth every evening. , Disp: , Rfl:  .  spironolactone (ALDACTONE) 25 MG tablet, Take 0.5 tablets (12.5 mg total) by mouth daily., Disp: 15 tablet, Rfl: 0 .  torsemide (DEMADEX) 20 MG tablet, Take 4 tablets (80 mg total) by mouth 2 (two) times daily., Disp: 240 tablet, Rfl: 0    SLEEP ARCHITECTURE The study was initiated at 9:32:21 PM and ended at 4:13:24 AM.  Sleep onset time was 0.1 minutes and the sleep efficiency was 61.1%%. The total sleep time was 244.9 minutes.  Stage REM latency was N/A minutes.  The patient spent  40.4%% of the night in stage N1 sleep, 59.4%% in stage N2 sleep, 0.2%% in stage N3 and 0.00% in REM.  Alpha intrusion was absent.  Supine sleep was 0.00%.  RESPIRATORY PARAMETERS The overall apnea/hypopnea index (AHI) was 50.7 per hour. There were 9 total apneas, including 7 obstructive, 2 central and 0 mixed apneas. There were 198 hypopneas and 7 RERAs.  The AHI during Stage REM sleep was N/A per hour.  AHI while supine was N/A per hour.  The mean oxygen saturation was 87.9%. The minimum SpO2 during sleep was 73.0%.  loud snoring was noted  during this study.  CARDIAC DATA The 2 lead EKG demonstrated sinus rhythm, pacemaker generated. The mean heart rate was 34.8 beats per minute. Other EKG findings include: PVCs. LEG MOVEMENT DATA The total PLMS were 0 with a resulting PLMS index of 0.0. Associated arousal with leg movement index was 0.0.    IMPRESSIONS - Severe obstructive sleep apnea is Documented during this recording. A formal CPAP titration study is recommended. -   Abnormal sleep architecture is documented with reduced sleep efficiency, fragmented sleep, reduced slow-wave sleep and absent REM sleep.    Argie Ramming, MD Diplomate, American Board of Sleep Medicine.  ELECTRONICALLY SIGNED ON:  01/07/2018, 11:16 PM [facility] PH: [phone]   FX: [phone] ACCREDITED BY THE AMERICAN ACADEMY OF SLEEP MEDICINE

## 2018-01-09 ENCOUNTER — Telehealth: Payer: Self-pay | Admitting: Cardiology

## 2018-01-09 ENCOUNTER — Telehealth: Payer: Self-pay | Admitting: *Deleted

## 2018-01-09 MED ORDER — METOLAZONE 2.5 MG PO TABS: ORAL_TABLET | ORAL | 0 refills | Status: AC

## 2018-01-09 NOTE — Telephone Encounter (Signed)
Patient called back again.

## 2018-01-09 NOTE — Telephone Encounter (Signed)
Asking to have [facility] draw her lab work

## 2018-01-09 NOTE — Telephone Encounter (Signed)
Spoke with Donna Graves at [facility]. She will call [facility] to see if pt has been seen today and have pt's blood drawn today. Donna Graves will call office with update.

## 2018-01-09 NOTE — Telephone Encounter (Signed)
Patient has been made aware to get the repeat BMET drawn today. She verbalized that she is taking her Metolazone on Mondays and Thursdays.   Notes recorded by Darrol Jump, PA-C on 01/01/2018 at 4:28 PM EDT Please let her know that her K+ is low. Cut the metolazone back to Monday and Thursday only Continue the Kdur at 3 tabs am and 2 tabs pm for now Recheck labs on Friday if can get results the same day. Keep f/u appt 05/01. Thanks

## 2018-01-09 NOTE — Telephone Encounter (Signed)
-----   Message from Darrol Jump, PA-C sent at 01/05/2018  8:28 AM EDT ----- Please make sure BMET gets done.   ----- Message ----- From: Interface, Lab In [place] Sent: 01/01/2018   2:17 PM To: Darrol Jump, PA-C

## 2018-01-09 NOTE — Telephone Encounter (Signed)
K Pinnix LPN spoke with pt, situation is being addressed

## 2018-01-12 ENCOUNTER — Other Ambulatory Visit: Payer: Self-pay | Admitting: *Deleted

## 2018-01-12 MED ORDER — TORSEMIDE 20 MG PO TABS: 80.0000 mg | ORAL_TABLET | Freq: Two times a day (BID) | ORAL | 0 refills | Status: DC

## 2018-01-12 NOTE — Telephone Encounter (Signed)
Per husband, torsemide on backorder at [facility]. New rx sent to [facility]

## 2018-01-17 ENCOUNTER — Encounter (HOSPITAL_COMMUNITY): Payer: Self-pay | Admitting: Emergency Medicine

## 2018-01-17 ENCOUNTER — Emergency Department (HOSPITAL_COMMUNITY): Payer: Medicare Other

## 2018-01-17 ENCOUNTER — Inpatient Hospital Stay (HOSPITAL_COMMUNITY)
Admission: EM | Admit: 2018-01-17 | Discharge: 2018-01-30 | DRG: 292 | Disposition: A | Discharge status: Home Health | Payer: Medicare Other | Attending: Internal Medicine | Admitting: Internal Medicine

## 2018-01-17 ENCOUNTER — Ambulatory Visit: Payer: Medicare Other | Admitting: Cardiology

## 2018-01-17 ENCOUNTER — Other Ambulatory Visit: Payer: Self-pay

## 2018-01-17 DIAGNOSIS — I959 Hypotension, unspecified: Secondary | ICD-10-CM | POA: Diagnosis not present

## 2018-01-17 DIAGNOSIS — I480 Paroxysmal atrial fibrillation: Secondary | ICD-10-CM | POA: Diagnosis present

## 2018-01-17 DIAGNOSIS — K219 Gastro-esophageal reflux disease without esophagitis: Secondary | ICD-10-CM | POA: Diagnosis present

## 2018-01-17 DIAGNOSIS — E785 Hyperlipidemia, unspecified: Secondary | ICD-10-CM | POA: Diagnosis present

## 2018-01-17 DIAGNOSIS — Z7982 Long term (current) use of aspirin: Secondary | ICD-10-CM | POA: Diagnosis not present

## 2018-01-17 DIAGNOSIS — E11649 Type 2 diabetes mellitus with hypoglycemia without coma: Secondary | ICD-10-CM | POA: Diagnosis not present

## 2018-01-17 DIAGNOSIS — Z8249 Family history of ischemic heart disease and other diseases of the circulatory system: Secondary | ICD-10-CM | POA: Diagnosis not present

## 2018-01-17 DIAGNOSIS — I2510 Atherosclerotic heart disease of native coronary artery without angina pectoris: Secondary | ICD-10-CM | POA: Diagnosis present

## 2018-01-17 DIAGNOSIS — I110 Hypertensive heart disease with heart failure: Principal | ICD-10-CM | POA: Diagnosis present

## 2018-01-17 DIAGNOSIS — I10 Essential (primary) hypertension: Secondary | ICD-10-CM | POA: Diagnosis present

## 2018-01-17 DIAGNOSIS — Z79890 Hormone replacement therapy (postmenopausal): Secondary | ICD-10-CM

## 2018-01-17 DIAGNOSIS — R0902 Hypoxemia: Secondary | ICD-10-CM | POA: Diagnosis present

## 2018-01-17 DIAGNOSIS — M109 Gout, unspecified: Secondary | ICD-10-CM | POA: Diagnosis present

## 2018-01-17 DIAGNOSIS — I2583 Coronary atherosclerosis due to lipid rich plaque: Secondary | ICD-10-CM | POA: Diagnosis not present

## 2018-01-17 DIAGNOSIS — Z7984 Long term (current) use of oral hypoglycemic drugs: Secondary | ICD-10-CM | POA: Diagnosis not present

## 2018-01-17 DIAGNOSIS — Z885 Allergy status to narcotic agent status: Secondary | ICD-10-CM

## 2018-01-17 DIAGNOSIS — F329 Major depressive disorder, single episode, unspecified: Secondary | ICD-10-CM | POA: Diagnosis present

## 2018-01-17 DIAGNOSIS — Z833 Family history of diabetes mellitus: Secondary | ICD-10-CM

## 2018-01-17 DIAGNOSIS — R001 Bradycardia, unspecified: Secondary | ICD-10-CM | POA: Diagnosis present

## 2018-01-17 DIAGNOSIS — I428 Other cardiomyopathies: Secondary | ICD-10-CM | POA: Diagnosis present

## 2018-01-17 DIAGNOSIS — I5023 Acute on chronic systolic (congestive) heart failure: Secondary | ICD-10-CM | POA: Diagnosis present

## 2018-01-17 DIAGNOSIS — E039 Hypothyroidism, unspecified: Secondary | ICD-10-CM | POA: Diagnosis present

## 2018-01-17 DIAGNOSIS — Z95810 Presence of automatic (implantable) cardiac defibrillator: Secondary | ICD-10-CM | POA: Diagnosis not present

## 2018-01-17 DIAGNOSIS — Z6841 Body Mass Index (BMI) 40.0 and over, adult: Secondary | ICD-10-CM

## 2018-01-17 DIAGNOSIS — I878 Other specified disorders of veins: Secondary | ICD-10-CM | POA: Diagnosis present

## 2018-01-17 DIAGNOSIS — E876 Hypokalemia: Secondary | ICD-10-CM | POA: Diagnosis present

## 2018-01-17 DIAGNOSIS — Z79899 Other long term (current) drug therapy: Secondary | ICD-10-CM

## 2018-01-17 DIAGNOSIS — E119 Type 2 diabetes mellitus without complications: Secondary | ICD-10-CM

## 2018-01-17 LAB — COMPREHENSIVE METABOLIC PANEL
ALK PHOS: 165 U/L — AB (ref 38–126)
ALT: 18 U/L (ref 14–54)
AST: 61 U/L — AB (ref 15–41)
Albumin: 3 g/dL — ABNORMAL LOW (ref 3.5–5.0)
Anion gap: 16 — ABNORMAL HIGH (ref 5–15)
BILIRUBIN TOTAL: 2.2 mg/dL — AB (ref 0.3–1.2)
BUN: 22 mg/dL — AB (ref 6–20)
CALCIUM: 9.5 mg/dL (ref 8.9–10.3)
CHLORIDE: 87 mmol/L — AB (ref 101–111)
CO2: 32 mmol/L (ref 22–32)
CREATININE: 1.05 mg/dL — AB (ref 0.44–1.00)
GFR, EST NON AFRICAN AMERICAN: 55 mL/min — AB (ref >60)
Glucose, Bld: 113 mg/dL — ABNORMAL HIGH (ref 65–99)
Potassium: 2.8 mmol/L — ABNORMAL LOW (ref 3.5–5.1)
Sodium: 135 mmol/L (ref 135–145)
Total Protein: 6.6 g/dL (ref 6.5–8.1)

## 2018-01-17 LAB — CBC WITH DIFFERENTIAL/PLATELET
BASOS PCT: 1 %
Basophils Absolute: 0 10*3/uL (ref 0.0–0.1)
EOS ABS: 0.1 10*3/uL (ref 0.0–0.7)
Eosinophils Relative: 1 %
HCT: 47.3 % — ABNORMAL HIGH (ref 36.0–46.0)
Hemoglobin: 15.3 g/dL — ABNORMAL HIGH (ref 12.0–15.0)
LYMPHS ABS: 1.3 10*3/uL (ref 0.7–4.0)
Lymphocytes Relative: 25 %
MCH: 29.8 pg (ref 26.0–34.0)
MCHC: 32.3 g/dL (ref 30.0–36.0)
MCV: 92 fL (ref 78.0–100.0)
Monocytes Absolute: 0.5 10*3/uL (ref 0.1–1.0)
Monocytes Relative: 9 %
NEUTROS PCT: 64 %
Neutro Abs: 3.2 10*3/uL (ref 1.7–7.7)
Platelets: 197 10*3/uL (ref 150–400)
RBC: 5.14 MIL/uL — AB (ref 3.87–5.11)
RDW: 17.2 % — ABNORMAL HIGH (ref 11.5–15.5)
WBC: 5 10*3/uL (ref 4.0–10.5)

## 2018-01-17 LAB — URINALYSIS, ROUTINE W REFLEX MICROSCOPIC
BILIRUBIN URINE: NEGATIVE
Glucose, UA: NEGATIVE mg/dL
HGB URINE DIPSTICK: NEGATIVE
Ketones, ur: NEGATIVE mg/dL
Leukocytes, UA: NEGATIVE
NITRITE: NEGATIVE
PROTEIN: NEGATIVE mg/dL
Specific Gravity, Urine: 1.005 (ref 1.005–1.030)
pH: 8 (ref 5.0–8.0)

## 2018-01-17 LAB — D-DIMER, QUANTITATIVE: D-Dimer, Quant: 1.63 ug/mL-FEU — ABNORMAL HIGH (ref 0.00–0.50)

## 2018-01-17 LAB — BLOOD GAS, ARTERIAL
ACID-BASE EXCESS: 9.5 mmol/L — AB (ref 0.0–2.0)
BICARBONATE: 33 mmol/L — AB (ref 20.0–28.0)
Drawn by: 317771
FIO2: 0.21
O2 Saturation: 94.9 %
PO2 ART: 78.1 mmHg — AB (ref 83.0–108.0)
pCO2 arterial: 41.3 mmHg (ref 32.0–48.0)
pH, Arterial: 7.515 — ABNORMAL HIGH (ref 7.350–7.450)

## 2018-01-17 LAB — MAGNESIUM: Magnesium: 2.3 mg/dL (ref 1.7–2.4)

## 2018-01-17 LAB — BRAIN NATRIURETIC PEPTIDE: B NATRIURETIC PEPTIDE 5: 402 pg/mL — AB (ref 0.0–100.0)

## 2018-01-17 MED ORDER — LISINOPRIL 5 MG PO TABS
2.5000 mg | ORAL_TABLET | Freq: Every day | ORAL | Status: DC
  Administered 2018-01-18: 2.5 mg via ORAL
  Filled 2018-01-17 (×2): qty 1

## 2018-01-17 MED ORDER — DICYCLOMINE HCL 10 MG PO CAPS: 10.0000 mg | ORAL_CAPSULE | Freq: Three times a day (TID) | ORAL | Status: DC | PRN

## 2018-01-17 MED ORDER — POTASSIUM CHLORIDE CRYS ER 20 MEQ PO TBCR
60.0000 meq | EXTENDED_RELEASE_TABLET | Freq: Once | ORAL | Status: AC
  Administered 2018-01-17: 60 meq via ORAL
  Filled 2018-01-17: qty 3

## 2018-01-17 MED ORDER — POTASSIUM CHLORIDE CRYS ER 20 MEQ PO TBCR
40.0000 meq | EXTENDED_RELEASE_TABLET | Freq: Two times a day (BID) | ORAL | Status: DC
  Administered 2018-01-17 – 2018-01-23 (×12): 40 meq via ORAL
  Filled 2018-01-17 (×13): qty 2

## 2018-01-17 MED ORDER — IOPAMIDOL (ISOVUE-370) INJECTION 76%
125.0000 mL | Freq: Once | INTRAVENOUS | Status: AC | PRN
  Administered 2018-01-17: 125 mL via INTRAVENOUS

## 2018-01-17 MED ORDER — CARVEDILOL 3.125 MG PO TABS
3.1250 mg | ORAL_TABLET | Freq: Two times a day (BID) | ORAL | Status: DC
  Administered 2018-01-17 – 2018-01-18 (×3): 3.125 mg via ORAL
  Filled 2018-01-17 (×3): qty 1

## 2018-01-17 MED ORDER — ONDANSETRON HCL 4 MG/2ML IJ SOLN
4.0000 mg | Freq: Four times a day (QID) | INTRAMUSCULAR | Status: DC | PRN
  Administered 2018-01-22 – 2018-01-27 (×4): 4 mg via INTRAVENOUS
  Filled 2018-01-17 (×5): qty 2

## 2018-01-17 MED ORDER — SPIRONOLACTONE 25 MG PO TABS
12.5000 mg | ORAL_TABLET | Freq: Every day | ORAL | Status: DC
  Administered 2018-01-18 – 2018-01-25 (×8): 12.5 mg via ORAL
  Filled 2018-01-17: qty 1
  Filled 2018-01-17: qty 0.5
  Filled 2018-01-17 (×4): qty 1
  Filled 2018-01-17 (×3): qty 0.5
  Filled 2018-01-17: qty 1
  Filled 2018-01-17 (×3): qty 0.5
  Filled 2018-01-17 (×4): qty 1

## 2018-01-17 MED ORDER — POTASSIUM CHLORIDE 10 MEQ/100ML IV SOLN
10.0000 meq | Freq: Once | INTRAVENOUS | Status: AC
  Administered 2018-01-17: 10 meq via INTRAVENOUS
  Filled 2018-01-17: qty 100

## 2018-01-17 MED ORDER — ASPIRIN 81 MG PO TBEC: 81.0000 mg | DELAYED_RELEASE_TABLET | Freq: Every day | ORAL | Status: DC

## 2018-01-17 MED ORDER — SODIUM CHLORIDE 0.9% FLUSH
3.0000 mL | Freq: Two times a day (BID) | INTRAVENOUS | Status: DC
  Administered 2018-01-17 – 2018-01-30 (×24): 3 mL via INTRAVENOUS

## 2018-01-17 MED ORDER — MAGNESIUM OXIDE 400 (241.3 MG) MG PO TABS
400.0000 mg | ORAL_TABLET | Freq: Every day | ORAL | Status: DC
  Administered 2018-01-18 – 2018-01-30 (×13): 400 mg via ORAL
  Filled 2018-01-17 (×13): qty 1

## 2018-01-17 MED ORDER — ACETAMINOPHEN 325 MG PO TABS
650.0000 mg | ORAL_TABLET | ORAL | Status: DC | PRN
  Administered 2018-01-18 – 2018-01-27 (×7): 650 mg via ORAL
  Filled 2018-01-17 (×7): qty 2

## 2018-01-17 MED ORDER — THYROID 30 MG PO TABS
15.0000 mg | ORAL_TABLET | Freq: Every day | ORAL | Status: DC
  Administered 2018-01-18 – 2018-01-30 (×13): 15 mg via ORAL
  Filled 2018-01-17 (×14): qty 1

## 2018-01-17 MED ORDER — CALCIUM POLYCARBOPHIL 625 MG PO TABS
625.0000 mg | ORAL_TABLET | Freq: Four times a day (QID) | ORAL | Status: DC
  Administered 2018-01-18 – 2018-01-30 (×49): 625 mg via ORAL
  Filled 2018-01-17 (×55): qty 1

## 2018-01-17 MED ORDER — SODIUM CHLORIDE 0.9% FLUSH
3.0000 mL | INTRAVENOUS | Status: DC | PRN
  Administered 2018-01-19 – 2018-01-25 (×2): 3 mL via INTRAVENOUS
  Filled 2018-01-17 (×2): qty 3

## 2018-01-17 MED ORDER — RISAQUAD PO CAPS
1.0000 | ORAL_CAPSULE | Freq: Every day | ORAL | Status: DC
Start: 2018-01-18 — End: 2018-01-30
  Administered 2018-01-18 – 2018-01-30 (×13): 1 via ORAL
  Filled 2018-01-17 (×13): qty 1

## 2018-01-17 MED ORDER — ASPIRIN EC 81 MG PO TBEC
81.0000 mg | DELAYED_RELEASE_TABLET | Freq: Every day | ORAL | Status: DC
  Administered 2018-01-17 – 2018-01-30 (×14): 81 mg via ORAL
  Filled 2018-01-17 (×14): qty 1

## 2018-01-17 MED ORDER — SIMVASTATIN 20 MG PO TABS
40.0000 mg | ORAL_TABLET | Freq: Every evening | ORAL | Status: DC
  Administered 2018-01-17 – 2018-01-29 (×12): 40 mg via ORAL
  Filled 2018-01-17 (×12): qty 2

## 2018-01-17 MED ORDER — INSULIN ASPART 100 UNIT/ML ~~LOC~~ SOLN
0.0000 [IU] | Freq: Three times a day (TID) | SUBCUTANEOUS | Status: DC
  Administered 2018-01-18 – 2018-01-19 (×3): 1 [IU] via SUBCUTANEOUS
  Administered 2018-01-19 – 2018-01-20 (×2): 2 [IU] via SUBCUTANEOUS
  Administered 2018-01-20: 1 [IU] via SUBCUTANEOUS
  Administered 2018-01-21: 2 [IU] via SUBCUTANEOUS
  Administered 2018-01-23 (×2): 1 [IU] via SUBCUTANEOUS
  Administered 2018-01-24 – 2018-01-26 (×4): 2 [IU] via SUBCUTANEOUS
  Administered 2018-01-26: 1 [IU] via SUBCUTANEOUS
  Administered 2018-01-27: 2 [IU] via SUBCUTANEOUS
  Administered 2018-01-28: 1 [IU] via SUBCUTANEOUS
  Administered 2018-01-28 – 2018-01-29 (×2): 2 [IU] via SUBCUTANEOUS
  Administered 2018-01-29: 1 [IU] via SUBCUTANEOUS
  Administered 2018-01-29: 2 [IU] via SUBCUTANEOUS

## 2018-01-17 MED ORDER — GLIMEPIRIDE 2 MG PO TABS
4.0000 mg | ORAL_TABLET | Freq: Every day | ORAL | Status: DC
  Administered 2018-01-18 – 2018-01-23 (×5): 4 mg via ORAL
  Filled 2018-01-17 (×5): qty 2
  Filled 2018-01-17: qty 1
  Filled 2018-01-17: qty 2
  Filled 2018-01-17: qty 1

## 2018-01-17 MED ORDER — FUROSEMIDE 10 MG/ML IJ SOLN
80.0000 mg | Freq: Two times a day (BID) | INTRAMUSCULAR | Status: DC
  Administered 2018-01-18 – 2018-01-26 (×16): 80 mg via INTRAVENOUS
  Filled 2018-01-17 (×18): qty 8

## 2018-01-17 MED ORDER — SODIUM CHLORIDE 0.9 % IV SOLN: 250.0000 mL | INTRAVENOUS | Status: DC | PRN

## 2018-01-17 MED ORDER — METOLAZONE 2.5 MG PO TABS
2.5000 mg | ORAL_TABLET | ORAL | Status: DC
  Administered 2018-01-18: 2.5 mg via ORAL
  Filled 2018-01-17: qty 1

## 2018-01-17 MED ORDER — FUROSEMIDE 10 MG/ML IJ SOLN
80.0000 mg | Freq: Once | INTRAMUSCULAR | Status: AC
  Administered 2018-01-17: 80 mg via INTRAVENOUS
  Filled 2018-01-17: qty 8

## 2018-01-17 NOTE — ED Provider Notes (Addendum)
[facility] EMERGENCY DEPARTMENT Provider Note   CSN: [id number] Arrival date & time: 01/17/18  1152     History   Chief Complaint Chief Complaint  Patient presents with  . Bradycardia    HPI Donna Graves is a 67 y.o. female.  Patient with shortness of breath progressively worsening since yesterday.  She also reports diffuse abdominal pain for approximately a week.  Her husband reports that her [employer] monitor measured pulse rate of 38 yesterday and 35 this morning.  Husband also reports 12 pound weight gain over the past 1 week.  Patient complains of generally not feeling well.  She states that shortness of breath "comes and goes. " Does not feel like congestive heart failure she does have a past.  Only denies dyspnea nothing makes symptoms better or worse.  No other associated symptoms  HPI  Past Medical History:  Diagnosis Date  . Atrial fibrillation and flutter (HCC)    History of RFA and ultimately AV node ablation  . Cardiomyopathy (HCC)    a. LVEF 25-30% by echo in 04/2017.  Marland Kitchen CHF (congestive heart failure) (HCC)   . Chronic systolic heart failure (HCC)   . Depression   . GERD (gastroesophageal reflux disease)   . Gout   . History of cardiac catheterization 07/23/2013   RA 17; RV 43/20 PCWP 27.  LAD normal  LCx  norma  RCA normal.  LVEF 55%  . Hyperlipidemia   . Hypertension   . Hypothyroidism   . 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 Ladona Ridgel  . Type 2 diabetes mellitus [facility])     Patient Active Problem List   Diagnosis Date Noted  . Demand ischemia (HCC) 12/11/2017  . Hyponatremia 12/11/2017  . Cardiomyopathy (HCC) 12/07/2017  . Anasarca   . Hypokalemia   . CHF (congestive heart failure) (HCC) 12/05/2017  . CHF exacerbation (HCC) 10/26/2017  . Panniculitis 10/26/2017  . Acute respiratory failure with hypoxia (HCC) 10/18/2017  . Atrial tachycardia (HCC) 10/18/2017  . Morbid  obesity with BMI of 50.0-59.9, adult (HCC) 10/18/2017  . Morbid obesity (HCC) 10/12/2017  . Acute on chronic congestive heart failure (HCC) 08/17/2017  . Chronic systolic heart failure (HCC) 05/23/2017  . Atypical chest pain   . Elevated troponin   . Acute on chronic systolic CHF (congestive heart failure) (HCC) 03/30/2017  . Near syncope 03/30/2017  . Type II diabetes mellitus (HCC) 03/30/2017  . AF (paroxysmal atrial fibrillation) (HCC) 03/30/2017  . Essential hypertension 03/30/2017  . CAD (coronary artery disease) 03/30/2017    Past Surgical History:  Procedure Laterality Date  . ANAL FISSURE REPAIR    . BACK SURGERY    . BIV UPGRADE N/A 05/23/2017   Procedure: BiVI Upgrade;  Surgeon: Marinus Maw, MD;  Location: [facility] INVASIVE CV LAB;  Service: Cardiovascular;  Laterality: N/A;  . ICD IMPLANT  05/23/2017   biv  . Left toe surgery       OB History    Gravida      Para      Term      Preterm      AB      Living  1     SAB      TAB      Ectopic      Multiple      Live Births               Home Medications  Prior to Admission medications   Medication Sig Start Date End Date Taking? Authorizing Provider  acetaminophen (TYLENOL) 650 MG CR tablet Take 650-1,300 mg by mouth every 8 (eight) hours as needed for pain.    [provider]  acidophilus (RISAQUAD) CAPS capsule Take 1 capsule by mouth daily. ULTIMATE FLORA PROBIOTIC    [provider]  ARMOUR THYROID 15 MG tablet Take 1 tablet by mouth daily. 10/05/17   [provider]  aspirin EC 81 MG EC tablet Take 1 tablet (81 mg total) by mouth daily. 10/31/17   Philip Aspen, Limmie Patricia, MD  carvedilol (COREG) 3.125 MG tablet Take 1 tablet (3.125 mg total) by mouth 2 (two) times daily with a meal. 12/25/17 01/24/18  Arrien, York Ram, MD  Cholecalciferol (VITAMIN D PO) Take 7,000 Units by mouth daily.    [provider]  dicyclomine (BENTYL) 10 MG capsule Take 10 mg by  mouth 3 (three) times daily as needed for spasms.    [provider]  glimepiride (AMARYL) 2 MG tablet Take 4 mg by mouth daily. BEFORE A MEAL    [provider]  lisinopril (PRINIVIL,ZESTRIL) 2.5 MG tablet Take 1 tablet (2.5 mg total) by mouth daily. 11/13/17 02/11/18  Antoine Poche, MD  magnesium oxide (MAG-OX) 400 MG tablet Take 400 mg by mouth daily.    [provider]  metolazone (ZAROXOLYN) 2.5 MG tablet Take one tablet on Monday and Thursdays. 01/09/18   Barrett, Joline Salt, PA-C  Multiple Vitamins-Minerals (ALIVE ONCE DAILY WOMENS 50+ PO) Take 1 tablet by mouth daily.    [provider]  omeprazole (PRILOSEC) 20 MG capsule Take 20 mg by mouth every morning.    [provider]  polycarbophil (FIBERCON) 625 MG tablet Take 625 mg by mouth 4 (four) times daily.    [provider]  potassium chloride SA (K-DUR,KLOR-CON) 20 MEQ tablet Take 2 tablets (40 mEq total) by mouth daily. 12/26/17 01/25/18  Arrien, York Ram, MD  simvastatin (ZOCOR) 40 MG tablet Take 40 mg by mouth every evening.     [provider]  spironolactone (ALDACTONE) 25 MG tablet Take 0.5 tablets (12.5 mg total) by mouth daily. 12/26/17 01/25/18  Arrien, York Ram, MD  torsemide (DEMADEX) 20 MG tablet Take 4 tablets (80 mg total) by mouth 2 (two) times daily. 01/12/18   Antoine Poche, MD    Family History Family History  Problem Relation Age of Onset  . Diabetes Mother 94  . Heart attack Mother   . Hypertension Mother   . Diabetes Brother 108  . Arthritis Father 89    Social History Social History   Tobacco Use  . Smoking status: Never Smoker  . Smokeless tobacco: Never Used  Substance Use Topics  . Alcohol use: No    Alcohol/week: 0.0 oz  . Drug use: No     Allergies   Codeine   Review of Systems Review of Systems  Constitutional: Positive for unexpected weight change.  Respiratory: Positive for shortness of breath.     Cardiovascular: Positive for leg swelling.  Gastrointestinal: Positive for abdominal pain.  Allergic/Immunologic: Positive for immunocompromised state.       Diabetic  All other systems reviewed and are negative.    Physical Exam Updated Vital Signs BP 133/79 (BP Location: Left Arm)   Pulse 76   Temp 98 F (36.7 C) (Oral)   Resp 18   Wt (!) 151.8 kg (334 lb 9.6 oz)   SpO2 90%  BMI 57.43 kg/m   Physical Exam  Constitutional: She is oriented to person, place, and time.  Acutely and chronically ill-appearing  HENT:  Head: Normocephalic and atraumatic.  Eyes: Pupils are equal, round, and reactive to light. Conjunctivae are normal.  Neck: Neck supple. No JVD present. No tracheal deviation present. No thyromegaly present.  Cardiovascular: Normal rate and regular rhythm.  No murmur heard. Pulmonary/Chest: Effort normal and breath sounds normal.  Abdominal: Soft. Bowel sounds are normal. She exhibits no distension. There is no tenderness.  Morbidly obese.  Mild diffuse tenderness  Musculoskeletal: Normal range of motion. She exhibits edema. She exhibits no tenderness.  1+ pretibial pitting edema bilaterally  Neurological: She is alert and oriented to person, place, and time. Coordination normal.  Skin: Skin is warm and dry. No rash noted.  Psychiatric: She has a normal mood and affect.  Nursing note and vitals reviewed.    ED Treatments / Results  Labs (all labs ordered are listed, but only abnormal results are displayed) Labs Reviewed  COMPREHENSIVE METABOLIC PANEL  CBC WITH DIFFERENTIAL/PLATELET  URINALYSIS, ROUTINE W REFLEX MICROSCOPIC  BRAIN NATRIURETIC PEPTIDE  D-DIMER, QUANTITATIVE (NOT AT [facility])  BLOOD GAS, ARTERIAL    EKG None ED ECG REPORT   Date: 01/17/2018  Rate: 70  Rhythm: Paced rhythm  QRS Axis: normal  Intervals: normal  ST/T Wave abnormalities: nonspecific T wave changes  Conduction Disutrbances:nonspecific intraventricular conduction delay   Narrative Interpretation:   Old EKG Reviewed: unchanged No significant change from 12/15/2017 I have personally reviewed the EKG tracing and agree with the computerized printout as noted.  Radiology No results found.  Procedures Procedures (including critical care time)  Medications Ordered in ED Medications - No data to display Results for orders placed or performed during the hospital encounter of 01/17/18  Comprehensive metabolic panel  Result Value Ref Range   Sodium 135 135 - 145 mmol/L   Potassium 2.8 (L) 3.5 - 5.1 mmol/L   Chloride 87 (L) 101 - 111 mmol/L   CO2 32 22 - 32 mmol/L   Glucose, Bld 113 (H) 65 - 99 mg/dL   BUN 22 (H) 6 - 20 mg/dL   Creatinine, Ser 4.09 (H) 0.44 - 1.00 mg/dL   Calcium 9.5 8.9 - 81.1 mg/dL   Total Protein 6.6 6.5 - 8.1 g/dL   Albumin 3.0 (L) 3.5 - 5.0 g/dL   AST 61 (H) 15 - 41 U/L   ALT 18 14 - 54 U/L   Alkaline Phosphatase 165 (H) 38 - 126 U/L   Total Bilirubin 2.2 (H) 0.3 - 1.2 mg/dL   GFR calc non Af Amer 55 (L) >60 mL/min   GFR calc Af Amer >60 >60 mL/min   Anion gap 16 (H) 5 - 15  CBC with Differential/Platelet  Result Value Ref Range   WBC 5.0 4.0 - 10.5 K/uL   RBC 5.14 (H) 3.87 - 5.11 MIL/uL   Hemoglobin 15.3 (H) 12.0 - 15.0 g/dL   HCT 91.4 (H) 78.2 - 95.6 %   MCV 92.0 78.0 - 100.0 fL   MCH 29.8 26.0 - 34.0 pg   MCHC 32.3 30.0 - 36.0 g/dL   RDW 21.3 (H) 08.6 - 57.8 %   Platelets 197 150 - 400 K/uL   Neutrophils Relative % 64 %   Neutro Abs 3.2 1.7 - 7.7 K/uL   Lymphocytes Relative 25 %   Lymphs Abs 1.3 0.7 - 4.0 K/uL   Monocytes Relative 9 %   Monocytes Absolute 0.5  0.1 - 1.0 K/uL   Eosinophils Relative 1 %   Eosinophils Absolute 0.1 0.0 - 0.7 K/uL   Basophils Relative 1 %   Basophils Absolute 0.0 0.0 - 0.1 K/uL  Urinalysis, Routine w reflex microscopic  Result Value Ref Range   Color, Urine STRAW (A) YELLOW   APPearance CLEAR CLEAR   Specific Gravity, Urine 1.005 1.005 - 1.030   pH 8.0 5.0 - 8.0   Glucose, UA NEGATIVE  NEGATIVE mg/dL   Hgb urine dipstick NEGATIVE NEGATIVE   Bilirubin Urine NEGATIVE NEGATIVE   Ketones, ur NEGATIVE NEGATIVE mg/dL   Protein, ur NEGATIVE NEGATIVE mg/dL   Nitrite NEGATIVE NEGATIVE   Leukocytes, UA NEGATIVE NEGATIVE  Brain natriuretic peptide  Result Value Ref Range   B Natriuretic Peptide 402.0 (H) 0.0 - 100.0 pg/mL  D-dimer, quantitative (not at [facility])  Result Value Ref Range   D-Dimer, Quant 1.63 (H) 0.00 - 0.50 ug/mL-FEU  Blood gas, arterial  Result Value Ref Range   FIO2 0.21    pH, Arterial 7.515 (H) 7.350 - 7.450   pCO2 arterial 41.3 32.0 - 48.0 mmHg   pO2, Arterial 78.1 (L) 83.0 - 108.0 mmHg   Bicarbonate 33.0 (H) 20.0 - 28.0 mmol/L   Acid-Base Excess 9.5 (H) 0.0 - 2.0 mmol/L   O2 Saturation 94.9 %   Collection site RIGHT RADIAL    Drawn by [id number]    Sample type ARTERIAL DRAW    Allens test (pass/fail) PASS PASS  Magnesium  Result Value Ref Range   Magnesium 2.3 1.7 - 2.4 mg/dL   Ct Angio Chest Pe W And/or Wo Contrast  Result Date: 01/17/2018 CLINICAL DATA:  Bradycardia, edema and weight gain. EXAM: CT ANGIOGRAPHY CHEST WITH CONTRAST TECHNIQUE: Multidetector CT imaging of the chest was performed using the standard protocol during bolus administration of intravenous contrast. Multiplanar CT image reconstructions and MIPs were obtained to evaluate the vascular anatomy. CONTRAST:  ISOVUE-370 IOPAMIDOL (ISOVUE-370) INJECTION 76% COMPARISON:  None. FINDINGS: Cardiovascular: No evidence of pulmonary embolism with somewhat limited opacification at the subsegmental level. The central pulmonary arteries are not dilated. The heart is significantly enlarged with enlargement all 4 chambers. The right atrium is particularly enlarged and there is reflux of contrast into a markedly distended intrahepatic IVC and hepatic veins consistent with elevated right heart pressures and right heart failure. No pericardial fluid is identified. The thoracic aorta is normal in caliber.  Mediastinum/Nodes: No enlarged mediastinal, hilar, or axillary lymph nodes. Thyroid gland, trachea, and esophagus demonstrate no significant findings. Lungs/Pleura: Lungs show pulmonary venous hypertension and potentially mild interstitial edema without overt airspace edema. There is a trace amount of right pleural fluid at the lung base. No pulmonary consolidation, pneumothorax or pulmonary masses identified. Upper Abdomen: No acute abnormality. Musculoskeletal: No chest wall abnormality. No acute or significant osseous findings. Review of the MIP images confirms the above findings. IMPRESSION: 1. No evidence of pulmonary embolism. 2. Significant cardiac enlargement and evidence of elevated right heart pressures and right heart failure with marked dilatation of the right atrium and reflux into markedly distended intrahepatic IVC and hepatic veins. 3. Pulmonary venous hypertensive changes in the lungs with potential mild interstitial edema. No overt airspace edema. Trace right pleural fluid. Electronically Signed   By: Irish Lack M.D.   On: 01/17/2018 16:46   Dg Chest Port 1 View  Result Date: 01/17/2018 CLINICAL DATA:  Shortness of breath EXAM: PORTABLE CHEST 1 VIEW COMPARISON:  12/05/2017 FINDINGS: 1253 hours. Low volume film.  The cardio pericardial silhouette is enlarged. There is pulmonary vascular congestion without overt pulmonary edema. The visualized bony structures of the thorax are intact. Telemetry leads overlie the chest. Left-sided pacer/AICD again noted. IMPRESSION: Cardiomegaly with vascular congestion.  Stable exam. Electronically Signed   By: Kennith Center M.D.   On: 01/17/2018 13:05    Initial Impression / Assessment and Plan / ED Course  I have reviewed the triage vital signs and the nursing notes.  Pertinent labs & imaging results that were available during my care of the patient were reviewed by me and considered in my medical decision making (see chart for details).      Pacemaker was interrogated.  I spoke with [employer] representative who states the patient has had no events.  Pacemaker is functioning normally. Exam and CT scan consistent with congestive heart failure, likely secondary to fluid overload.  Lab work consistent with hypokalemia which is chronic.  Serum magnesium normal.  CT angiogram has ruled out pulmonary aneurysm.  IV Lasix as well as intravenous and oral potassium supplementation.  I consulted Dr. Onalee Hua will arrange for overnight stay Final Clinical Impressions(s) / ED Diagnoses  Dx is #1 acute on chronic congestive heart failure Final diagnoses:  None  #2 hypokalemia  ED Discharge Orders    None       Doug Sou, MD 01/17/18 1836 CRITICAL CARE Performed by: Doug Sou Total critical care time: 30 minutes Critical care time was exclusive of separately billable procedures and treating other patients. Critical care was necessary to treat or prevent imminent or life-threatening deterioration. Critical care was time spent personally by me on the following activities: development of treatment plan with patient and/or surrogate as well as nursing, discussions with consultants, evaluation of patient's response to treatment, examination of patient, obtaining history from patient or surrogate, ordering and performing treatments and interventions, ordering and review of laboratory studies, ordering and review of radiographic studies, pulse oximetry and re-evaluation of patient's condition.   Doug Sou, MD 01/27/18 [phone]

## 2018-01-17 NOTE — ED Notes (Signed)
Pt to bedside commode to use the restroom.

## 2018-01-17 NOTE — ED Notes (Signed)
Assisted pt to bathroom with wheelchair. Pt independently toileting.

## 2018-01-17 NOTE — Progress Notes (Signed)
Patient currently refusing TED hose and SCD's. States that she will wear TED hose during day tomorrow as they make her legs cramp at night time. Educated patient on use of TED hose and SCD's, will inform oncoming RN on 5/2 of need of TED placement.

## 2018-01-17 NOTE — ED Triage Notes (Signed)
Pt comes in with bradycardia for 1 week intermittently. Has had 12lb weight gain in last week, 3lb weight gain over night. BLE and abd edema noted. Redness noted to abd with pain. Denies CP at this time, but reports indigestion yesterday.

## 2018-01-17 NOTE — H&P (Signed)
History and Physical    MARRY MERRIMAN ZPH:[id number] DOB: May 06, 1951 DOA: 01/17/2018  PCP: Wilson Singer, MD  Patient coming from: Home  Chief Complaint: Gaining weight and short of breath  HPI: Donna Graves is a 67 y.o. female with medical history significant of systolic congestive heart failure with last EF of 25%, morbid obesity, A. fib, pacemaker, diabetes, hypertension, hyperlipidemia comes into the emergency department after 2 weeks of weight gain over 12 pounds.  Patient reports swelling in her legs and her pannus.  She denies any fevers.  She reports that she complies with diet restrictions and salt intake.  She denies any nausea vomiting diarrhea.  Patient is referred for admission for acute congestive heart failure with fluid overload.  Her O2 sats are normal.  She denies any chest pain.  Review of Systems: As per HPI otherwise 10 point review of systems negative.   Past Medical History:  Diagnosis Date  . Atrial fibrillation and flutter (HCC)    History of RFA and ultimately AV node ablation  . Cardiomyopathy (HCC)    a. LVEF 25-30% by echo in 04/2017.  Marland Kitchen CHF (congestive heart failure) (HCC)   . Chronic systolic heart failure (HCC)   . Depression   . GERD (gastroesophageal reflux disease)   . Gout   . History of cardiac catheterization 07/23/2013   RA 17; RV 43/20 PCWP 27.  LAD normal  LCx  norma  RCA normal.  LVEF 55%  . Hyperlipidemia   . Hypertension   . Hypothyroidism   . 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 Ladona Ridgel  . Type 2 diabetes mellitus (HCC)     Past Surgical History:  Procedure Laterality Date  . ANAL FISSURE REPAIR    . BACK SURGERY    . BIV UPGRADE N/A 05/23/2017   Procedure: BiVI Upgrade;  Surgeon: Marinus Maw, MD;  Location: [facility] INVASIVE CV LAB;  Service: Cardiovascular;  Laterality: N/A;  . ICD IMPLANT  05/23/2017   biv  . Left toe surgery       reports that  she has never smoked. She has never used smokeless tobacco. She reports that she does not drink alcohol or use drugs.  Allergies  Allergen Reactions  . Codeine Rash    Family History  Problem Relation Age of Onset  . Diabetes Mother 69  . Heart attack Mother   . Hypertension Mother   . Diabetes Brother 61  . Arthritis Father 93    Prior to Admission medications   Medication Sig Start Date End Date Taking? Authorizing Provider  acetaminophen (TYLENOL) 650 MG CR tablet Take 650-1,300 mg by mouth every 8 (eight) hours as needed for pain.   Yes [provider]  acidophilus (RISAQUAD) CAPS capsule Take 1 capsule by mouth daily. ULTIMATE FLORA PROBIOTIC   Yes [provider]  ARMOUR THYROID 15 MG tablet Take 1 tablet by mouth daily. 10/05/17  Yes [provider]  aspirin EC 81 MG EC tablet Take 1 tablet (81 mg total) by mouth daily. 10/31/17  Yes Philip Aspen, Limmie Patricia, MD  carvedilol (COREG) 3.125 MG tablet Take 1 tablet (3.125 mg total) by mouth 2 (two) times daily with a meal. 12/25/17 01/24/18 Yes Arrien, York Ram, MD  Cholecalciferol (VITAMIN D PO) Take 7,000 Units by mouth daily.   Yes [provider]  dicyclomine (BENTYL) 10 MG capsule Take 10 mg by mouth 3 (three) times daily as  needed for spasms.   Yes [provider]  escitalopram (LEXAPRO) 5 MG tablet Take 1 tablet by mouth daily. 01/02/18  Yes [provider]  glimepiride (AMARYL) 2 MG tablet Take 4 mg by mouth daily. BEFORE A MEAL   Yes [provider]  lisinopril (PRINIVIL,ZESTRIL) 2.5 MG tablet Take 1 tablet (2.5 mg total) by mouth daily. 11/13/17 02/11/18 Yes BranchDorothe Pea, MD  magnesium oxide (MAG-OX) 400 MG tablet Take 400 mg by mouth daily.   Yes [provider]  metolazone (ZAROXOLYN) 2.5 MG tablet Take one tablet on Monday and Thursdays. 01/09/18  Yes Barrett, Joline Salt, PA-C  Multiple Vitamins-Minerals (ALIVE ONCE DAILY WOMENS 50+ PO) Take 1  tablet by mouth daily.   Yes [provider]  omeprazole (PRILOSEC) 20 MG capsule Take 20 mg by mouth every morning.   Yes [provider]  polycarbophil (FIBERCON) 625 MG tablet Take 625 mg by mouth 4 (four) times daily.   Yes [provider]  potassium chloride SA (K-DUR,KLOR-CON) 20 MEQ tablet Take 2 tablets (40 mEq total) by mouth daily. 12/26/17 01/25/18 Yes Arrien, York Ram, MD  simvastatin (ZOCOR) 40 MG tablet Take 40 mg by mouth every evening.    Yes [provider]  spironolactone (ALDACTONE) 25 MG tablet Take 0.5 tablets (12.5 mg total) by mouth daily. 12/26/17 01/25/18 Yes Arrien, York Ram, MD  torsemide (DEMADEX) 20 MG tablet Take 4 tablets (80 mg total) by mouth 2 (two) times daily. 01/12/18  Yes Antoine Poche, MD    Physical Exam: Vitals:   01/17/18 1419 01/17/18 1649 01/17/18 1700 01/17/18 1800  BP: 108/64 127/80 (!) 108/59 113/66  Pulse: 73 73 (!) 58 64  Resp: 18 (!) 21 (!) 22 18  Temp:      TempSrc:      SpO2: 91% 93% 92% 95%  Weight:          Constitutional: NAD, calm, comfortable and eating a brownie Vitals:   01/17/18 1419 01/17/18 1649 01/17/18 1700 01/17/18 1800  BP: 108/64 127/80 (!) 108/59 113/66  Pulse: 73 73 (!) 58 64  Resp: 18 (!) 21 (!) 22 18  Temp:      TempSrc:      SpO2: 91% 93% 92% 95%  Weight:       Eyes: PERRL, lids and conjunctivae normal ENMT: Mucous membranes are moist. Posterior pharynx clear of any exudate or lesions.Normal dentition.  Neck: normal, supple, no masses, no thyromegaly Respiratory: clear to auscultation bilaterally, no wheezing, no crackles. Normal respiratory effort. No accessory muscle use.  Cardiovascular: Regular rate and rhythm, no murmurs / rubs / gallops.  3+ extremity edema. 2+ pedal pulses. No carotid bruits.  Abdomen: no tenderness, no masses palpated. No hepatosplenomegaly. Bowel sounds positive.  Pannus with dependent edema Musculoskeletal: no clubbing / cyanosis. No  joint deformity upper and lower extremities. Good ROM, no contractures. Normal muscle tone.  Skin: no rashes, lesions, ulcers. No induration Neurologic: CN 2-12 grossly intact. Sensation intact, DTR normal. Strength 5/5 in all 4.  Psychiatric: Normal judgment and insight. Alert and oriented x 3. Normal mood.    Labs on Admission: I have personally reviewed following labs and imaging studies  CBC: Recent Labs  Lab 01/17/18 1320  WBC 5.0  NEUTROABS 3.2  HGB 15.3*  HCT 47.3*  MCV 92.0  PLT 197   Basic Metabolic Panel: Recent Labs  Lab 01/17/18 1320 01/17/18 1506  NA 135  --   K 2.8*  --  CL 87*  --   CO2 32  --   GLUCOSE 113*  --   BUN 22*  --   CREATININE 1.05*  --   CALCIUM 9.5  --   MG  --  2.3   GFR: Estimated Creatinine Clearance: 78.8 mL/min (A) (by C-G formula based on SCr of 1.05 mg/dL (H)). Liver Function Tests: Recent Labs  Lab 01/17/18 1320  AST 61*  ALT 18  ALKPHOS 165*  BILITOT 2.2*  PROT 6.6  ALBUMIN 3.0*   No results for input(s): LIPASE, AMYLASE in the last 168 hours. No results for input(s): AMMONIA in the last 168 hours. Coagulation Profile: No results for input(s): INR, PROTIME in the last 168 hours. Cardiac Enzymes: No results for input(s): CKTOTAL, CKMB, CKMBINDEX, TROPONINI in the last 168 hours. BNP (last 3 results) No results for input(s): PROBNP in the last 8760 hours. HbA1C: No results for input(s): HGBA1C in the last 72 hours. CBG: No results for input(s): GLUCAP in the last 168 hours. Lipid Profile: No results for input(s): CHOL, HDL, LDLCALC, TRIG, CHOLHDL, LDLDIRECT in the last 72 hours. Thyroid Function Tests: No results for input(s): TSH, T4TOTAL, FREET4, T3FREE, THYROIDAB in the last 72 hours. Anemia Panel: No results for input(s): VITAMINB12, FOLATE, FERRITIN, TIBC, IRON, RETICCTPCT in the last 72 hours. Urine analysis:    Component Value Date/Time   COLORURINE STRAW (A) 01/17/2018 1239   APPEARANCEUR CLEAR  01/17/2018 1239   LABSPEC 1.005 01/17/2018 1239   PHURINE 8.0 01/17/2018 1239   GLUCOSEU NEGATIVE 01/17/2018 1239   HGBUR NEGATIVE 01/17/2018 1239   BILIRUBINUR NEGATIVE 01/17/2018 1239   KETONESUR NEGATIVE 01/17/2018 1239   PROTEINUR NEGATIVE 01/17/2018 1239   NITRITE NEGATIVE 01/17/2018 1239   LEUKOCYTESUR NEGATIVE 01/17/2018 1239   Sepsis Labs: !!!!!!!!!!!!!!!!!!!!!!!!!!!!!!!!!!!!!!!!!!!! @LABRCNTIP (procalcitonin:4,lacticidven:4) )No results found for this or any previous visit (from the past 240 hour(s)).   Radiological Exams on Admission: Ct Angio Chest Pe W And/or Wo Contrast  Result Date: 01/17/2018 CLINICAL DATA:  Bradycardia, edema and weight gain. EXAM: CT ANGIOGRAPHY CHEST WITH CONTRAST TECHNIQUE: Multidetector CT imaging of the chest was performed using the standard protocol during bolus administration of intravenous contrast. Multiplanar CT image reconstructions and MIPs were obtained to evaluate the vascular anatomy. CONTRAST:  ISOVUE-370 IOPAMIDOL (ISOVUE-370) INJECTION 76% COMPARISON:  None. FINDINGS: Cardiovascular: No evidence of pulmonary embolism with somewhat limited opacification at the subsegmental level. The central pulmonary arteries are not dilated. The heart is significantly enlarged with enlargement all 4 chambers. The right atrium is particularly enlarged and there is reflux of contrast into a markedly distended intrahepatic IVC and hepatic veins consistent with elevated right heart pressures and right heart failure. No pericardial fluid is identified. The thoracic aorta is normal in caliber. Mediastinum/Nodes: No enlarged mediastinal, hilar, or axillary lymph nodes. Thyroid gland, trachea, and esophagus demonstrate no significant findings. Lungs/Pleura: Lungs show pulmonary venous hypertension and potentially mild interstitial edema without overt airspace edema. There is a trace amount of right pleural fluid at the lung base. No pulmonary consolidation,  pneumothorax or pulmonary masses identified. Upper Abdomen: No acute abnormality. Musculoskeletal: No chest wall abnormality. No acute or significant osseous findings. Review of the MIP images confirms the above findings. IMPRESSION: 1. No evidence of pulmonary embolism. 2. Significant cardiac enlargement and evidence of elevated right heart pressures and right heart failure with marked dilatation of the right atrium and reflux into markedly distended intrahepatic IVC and hepatic veins. 3. Pulmonary venous hypertensive changes in the lungs with potential  mild interstitial edema. No overt airspace edema. Trace right pleural fluid. Electronically Signed   By: Irish Lack M.D.   On: 01/17/2018 16:46   Dg Chest Port 1 View  Result Date: 01/17/2018 CLINICAL DATA:  Shortness of breath EXAM: PORTABLE CHEST 1 VIEW COMPARISON:  12/05/2017 FINDINGS: 1253 hours. Low volume film. The cardio pericardial silhouette is enlarged. There is pulmonary vascular congestion without overt pulmonary edema. The visualized bony structures of the thorax are intact. Telemetry leads overlie the chest. Left-sided pacer/AICD again noted. IMPRESSION: Cardiomegaly with vascular congestion.  Stable exam. Electronically Signed   By: Kennith Center M.D.   On: 01/17/2018 13:05    EKG: Independently reviewed.  Paced rhythm  Old chart reviewed Case discussed with EDP  Chest x-ray reviewed with edema no infiltrate  Assessment/Plan 67 year old female with acute on chronic systolic congestive heart failure exacerbation Principal Problem:   Acute on chronic systolic CHF (congestive heart failure) (HCC)-increase Zaroxolyn from several times a day week to daily, increase Lasix to 80 mg every 12 hours.  Patient had a cardiac echo in January of this year will not repeat at this time.  Clinically treat and follow.  Daily weights accurate I's and O's low-sodium diet.   Active Problems:   Type II diabetes mellitus (HCC)-sliding scale  insulin   AF (paroxysmal atrial fibrillation) (HCC)-currently rate controlled   Essential hypertension-continue home meds   CAD (coronary artery disease)-stable   Morbid obesity with BMI of 50.0-59.9, adult (HCC)-noted   Hypokalemia-replete orally and is chronic   Pacemaker was interrogated in the emergency department which was unrevealing   DVT prophylaxis: SCDs Code Status: Full Family Communication:  husband Disposition Plan: Per day team Consults called: None Admission status: Admission   Quanita Barona A MD [facility]  If 7PM-7AM, please contact night-coverage [email or web address] Password [email or web address]  01/17/2018, 7:05 PM

## 2018-01-17 NOTE — ED Notes (Signed)
Pt to CT

## 2018-01-18 LAB — BASIC METABOLIC PANEL
ANION GAP: 17 — AB (ref 5–15)
BUN: 20 mg/dL (ref 6–20)
CHLORIDE: 92 mmol/L — AB (ref 101–111)
CO2: 26 mmol/L (ref 22–32)
Calcium: 9.3 mg/dL (ref 8.9–10.3)
Creatinine, Ser: 0.94 mg/dL (ref 0.44–1.00)
Glucose, Bld: 73 mg/dL (ref 65–99)
Potassium: 3.6 mmol/L (ref 3.5–5.1)
SODIUM: 135 mmol/L (ref 135–145)

## 2018-01-18 LAB — GLUCOSE, CAPILLARY
GLUCOSE-CAPILLARY: 169 mg/dL — AB (ref 65–99)
GLUCOSE-CAPILLARY: 82 mg/dL (ref 65–99)
GLUCOSE-CAPILLARY: 147 mg/dL — AB (ref 65–99)
Glucose-Capillary: 148 mg/dL — ABNORMAL HIGH (ref 65–99)
Glucose-Capillary: 80 mg/dL (ref 65–99)

## 2018-01-18 MED ORDER — CARVEDILOL 3.125 MG PO TABS
6.2500 mg | ORAL_TABLET | Freq: Two times a day (BID) | ORAL | Status: DC
Start: 2018-01-19 — End: 2018-01-20
  Administered 2018-01-19 – 2018-01-20 (×3): 6.25 mg via ORAL
  Filled 2018-01-18 (×3): qty 2

## 2018-01-18 NOTE — Progress Notes (Signed)
PROGRESS NOTE    Donna Graves  ZOX:[id number] DOB: 10-17-1950 DOA: 01/17/2018 PCP: Donna Singer, MD     Brief Narrative:  67 year old woman admitted from home on 5/1 due to shortness of breath, well-known to [place] for frequent admissions due to congestive heart failure, admitted for same.   Assessment & Plan:   Principal Problem:   Acute on chronic systolic CHF (congestive heart failure) (HCC) Active Problems:   Type II diabetes mellitus (HCC)   AF (paroxysmal atrial fibrillation) (HCC)   Essential hypertension   CAD (coronary artery disease)   Morbid obesity with BMI of 50.0-59.9, adult (HCC)   Hypokalemia   Acute on chronic systolic heart failure -2D echo from January 2019 shows an ejection fraction of 25%. -She is symptomatically improved and has diuresed about 1.8 L since admission. -We will continue current Lasix dose of 80 mg IV twice daily. -She is also on Zaroxolyn 2.5 mg daily which we will continue for now. -She has had multiple hospitalizations for acute heart failure. -She is on low-dose of carvedilol 3.125 mg twice daily, will start titrating to maximally tolerated dose and aim for 25 mg twice daily (up to 6.25 mg BID on 5/2).  Once she is on maximally tolerated beta-blocker, can consider transitioning ACE inhibitor to ARNI as long as renal function permits. -She is also on spironolactone 12.5 mg daily. -I reviewed heart failure diet with patient and patient's husband, I believe that she is adhering to this and she also states that she is compliant with her medications.   Atrial fibrillation -Rate controlled, does not appear to be chronically anticoagulated.  Morbid obesity -Noted, counseled on diet and exercise.  Hypokalemia -Likely related to diuresis -Adequately replaced, magnesium was normal at 2.3  Essential hypertension -Well-controlled, of note Coreg has been increased.   DVT prophylaxis: SCDs Code Status: Full code Family Communication:  Husband at bedside updated on plan of care and all questions answered Disposition Plan: Anticipate discharge home once adequate rest, suspect at least 48 to 72 hours.  Consultants:   None  Procedures:   None  Antimicrobials:  Anti-infectives (From admission, onward)   None       Subjective: Feels well, no complaints, denies chest pain or shortness of breath  Objective: Vitals:   01/17/18 2153 01/18/18 0300 01/18/18 0426 01/18/18 1425  BP: (!) 130/50  (!) 98/45 124/84  Pulse: 69  69 70  Resp: 20   18  Temp: 98.2 F (36.8 C)  97.9 F (36.6 C) 97.8 F (36.6 C)  TempSrc: Oral  Oral Oral  SpO2: 96%  99% 99%  Weight: (!) 147.9 kg (326 lb 1 oz) (!) 148.4 kg (327 lb 2.6 oz)    Height: 5\' 4"  (1.626 m)       Intake/Output Summary (Last 24 hours) at 01/18/2018 1751 Last data filed at 01/18/2018 1500 Gross per 24 hour  Intake 723 ml  Output 2550 ml  Net -1827 ml   Filed Weights   01/17/18 1206 01/17/18 2153 01/18/18 0300  Weight: (!) 151.8 kg (334 lb 9.6 oz) (!) 147.9 kg (326 lb 1 oz) (!) 148.4 kg (327 lb 2.6 oz)    Examination:  General exam: Alert, awake, oriented x 3 Respiratory system: Clear to auscultation. Respiratory effort normal. Cardiovascular system:RRR. No murmurs, rubs, gallops. Gastrointestinal system: Abdomen is nondistended, soft and nontender. No organomegaly or masses felt. Normal bowel sounds heard. Central nervous system: Alert and oriented. No focal neurological deficits. Extremities: 3++ pitting edema  bilaterally up to knees Skin: No rashes, lesions or ulcers Psychiatry: Judgement and insight appear normal. Mood & affect appropriate.     Data Reviewed: I have personally reviewed following labs and imaging studies  CBC: Recent Labs  Lab 01/17/18 1320  WBC 5.0  NEUTROABS 3.2  HGB 15.3*  HCT 47.3*  MCV 92.0  PLT 197   Basic Metabolic Panel: Recent Labs  Lab 01/17/18 1320 01/17/18 1506 01/18/18 0604  NA 135  --  135  K 2.8*  --  3.6    CL 87*  --  92*  CO2 32  --  26  GLUCOSE 113*  --  73  BUN 22*  --  20  CREATININE 1.05*  --  0.94  CALCIUM 9.5  --  9.3  MG  --  2.3  --    GFR: Estimated Creatinine Clearance: 86.8 mL/min (by C-G formula based on SCr of 0.94 mg/dL). Liver Function Tests: Recent Labs  Lab 01/17/18 1320  AST 61*  ALT 18  ALKPHOS 165*  BILITOT 2.2*  PROT 6.6  ALBUMIN 3.0*   No results for input(s): LIPASE, AMYLASE in the last 168 hours. No results for input(s): AMMONIA in the last 168 hours. Coagulation Profile: No results for input(s): INR, PROTIME in the last 168 hours. Cardiac Enzymes: No results for input(s): CKTOTAL, CKMB, CKMBINDEX, TROPONINI in the last 168 hours. BNP (last 3 results) No results for input(s): PROBNP in the last 8760 hours. HbA1C: No results for input(s): HGBA1C in the last 72 hours. CBG: Recent Labs  Lab 01/17/18 2139 01/18/18 0735 01/18/18 1135 01/18/18 1620  GLUCAP 169* 82 147* 148*   Lipid Profile: No results for input(s): CHOL, HDL, LDLCALC, TRIG, CHOLHDL, LDLDIRECT in the last 72 hours. Thyroid Function Tests: No results for input(s): TSH, T4TOTAL, FREET4, T3FREE, THYROIDAB in the last 72 hours. Anemia Panel: No results for input(s): VITAMINB12, FOLATE, FERRITIN, TIBC, IRON, RETICCTPCT in the last 72 hours. Urine analysis:    Component Value Date/Time   COLORURINE STRAW (A) 01/17/2018 1239   APPEARANCEUR CLEAR 01/17/2018 1239   LABSPEC 1.005 01/17/2018 1239   PHURINE 8.0 01/17/2018 1239   GLUCOSEU NEGATIVE 01/17/2018 1239   HGBUR NEGATIVE 01/17/2018 1239   BILIRUBINUR NEGATIVE 01/17/2018 1239   KETONESUR NEGATIVE 01/17/2018 1239   PROTEINUR NEGATIVE 01/17/2018 1239   NITRITE NEGATIVE 01/17/2018 1239   LEUKOCYTESUR NEGATIVE 01/17/2018 1239   Sepsis Labs: @LABRCNTIP (procalcitonin:4,lacticidven:4)  )No results found for this or any previous visit (from the past 240 hour(s)).       Radiology Studies: Ct Angio Chest Pe W And/or Wo  Contrast  Result Date: 01/17/2018 CLINICAL DATA:  Bradycardia, edema and weight gain. EXAM: CT ANGIOGRAPHY CHEST WITH CONTRAST TECHNIQUE: Multidetector CT imaging of the chest was performed using the standard protocol during bolus administration of intravenous contrast. Multiplanar CT image reconstructions and MIPs were obtained to evaluate the vascular anatomy. CONTRAST:  ISOVUE-370 IOPAMIDOL (ISOVUE-370) INJECTION 76% COMPARISON:  None. FINDINGS: Cardiovascular: No evidence of pulmonary embolism with somewhat limited opacification at the subsegmental level. The central pulmonary arteries are not dilated. The heart is significantly enlarged with enlargement all 4 chambers. The right atrium is particularly enlarged and there is reflux of contrast into a markedly distended intrahepatic IVC and hepatic veins consistent with elevated right heart pressures and right heart failure. No pericardial fluid is identified. The thoracic aorta is normal in caliber. Mediastinum/Nodes: No enlarged mediastinal, hilar, or axillary lymph nodes. Thyroid gland, trachea, and esophagus demonstrate no significant  findings. Lungs/Pleura: Lungs show pulmonary venous hypertension and potentially mild interstitial edema without overt airspace edema. There is a trace amount of right pleural fluid at the lung base. No pulmonary consolidation, pneumothorax or pulmonary masses identified. Upper Abdomen: No acute abnormality. Musculoskeletal: No chest wall abnormality. No acute or significant osseous findings. Review of the MIP images confirms the above findings. IMPRESSION: 1. No evidence of pulmonary embolism. 2. Significant cardiac enlargement and evidence of elevated right heart pressures and right heart failure with marked dilatation of the right atrium and reflux into markedly distended intrahepatic IVC and hepatic veins. 3. Pulmonary venous hypertensive changes in the lungs with potential mild interstitial edema. No overt airspace  edema. Trace right pleural fluid. Electronically Signed   By: Irish Lack M.D.   On: 01/17/2018 16:46   Dg Chest Port 1 View  Result Date: 01/17/2018 CLINICAL DATA:  Shortness of breath EXAM: PORTABLE CHEST 1 VIEW COMPARISON:  12/05/2017 FINDINGS: 1253 hours. Low volume film. The cardio pericardial silhouette is enlarged. There is pulmonary vascular congestion without overt pulmonary edema. The visualized bony structures of the thorax are intact. Telemetry leads overlie the chest. Left-sided pacer/AICD again noted. IMPRESSION: Cardiomegaly with vascular congestion.  Stable exam. Electronically Signed   By: Kennith Center M.D.   On: 01/17/2018 13:05        Scheduled Meds: . acidophilus  1 capsule Oral Daily  . aspirin EC  81 mg Oral Daily  . carvedilol  3.125 mg Oral BID WC  . furosemide  80 mg Intravenous Q12H  . glimepiride  4 mg Oral QAC breakfast  . insulin aspart  0-9 Units Subcutaneous TID WC  . lisinopril  2.5 mg Oral Daily  . magnesium oxide  400 mg Oral Daily  . metolazone  2.5 mg Oral Once per day on Mon Thu  . polycarbophil  625 mg Oral QID  . potassium chloride  40 mEq Oral BID  . simvastatin  40 mg Oral QPM  . sodium chloride flush  3 mL Intravenous Q12H  . spironolactone  12.5 mg Oral Daily  . thyroid  15 mg Oral QAC breakfast   Continuous Infusions: . sodium chloride       LOS: 1 day    Time spent: 35 minutes. Greater than 50% of this time was spent in direct contact with the patient and patient's husband, coordinating care and discussing relevant ongoing clinical issues, including diet and medication strategies to prevent frequent hospitalizations for CHF, also discussed medication changes that would be happening today and over the next few days.Chaya Jan, MD [facility] Pager [phone]  If 7PM-7AM, please contact night-coverage [email or web address] Password [facility] 01/18/2018, 5:51 PM

## 2018-01-18 NOTE — Plan of Care (Signed)
  Problem: Acute Rehab PT Goals(only PT should resolve) Goal: Pt Will Go Supine/Side To Sit Outcome: Progressing Flowsheets (Taken 01/18/2018 0925) Pt will go Supine/Side to Sit: Independently Goal: Patient Will Transfer Sit To/From Stand Outcome: Progressing Flowsheets (Taken 01/18/2018 0925) Patient will transfer sit to/from stand: with modified independence Goal: Pt Will Transfer Bed To Chair/Chair To Bed Outcome: Progressing Flowsheets (Taken 01/18/2018 0925) Pt will Transfer Bed to Chair/Chair to Bed: with modified independence Goal: Pt Will Ambulate Outcome: Progressing Flowsheets (Taken 01/18/2018 0925) Pt will Ambulate: 50 feet;with modified independence;with rolling walker  9:26 AM, 01/18/18 Ocie Bob, MPT Physical Therapist with [facility] [phone] office [phone] mobile phone

## 2018-01-18 NOTE — Evaluation (Signed)
Physical Therapy Evaluation Patient Details Name: Donna Graves MRN: [id number] DOB: August 29, 1951 Today's Date: 01/18/2018   History of Present Illness  Donna Graves is a 67 y.o. female with medical history significant of systolic congestive heart failure with last EF of 25%, morbid obesity, A. fib, pacemaker, diabetes, hypertension, hyperlipidemia comes into the emergency department after 2 weeks of weight gain over 12 pounds.  Patient reports swelling in her legs and her pannus.  She denies any fevers.  She reports that she complies with diet restrictions and salt intake.  She denies any nausea vomiting diarrhea.  Patient is referred for admission for acute congestive heart failure with fluid overload.  Her O2 sats are normal.  She denies any chest pain.    Clinical Impression  Patient presents seated at bedside, has to use bed rail for supine to sitting, no loss of balance for sit to stands, transfers and ambulation in hallway, limited mostly due to fatigue, and near baseline.  Patient will benefit from continued physical therapy in hospital and recommended venue below to increase strength, balance, endurance for safe ADLs and gait.    Follow Up Recommendations Home health PT    Equipment Recommendations  None recommended by PT    Recommendations for Other Services       Precautions / Restrictions Precautions Precautions: Fall Restrictions Weight Bearing Restrictions: No      Mobility  Bed Mobility Overal bed mobility: Modified Independent                Transfers Overall transfer level: Needs assistance Equipment used: Rolling walker (2 wheeled) Transfers: Sit to/from [employer] Transfers Sit to Stand: Supervision Stand pivot transfers: Supervision          Ambulation/Gait Ambulation/Gait assistance: Supervision Ambulation Distance (Feet): 35 Feet Assistive device: Rolling walker (2 wheeled) Gait Pattern/deviations: Decreased step length -  right;Decreased step length - left;Decreased stride length Gait velocity: decreased   General Gait Details: demonstrates slower than normal cadence without loss of balance  Stairs            Wheelchair Mobility    Modified Rankin (Stroke Patients Only)       Balance Overall balance assessment: Needs assistance Sitting-balance support: Feet supported;No upper extremity supported Sitting balance-Leahy Scale: Good     Standing balance support: Bilateral upper extremity supported;During functional activity Standing balance-Leahy Scale: Fair                               Pertinent Vitals/Pain Pain Assessment: No/denies pain    Home Living Family/patient expects to be discharged to:: Private residence Living Arrangements: Spouse/significant other Available Help at Discharge: Family Type of Home: House Home Access: Stairs to enter Entrance Stairs-Rails: None Entrance Stairs-Number of Steps: 1 step to get in  Home Layout: Two level Home Equipment: Environmental consultant - 2 wheels;Cane - single point;Shower seat Additional Comments: patient states whe does not go into basement    Prior Function Level of Independence: Independent with assistive device(s)         Comments: household ambulator with RW PRN     Hand Dominance        Extremity/Trunk Assessment   Upper Extremity Assessment Upper Extremity Assessment: Generalized weakness    Lower Extremity Assessment Lower Extremity Assessment: Generalized weakness    Cervical / Trunk Assessment Cervical / Trunk Assessment: Normal  Communication   Communication: No difficulties  Cognition Arousal/Alertness: Awake/alert Behavior During Therapy:  WFL for tasks assessed/performed Overall Cognitive Status: Within Functional Limits for tasks assessed                                        General Comments      Exercises     Assessment/Plan    PT Assessment Patient needs continued PT  services  PT Problem List Decreased strength;Decreased activity tolerance;Decreased balance;Decreased mobility       PT Treatment Interventions Gait training;Functional mobility training;Therapeutic activities;Therapeutic exercise;Stair training;Patient/family education    PT Goals (Current goals can be found in the Care Plan section)  Acute Rehab PT Goals Patient Stated Goal: return home PT Goal Formulation: With patient Time For Goal Achievement: 01/20/18 Potential to Achieve Goals: Good    Frequency Min 3X/week   Barriers to discharge        Co-evaluation               AM-PAC PT "6 Clicks" Daily Activity  Outcome Measure Difficulty turning over in bed (including adjusting bedclothes, sheets and blankets)?: None Difficulty moving from lying on back to sitting on the side of the bed? : None Difficulty sitting down on and standing up from a chair with arms (e.g., wheelchair, bedside commode, etc,.)?: None Help needed moving to and from a bed to chair (including a wheelchair)?: None Help needed walking in hospital room?: A Little Help needed climbing 3-5 steps with a railing? : A Little 6 Click Score: 22    End of Session   Activity Tolerance: Patient tolerated treatment well;Patient limited by fatigue Patient left: in bed;with call bell/phone within reach(seated at bedside) Nurse Communication: Mobility status PT Visit Diagnosis: Unsteadiness on feet (R26.81);Other abnormalities of gait and mobility (R26.89);Muscle weakness (generalized) (M62.81)    Time: 5852-7782 PT Time Calculation (min) (ACUTE ONLY): 22 min   Charges:   PT Evaluation $PT Eval Low Complexity: 1 Low PT Treatments $Therapeutic Activity: 8-22 mins   PT G Codes:        9:23 AM, Jan 20, 2018 Ocie Bob, MPT Physical Therapist with [facility] 336 [phone] office [phone] mobile phone

## 2018-01-19 LAB — GLUCOSE, CAPILLARY
GLUCOSE-CAPILLARY: 140 mg/dL — AB (ref 65–99)
GLUCOSE-CAPILLARY: 199 mg/dL — AB (ref 65–99)
GLUCOSE-CAPILLARY: 92 mg/dL (ref 65–99)
Glucose-Capillary: 88 mg/dL (ref 65–99)

## 2018-01-19 LAB — BASIC METABOLIC PANEL
Anion gap: 15 (ref 5–15)
BUN: 21 mg/dL — AB (ref 6–20)
CHLORIDE: 91 mmol/L — AB (ref 101–111)
CO2: 29 mmol/L (ref 22–32)
CREATININE: 0.99 mg/dL (ref 0.44–1.00)
Calcium: 9.2 mg/dL (ref 8.9–10.3)
GFR calc Af Amer: >60 mL/min (ref >60)
GFR calc non Af Amer: 59 mL/min — ABNORMAL LOW (ref >60)
Glucose, Bld: 100 mg/dL — ABNORMAL HIGH (ref 65–99)
POTASSIUM: 3.2 mmol/L — AB (ref 3.5–5.1)
Sodium: 135 mmol/L (ref 135–145)

## 2018-01-19 LAB — TSH: TSH: 3.436 u[IU]/mL (ref 0.350–4.500)

## 2018-01-19 MED ORDER — POTASSIUM CHLORIDE CRYS ER 20 MEQ PO TBCR
40.0000 meq | EXTENDED_RELEASE_TABLET | Freq: Once | ORAL | Status: AC
  Administered 2018-01-19: 40 meq via ORAL

## 2018-01-19 MED ORDER — SACUBITRIL-VALSARTAN 24-26 MG PO TABS
1.0000 | ORAL_TABLET | Freq: Two times a day (BID) | ORAL | Status: DC
  Administered 2018-01-19 – 2018-01-25 (×14): 1 via ORAL
  Filled 2018-01-19 (×15): qty 1

## 2018-01-19 NOTE — Progress Notes (Signed)
No ICM remote transmission received for 01/04/2018 and next ICM transmission scheduled for 02/01/2018.

## 2018-01-19 NOTE — Progress Notes (Addendum)
PROGRESS NOTE    Donna Graves  ZOX:[id number] DOB: 14-Jan-1951 DOA: 01/17/2018 PCP: Wilson Singer, MD     Brief Narrative:  67 year old woman admitted from home on 5/1 due to shortness of breath, well-known to [place] for frequent admissions due to congestive heart failure, admitted for same.   Assessment & Plan:   Principal Problem:   Acute on chronic systolic CHF (congestive heart failure) (HCC) Active Problems:   Type II diabetes mellitus (HCC)   AF (paroxysmal atrial fibrillation) (HCC)   Essential hypertension   CAD (coronary artery disease)   Morbid obesity with BMI of 50.0-59.9, adult (HCC)   Hypokalemia   Acute on chronic systolic heart failure -2D echo from January 2019 shows an ejection fraction of 25%. -She is symptomatically improved and has diuresed about 3 L since admission.  However, she still remains hypervolemic on exam. -We will continue current Lasix dose of 80 mg IV twice daily. -She has had multiple hospitalizations for acute heart failure. -Carvedilol dose was increased to 6.25 mg twice daily on 5/2, plan to eventually titrate to maximally tolerated dose and aim for 25 mg twice daily as long as heart rate and blood pressure tolerate. -She is on low-dose ACE inhibitor 2.5 mg daily, this was last given 24 hours ago.  We will go ahead and make an immediate switch to lowest-dose Entresto given 1A indication for systolic heart failure.  Given that this also provides somewhat of a diuretic effect we will go ahead and discontinue Zaroxolyn and see how she does. -She is also on spironolactone 12.5 mg daily. -I reviewed heart failure diet with patient and patient's husband, I believe that she is adhering to this and she also states that she is compliant with her medications. -Interestingly, upon review of patient's home medications I note that she takes Armour Thyroid, however does not have a history of hypothyroidism that is documented.  She has never had a TSH while  hospitalized either.  There is a slight possibility that actual hypothyroidism might be contributing to frequent CHF readmissions, TSH has been ordered, if she is truly hypothyroid would prefer to switch over to Synthroid.  Atrial fibrillation -Rate controlled, does not appear to be chronically anticoagulated.  Morbid obesity -Noted, counseled on diet and exercise.  Hypokalemia -Likely related to diuresis, continue to replace with oral supplementation. -Magnesium was normal at 2.3  Essential hypertension -Well-controlled, of note Coreg has been increased as of 5/2, and low-dose Entresto added on 5/3.   DVT prophylaxis: SCDs Code Status: Full code Family Communication: Patient only, husband on 5/2 Disposition Plan: Anticipate discharge home once adequate diuresis, suspect at least 48 to 72 hours.  Consultants:   None  Procedures:   None  Antimicrobials:  Anti-infectives (From admission, onward)   None       Subjective: Feels well, no complaints, denies chest pain or shortness of breath.  Objective: Vitals:   01/18/18 0426 01/18/18 1425 01/19/18 0630 01/19/18 0632  BP: (!) 98/45 124/84  (!) 106/92  Pulse: 69 70  72  Resp:  18  20  Temp: 97.9 F (36.6 C) 97.8 F (36.6 C)  97.9 F (36.6 C)  TempSrc: Oral Oral  Oral  SpO2: 99% 99%  96%  Weight:   (!) 148 kg (326 lb 4.5 oz)   Height:        Intake/Output Summary (Last 24 hours) at 01/19/2018 1157 Last data filed at 01/19/2018 0930 Gross per 24 hour  Intake 840 ml  Output 3400 ml  Net -2560 ml   Filed Weights   01/17/18 2153 01/18/18 0300 01/19/18 0630  Weight: (!) 147.9 kg (326 lb 1 oz) (!) 148.4 kg (327 lb 2.6 oz) (!) 148 kg (326 lb 4.5 oz)    Examination:  General exam: Alert, awake, oriented x 3, morbidly obese Respiratory system: Clear to auscultation. Respiratory effort normal. Cardiovascular system:RRR. No murmurs, rubs, gallops. Gastrointestinal system: Abdomen is nondistended, soft and nontender.  No organomegaly or masses felt. Normal bowel sounds heard. Central nervous system: Alert and oriented. No focal neurological deficits. Extremities: 2+ pitting edema bilaterally to mid-calves, +pedal pulses Skin: No rashes, lesions or ulcers Psychiatry: Judgement and insight appear normal. Mood & affect appropriate.      Data Reviewed: I have personally reviewed following labs and imaging studies  CBC: Recent Labs  Lab 01/17/18 1320  WBC 5.0  NEUTROABS 3.2  HGB 15.3*  HCT 47.3*  MCV 92.0  PLT 197   Basic Metabolic Panel: Recent Labs  Lab 01/17/18 1320 01/17/18 1506 01/18/18 0604 01/19/18 0508  NA 135  --  135 135  K 2.8*  --  3.6 3.2*  CL 87*  --  92* 91*  CO2 32  --  26 29  GLUCOSE 113*  --  73 100*  BUN 22*  --  20 21*  CREATININE 1.05*  --  0.94 0.99  CALCIUM 9.5  --  9.3 9.2  MG  --  2.3  --   --    GFR: Estimated Creatinine Clearance: 82.3 mL/min (by C-G formula based on SCr of 0.99 mg/dL). Liver Function Tests: Recent Labs  Lab 01/17/18 1320  AST 61*  ALT 18  ALKPHOS 165*  BILITOT 2.2*  PROT 6.6  ALBUMIN 3.0*   No results for input(s): LIPASE, AMYLASE in the last 168 hours. No results for input(s): AMMONIA in the last 168 hours. Coagulation Profile: No results for input(s): INR, PROTIME in the last 168 hours. Cardiac Enzymes: No results for input(s): CKTOTAL, CKMB, CKMBINDEX, TROPONINI in the last 168 hours. BNP (last 3 results) No results for input(s): PROBNP in the last 8760 hours. HbA1C: No results for input(s): HGBA1C in the last 72 hours. CBG: Recent Labs  Lab 01/18/18 0735 01/18/18 1135 01/18/18 1620 01/18/18 2203 01/19/18 0759  GLUCAP 82 147* 148* 80 88   Lipid Profile: No results for input(s): CHOL, HDL, LDLCALC, TRIG, CHOLHDL, LDLDIRECT in the last 72 hours. Thyroid Function Tests: Recent Labs    01/19/18 0926  TSH 3.436   Anemia Panel: No results for input(s): VITAMINB12, FOLATE, FERRITIN, TIBC, IRON, RETICCTPCT in the  last 72 hours. Urine analysis:    Component Value Date/Time   COLORURINE STRAW (A) 01/17/2018 1239   APPEARANCEUR CLEAR 01/17/2018 1239   LABSPEC 1.005 01/17/2018 1239   PHURINE 8.0 01/17/2018 1239   GLUCOSEU NEGATIVE 01/17/2018 1239   HGBUR NEGATIVE 01/17/2018 1239   BILIRUBINUR NEGATIVE 01/17/2018 1239   KETONESUR NEGATIVE 01/17/2018 1239   PROTEINUR NEGATIVE 01/17/2018 1239   NITRITE NEGATIVE 01/17/2018 1239   LEUKOCYTESUR NEGATIVE 01/17/2018 1239   Sepsis Labs: @LABRCNTIP (procalcitonin:4,lacticidven:4)  )No results found for this or any previous visit (from the past 240 hour(s)).       Radiology Studies: Ct Angio Chest Pe W And/or Wo Contrast  Result Date: 01/17/2018 CLINICAL DATA:  Bradycardia, edema and weight gain. EXAM: CT ANGIOGRAPHY CHEST WITH CONTRAST TECHNIQUE: Multidetector CT imaging of the chest was performed using the standard protocol during bolus administration of intravenous contrast.  Multiplanar CT image reconstructions and MIPs were obtained to evaluate the vascular anatomy. CONTRAST:  ISOVUE-370 IOPAMIDOL (ISOVUE-370) INJECTION 76% COMPARISON:  None. FINDINGS: Cardiovascular: No evidence of pulmonary embolism with somewhat limited opacification at the subsegmental level. The central pulmonary arteries are not dilated. The heart is significantly enlarged with enlargement all 4 chambers. The right atrium is particularly enlarged and there is reflux of contrast into a markedly distended intrahepatic IVC and hepatic veins consistent with elevated right heart pressures and right heart failure. No pericardial fluid is identified. The thoracic aorta is normal in caliber. Mediastinum/Nodes: No enlarged mediastinal, hilar, or axillary lymph nodes. Thyroid gland, trachea, and esophagus demonstrate no significant findings. Lungs/Pleura: Lungs show pulmonary venous hypertension and potentially mild interstitial edema without overt airspace edema. There is a trace amount of  right pleural fluid at the lung base. No pulmonary consolidation, pneumothorax or pulmonary masses identified. Upper Abdomen: No acute abnormality. Musculoskeletal: No chest wall abnormality. No acute or significant osseous findings. Review of the MIP images confirms the above findings. IMPRESSION: 1. No evidence of pulmonary embolism. 2. Significant cardiac enlargement and evidence of elevated right heart pressures and right heart failure with marked dilatation of the right atrium and reflux into markedly distended intrahepatic IVC and hepatic veins. 3. Pulmonary venous hypertensive changes in the lungs with potential mild interstitial edema. No overt airspace edema. Trace right pleural fluid. Electronically Signed   By: Irish Lack M.D.   On: 01/17/2018 16:46   Dg Chest Port 1 View  Result Date: 01/17/2018 CLINICAL DATA:  Shortness of breath EXAM: PORTABLE CHEST 1 VIEW COMPARISON:  12/05/2017 FINDINGS: 1253 hours. Low volume film. The cardio pericardial silhouette is enlarged. There is pulmonary vascular congestion without overt pulmonary edema. The visualized bony structures of the thorax are intact. Telemetry leads overlie the chest. Left-sided pacer/AICD again noted. IMPRESSION: Cardiomegaly with vascular congestion.  Stable exam. Electronically Signed   By: Kennith Center M.D.   On: 01/17/2018 13:05        Scheduled Meds: . acidophilus  1 capsule Oral Daily  . aspirin EC  81 mg Oral Daily  . carvedilol  6.25 mg Oral BID WC  . furosemide  80 mg Intravenous Q12H  . glimepiride  4 mg Oral QAC breakfast  . insulin aspart  0-9 Units Subcutaneous TID WC  . magnesium oxide  400 mg Oral Daily  . polycarbophil  625 mg Oral QID  . potassium chloride  40 mEq Oral BID  . sacubitril-valsartan  1 tablet Oral BID  . simvastatin  40 mg Oral QPM  . sodium chloride flush  3 mL Intravenous Q12H  . spironolactone  12.5 mg Oral Daily  . thyroid  15 mg Oral QAC breakfast   Continuous Infusions: .  sodium chloride       LOS: 2 days    Time spent: 35 minutes.  Greater than 50% of this time was spent in direct contact with the patient, coordinating care and discussing relevant ongoing clinical issues including diet and medication strategies to prevent further hospitalizations for CHF, also discussed reasoning behind medication changes that we started making on 5/2 and will probably continue to adjust over the next 2 to 3 days.     Chaya Jan, MD [facility] Pager [phone]  If 7PM-7AM, please contact night-coverage [email or web address] Password [email or web address] 01/19/2018, 11:57 AM

## 2018-01-19 NOTE — Care Management Important Message (Signed)
Important Message  Patient Details  Name: Donna Graves MRN: [id number] Date of Birth: 03/15/1951   Medicare Important Message Given:  Yes    Renie Ora 01/19/2018, 2:14 PM

## 2018-01-19 NOTE — Care Management Note (Addendum)
Case Management Note  Patient Details  Name: Donna Graves MRN: [id number] Date of Birth: August 06, 1951  Subjective/Objective:         Admitted with CHF. Pt from home, lives with husband. Pt is active with [facility].            Action/Plan: DC home with resumption of HH services. Pt aware that [facility] has 48 hrs to make first vist. [facility] rep, aware of DC plan. HH orders and DC summary will be faxed at DC. Pt being started on Entresto; given 30-day free card.   Expected Discharge Date:     01/20/2018             Expected Discharge Plan:  Home w Home Health Services  In-House Referral:  NA  Discharge planning Services  CM Consult  Post Acute Care Choice:  Resumption of Svcs/PTA Provider, Home Health Choice offered to:  Patient  HH Arranged:  RN, PT [facility] Agency:  [facility]  Status of Service:  Completed, signed off  Malcolm Metro, RN 01/19/2018, 10:03 AM

## 2018-01-20 LAB — BASIC METABOLIC PANEL
Anion gap: 13 (ref 5–15)
BUN: 20 mg/dL (ref 6–20)
CALCIUM: 9 mg/dL (ref 8.9–10.3)
CO2: 30 mmol/L (ref 22–32)
Chloride: 90 mmol/L — ABNORMAL LOW (ref 101–111)
Creatinine, Ser: 0.91 mg/dL (ref 0.44–1.00)
GFR calc Af Amer: >60 mL/min (ref >60)
GFR calc non Af Amer: >60 mL/min (ref >60)
Glucose, Bld: 68 mg/dL (ref 65–99)
POTASSIUM: 3.7 mmol/L (ref 3.5–5.1)
SODIUM: 133 mmol/L — AB (ref 135–145)

## 2018-01-20 LAB — GLUCOSE, CAPILLARY
GLUCOSE-CAPILLARY: 79 mg/dL (ref 65–99)
GLUCOSE-CAPILLARY: 112 mg/dL — AB (ref 65–99)
Glucose-Capillary: 142 mg/dL — ABNORMAL HIGH (ref 65–99)
Glucose-Capillary: 174 mg/dL — ABNORMAL HIGH (ref 65–99)

## 2018-01-20 MED ORDER — CARVEDILOL 12.5 MG PO TABS
12.5000 mg | ORAL_TABLET | Freq: Two times a day (BID) | ORAL | Status: DC
  Administered 2018-01-20 – 2018-01-21 (×2): 12.5 mg via ORAL
  Filled 2018-01-20 (×2): qty 1

## 2018-01-20 NOTE — Progress Notes (Signed)
PROGRESS NOTE    Donna Graves  FWY:[id number] DOB: Jan 22, 1951 DOA: 01/17/2018 PCP: Wilson Singer, MD     Brief Narrative:  67 year old woman admitted from home on 5/1 due to shortness of breath, well-known to [place] for frequent admissions due to congestive heart failure, admitted for same.   Assessment & Plan:   Principal Problem:   Acute on chronic systolic CHF (congestive heart failure) (HCC) Active Problems:   Type II diabetes mellitus (HCC)   AF (paroxysmal atrial fibrillation) (HCC)   Essential hypertension   CAD (coronary artery disease)   Morbid obesity with BMI of 50.0-59.9, adult (HCC)   Hypokalemia   Acute on chronic systolic heart failure -2D echo from January 2019 shows an ejection fraction of 25%. -She is symptomatically improved and has diuresed about 6.6 L since admission.  However, she still remains hypervolemic on exam. -We will continue current Lasix dose of 80 mg IV twice daily. -She has had multiple hospitalizations for acute heart failure. -Carvedilol dose was increased to 6.25 mg twice daily on 5/2 and further increased to 12.5 mg twice daily on 5/4, plan to eventually titrate to maximally tolerated dose and aim for 25 mg twice daily as long as heart rate and blood pressure tolerate. -She was on low-dose ACE inhibitor 2.5 mg daily.  We have made the switch over to [facility] given 1A indication for systolic heart failure.  This dose will need to be slowly increased as renal function and blood pressure tolerates.  Given that this also provides somewhat of a diuretic effect we will go ahead and discontinue Zaroxolyn and see how she does. -She is also on spironolactone 12.5 mg daily. -I reviewed heart failure diet with patient and patient's husband, I believe that she is adhering to this and she also states that she is compliant with her medications. -TSH is normal at 3.436.  Atrial fibrillation -Rate controlled, does not appear to be chronically  anticoagulated.  Morbid obesity -Noted, counseled on diet and exercise.  Hypokalemia -Likely related to diuresis, continue to replace with oral supplementation. -Magnesium was normal at 2.3 -Potassium was 3.7 on 5/4  Essential hypertension -Well-controlled, of note Coreg has been increased as of 5/2 and again on 5/4, and low-dose Entresto added on 5/3.   DVT prophylaxis: SCDs Code Status: Full code Family Communication: Husband and patient's sister at bedside updated on plan of care and all questions answered Disposition Plan: Anticipate discharge home once adequate diuresis, suspect at least 48 to 72 hours.  Consultants:   None  Procedures:   None  Antimicrobials:  Anti-infectives (From admission, onward)   None       Subjective: Feels significantly improved, denies chest pain or shortness of breath, states her legs feel less tight.  Objective: Vitals:   01/19/18 0632 01/19/18 1353 01/19/18 2130 01/20/18 0542  BP: (!) 106/92 104/65 106/62 105/64  Pulse: 72 71 68 72  Resp: 20 18 18 18   Temp: 97.9 F (36.6 C) 98.4 F (36.9 C) 97.8 F (36.6 C) 98.2 F (36.8 C)  TempSrc: Oral Oral Oral Oral  SpO2: 96% 95% 96% 96%  Weight:    (!) 148.5 kg (327 lb 6.1 oz)  Height:        Intake/Output Summary (Last 24 hours) at 01/20/2018 1555 Last data filed at 01/20/2018 1212 Gross per 24 hour  Intake 600 ml  Output 4400 ml  Net -3800 ml   Filed Weights   01/18/18 0300 01/19/18 0630 01/20/18 0542  Weight: (!) 148.4 kg (327 lb 2.6 oz) (!) 148 kg (326 lb 4.5 oz) (!) 148.5 kg (327 lb 6.1 oz)    Examination:  General exam: Alert, awake, oriented x 3, morbidly obese Respiratory system: Clear to auscultation. Respiratory effort normal. Cardiovascular system:RRR. No murmurs, rubs, gallops. Gastrointestinal system: Abdomen is nondistended, soft and nontender. No organomegaly or masses felt. Normal bowel sounds heard. Central nervous system: Alert and oriented. No focal  neurological deficits. Extremities: 2+ pitting edema bilaterally Skin: No rashes, lesions or ulcers Psychiatry: Judgement and insight appear normal. Mood & affect appropriate.       Data Reviewed: I have personally reviewed following labs and imaging studies  CBC: Recent Labs  Lab 01/17/18 1320  WBC 5.0  NEUTROABS 3.2  HGB 15.3*  HCT 47.3*  MCV 92.0  PLT 197   Basic Metabolic Panel: Recent Labs  Lab 01/17/18 1320 01/17/18 1506 01/18/18 0604 01/19/18 0508 01/20/18 0642  NA 135  --  135 135 133*  K 2.8*  --  3.6 3.2* 3.7  CL 87*  --  92* 91* 90*  CO2 32  --  26 29 30   GLUCOSE 113*  --  73 100* 68  BUN 22*  --  20 21* 20  CREATININE 1.05*  --  0.94 0.99 0.91  CALCIUM 9.5  --  9.3 9.2 9.0  MG  --  2.3  --   --   --    GFR: Estimated Creatinine Clearance: 89.7 mL/min (by C-G formula based on SCr of 0.91 mg/dL). Liver Function Tests: Recent Labs  Lab 01/17/18 1320  AST 61*  ALT 18  ALKPHOS 165*  BILITOT 2.2*  PROT 6.6  ALBUMIN 3.0*   No results for input(s): LIPASE, AMYLASE in the last 168 hours. No results for input(s): AMMONIA in the last 168 hours. Coagulation Profile: No results for input(s): INR, PROTIME in the last 168 hours. Cardiac Enzymes: No results for input(s): CKTOTAL, CKMB, CKMBINDEX, TROPONINI in the last 168 hours. BNP (last 3 results) No results for input(s): PROBNP in the last 8760 hours. HbA1C: No results for input(s): HGBA1C in the last 72 hours. CBG: Recent Labs  Lab 01/19/18 1207 01/19/18 1632 01/19/18 2129 01/20/18 0737 01/20/18 1134  GLUCAP 140* 199* 92 79 142*   Lipid Profile: No results for input(s): CHOL, HDL, LDLCALC, TRIG, CHOLHDL, LDLDIRECT in the last 72 hours. Thyroid Function Tests: Recent Labs    01/19/18 0926  TSH 3.436   Anemia Panel: No results for input(s): VITAMINB12, FOLATE, FERRITIN, TIBC, IRON, RETICCTPCT in the last 72 hours. Urine analysis:    Component Value Date/Time   COLORURINE STRAW (A)  01/17/2018 1239   APPEARANCEUR CLEAR 01/17/2018 1239   LABSPEC 1.005 01/17/2018 1239   PHURINE 8.0 01/17/2018 1239   GLUCOSEU NEGATIVE 01/17/2018 1239   HGBUR NEGATIVE 01/17/2018 1239   BILIRUBINUR NEGATIVE 01/17/2018 1239   KETONESUR NEGATIVE 01/17/2018 1239   PROTEINUR NEGATIVE 01/17/2018 1239   NITRITE NEGATIVE 01/17/2018 1239   LEUKOCYTESUR NEGATIVE 01/17/2018 1239   Sepsis Labs: @LABRCNTIP (procalcitonin:4,lacticidven:4)  )No results found for this or any previous visit (from the past 240 hour(s)).       Radiology Studies: No results found.      Scheduled Meds: . acidophilus  1 capsule Oral Daily  . aspirin EC  81 mg Oral Daily  . carvedilol  12.5 mg Oral BID WC  . furosemide  80 mg Intravenous Q12H  . glimepiride  4 mg Oral QAC breakfast  . insulin  aspart  0-9 Units Subcutaneous TID WC  . magnesium oxide  400 mg Oral Daily  . polycarbophil  625 mg Oral QID  . potassium chloride  40 mEq Oral BID  . sacubitril-valsartan  1 tablet Oral BID  . simvastatin  40 mg Oral QPM  . sodium chloride flush  3 mL Intravenous Q12H  . spironolactone  12.5 mg Oral Daily  . thyroid  15 mg Oral QAC breakfast   Continuous Infusions: . sodium chloride       LOS: 3 days    Time spent: 25 minutes.    Chaya Jan, MD [facility] Pager [phone]  If 7PM-7AM, please contact night-coverage [email or web address] Password [email or web address] 01/20/2018, 3:55 PM

## 2018-01-21 LAB — GLUCOSE, CAPILLARY
GLUCOSE-CAPILLARY: 59 mg/dL — AB (ref 65–99)
Glucose-Capillary: 72 mg/dL (ref 65–99)
Glucose-Capillary: 167 mg/dL — ABNORMAL HIGH (ref 65–99)
Glucose-Capillary: 116 mg/dL — ABNORMAL HIGH (ref 65–99)
Glucose-Capillary: 116 mg/dL — ABNORMAL HIGH (ref 65–99)

## 2018-01-21 LAB — BASIC METABOLIC PANEL
Anion gap: 8 (ref 5–15)
BUN: 26 mg/dL — AB (ref 6–20)
CALCIUM: 9 mg/dL (ref 8.9–10.3)
CHLORIDE: 92 mmol/L — AB (ref 101–111)
CO2: 33 mmol/L — ABNORMAL HIGH (ref 22–32)
Creatinine, Ser: 0.96 mg/dL (ref 0.44–1.00)
GFR calc Af Amer: >60 mL/min (ref >60)
Glucose, Bld: 60 mg/dL — ABNORMAL LOW (ref 65–99)
Potassium: 4.1 mmol/L (ref 3.5–5.1)
SODIUM: 133 mmol/L — AB (ref 135–145)

## 2018-01-21 MED ORDER — SIMETHICONE 40 MG/0.6ML PO SUSP
ORAL | Status: AC
  Filled 2018-01-21: qty 30

## 2018-01-21 MED ORDER — CARVEDILOL 3.125 MG PO TABS
6.2500 mg | ORAL_TABLET | Freq: Two times a day (BID) | ORAL | Status: DC
  Administered 2018-01-22 – 2018-01-25 (×7): 6.25 mg via ORAL
  Filled 2018-01-21 (×8): qty 2

## 2018-01-21 MED ORDER — LIDOCAINE VISCOUS 2 % MT SOLN
OROMUCOSAL | Status: AC
  Filled 2018-01-21: qty 15

## 2018-01-21 NOTE — Progress Notes (Signed)
   01/21/18 1650  Vitals  Temp 97.8 F (36.6 C)  Temp Source Oral  BP 90/64  MAP (mmHg) 72  BP Location Left Arm  BP Method Automatic  Patient Position (if appropriate) Sitting  Pulse Rate 79  Pulse Rate Source Monitor  Resp 18  Oxygen Therapy  SpO2 99 %  O2 Device Room Air  Late entry:  Dr. Ardyth Harps notified of above VS.  Dr. Ardyth Harps gave order to hold evening dose of Coreg and give evening dose of Lasix.

## 2018-01-21 NOTE — Progress Notes (Signed)
   01/21/18 1140  Vitals  BP (!) 96/53  BP Location Left Arm (upper)  Notified Dr. Ardyth Harps of above VS and 1139 VS.

## 2018-01-21 NOTE — Progress Notes (Signed)
Patient placed on 2lpm oxygen for sleep low sats. Patient has sleep apnea has had test 4/18 has chronic sleep apnea No CPAP machine as yet given .

## 2018-01-21 NOTE — Progress Notes (Signed)
PROGRESS NOTE    Donna Graves  HQR:[id number] DOB: 11-03-1950 DOA: 01/17/2018 PCP: Wilson Singer, MD     Brief Narrative:  67 year old woman admitted from home on 5/1 due to shortness of breath, well-known to [place] for frequent admissions due to congestive heart failure, admitted for same.   Assessment & Plan:   Principal Problem:   Acute on chronic systolic CHF (congestive heart failure) (HCC) Active Problems:   Type II diabetes mellitus (HCC)   AF (paroxysmal atrial fibrillation) (HCC)   Essential hypertension   CAD (coronary artery disease)   Morbid obesity with BMI of 50.0-59.9, adult (HCC)   Hypokalemia   Acute on chronic systolic heart failure -2D echo from January 2019 shows an ejection fraction of 25%. -She is symptomatically improved and has diuresed about 9.3 L since admission.  However, she still remains hypervolemic on exam. -We will continue current Lasix dose of 80 mg IV twice daily. -Renal function remains good. -She has had multiple hospitalizations for acute heart failure. -Carvedilol dose was increased to 6.25 mg twice daily on 5/2 and further increased to 12.5 mg twice daily on 5/4, plan to eventually titrate to maximally tolerated dose and aim for 25 mg twice daily as long as heart rate and blood pressure tolerate. -Required dose reduction of Coreg down to 6.25 twice daily on 5/5 due to hypotension overnight. -She was on low-dose ACE inhibitor 2.5 mg daily.  We have made the switch over to [facility] given 1A indication for systolic heart failure.  This dose will need to be slowly increased as renal function and blood pressure tolerates.  Given that this also provides somewhat of a diuretic effect we will go ahead and discontinue Zaroxolyn and see how she does. -She is also on spironolactone 12.5 mg daily. -I reviewed heart failure diet with patient and patient's husband, I believe that she is adhering to this and she also states that she is  compliant with her medications. -TSH is normal at 3.436.  Atrial fibrillation -Rate controlled, does not appear to be chronically anticoagulated.  Morbid obesity -Noted, counseled on diet and exercise.  Hypokalemia -Likely related to diuresis, continue to replace with oral supplementation. -Magnesium was normal at 2.3 -Potassium was 4.1 on 5/4  Essential hypertension -Well-controlled on current medications.  DM II -Hypoglycemic on 5/5. Amaryl held today. -Has not been hypoglycemic prior to this.   DVT prophylaxis: SCDs Code Status: Full code Family Communication: Husband and patient's sister at bedside updated on plan of care and all questions answered Disposition Plan: Anticipate discharge home once adequate diuresis, suspect at least 48 to 72 hours.  Consultants:   None  Procedures:   None  Antimicrobials:  Anti-infectives (From admission, onward)   None       Subjective: Was hypoglycemic this am and felt dizzy when this occurred.  Objective: Vitals:   01/21/18 0804 01/21/18 0915 01/21/18 1139 01/21/18 1140  BP: (!) 105/39  (!) 100/41 (!) 96/53  Pulse:  71 77   Resp: 20  (!) 21   Temp:      TempSrc:      SpO2:   96%   Weight:      Height:        Intake/Output Summary (Last 24 hours) at 01/21/2018 1246 Last data filed at 01/21/2018 0659 Gross per 24 hour  Intake 723 ml  Output 2350 ml  Net -1627 ml   Filed Weights   01/19/18 0630 01/20/18 0542 01/21/18 0504  Weight: Marland Kitchen)  148 kg (326 lb 4.5 oz) (!) 148.5 kg (327 lb 6.1 oz) (!) 148 kg (326 lb 4.5 oz)    Examination:  General exam: Alert, awake, oriented x 3, morbidly obese Respiratory system: Clear to auscultation. Respiratory effort normal. Cardiovascular system:RRR. No murmurs, rubs, gallops. Gastrointestinal system: Abdomen is nondistended, soft and nontender. No organomegaly or masses felt. Normal bowel sounds heard. Central nervous system: Alert and oriented. No focal neurological  deficits. Extremities: 2-3+ edema bilaterally +pedal pulses Skin: No rashes, lesions or ulcers Psychiatry: Judgement and insight appear normal. Mood & affect appropriate.        Data Reviewed: I have personally reviewed following labs and imaging studies  CBC: Recent Labs  Lab 01/17/18 1320  WBC 5.0  NEUTROABS 3.2  HGB 15.3*  HCT 47.3*  MCV 92.0  PLT 197   Basic Metabolic Panel: Recent Labs  Lab 01/17/18 1320 01/17/18 1506 01/18/18 0604 01/19/18 0508 01/20/18 0642 01/21/18 0653  NA 135  --  135 135 133* 133*  K 2.8*  --  3.6 3.2* 3.7 4.1  CL 87*  --  92* 91* 90* 92*  CO2 32  --  26 29 30  33*  GLUCOSE 113*  --  73 100* 68 60*  BUN 22*  --  20 21* 20 26*  CREATININE 1.05*  --  0.94 0.99 0.91 0.96  CALCIUM 9.5  --  9.3 9.2 9.0 9.0  MG  --  2.3  --   --   --   --    GFR: Estimated Creatinine Clearance: 84.9 mL/min (by C-G formula based on SCr of 0.96 mg/dL). Liver Function Tests: Recent Labs  Lab 01/17/18 1320  AST 61*  ALT 18  ALKPHOS 165*  BILITOT 2.2*  PROT 6.6  ALBUMIN 3.0*   No results for input(s): LIPASE, AMYLASE in the last 168 hours. No results for input(s): AMMONIA in the last 168 hours. Coagulation Profile: No results for input(s): INR, PROTIME in the last 168 hours. Cardiac Enzymes: No results for input(s): CKTOTAL, CKMB, CKMBINDEX, TROPONINI in the last 168 hours. BNP (last 3 results) No results for input(s): PROBNP in the last 8760 hours. HbA1C: No results for input(s): HGBA1C in the last 72 hours. CBG: Recent Labs  Lab 01/20/18 1638 01/20/18 2117 01/21/18 0801 01/21/18 0832 01/21/18 1130  GLUCAP 174* 112* 59* 72 167*   Lipid Profile: No results for input(s): CHOL, HDL, LDLCALC, TRIG, CHOLHDL, LDLDIRECT in the last 72 hours. Thyroid Function Tests: Recent Labs    01/19/18 0926  TSH 3.436   Anemia Panel: No results for input(s): VITAMINB12, FOLATE, FERRITIN, TIBC, IRON, RETICCTPCT in the last 72 hours. Urine analysis:     Component Value Date/Time   COLORURINE STRAW (A) 01/17/2018 1239   APPEARANCEUR CLEAR 01/17/2018 1239   LABSPEC 1.005 01/17/2018 1239   PHURINE 8.0 01/17/2018 1239   GLUCOSEU NEGATIVE 01/17/2018 1239   HGBUR NEGATIVE 01/17/2018 1239   BILIRUBINUR NEGATIVE 01/17/2018 1239   KETONESUR NEGATIVE 01/17/2018 1239   PROTEINUR NEGATIVE 01/17/2018 1239   NITRITE NEGATIVE 01/17/2018 1239   LEUKOCYTESUR NEGATIVE 01/17/2018 1239   Sepsis Labs: @LABRCNTIP (procalcitonin:4,lacticidven:4)  )No results found for this or any previous visit (from the past 240 hour(s)).       Radiology Studies: No results found.      Scheduled Meds: . lidocaine      . simethicone      . acidophilus  1 capsule Oral Daily  . aspirin EC  81 mg Oral Daily  .  carvedilol  6.25 mg Oral BID WC  . furosemide  80 mg Intravenous Q12H  . glimepiride  4 mg Oral QAC breakfast  . insulin aspart  0-9 Units Subcutaneous TID WC  . magnesium oxide  400 mg Oral Daily  . polycarbophil  625 mg Oral QID  . potassium chloride  40 mEq Oral BID  . sacubitril-valsartan  1 tablet Oral BID  . simvastatin  40 mg Oral QPM  . sodium chloride flush  3 mL Intravenous Q12H  . spironolactone  12.5 mg Oral Daily  . thyroid  15 mg Oral QAC breakfast   Continuous Infusions: . sodium chloride       LOS: 4 days    Time spent: 25 minutes.    Chaya Jan, MD [facility] Pager [phone]  If 7PM-7AM, please contact night-coverage [email or web address] Password [email or web address] 01/21/2018, 12:46 PM

## 2018-01-22 LAB — BASIC METABOLIC PANEL
ANION GAP: 9 (ref 5–15)
BUN: 30 mg/dL — ABNORMAL HIGH (ref 6–20)
CALCIUM: 9.1 mg/dL (ref 8.9–10.3)
CHLORIDE: 91 mmol/L — AB (ref 101–111)
CO2: 31 mmol/L (ref 22–32)
CREATININE: 1.08 mg/dL — AB (ref 0.44–1.00)
GFR, EST NON AFRICAN AMERICAN: 53 mL/min — AB (ref >60)
Glucose, Bld: 76 mg/dL (ref 65–99)
POTASSIUM: 5 mmol/L (ref 3.5–5.1)
SODIUM: 131 mmol/L — AB (ref 135–145)

## 2018-01-22 LAB — GLUCOSE, CAPILLARY
GLUCOSE-CAPILLARY: 76 mg/dL (ref 65–99)
GLUCOSE-CAPILLARY: 131 mg/dL — AB (ref 65–99)
Glucose-Capillary: 165 mg/dL — ABNORMAL HIGH (ref 65–99)
Glucose-Capillary: 90 mg/dL (ref 65–99)

## 2018-01-22 NOTE — Progress Notes (Signed)
Inpatient Diabetes Program Recommendations  AACE/ADA: New Consensus Statement on Inpatient Glycemic Control (2019)  Target Ranges:  Prepandial:   less than 140 mg/dL      Peak postprandial:   less than 180 mg/dL (1-2 hours)      Critically ill patients:  140 - 180 mg/dL   Results for Donna Graves, Donna Graves (MRN [id number]) as of 01/22/2018 08:50  Ref. Range 01/21/2018 08:01 01/21/2018 08:32 01/21/2018 11:30 01/21/2018 16:46 01/21/2018 21:47 01/22/2018 07:22  Glucose-Capillary Latest Ref Range: 65 - 99 mg/dL 59 (L) 72 935 (H) 701 (H) 116 (H) 76   Review of Glycemic Control  Diabetes history: DM2 Outpatient Diabetes medications: Amaryl 4 mg daily Current orders for Inpatient glycemic control: Amaryl 4 mg daily, Novolog 0-9 units TID with meals  Inpatient Diabetes Program Recommendations: Oral Agents: Fasting glucose 59 mg/dl on 03/25/92 and 76 mg/dl today. Please consider discontinuing Amaryl while inpatient.  Thanks, Orlando Penner, RN, MSN, CDE Diabetes Coordinator Inpatient Diabetes Program [phone] (Team Pager from 8am to 5pm)

## 2018-01-22 NOTE — Progress Notes (Signed)
BP 80/38,  MD paged and instructed to hold 0600 Lasix dose.

## 2018-01-22 NOTE — Progress Notes (Signed)
PROGRESS NOTE    Donna Graves  WUJ:[id number] DOB: 1951-04-15 DOA: 01/17/2018 PCP: Wilson Singer, MD     Brief Narrative:  67 year old woman admitted from home on 5/1 due to shortness of breath, well-known to [place] for frequent admissions due to congestive heart failure, admitted for same.   Assessment & Plan:   Principal Problem:   Acute on chronic systolic CHF (congestive heart failure) (HCC) Active Problems:   Type II diabetes mellitus (HCC)   AF (paroxysmal atrial fibrillation) (HCC)   Essential hypertension   CAD (coronary artery disease)   Morbid obesity with BMI of 50.0-59.9, adult (HCC)   Hypokalemia   Acute on chronic systolic heart failure -2D echo from January 2019 shows an ejection fraction of 25%. -She is symptomatically improved and has diuresed about 11.5 L since admission.  However, she still remains hypervolemic on exam. -We will continue current Lasix dose of 80 mg IV twice daily. -Renal function has started to increase today, if creatinine bumps again tomorrow, will de-escalate Lasix. -She has had multiple hospitalizations for acute heart failure. -Carvedilol dose was increased to 6.25 mg twice daily on 5/2 and further increased to 12.5 mg twice daily on 5/4, plan to eventually titrate to maximally tolerated dose and aim for 25 mg twice daily as long as heart rate and blood pressure tolerate. -Required dose reduction of Coreg down to 6.25 twice daily on 5/5 due to hypotension. -She was on low-dose ACE inhibitor 2.5 mg daily.  We have made the switch over to [facility] given 1A indication for systolic heart failure.  This dose will need to be slowly increased as renal function and blood pressure tolerates.  Given that this also provides somewhat of a diuretic effect we will go ahead and discontinue Zaroxolyn and see how she does. -She is also on spironolactone 12.5 mg daily. -I reviewed heart failure diet with patient and patient's husband, I believe  that she is adhering to this and she also states that she is compliant with her medications. -TSH is normal at 3.436.  Atrial fibrillation -Rate controlled, does not appear to be chronically anticoagulated.  Morbid obesity -Noted, counseled on diet and exercise.  Hypokalemia -Likely related to diuresis, continue to replace with oral supplementation. -Magnesium was normal at 2.3 -Potassium was 4.1 on 5/4  Essential hypertension -Well-controlled on current medications.  DM II -Hypoglycemic on 5/5. Amaryl held today. -Has not been hypoglycemic prior to this.   DVT prophylaxis: SCDs Code Status: Full code Family Communication: Husband at bedside updated on plan of care and all questions answered Disposition Plan: Anticipate discharge home once adequate diuresis, suspect at least 48 to 72 hours.  Consultants:   None  Procedures:   None  Antimicrobials:  Anti-infectives (From admission, onward)   None       Subjective: Sitting in recliner at bedside, feels well, no further complaints.  Objective: Vitals:   01/22/18 0504 01/22/18 0946 01/22/18 1447 01/22/18 1822  BP: (!) 80/38 (!) 128/93 (!) 80/46 (!) 109/52  Pulse: 70 69 69   Resp: (!) 22 18 18    Temp:  97.8 F (36.6 C) 97.6 F (36.4 C)   TempSrc:  Oral Oral   SpO2: 91% 96% 94%   Weight: (!) 148.9 kg (328 lb 4.2 oz)     Height:        Intake/Output Summary (Last 24 hours) at 01/22/2018 1839 Last data filed at 01/22/2018 1312 Gross per 24 hour  Intake 480 ml  Output  2250 ml  Net -1770 ml   Filed Weights   01/20/18 0542 01/21/18 0504 01/22/18 0504  Weight: (!) 148.5 kg (327 lb 6.1 oz) (!) 148 kg (326 lb 4.5 oz) (!) 148.9 kg (328 lb 4.2 oz)    Examination:  General exam: Alert, awake, oriented x 3, morbidly obese Respiratory system: Clear to auscultation. Respiratory effort normal. Cardiovascular system:RRR. No murmurs, rubs, gallops. Gastrointestinal system: Abdomen is nondistended, soft and nontender.  No organomegaly or masses felt. Normal bowel sounds heard.  Significant abdominal pannus with pitting edema Central nervous system: Alert and oriented. No focal neurological deficits. Extremities: No C/C 2+ pitting edema to mid calves bilaterally Skin: No rashes, lesions or ulcers Psychiatry: Judgement and insight appear normal. Mood & affect appropriate.         Data Reviewed: I have personally reviewed following labs and imaging studies  CBC: Recent Labs  Lab 01/17/18 1320  WBC 5.0  NEUTROABS 3.2  HGB 15.3*  HCT 47.3*  MCV 92.0  PLT 197   Basic Metabolic Panel: Recent Labs  Lab 01/17/18 1506 01/18/18 0604 01/19/18 0508 01/20/18 0642 01/21/18 0653 01/22/18 0611  NA  --  135 135 133* 133* 131*  K  --  3.6 3.2* 3.7 4.1 5.0  CL  --  92* 91* 90* 92* 91*  CO2  --  26 29 30  33* 31  GLUCOSE  --  73 100* 68 60* 76  BUN  --  20 21* 20 26* 30*  CREATININE  --  0.94 0.99 0.91 0.96 1.08*  CALCIUM  --  9.3 9.2 9.0 9.0 9.1  MG 2.3  --   --   --   --   --    GFR: Estimated Creatinine Clearance: 75.8 mL/min (A) (by C-G formula based on SCr of 1.08 mg/dL (H)). Liver Function Tests: Recent Labs  Lab 01/17/18 1320  AST 61*  ALT 18  ALKPHOS 165*  BILITOT 2.2*  PROT 6.6  ALBUMIN 3.0*   No results for input(s): LIPASE, AMYLASE in the last 168 hours. No results for input(s): AMMONIA in the last 168 hours. Coagulation Profile: No results for input(s): INR, PROTIME in the last 168 hours. Cardiac Enzymes: No results for input(s): CKTOTAL, CKMB, CKMBINDEX, TROPONINI in the last 168 hours. BNP (last 3 results) No results for input(s): PROBNP in the last 8760 hours. HbA1C: No results for input(s): HGBA1C in the last 72 hours. CBG: Recent Labs  Lab 01/21/18 1646 01/21/18 2147 01/22/18 0722 01/22/18 1110 01/22/18 1623  GLUCAP 116* 116* 76 131* 165*   Lipid Profile: No results for input(s): CHOL, HDL, LDLCALC, TRIG, CHOLHDL, LDLDIRECT in the last 72 hours. Thyroid  Function Tests: No results for input(s): TSH, T4TOTAL, FREET4, T3FREE, THYROIDAB in the last 72 hours. Anemia Panel: No results for input(s): VITAMINB12, FOLATE, FERRITIN, TIBC, IRON, RETICCTPCT in the last 72 hours. Urine analysis:    Component Value Date/Time   COLORURINE STRAW (A) 01/17/2018 1239   APPEARANCEUR CLEAR 01/17/2018 1239   LABSPEC 1.005 01/17/2018 1239   PHURINE 8.0 01/17/2018 1239   GLUCOSEU NEGATIVE 01/17/2018 1239   HGBUR NEGATIVE 01/17/2018 1239   BILIRUBINUR NEGATIVE 01/17/2018 1239   KETONESUR NEGATIVE 01/17/2018 1239   PROTEINUR NEGATIVE 01/17/2018 1239   NITRITE NEGATIVE 01/17/2018 1239   LEUKOCYTESUR NEGATIVE 01/17/2018 1239   Sepsis Labs: @LABRCNTIP (procalcitonin:4,lacticidven:4)  )No results found for this or any previous visit (from the past 240 hour(s)).       Radiology Studies: No results found.  Scheduled Meds: . acidophilus  1 capsule Oral Daily  . aspirin EC  81 mg Oral Daily  . carvedilol  6.25 mg Oral BID WC  . furosemide  80 mg Intravenous Q12H  . glimepiride  4 mg Oral QAC breakfast  . insulin aspart  0-9 Units Subcutaneous TID WC  . magnesium oxide  400 mg Oral Daily  . polycarbophil  625 mg Oral QID  . potassium chloride  40 mEq Oral BID  . sacubitril-valsartan  1 tablet Oral BID  . simvastatin  40 mg Oral QPM  . sodium chloride flush  3 mL Intravenous Q12H  . spironolactone  12.5 mg Oral Daily  . thyroid  15 mg Oral QAC breakfast   Continuous Infusions: . sodium chloride       LOS: 5 days    Time spent: 25 minutes.    Chaya Jan, MD [facility] Pager [phone]  If 7PM-7AM, please contact night-coverage [email or web address] Password [facility] 01/22/2018, 6:39 PM

## 2018-01-22 NOTE — Care Management Important Message (Signed)
Important Message  Patient Details  Name: Donna Graves MRN: [id number] Date of Birth: [birth year]   Medicare Important Message Given:  Yes    Renie Ora 01/22/2018, 11:30 AM

## 2018-01-23 LAB — GLUCOSE, CAPILLARY
GLUCOSE-CAPILLARY: 104 mg/dL — AB (ref 65–99)
Glucose-Capillary: 66 mg/dL (ref 65–99)
Glucose-Capillary: 124 mg/dL — ABNORMAL HIGH (ref 65–99)
Glucose-Capillary: 145 mg/dL — ABNORMAL HIGH (ref 65–99)
Glucose-Capillary: 133 mg/dL — ABNORMAL HIGH (ref 65–99)

## 2018-01-23 MED ORDER — POTASSIUM CHLORIDE CRYS ER 20 MEQ PO TBCR
40.0000 meq | EXTENDED_RELEASE_TABLET | Freq: Every day | ORAL | Status: DC
  Administered 2018-01-24 – 2018-01-30 (×7): 40 meq via ORAL
  Filled 2018-01-23 (×7): qty 2

## 2018-01-23 NOTE — Progress Notes (Signed)
Physical Therapy Treatment Patient Details Name: Donna Graves MRN: [id number] DOB: 09/04/1951 Today's Date: 01/23/2018    History of Present Illness Donna Graves is a 67 y.o. female with medical history significant of systolic congestive heart failure with last EF of 25%, morbid obesity, A. fib, pacemaker, diabetes, hypertension, hyperlipidemia comes into the emergency department after 2 weeks of weight gain over 12 pounds.  Patient reports swelling in her legs and her pannus.  She denies any fevers.  She reports that she complies with diet restrictions and salt intake.  She denies any nausea vomiting diarrhea.  Patient is referred for admission for acute congestive heart failure with fluid overload.  Her O2 sats are normal.  She denies any chest pain.    PT Comments    Patient presents seated at bedside with family members in room and agreeable for therapy.  Patient demonstrates good return for completing BLE strengthening/ROM exercises while seated at bedside, slightly labored cadence during gait training in hallway without loss of balance, limited secondary to fatigue and continued sitting up at bedside after therapy.  Patient will benefit from continued physical therapy in hospital and recommended venue below to increase strength, balance, endurance for safe ADLs and gait.    Follow Up Recommendations  Home health PT     Equipment Recommendations  None recommended by PT    Recommendations for Other Services       Precautions / Restrictions Precautions Precautions: Fall Restrictions Weight Bearing Restrictions: No    Mobility  Bed Mobility                  Transfers Overall transfer level: Needs assistance Equipment used: Rolling walker (2 wheeled) Transfers: Sit to/from Stand;Stand Pivot Transfers Sit to Stand: Modified independent (Device/Increase time) Stand pivot transfers: Modified independent (Device/Increase time)           Ambulation/Gait Ambulation/Gait assistance: Supervision Ambulation Distance (Feet): 45 Feet Assistive device: Rolling walker (2 wheeled) Gait Pattern/deviations: Decreased step length - right;Decreased step length - left;Decreased stride length Gait velocity: decreased   General Gait Details: demonstrates slow slightly labored cadence without loss of balance, limited mostly due to fatigue   Stairs             Wheelchair Mobility    Modified Rankin (Stroke Patients Only)       Balance Overall balance assessment: Needs assistance Sitting-balance support: Feet supported;No upper extremity supported Sitting balance-Leahy Scale: Good     Standing balance support: Bilateral upper extremity supported;During functional activity Standing balance-Leahy Scale: Fair                              Cognition Arousal/Alertness: Awake/alert Behavior During Therapy: WFL for tasks assessed/performed Overall Cognitive Status: Within Functional Limits for tasks assessed                                        Exercises General Exercises - Lower Extremity Long Arc Quad: Seated;AROM;Strengthening;Both;10 reps Hip Flexion/Marching: Seated;AROM;Strengthening;Both;10 reps Toe Raises: Seated;AROM;Strengthening;Both;10 reps Heel Raises: Seated;AROM;Strengthening;Both;10 reps    General Comments        Pertinent Vitals/Pain Pain Assessment: No/denies pain    Home Living                      Prior Function  PT Goals (current goals can now be found in the care plan section) Acute Rehab PT Goals Patient Stated Goal: return home PT Goal Formulation: With patient Time For Goal Achievement: 01/26/18 Potential to Achieve Goals: Good Progress towards PT goals: Progressing toward goals    Frequency    Min 3X/week      PT Plan Current plan remains appropriate    Co-evaluation              AM-PAC PT "6 Clicks" Daily  Activity  Outcome Measure  Difficulty turning over in bed (including adjusting bedclothes, sheets and blankets)?: None Difficulty moving from lying on back to sitting on the side of the bed? : None Difficulty sitting down on and standing up from a chair with arms (e.g., wheelchair, bedside commode, etc,.)?: None Help needed moving to and from a bed to chair (including a wheelchair)?: None Help needed walking in hospital room?: A Little Help needed climbing 3-5 steps with a railing? : A Little 6 Click Score: 22    End of Session   Activity Tolerance: Patient tolerated treatment well;Patient limited by fatigue Patient left: in bed;with call bell/phone within reach(seated at bedside) Nurse Communication: Mobility status PT Visit Diagnosis: Unsteadiness on feet (R26.81);Other abnormalities of gait and mobility (R26.89);Muscle weakness (generalized) (M62.81)     Time: 2761-4709 PT Time Calculation (min) (ACUTE ONLY): 25 min  Charges:  $Therapeutic Exercise: 8-22 mins $Therapeutic Activity: 8-22 mins                    G Codes:       12:22 PM, 02-09-2018 Ocie Bob, MPT Physical Therapist with [facility] [phone] office [phone] mobile phone

## 2018-01-23 NOTE — Plan of Care (Signed)
Progressing

## 2018-01-23 NOTE — Care Management Note (Signed)
Case Management Note  Patient Details  Name: Donna Graves MRN: [id number] Date of Birth: 24-Sep-1950  If discussed at Long Length of Stay Meetings, dates discussed:  01/23/18  Additional Comments:  Malcolm Metro, RN 01/23/2018, 12:05 PM

## 2018-01-23 NOTE — Progress Notes (Signed)
PROGRESS NOTE    Donna Graves  ZOX:[id number] DOB: 1951/08/03 DOA: 01/17/2018 PCP: Wilson Singer, MD     Brief Narrative:  67 year old woman admitted from home on 5/1 due to shortness of breath, well-known to [place] for frequent admissions due to congestive heart failure, admitted for same.   Assessment & Plan:   Principal Problem:   Acute on chronic systolic CHF (congestive heart failure) (HCC) Active Problems:   Type II diabetes mellitus (HCC)   AF (paroxysmal atrial fibrillation) (HCC)   Essential hypertension   CAD (coronary artery disease)   Morbid obesity with BMI of 50.0-59.9, adult (HCC)   Hypokalemia   Acute on chronic systolic heart failure -2D echo from January 2019 shows an ejection fraction of 25%. -She is symptomatically improved and has diuresed about 13.9 L since admission.  However, she still remains hypervolemic on exam. -We will continue current Lasix dose of 80 mg IV twice daily. -Renal function has started to increase today, if creatinine bumps again tomorrow, will plan to de-escalate Lasix. -She has had multiple hospitalizations for acute heart failure. -Carvedilol dose was increased to 6.25 mg twice daily on 5/2 and further increased to 12.5 mg twice daily on 5/4, plan to eventually titrate to maximally tolerated dose and aim for 25 mg twice daily as long as heart rate and blood pressure tolerate. SBP has been in the 90s to low 100s so unlikely we will be able to escalate dose any further. -Required dose reduction of Coreg down to 6.25 twice daily on 5/5 due to hypotension. -She was on low-dose ACE inhibitor 2.5 mg daily.  We have made the switch over to [facility] given 1A indication for systolic heart failure.  This dose will need to be slowly increased as renal function and blood pressure tolerates (probably not during this hospitalization).  Given that this also provides somewhat of a diuretic effect we will go ahead and discontinue  Zaroxolyn. -She is also on spironolactone 12.5 mg daily. -I reviewed heart failure diet with patient and patient's husband, I believe that she is adhering to this and she also states that she is compliant with her medications. -TSH is normal at 3.436.  Atrial fibrillation -Rate controlled, does not appear to be chronically anticoagulated.  Morbid obesity -Noted, counseled on diet and exercise.  Hypokalemia -Likely related to diuresis, continue to replace with oral supplementation. -Magnesium was normal at 2.3 -Potassium was 5.0 on 5/6; I have decreased daily supplementation from 40 BID to QD.  Essential hypertension -Well-controlled on current medications. Borderline low, in fact.  DM II -Hypoglycemic on 5/5. Amaryl held on that day. -Has not been hypoglycemic prior to this or since.   DVT prophylaxis: SCDs Code Status: Full code Family Communication: Husband at bedside updated on plan of care and all questions answered Disposition Plan: Anticipate discharge home once adequate diuresis, suspect at least 48 to 72 hours.  Consultants:   None  Procedures:   None  Antimicrobials:  Anti-infectives (From admission, onward)   None       Subjective: Anxious for DC home. Denies CP/SOB. Feels a little weak with ambulation. Would like a PT evaluation.  Objective: Vitals:   01/23/18 0503 01/23/18 0559 01/23/18 1008 01/23/18 1244  BP: (!) 123/102 105/60 100/66 (!) 91/42  Pulse: (!) 149 68 70 70  Resp:    20  Temp:    97.8 F (36.6 C)  TempSrc:    Oral  SpO2:    97%  Weight:  Marland Kitchen)  148.4 kg (327 lb 3.2 oz)    Height:        Intake/Output Summary (Last 24 hours) at 01/23/2018 1654 Last data filed at 01/23/2018 1439 Gross per 24 hour  Intake 1200 ml  Output 3550 ml  Net -2350 ml   Filed Weights   01/21/18 0504 01/22/18 0504 01/23/18 0559  Weight: (!) 148 kg (326 lb 4.5 oz) (!) 148.9 kg (328 lb 4.2 oz) (!) 148.4 kg (327 lb 3.2 oz)    Examination:  General exam:  Alert, awake, oriented x 3, morbidly obese. Respiratory system: Clear to auscultation. Respiratory effort normal. Cardiovascular system:RRR. No murmurs, rubs, gallops. Gastrointestinal system: Abdomen is nondistended, soft and nontender. No organomegaly or masses felt. Normal bowel sounds heard. Significant pannus. Central nervous system: Alert and oriented. No focal neurological deficits. Extremities: 2-3+ pitting edema bilaterally Skin: No rashes, lesions or ulcers Psychiatry: Judgement and insight appear normal. Mood & affect appropriate.          Data Reviewed: I have personally reviewed following labs and imaging studies  CBC: Recent Labs  Lab 01/17/18 1320  WBC 5.0  NEUTROABS 3.2  HGB 15.3*  HCT 47.3*  MCV 92.0  PLT 197   Basic Metabolic Panel: Recent Labs  Lab 01/17/18 1506 01/18/18 0604 01/19/18 0508 01/20/18 0642 01/21/18 0653 01/22/18 0611  NA  --  135 135 133* 133* 131*  K  --  3.6 3.2* 3.7 4.1 5.0  CL  --  92* 91* 90* 92* 91*  CO2  --  26 29 30  33* 31  GLUCOSE  --  73 100* 68 60* 76  BUN  --  20 21* 20 26* 30*  CREATININE  --  0.94 0.99 0.91 0.96 1.08*  CALCIUM  --  9.3 9.2 9.0 9.0 9.1  MG 2.3  --   --   --   --   --    GFR: Estimated Creatinine Clearance: 75.6 mL/min (A) (by C-G formula based on SCr of 1.08 mg/dL (H)). Liver Function Tests: Recent Labs  Lab 01/17/18 1320  AST 61*  ALT 18  ALKPHOS 165*  BILITOT 2.2*  PROT 6.6  ALBUMIN 3.0*   No results for input(s): LIPASE, AMYLASE in the last 168 hours. No results for input(s): AMMONIA in the last 168 hours. Coagulation Profile: No results for input(s): INR, PROTIME in the last 168 hours. Cardiac Enzymes: No results for input(s): CKTOTAL, CKMB, CKMBINDEX, TROPONINI in the last 168 hours. BNP (last 3 results) No results for input(s): PROBNP in the last 8760 hours. HbA1C: No results for input(s): HGBA1C in the last 72 hours. CBG: Recent Labs  Lab 01/22/18 2213 01/23/18 0754  01/23/18 0829 01/23/18 1120 01/23/18 1648  GLUCAP 90 66 104* 124* 145*   Lipid Profile: No results for input(s): CHOL, HDL, LDLCALC, TRIG, CHOLHDL, LDLDIRECT in the last 72 hours. Thyroid Function Tests: No results for input(s): TSH, T4TOTAL, FREET4, T3FREE, THYROIDAB in the last 72 hours. Anemia Panel: No results for input(s): VITAMINB12, FOLATE, FERRITIN, TIBC, IRON, RETICCTPCT in the last 72 hours. Urine analysis:    Component Value Date/Time   COLORURINE STRAW (A) 01/17/2018 1239   APPEARANCEUR CLEAR 01/17/2018 1239   LABSPEC 1.005 01/17/2018 1239   PHURINE 8.0 01/17/2018 1239   GLUCOSEU NEGATIVE 01/17/2018 1239   HGBUR NEGATIVE 01/17/2018 1239   BILIRUBINUR NEGATIVE 01/17/2018 1239   KETONESUR NEGATIVE 01/17/2018 1239   PROTEINUR NEGATIVE 01/17/2018 1239   NITRITE NEGATIVE 01/17/2018 1239   LEUKOCYTESUR NEGATIVE 01/17/2018 1239  Sepsis Labs: @LABRCNTIP (procalcitonin:4,lacticidven:4)  )No results found for this or any previous visit (from the past 240 hour(s)).       Radiology Studies: No results found.      Scheduled Meds: . acidophilus  1 capsule Oral Daily  . aspirin EC  81 mg Oral Daily  . carvedilol  6.25 mg Oral BID WC  . furosemide  80 mg Intravenous Q12H  . glimepiride  4 mg Oral QAC breakfast  . insulin aspart  0-9 Units Subcutaneous TID WC  . magnesium oxide  400 mg Oral Daily  . polycarbophil  625 mg Oral QID  . potassium chloride  40 mEq Oral BID  . sacubitril-valsartan  1 tablet Oral BID  . simvastatin  40 mg Oral QPM  . sodium chloride flush  3 mL Intravenous Q12H  . spironolactone  12.5 mg Oral Daily  . thyroid  15 mg Oral QAC breakfast   Continuous Infusions: . sodium chloride       LOS: 6 days    Time spent: 25 minutes.    Chaya Jan, MD [facility] Pager [phone]  If 7PM-7AM, please contact night-coverage [email or web address] Password [facility] 01/23/2018, 4:54 PM

## 2018-01-24 LAB — GLUCOSE, CAPILLARY
GLUCOSE-CAPILLARY: 102 mg/dL — AB (ref 65–99)
GLUCOSE-CAPILLARY: 162 mg/dL — AB (ref 65–99)
GLUCOSE-CAPILLARY: 184 mg/dL — AB (ref 65–99)
Glucose-Capillary: 69 mg/dL (ref 65–99)
Glucose-Capillary: 160 mg/dL — ABNORMAL HIGH (ref 65–99)

## 2018-01-24 LAB — BASIC METABOLIC PANEL
Anion gap: 9 (ref 5–15)
BUN: 25 mg/dL — AB (ref 6–20)
CO2: 31 mmol/L (ref 22–32)
Calcium: 9.3 mg/dL (ref 8.9–10.3)
Chloride: 97 mmol/L — ABNORMAL LOW (ref 101–111)
Creatinine, Ser: 0.92 mg/dL (ref 0.44–1.00)
GFR calc Af Amer: >60 mL/min (ref >60)
Glucose, Bld: 78 mg/dL (ref 65–99)
POTASSIUM: 4.6 mmol/L (ref 3.5–5.1)
SODIUM: 137 mmol/L (ref 135–145)

## 2018-01-24 MED ORDER — ALUM & MAG HYDROXIDE-SIMETH 200-200-20 MG/5ML PO SUSP
30.0000 mL | Freq: Once | ORAL | Status: AC
  Administered 2018-01-24: 30 mL via ORAL
  Filled 2018-01-24: qty 30

## 2018-01-24 MED ORDER — GLIMEPIRIDE 2 MG PO TABS
2.0000 mg | ORAL_TABLET | Freq: Every day | ORAL | Status: DC
  Administered 2018-01-25 – 2018-01-30 (×6): 2 mg via ORAL
  Filled 2018-01-24 (×6): qty 1

## 2018-01-24 NOTE — Progress Notes (Signed)
Physical Therapy Treatment Patient Details Name: JAMERA JAGO MRN: [id number] DOB: 02-08-1951 Today's Date: 01/24/2018    History of Present Illness SHONIA KELLERMANN is a 67 y.o. female with medical history significant of systolic congestive heart failure with last EF of 25%, morbid obesity, A. fib, pacemaker, diabetes, hypertension, hyperlipidemia comes into the emergency department after 2 weeks of weight gain over 12 pounds.  Patient reports swelling in her legs and her pannus.  She denies any fevers.  She reports that she complies with diet restrictions and salt intake.  She denies any nausea vomiting diarrhea.  Patient is referred for admission for acute congestive heart failure with fluid overload.  Her O2 sats are normal.  She denies any chest pain.    PT Comments    Pt initially refused treatment this morning as she was resting in bed.  Returned in evening and patient was sitting up in recliner with visitor present.  PT agreeable to participate with therapy.  Pt completed transfers independently with increased time due to weakness/swelling and deconditioned state.  Pt ambulated 140 feet with RW with some fatigue noted at end of ambulation, however no acute distress exhibited.  Pt returned to chair with visitor at end of session.               Mobility  Bed Mobility Overal bed mobility: Modified Independent                Transfers: Sit to/from Stand Sit to Stand: Modified independent (Device/Increase time)            Ambulation/Gait Ambulation/Gait assistance: Supervision Ambulation Distance (Feet): 140 Feet Assistive device: Rolling walker (2 wheeled) Gait Pattern/deviations: Decreased step length - right;Decreased step length - left;Decreased stride length;Trunk flexed Gait velocity: decreased   General Gait Details: demonstrates slow slightly labored cadence without loss of balance, limited mostly due to fatigue         Cognition Arousal/Alertness:  Awake/alert Behavior During Therapy: WFL for tasks assessed/performed Overall Cognitive Status: Within Functional Limits for tasks assessed                                               Pertinent Vitals/Pain Pain Assessment: No/denies pain           PT Goals (current goals can now be found in the care plan section) Progress towards PT goals: Progressing toward goals       PT Plan Current plan remains appropriate          End of Session Equipment Utilized During Treatment: Gait belt Activity Tolerance: Patient tolerated treatment well;Patient limited by fatigue Patient left: in chair;with call bell/phone within reach;with family/visitor present         Time: 1530-1550 PT Time Calculation (min) (ACUTE ONLY): 20 min  Charges:  $Gait Training: 8-22 mins                      Lurena Nida, PTA/CLT [phone]    Bascom Levels, Oliviah Agostini B 01/24/2018, 4:55 PM

## 2018-01-24 NOTE — Progress Notes (Addendum)
PROGRESS NOTE    Donna Graves  ZHY:[id number] DOB: 10/26/1950 DOA: 01/17/2018 PCP: Wilson Singer, MD     Brief Narrative:  67 year old woman admitted from home on 5/1 due to shortness of breath, well-known to [place] for frequent admissions due to congestive heart failure, admitted for same.   Assessment & Plan:   Principal Problem:   Acute on chronic systolic CHF (congestive heart failure) (HCC) Active Problems:   Type II diabetes mellitus (HCC)   AF (paroxysmal atrial fibrillation) (HCC)   Essential hypertension   CAD (coronary artery disease)   Morbid obesity with BMI of 50.0-59.9, adult (HCC)   Hypokalemia   Acute on chronic systolic heart failure -2D echo from January 2019 shows an ejection fraction of 25%. -She is symptomatically improved and has diuresed about 17.6 L since admission.  However, she still remains hypervolemic on exam. -We will continue current Lasix dose of 80 mg IV twice daily. -Renal function stable -She has had multiple hospitalizations for acute heart failure. -Carvedilol dose was increased to 6.25 mg twice daily on 5/2 and further increased to 12.5 mg twice daily on 5/4, plan to eventually titrate to maximally tolerated dose and aim for 25 mg twice daily as long as heart rate and blood pressure tolerate. SBP has been in the 90s to low 100s so unlikely we will be able to escalate dose any further. -Required dose reduction of Coreg down to 6.25 twice daily on 5/5 due to hypotension. -She was on low-dose ACE inhibitor 2.5 mg daily.  We have made the switch over to [facility] given 1A indication for systolic heart failure.  This dose will need to be slowly increased as renal function and blood pressure tolerates (probably not during this hospitalization).  Given that this also provides somewhat of a diuretic effect we will go ahead and discontinue Zaroxolyn. -She is also on spironolactone 12.5 mg daily. -TSH is normal at 3.436.  Atrial  fibrillation -Rate controlled, does not appear to be chronically anticoagulated.  Morbid obesity -Noted, counseled on diet and exercise.  Hypokalemia-resolved -Likely related to diuresis, continue to replace with oral supplementation.  Essential hypertension -Well-controlled on current medications. Borderline low, in fact.  DM II -Hypoglycemic on 5/5 and again this am. Amaryl dose from 4mg  to 2mg  today.   DVT prophylaxis: SCDs Code Status: Full code Family Communication: Husband at bedside updated on plan of care and all questions answered Disposition Plan: Anticipate discharge home once adequate diuresis, suspect at least 48 to 72 hours.  Consultants:   None  Procedures:   None  Antimicrobials:  Anti-infectives (From admission, onward)   None       Subjective: Patient seen and evaluated this morning.  She is feeling somewhat drowsy and no acute events have been noted overnight.  She continues to diuresis really well on IV Lasix with stable renal function noted.  Objective: Vitals:   01/23/18 1244 01/23/18 2021 01/23/18 2155 01/24/18 0442  BP: (!) 91/42  (!) 149/104 102/80  Pulse: 70  86 70  Resp: 20  18 13   Temp: 97.8 F (36.6 C)  97.9 F (36.6 C) 97.8 F (36.6 C)  TempSrc: Oral  Oral Oral  SpO2: 97% 94% 92% 92%  Weight:    (!) 147.3 kg (324 lb 11.8 oz)  Height:        Intake/Output Summary (Last 24 hours) at 01/24/2018 1212 Last data filed at 01/24/2018 0900 Gross per 24 hour  Intake 1083 ml  Output 4200  ml  Net -3117 ml   Filed Weights   01/22/18 0504 01/23/18 0559 01/24/18 0442  Weight: (!) 148.9 kg (328 lb 4.2 oz) (!) 148.4 kg (327 lb 3.2 oz) (!) 147.3 kg (324 lb 11.8 oz)    Examination:  General exam: Alert, awake, oriented x 3, morbidly obese. Respiratory system: Clear to auscultation. Respiratory effort normal. Cardiovascular system:RRR. No murmurs, rubs, gallops. Gastrointestinal system: Abdomen is nondistended, soft and nontender. No  organomegaly or masses felt. Normal bowel sounds heard. Significant pannus. Central nervous system: Alert and oriented. No focal neurological deficits. Extremities: 2-3+ pitting edema bilaterally Skin: No rashes, lesions or ulcers Psychiatry: Judgement and insight appear normal. Mood & affect appropriate.      Data Reviewed: I have personally reviewed following labs and imaging studies  CBC: Recent Labs  Lab 01/17/18 1320  WBC 5.0  NEUTROABS 3.2  HGB 15.3*  HCT 47.3*  MCV 92.0  PLT 197   Basic Metabolic Panel: Recent Labs  Lab 01/17/18 1506  01/19/18 0508 01/20/18 0642 01/21/18 0653 01/22/18 0611 01/24/18 0532  NA  --    < > 135 133* 133* 131* 137  K  --    < > 3.2* 3.7 4.1 5.0 4.6  CL  --    < > 91* 90* 92* 91* 97*  CO2  --    < > 29 30 33* 31 31  GLUCOSE  --    < > 100* 68 60* 76 78  BUN  --    < > 21* 20 26* 30* 25*  CREATININE  --    < > 0.99 0.91 0.96 1.08* 0.92  CALCIUM  --    < > 9.2 9.0 9.0 9.1 9.3  MG 2.3  --   --   --   --   --   --    < > = values in this interval not displayed.   GFR: Estimated Creatinine Clearance: 88.3 mL/min (by C-G formula based on SCr of 0.92 mg/dL). Liver Function Tests: Recent Labs  Lab 01/17/18 1320  AST 61*  ALT 18  ALKPHOS 165*  BILITOT 2.2*  PROT 6.6  ALBUMIN 3.0*   No results for input(s): LIPASE, AMYLASE in the last 168 hours. No results for input(s): AMMONIA in the last 168 hours. Coagulation Profile: No results for input(s): INR, PROTIME in the last 168 hours. Cardiac Enzymes: No results for input(s): CKTOTAL, CKMB, CKMBINDEX, TROPONINI in the last 168 hours. BNP (last 3 results) No results for input(s): PROBNP in the last 8760 hours. HbA1C: No results for input(s): HGBA1C in the last 72 hours. CBG: Recent Labs  Lab 01/23/18 1648 01/23/18 2153 01/24/18 0729 01/24/18 0758 01/24/18 1110  GLUCAP 145* 133* 69 102* 162*   Lipid Profile: No results for input(s): CHOL, HDL, LDLCALC, TRIG, CHOLHDL,  LDLDIRECT in the last 72 hours. Thyroid Function Tests: No results for input(s): TSH, T4TOTAL, FREET4, T3FREE, THYROIDAB in the last 72 hours. Anemia Panel: No results for input(s): VITAMINB12, FOLATE, FERRITIN, TIBC, IRON, RETICCTPCT in the last 72 hours. Urine analysis:    Component Value Date/Time   COLORURINE STRAW (A) 01/17/2018 1239   APPEARANCEUR CLEAR 01/17/2018 1239   LABSPEC 1.005 01/17/2018 1239   PHURINE 8.0 01/17/2018 1239   GLUCOSEU NEGATIVE 01/17/2018 1239   HGBUR NEGATIVE 01/17/2018 1239   BILIRUBINUR NEGATIVE 01/17/2018 1239   KETONESUR NEGATIVE 01/17/2018 1239   PROTEINUR NEGATIVE 01/17/2018 1239   NITRITE NEGATIVE 01/17/2018 1239   LEUKOCYTESUR NEGATIVE 01/17/2018 1239  Sepsis Labs: @LABRCNTIP (procalcitonin:4,lacticidven:4)  )No results found for this or any previous visit (from the past 240 hour(s)).    Radiology Studies: No results found.   Scheduled Meds: . acidophilus  1 capsule Oral Daily  . aspirin EC  81 mg Oral Daily  . carvedilol  6.25 mg Oral BID WC  . furosemide  80 mg Intravenous Q12H  . [START ON 01/25/2018] glimepiride  2 mg Oral QAC breakfast  . insulin aspart  0-9 Units Subcutaneous TID WC  . magnesium oxide  400 mg Oral Daily  . polycarbophil  625 mg Oral QID  . potassium chloride  40 mEq Oral Daily  . sacubitril-valsartan  1 tablet Oral BID  . simvastatin  40 mg Oral QPM  . sodium chloride flush  3 mL Intravenous Q12H  . spironolactone  12.5 mg Oral Daily  . thyroid  15 mg Oral QAC breakfast   Continuous Infusions: . sodium chloride       LOS: 7 days    Time spent: 25 minutes.    Pratik Hoover Brunette, DO [facility] Pager [phone]  If 7PM-7AM, please contact night-coverage [email or web address] Password [email or web address] 01/24/2018, 12:12 PM

## 2018-01-24 NOTE — Progress Notes (Signed)
Physical Therapy Note  Patient Details  Name: MAEZIE KOEHNE MRN: [id number] Date of Birth: May 12, 1951 Today's Date: 01/24/2018    Pt sleeping in bed.  Refused therapy this am; will check in pm time allowing  Lurena Nida, PTA/CLT [phone]    Bascom Levels, Gabbie Marzo B 01/24/2018, 9:42 AM

## 2018-01-25 ENCOUNTER — Inpatient Hospital Stay (HOSPITAL_COMMUNITY): Payer: Medicare Other

## 2018-01-25 LAB — GLUCOSE, CAPILLARY
GLUCOSE-CAPILLARY: 96 mg/dL (ref 65–99)
GLUCOSE-CAPILLARY: 65 mg/dL (ref 65–99)
GLUCOSE-CAPILLARY: 149 mg/dL — AB (ref 65–99)
Glucose-Capillary: 165 mg/dL — ABNORMAL HIGH (ref 65–99)
Glucose-Capillary: 153 mg/dL — ABNORMAL HIGH (ref 65–99)

## 2018-01-25 LAB — BASIC METABOLIC PANEL
ANION GAP: 12 (ref 5–15)
BUN: 20 mg/dL (ref 6–20)
CHLORIDE: 98 mmol/L — AB (ref 101–111)
CO2: 29 mmol/L (ref 22–32)
Calcium: 9.5 mg/dL (ref 8.9–10.3)
Creatinine, Ser: 0.87 mg/dL (ref 0.44–1.00)
GFR calc non Af Amer: >60 mL/min (ref >60)
Glucose, Bld: 98 mg/dL (ref 65–99)
POTASSIUM: 4 mmol/L (ref 3.5–5.1)
Sodium: 139 mmol/L (ref 135–145)

## 2018-01-25 LAB — CK: CK TOTAL: 250 U/L — AB (ref 38–234)

## 2018-01-25 LAB — MAGNESIUM: MAGNESIUM: 2 mg/dL (ref 1.7–2.4)

## 2018-01-25 MED ORDER — IBUPROFEN 800 MG PO TABS
800.0000 mg | ORAL_TABLET | Freq: Four times a day (QID) | ORAL | Status: DC | PRN
  Administered 2018-01-25 – 2018-01-28 (×5): 800 mg via ORAL
  Filled 2018-01-25 (×5): qty 1

## 2018-01-25 NOTE — Progress Notes (Signed)
PT Cancellation Note  Patient Details Name: Donna Graves MRN: [id number] DOB: May 05, 1951   Cancelled Treatment:    Reason Eval/Treat Not Completed: Pain limiting ability to participate.  Patient presents asleep and her spouse at bedside requested not to wake patient and hold therapy today secondary to patient not feeling well and having body aches.   3:35 PM, 01/25/18 Ocie Bob, MPT Physical Therapist with [facility] [phone] office [phone] mobile phone

## 2018-01-25 NOTE — Progress Notes (Signed)
PROGRESS NOTE    Donna Graves  ZOX:[id number] DOB: [birth year] DOA: 01/17/2018 PCP: Wilson Singer, MD     Brief Narrative:  67 year old woman admitted from home on 5/1 due to shortness of breath, well-known to [place] for frequent admissions due to congestive heart failure, admitted for same.   Assessment & Plan:   Principal Problem:   Acute on chronic systolic CHF (congestive heart failure) (HCC) Active Problems:   Type II diabetes mellitus (HCC)   AF (paroxysmal atrial fibrillation) (HCC)   Essential hypertension   CAD (coronary artery disease)   Morbid obesity with BMI of 50.0-59.9, adult (HCC)   Hypokalemia   Acute on chronic systolic heart failure-improving -2D echo from January 2019 shows an ejection fraction of 25%. -She is symptomatically improved and has diuresed about 17.6 L since admission.  However, she still remains hypervolemic on exam. -We will continue current Lasix dose of 80 mg IV twice daily. -Renal function stable on repeat labs -She has had multiple hospitalizations for acute heart failure. -Carvedilol dose was increased to 6.25 mg twice daily on 5/2 and further increased to 12.5 mg twice daily on 5/4, plan to eventually titrate to maximally tolerated dose and aim for 25 mg twice daily as long as heart rate and blood pressure tolerate. SBP has been in the 90s to low 100s so unlikely we will be able to escalate dose any further. -Required dose reduction of Coreg down to 6.25 twice daily on 5/5 due to hypotension. -She was on low-dose ACE inhibitor 2.5 mg daily.  We have made the switch over to [facility] given 1A indication for systolic heart failure.  This dose will need to be slowly increased as renal function and blood pressure tolerates (probably not during this hospitalization).  Given that this also provides somewhat of a diuretic effect we will go ahead and discontinue Zaroxolyn. -She is also on spironolactone 12.5 mg daily. -TSH is normal at  3.436.  Mild transient hypoxemia -She is currently on room air and does not have any acute respiratory distress, chest x-ray has been ordered for further evaluation.  Atrial fibrillation -Rate controlled, does not appear to be chronically anticoagulated.  Morbid obesity -Noted, counseled on diet and exercise.  Hypokalemia-resolved -Likely related to diuresis, continue to follow  Essential hypertension -Well-controlled on current medications. Borderline low, in fact.  DM II -Hypoglycemic on 5/5 and again this am. Amaryl dose from 4mg  to 2mg  on 5/8. -No further hypoglycemia episodes today.   DVT prophylaxis: SCDs Code Status: Full code Family Communication: Husband at bedside updated on plan of care and all questions answered Disposition Plan: Anticipate discharge home once adequate diuresis  Consultants:   None  Procedures:   None  Antimicrobials:  Anti-infectives (From admission, onward)   None       Subjective: Patient seen and evaluated this morning.  She is complaining of some body aches and was noted to have some mild hypoxemia this morning.  Objective: Vitals:   01/24/18 2039 01/24/18 2153 01/25/18 0612 01/25/18 0653  BP:  (!) 99/58 (!) 180/133 110/68  Pulse:  85 86 62  Resp:  20 20   Temp:  98.4 F (36.9 C) 97.7 F (36.5 C)   TempSrc:  Oral Oral   SpO2: 92% 92% (!) 85%   Weight:   66.7 kg (146 lb 15.4 oz)   Height:        Intake/Output Summary (Last 24 hours) at 01/25/2018 0951 Last data filed at 01/25/2018 0900  Gross per 24 hour  Intake 600 ml  Output 3800 ml  Net -3200 ml   Filed Weights   01/23/18 0559 01/24/18 0442 01/25/18 0612  Weight: (!) 148.4 kg (327 lb 3.2 oz) (!) 147.3 kg (324 lb 11.8 oz) 66.7 kg (146 lb 15.4 oz)    Examination:  General exam: Alert, awake, oriented x 3, morbidly obese. Respiratory system: Clear to auscultation. Respiratory effort normal. Cardiovascular system:RRR. No murmurs, rubs, gallops. Gastrointestinal  system: Abdomen is nondistended, soft and nontender. No organomegaly or masses felt. Normal bowel sounds heard. Significant pannus. Central nervous system: Alert and oriented. No focal neurological deficits. Extremities: 2-3+ pitting edema bilaterally Skin: No rashes, lesions or ulcers Psychiatry: Judgement and insight appear normal. Mood & affect appropriate.      Data Reviewed: I have personally reviewed following labs and imaging studies  CBC: No results for input(s): WBC, NEUTROABS, HGB, HCT, MCV, PLT in the last 168 hours. Basic Metabolic Panel: Recent Labs  Lab 01/20/18 0642 01/21/18 0653 01/22/18 0611 01/24/18 0532 01/25/18 0744  NA 133* 133* 131* 137 139  K 3.7 4.1 5.0 4.6 4.0  CL 90* 92* 91* 97* 98*  CO2 30 33* 31 31 29   GLUCOSE 68 60* 76 78 98  BUN 20 26* 30* 25* 20  CREATININE 0.91 0.96 1.08* 0.92 0.87  CALCIUM 9.0 9.0 9.1 9.3 9.5  MG  --   --   --   --  2.0   GFR: Estimated Creatinine Clearance: 60.6 mL/min (by C-G formula based on SCr of 0.87 mg/dL). Liver Function Tests: No results for input(s): AST, ALT, ALKPHOS, BILITOT, PROT, ALBUMIN in the last 168 hours. No results for input(s): LIPASE, AMYLASE in the last 168 hours. No results for input(s): AMMONIA in the last 168 hours. Coagulation Profile: No results for input(s): INR, PROTIME in the last 168 hours. Cardiac Enzymes: No results for input(s): CKTOTAL, CKMB, CKMBINDEX, TROPONINI in the last 168 hours. BNP (last 3 results) No results for input(s): PROBNP in the last 8760 hours. HbA1C: No results for input(s): HGBA1C in the last 72 hours. CBG: Recent Labs  Lab 01/24/18 0758 01/24/18 1110 01/24/18 1614 01/24/18 2148 01/25/18 0746  GLUCAP 102* 162* 160* 184* 96   Lipid Profile: No results for input(s): CHOL, HDL, LDLCALC, TRIG, CHOLHDL, LDLDIRECT in the last 72 hours. Thyroid Function Tests: No results for input(s): TSH, T4TOTAL, FREET4, T3FREE, THYROIDAB in the last 72 hours. Anemia  Panel: No results for input(s): VITAMINB12, FOLATE, FERRITIN, TIBC, IRON, RETICCTPCT in the last 72 hours. Urine analysis:    Component Value Date/Time   COLORURINE STRAW (A) 01/17/2018 1239   APPEARANCEUR CLEAR 01/17/2018 1239   LABSPEC 1.005 01/17/2018 1239   PHURINE 8.0 01/17/2018 1239   GLUCOSEU NEGATIVE 01/17/2018 1239   HGBUR NEGATIVE 01/17/2018 1239   BILIRUBINUR NEGATIVE 01/17/2018 1239   KETONESUR NEGATIVE 01/17/2018 1239   PROTEINUR NEGATIVE 01/17/2018 1239   NITRITE NEGATIVE 01/17/2018 1239   LEUKOCYTESUR NEGATIVE 01/17/2018 1239   Sepsis Labs: @LABRCNTIP (procalcitonin:4,lacticidven:4)  )No results found for this or any previous visit (from the past 240 hour(s)).    Radiology Studies: No results found.   Scheduled Meds: . acidophilus  1 capsule Oral Daily  . aspirin EC  81 mg Oral Daily  . carvedilol  6.25 mg Oral BID WC  . furosemide  80 mg Intravenous Q12H  . glimepiride  2 mg Oral QAC breakfast  . insulin aspart  0-9 Units Subcutaneous TID WC  . magnesium oxide  400  mg Oral Daily  . polycarbophil  625 mg Oral QID  . potassium chloride  40 mEq Oral Daily  . sacubitril-valsartan  1 tablet Oral BID  . simvastatin  40 mg Oral QPM  . sodium chloride flush  3 mL Intravenous Q12H  . spironolactone  12.5 mg Oral Daily  . thyroid  15 mg Oral QAC breakfast   Continuous Infusions: . sodium chloride       LOS: 8 days    Time spent: 25 minutes.    Tonya Wantz Hoover Brunette, DO [facility] Pager [phone]  If 7PM-7AM, please contact night-coverage [email or web address] Password [email or web address] 01/25/2018, 9:51 AM

## 2018-01-25 NOTE — Care Management Note (Signed)
Case Management Note  Patient Details  Name: Donna Graves MRN: [id number] Date of Birth: [birth year]  If discussed at Long Length of Stay Meetings, dates discussed:  01/25/2018  Additional Comments:  Malcolm Metro, RN 01/25/2018, 12:04 PM

## 2018-01-25 NOTE — Progress Notes (Signed)
Dressing to left distal left had been removed by the patient. Site cleansed with sterile normal saline and sterile gauze. Allevyn foam border was applied, dated and initialed. Patient tolerated well with no complaints.

## 2018-01-26 LAB — BASIC METABOLIC PANEL
Anion gap: 11 (ref 5–15)
BUN: 22 mg/dL — AB (ref 6–20)
CO2: 28 mmol/L (ref 22–32)
Calcium: 9.3 mg/dL (ref 8.9–10.3)
Chloride: 99 mmol/L — ABNORMAL LOW (ref 101–111)
Creatinine, Ser: 0.96 mg/dL (ref 0.44–1.00)
GFR calc Af Amer: >60 mL/min (ref >60)
GLUCOSE: 100 mg/dL — AB (ref 65–99)
Potassium: 4 mmol/L (ref 3.5–5.1)
Sodium: 138 mmol/L (ref 135–145)

## 2018-01-26 LAB — GLUCOSE, CAPILLARY
GLUCOSE-CAPILLARY: 125 mg/dL — AB (ref 65–99)
GLUCOSE-CAPILLARY: 122 mg/dL — AB (ref 65–99)
Glucose-Capillary: 75 mg/dL (ref 65–99)
Glucose-Capillary: 91 mg/dL (ref 65–99)
Glucose-Capillary: 156 mg/dL — ABNORMAL HIGH (ref 65–99)

## 2018-01-26 MED ORDER — SPIRONOLACTONE 12.5 MG HALF TABLET
12.5000 mg | ORAL_TABLET | Freq: Every day | ORAL | Status: DC
  Administered 2018-01-26 – 2018-01-30 (×5): 12.5 mg via ORAL
  Filled 2018-01-26 (×6): qty 1

## 2018-01-26 MED ORDER — FUROSEMIDE 10 MG/ML IJ SOLN
80.0000 mg | Freq: Two times a day (BID) | INTRAMUSCULAR | Status: DC
  Administered 2018-01-26 – 2018-01-30 (×8): 80 mg via INTRAVENOUS
  Filled 2018-01-26 (×8): qty 8

## 2018-01-26 NOTE — Plan of Care (Signed)
Decrease edema and weight loss noted since admission. Patient continues on daily weight and strict intake and output.

## 2018-01-26 NOTE — Progress Notes (Signed)
PROGRESS NOTE    Donna Graves  ZOX:[id number] DOB: 1950/12/16 DOA: 01/17/2018 PCP: Wilson Singer, MD     Brief Narrative:  67 year old woman admitted from home on 5/1 due to shortness of breath, well-known to [place] for frequent admissions due to congestive heart failure, admitted for same.   Assessment & Plan:   Principal Problem:   Acute on chronic systolic CHF (congestive heart failure) (HCC) Active Problems:   Type II diabetes mellitus (HCC)   AF (paroxysmal atrial fibrillation) (HCC)   Essential hypertension   CAD (coronary artery disease)   Morbid obesity with BMI of 50.0-59.9, adult (HCC)   Hypokalemia   Acute on chronic systolic heart failure-improving -2D echo from January 2019 shows an ejection fraction of 25%. -She is symptomatically improved and has diuresed about 23.6 L since admission.  However, she still remains hypervolemic on exam. Weight from 151 to 145kg so far since admission. -We will continue current Lasix dose of 80 mg IV twice daily. -Renal function stable on repeat labs -She has had multiple hospitalizations for acute heart failure. -Hold Cavedilol and Entresto due to soft BP readings today -She is also on spironolactone 12.5 mg daily. -TSH is normal at 3.436.  Mild transient hypoxemia -She is currently on room air and does not have any acute respiratory distress, chest x-ray has been ordered for further evaluation.  Atrial fibrillation -Rate controlled, does not appear to be chronically anticoagulated.  Morbid obesity -Noted, counseled on diet and exercise.  Hypokalemia-resolved -Likely related to diuresis, continue to follow  Essential hypertension -Well-controlled on current medications. Borderline low, in fact.  DM II -Hypoglycemic on 5/5 and again this am. Amaryl dose from 4mg  to 2mg  on 5/8. -No further hypoglycemia episodes today.   DVT prophylaxis: SCDs Code Status: Full code Family Communication: Husband at bedside updated  on plan of care and all questions answered Disposition Plan: Anticipate discharge home once adequate diuresis  Consultants:   None  Procedures:   None  Antimicrobials:  Anti-infectives (From admission, onward)   None       Subjective: Patient seen and evaluated this morning.  She says she no longer has any body aches and is feeling somewhat better this morning.  Her blood pressure readings are soft, but repeat evaluation at bedside yields a systolic in the 100 range.  Objective: Vitals:   01/25/18 2117 01/26/18 0500 01/26/18 0628 01/26/18 0642  BP: 90/62  (!) 83/41 (!) 88/60  Pulse:   71   Resp:   20   Temp:   (!) 97.5 F (36.4 C)   TempSrc:   Oral   SpO2:   94%   Weight:  (!) 145.6 kg (320 lb 15.8 oz)    Height:        Intake/Output Summary (Last 24 hours) at 01/26/2018 1210 Last data filed at 01/26/2018 0900 Gross per 24 hour  Intake 240 ml  Output 2500 ml  Net -2260 ml   Filed Weights   01/24/18 0442 01/25/18 0612 01/26/18 0500  Weight: (!) 147.3 kg (324 lb 11.8 oz) 66.7 kg (146 lb 15.4 oz) (!) 145.6 kg (320 lb 15.8 oz)    Examination:  General exam: Alert, awake, oriented x 3, morbidly obese. Respiratory system: Clear to auscultation. Respiratory effort normal. Cardiovascular system:RRR. No murmurs, rubs, gallops. Gastrointestinal system: Abdomen is nondistended, soft and nontender. No organomegaly or masses felt. Normal bowel sounds heard. Significant pannus. Central nervous system: Alert and oriented. No focal neurological deficits. Extremities: 2-3+ pitting  edema bilaterally Skin: No rashes, lesions or ulcers Psychiatry: Judgement and insight appear normal. Mood & affect appropriate.      Data Reviewed: I have personally reviewed following labs and imaging studies  CBC: No results for input(s): WBC, NEUTROABS, HGB, HCT, MCV, PLT in the last 168 hours. Basic Metabolic Panel: Recent Labs  Lab 01/21/18 0653 01/22/18 0611 01/24/18 0532  01/25/18 0744 01/26/18 0605  NA 133* 131* 137 139 138  K 4.1 5.0 4.6 4.0 4.0  CL 92* 91* 97* 98* 99*  CO2 33* 31 31 29 28   GLUCOSE 60* 76 78 98 100*  BUN 26* 30* 25* 20 22*  CREATININE 0.96 1.08* 0.92 0.87 0.96  CALCIUM 9.0 9.1 9.3 9.5 9.3  MG  --   --   --  2.0  --    GFR: Estimated Creatinine Clearance: 84 mL/min (by C-G formula based on SCr of 0.96 mg/dL). Liver Function Tests: No results for input(s): AST, ALT, ALKPHOS, BILITOT, PROT, ALBUMIN in the last 168 hours. No results for input(s): LIPASE, AMYLASE in the last 168 hours. No results for input(s): AMMONIA in the last 168 hours. Coagulation Profile: No results for input(s): INR, PROTIME in the last 168 hours. Cardiac Enzymes: Recent Labs  Lab 01/25/18 1437  CKTOTAL 250*   BNP (last 3 results) No results for input(s): PROBNP in the last 8760 hours. HbA1C: No results for input(s): HGBA1C in the last 72 hours. CBG: Recent Labs  Lab 01/25/18 1937 01/25/18 2109 01/26/18 0409 01/26/18 0740 01/26/18 1126  GLUCAP 153* 149* 75 91 156*   Lipid Profile: No results for input(s): CHOL, HDL, LDLCALC, TRIG, CHOLHDL, LDLDIRECT in the last 72 hours. Thyroid Function Tests: No results for input(s): TSH, T4TOTAL, FREET4, T3FREE, THYROIDAB in the last 72 hours. Anemia Panel: No results for input(s): VITAMINB12, FOLATE, FERRITIN, TIBC, IRON, RETICCTPCT in the last 72 hours. Urine analysis:    Component Value Date/Time   COLORURINE STRAW (A) 01/17/2018 1239   APPEARANCEUR CLEAR 01/17/2018 1239   LABSPEC 1.005 01/17/2018 1239   PHURINE 8.0 01/17/2018 1239   GLUCOSEU NEGATIVE 01/17/2018 1239   HGBUR NEGATIVE 01/17/2018 1239   BILIRUBINUR NEGATIVE 01/17/2018 1239   KETONESUR NEGATIVE 01/17/2018 1239   PROTEINUR NEGATIVE 01/17/2018 1239   NITRITE NEGATIVE 01/17/2018 1239   LEUKOCYTESUR NEGATIVE 01/17/2018 1239   Sepsis Labs: @LABRCNTIP (procalcitonin:4,lacticidven:4)  )No results found for this or any previous visit  (from the past 240 hour(s)).    Radiology Studies: Dg Chest Port 1 View  Result Date: 01/25/2018 CLINICAL DATA:  Hypoxemia EXAM: PORTABLE CHEST 1 VIEW COMPARISON:  01/17/2018 FINDINGS: Cardiac enlargement is stable. Defibrillator is again noted and stable. The overall inspiratory effort is slightly less than that seen on the prior exam. No focal confluent infiltrate or sizable effusion is seen. Mild vascular congestion remains accentuated by the poor inspiratory effort. No acute bony abnormality is noted. IMPRESSION: Mild vascular congestion somewhat accentuated by poor inspiratory effort. Electronically Signed   By: Alcide Clever M.D.   On: 01/25/2018 11:46     Scheduled Meds: . acidophilus  1 capsule Oral Daily  . aspirin EC  81 mg Oral Daily  . furosemide  80 mg Intravenous BID  . glimepiride  2 mg Oral QAC breakfast  . insulin aspart  0-9 Units Subcutaneous TID WC  . magnesium oxide  400 mg Oral Daily  . polycarbophil  625 mg Oral QID  . potassium chloride  40 mEq Oral Daily  . simvastatin  40 mg Oral  QPM  . sodium chloride flush  3 mL Intravenous Q12H  . spironolactone  12.5 mg Oral Daily  . thyroid  15 mg Oral QAC breakfast   Continuous Infusions: . sodium chloride       LOS: 9 days    Time spent: 25 minutes.    Toluwani Yadav Hoover Brunette, DO [facility] Pager [phone]  If 7PM-7AM, please contact night-coverage [email or web address] Password [email or web address] 01/26/2018, 12:10 PM

## 2018-01-26 NOTE — Progress Notes (Signed)
Physical Therapy Treatment Patient Details Name: Donna Graves MRN: [id number] DOB: Aug 19, 1951 Today's Date: 01/26/2018    History of Present Illness Donna Graves is a 67 y.o. female with medical history significant of systolic congestive heart failure with last EF of 25%, morbid obesity, A. fib, pacemaker, diabetes, hypertension, hyperlipidemia comes into the emergency department after 2 weeks of weight gain over 12 pounds.  Patient reports swelling in her legs and her pannus.  She denies any fevers.  She reports that she complies with diet restrictions and salt intake.  She denies any nausea vomiting diarrhea.  Patient is referred for admission for acute congestive heart failure with fluid overload.  Her O2 sats are normal.  She denies any chest pain.    PT Comments    Patient presents seated at bedside, agreeable to therapy and states she feels better today other than minor body aches.  Patient demonstrates slightly labored cadence with increased endurance/distance for ambulation, no loss of balance and mildly short of breath after walking.  Patient continued sitting up at bedside after therapy.  Patient will benefit from continued physical therapy in hospital and recommended venue below to increase strength, balance, endurance for safe ADLs and gait.    Follow Up Recommendations  Home health PT     Equipment Recommendations  None recommended by PT    Recommendations for Other Services       Precautions / Restrictions Precautions Precautions: Fall Restrictions Weight Bearing Restrictions: No    Mobility  Bed Mobility Overal bed mobility: Modified Independent                Transfers Overall transfer level: Modified independent Equipment used: Rolling walker (2 wheeled) Transfers: Sit to/from [employer] Sit to Stand: Modified independent (Device/Increase time) Stand pivot transfers: Modified independent (Device/Increase time)           Ambulation/Gait Ambulation/Gait assistance: Supervision Ambulation Distance (Feet): 150 Feet Assistive device: Rolling walker (2 wheeled) Gait Pattern/deviations: Decreased step length - right;Decreased step length - left;Decreased stride length;Trunk flexed Gait velocity: decreased   General Gait Details: slightly labored cadence with increased endurance/distance, no loss of balance, limited secondary to fatigue   Stairs             Wheelchair Mobility    Modified Rankin (Stroke Patients Only)       Balance Overall balance assessment: Needs assistance Sitting-balance support: Feet supported;No upper extremity supported Sitting balance-Leahy Scale: Good     Standing balance support: Bilateral upper extremity supported;During functional activity Standing balance-Leahy Scale: Fair                              Cognition Arousal/Alertness: Awake/alert Behavior During Therapy: WFL for tasks assessed/performed Overall Cognitive Status: Within Functional Limits for tasks assessed                                        Exercises General Exercises - Lower Extremity Long Arc Quad: Seated;AROM;Strengthening;Both;10 reps Hip Flexion/Marching: Seated;AROM;Strengthening;Both;10 reps Toe Raises: Seated;AROM;Strengthening;Both;10 reps Heel Raises: Seated;AROM;Strengthening;Both;10 reps    General Comments        Pertinent Vitals/Pain Pain Assessment: 0-10 Pain Score: 4  Pain Location: general body aches Pain Descriptors / Indicators: Aching;Discomfort Pain Intervention(s): Limited activity within patient's tolerance;Monitored during session    Home Living  Prior Function            PT Goals (current goals can now be found in the care plan section) Acute Rehab PT Goals Patient Stated Goal: return home PT Goal Formulation: With patient Time For Goal Achievement: 01/29/18 Potential to Achieve Goals:  Good Progress towards PT goals: Progressing toward goals    Frequency    Min 3X/week      PT Plan Current plan remains appropriate    Co-evaluation              AM-PAC PT "6 Clicks" Daily Activity  Outcome Measure  Difficulty turning over in bed (including adjusting bedclothes, sheets and blankets)?: None Difficulty moving from lying on back to sitting on the side of the bed? : None Difficulty sitting down on and standing up from a chair with arms (e.g., wheelchair, bedside commode, etc,.)?: None Help needed moving to and from a bed to chair (including a wheelchair)?: None Help needed walking in hospital room?: A Little Help needed climbing 3-5 steps with a railing? : A Little 6 Click Score: 22    End of Session   Activity Tolerance: Patient tolerated treatment well;Patient limited by fatigue Patient left: in bed;with call bell/phone within reach(seated at bedside) Nurse Communication: Mobility status PT Visit Diagnosis: Unsteadiness on feet (R26.81);Other abnormalities of gait and mobility (R26.89);Muscle weakness (generalized) (M62.81)     Time: 1610-9604 PT Time Calculation (min) (ACUTE ONLY): 27 min  Charges:  $Therapeutic Exercise: 8-22 mins $Therapeutic Activity: 8-22 mins                    G Codes:       3:23 PM, 02-23-18 Ocie Bob, MPT Physical Therapist with [facility] 336 [phone] office [phone] mobile phone

## 2018-01-26 NOTE — Care Management Important Message (Signed)
Important Message  Patient Details  Name: ALLISANDRA STONECYPHER MRN: [id number] Date of Birth: 1951/02/13   Medicare Important Message Given:  Yes    Malcolm Metro, RN 01/26/2018, 10:12 AM

## 2018-01-27 LAB — BASIC METABOLIC PANEL
ANION GAP: 10 (ref 5–15)
BUN: 21 mg/dL — ABNORMAL HIGH (ref 6–20)
CO2: 28 mmol/L (ref 22–32)
Calcium: 9.3 mg/dL (ref 8.9–10.3)
Chloride: 101 mmol/L (ref 101–111)
Creatinine, Ser: 0.94 mg/dL (ref 0.44–1.00)
GLUCOSE: 81 mg/dL (ref 65–99)
POTASSIUM: 4.1 mmol/L (ref 3.5–5.1)
SODIUM: 139 mmol/L (ref 135–145)

## 2018-01-27 LAB — GLUCOSE, CAPILLARY
GLUCOSE-CAPILLARY: 72 mg/dL (ref 65–99)
GLUCOSE-CAPILLARY: 171 mg/dL — AB (ref 65–99)
GLUCOSE-CAPILLARY: 115 mg/dL — AB (ref 65–99)
Glucose-Capillary: 118 mg/dL — ABNORMAL HIGH (ref 65–99)

## 2018-01-27 NOTE — Progress Notes (Signed)
PROGRESS NOTE    Donna Graves  [id number]:[id number] DOB: 07-Mar-1951 DOA: 01/17/2018 PCP: Wilson Singer, MD     Brief Narrative:  67 year old woman admitted from home on 5/1 due to shortness of breath, well-known to [place] for frequent admissions due to congestive heart failure, admitted for same.   Assessment & Plan:   Principal Problem:   Acute on chronic systolic CHF (congestive heart failure) (HCC) Active Problems:   Type II diabetes mellitus (HCC)   AF (paroxysmal atrial fibrillation) (HCC)   Essential hypertension   CAD (coronary artery disease)   Morbid obesity with BMI of 50.0-59.9, adult (HCC)   Hypokalemia   Acute on chronic systolic heart failure-improving -2D echo from January 2019 shows an ejection fraction of 25%. -She is symptomatically improved and has diuresed about 26.3 L since admission.  However, she still remains hypervolemic on exam. Weight from 151 to 145kg so far since admission. -We will continue current Lasix dose of 80 mg IV twice daily. -Renal function stable on repeat labs -She has had multiple hospitalizations for acute heart failure. -Continue to hold Cavedilol and Entresto due to soft BP readings today -She is also on spironolactone 12.5 mg daily. -TSH is normal at 3.436.  Mild transient hypoxemia-resolved -Chest x-ray on 5/9 demonstrated some mild pulmonary congestion, but she appears to continue to have good diuresis with Lasix.  Atrial fibrillation -Rate controlled, does not appear to be chronically anticoagulated.  Morbid obesity -Noted, counseled on diet and exercise.  Hypokalemia-resolved -Likely related to diuresis, continue to follow  Essential hypertension -Well-controlled on current medications. Borderline low, in fact.  DM II -Hypoglycemic on 5/5 and again this am. Amaryl dose from 4mg  to 2mg  on 5/8. -No further hypoglycemia episodes since this time.   DVT prophylaxis: SCDs Code Status: Full code Family Communication:  None currently at bedside. Disposition Plan: Anticipate discharge home once adequate diuresis achieved.  Consultants:   None  Procedures:   None  Antimicrobials:  Anti-infectives (From admission, onward)   None       Subjective: Patient seen and evaluated this morning.  She denies any complaints or concerns and is just getting eager to go home.  She understands that she requires ongoing diuresis however.  Objective: Vitals:   01/26/18 1820 01/26/18 2038 01/27/18 0456 01/27/18 0758  BP: 92/70 (!) 112/36 (!) 93/55   Pulse: (!) 101 69 70   Resp:      Temp:  98 F (36.7 C) 98.3 F (36.8 C)   TempSrc:  Oral Oral   SpO2:  94% (!) 89% 94%  Weight:   (!) 145 kg (319 lb 10.7 oz)   Height:        Intake/Output Summary (Last 24 hours) at 01/27/2018 1234 Last data filed at 01/27/2018 0020 Gross per 24 hour  Intake 360 ml  Output 2350 ml  Net -1990 ml   Filed Weights   01/25/18 0612 01/26/18 0500 01/27/18 0456  Weight: 66.7 kg (146 lb 15.4 oz) (!) 145.6 kg (320 lb 15.8 oz) (!) 145 kg (319 lb 10.7 oz)    Examination:  General exam: Alert, awake, oriented x 3, morbidly obese. Respiratory system: Clear to auscultation. Respiratory effort normal. Cardiovascular system:RRR. No murmurs, rubs, gallops. Gastrointestinal system: Abdomen is nondistended, soft and nontender. No organomegaly or masses felt. Normal bowel sounds heard. Significant pannus. Central nervous system: Alert and oriented. No focal neurological deficits. Extremities: 2-3+ pitting edema bilaterally Skin: No rashes, lesions or ulcers Psychiatry: Judgement and insight appear  normal. Mood & affect appropriate.      Data Reviewed: I have personally reviewed following labs and imaging studies  CBC: No results for input(s): WBC, NEUTROABS, HGB, HCT, MCV, PLT in the last 168 hours. Basic Metabolic Panel: Recent Labs  Lab 01/22/18 0611 01/24/18 0532 01/25/18 0744 01/26/18 0605 01/27/18 0510  NA 131* 137  139 138 139  K 5.0 4.6 4.0 4.0 4.1  CL 91* 97* 98* 99* 101  CO2 31 31 29 28 28   GLUCOSE 76 78 98 100* 81  BUN 30* 25* 20 22* 21*  CREATININE 1.08* 0.92 0.87 0.96 0.94  CALCIUM 9.1 9.3 9.5 9.3 9.3  MG  --   --  2.0  --   --    GFR: Estimated Creatinine Clearance: 85.5 mL/min (by C-G formula based on SCr of 0.94 mg/dL). Liver Function Tests: No results for input(s): AST, ALT, ALKPHOS, BILITOT, PROT, ALBUMIN in the last 168 hours. No results for input(s): LIPASE, AMYLASE in the last 168 hours. No results for input(s): AMMONIA in the last 168 hours. Coagulation Profile: No results for input(s): INR, PROTIME in the last 168 hours. Cardiac Enzymes: Recent Labs  Lab 01/25/18 1437  CKTOTAL 250*   BNP (last 3 results) No results for input(s): PROBNP in the last 8760 hours. HbA1C: No results for input(s): HGBA1C in the last 72 hours. CBG: Recent Labs  Lab 01/26/18 1126 01/26/18 1619 01/26/18 2116 01/27/18 0741 01/27/18 1142  GLUCAP 156* 125* 122* 72 171*   Lipid Profile: No results for input(s): CHOL, HDL, LDLCALC, TRIG, CHOLHDL, LDLDIRECT in the last 72 hours. Thyroid Function Tests: No results for input(s): TSH, T4TOTAL, FREET4, T3FREE, THYROIDAB in the last 72 hours. Anemia Panel: No results for input(s): VITAMINB12, FOLATE, FERRITIN, TIBC, IRON, RETICCTPCT in the last 72 hours. Urine analysis:    Component Value Date/Time   COLORURINE STRAW (A) 01/17/2018 1239   APPEARANCEUR CLEAR 01/17/2018 1239   LABSPEC 1.005 01/17/2018 1239   PHURINE 8.0 01/17/2018 1239   GLUCOSEU NEGATIVE 01/17/2018 1239   HGBUR NEGATIVE 01/17/2018 1239   BILIRUBINUR NEGATIVE 01/17/2018 1239   KETONESUR NEGATIVE 01/17/2018 1239   PROTEINUR NEGATIVE 01/17/2018 1239   NITRITE NEGATIVE 01/17/2018 1239   LEUKOCYTESUR NEGATIVE 01/17/2018 1239   Sepsis Labs: @LABRCNTIP (procalcitonin:4,lacticidven:4)  )No results found for this or any previous visit (from the past 240 hour(s)).    Radiology  Studies: No results found.   Scheduled Meds: . acidophilus  1 capsule Oral Daily  . aspirin EC  81 mg Oral Daily  . furosemide  80 mg Intravenous BID  . glimepiride  2 mg Oral QAC breakfast  . insulin aspart  0-9 Units Subcutaneous TID WC  . magnesium oxide  400 mg Oral Daily  . polycarbophil  625 mg Oral QID  . potassium chloride  40 mEq Oral Daily  . simvastatin  40 mg Oral QPM  . sodium chloride flush  3 mL Intravenous Q12H  . spironolactone  12.5 mg Oral Daily  . thyroid  15 mg Oral QAC breakfast   Continuous Infusions: . sodium chloride       LOS: 10 days    Time spent: 25 minutes.    Emorie Mcfate Hoover Brunette, DO [facility] Pager [phone]  If 7PM-7AM, please contact night-coverage [email or web address] Password [facility] 01/27/2018, 12:34 PM

## 2018-01-27 NOTE — Plan of Care (Signed)
progressing 

## 2018-01-28 LAB — BASIC METABOLIC PANEL
Anion gap: 9 (ref 5–15)
BUN: 19 mg/dL (ref 6–20)
CALCIUM: 8.8 mg/dL — AB (ref 8.9–10.3)
CO2: 27 mmol/L (ref 22–32)
CREATININE: 0.89 mg/dL (ref 0.44–1.00)
Chloride: 100 mmol/L — ABNORMAL LOW (ref 101–111)
GFR calc Af Amer: >60 mL/min (ref >60)
GLUCOSE: 76 mg/dL (ref 65–99)
Potassium: 3.8 mmol/L (ref 3.5–5.1)
SODIUM: 136 mmol/L (ref 135–145)

## 2018-01-28 LAB — GLUCOSE, CAPILLARY
Glucose-Capillary: 76 mg/dL (ref 65–99)
Glucose-Capillary: 145 mg/dL — ABNORMAL HIGH (ref 65–99)
Glucose-Capillary: 172 mg/dL — ABNORMAL HIGH (ref 65–99)
Glucose-Capillary: 118 mg/dL — ABNORMAL HIGH (ref 65–99)

## 2018-01-28 NOTE — Progress Notes (Signed)
PROGRESS NOTE    JULIEN BERRYMAN  GNF:[id number] DOB: [birth year] DOA: 01/17/2018 PCP: Wilson Singer, MD     Brief Narrative:  67 year old woman admitted from home on 5/1 due to shortness of breath, well-known to [place] for frequent admissions due to congestive heart failure, admitted for same.   Assessment & Plan:   Principal Problem:   Acute on chronic systolic CHF (congestive heart failure) (HCC) Active Problems:   Type II diabetes mellitus (HCC)   AF (paroxysmal atrial fibrillation) (HCC)   Essential hypertension   CAD (coronary artery disease)   Morbid obesity with BMI of 50.0-59.9, adult (HCC)   Hypokalemia   Acute on chronic systolic heart failure-improving -2D echo from January 2019 shows an ejection fraction of 25%. -She is symptomatically improved and has diuresed about 29.3 L since admission.  However, she still remains hypervolemic on exam. Weight from 151 to 142kg so far since admission. -We will continue current Lasix dose of 80 mg IV twice daily. -Renal function stable on repeat labs; check Mg in AM -She has had multiple hospitalizations for acute heart failure. -Continue to hold Cavedilol and Entresto due to soft BP readings today -She is also on spironolactone 12.5 mg daily. -TSH is normal at 3.436. -DC telemetry  Mild transient hypoxemia-resolved -Chest x-ray on 5/9 demonstrated some mild pulmonary congestion, but she appears to continue to have good diuresis with Lasix.  Atrial fibrillation -Rate controlled, does not appear to be chronically anticoagulated.  Morbid obesity -Noted, counseled on diet and exercise.  Hypokalemia-resolved -Likely related to diuresis, continue to follow  Essential hypertension -Well-controlled on current medications. Borderline low, in fact.  DM II -Hypoglycemic on 5/5 and again this am. Amaryl dose from 4mg  to 2mg  on 5/8. -No further hypoglycemia episodes since this time.   DVT prophylaxis: SCDs Code Status: Full  code Family Communication: None currently at bedside. Disposition Plan: Anticipate discharge home once adequate diuresis achieved.  Consultants:   None  Procedures:   None  Antimicrobials:  Anti-infectives (From admission, onward)   None       Subjective: Patient seen and evaluated this morning.  She denies any complaints or concerns this morning aside from some fatigue.  She did sleep well overnight.  Objective: Vitals:   01/27/18 1558 01/27/18 2105 01/28/18 0500 01/28/18 0625  BP: 120/65 (!) 90/49  (!) 105/56  Pulse: 71 69  72  Resp: 18   16  Temp:  98 F (36.7 C)  (!) 97.5 F (36.4 C)  TempSrc:  Oral  Oral  SpO2: 100% 96%  95%  Weight:   (!) 142 kg (313 lb 0.9 oz)   Height:        Intake/Output Summary (Last 24 hours) at 01/28/2018 1109 Last data filed at 01/28/2018 0900 Gross per 24 hour  Intake 723 ml  Output 1700 ml  Net -977 ml   Filed Weights   01/26/18 0500 01/27/18 0456 01/28/18 0500  Weight: (!) 145.6 kg (320 lb 15.8 oz) (!) 145 kg (319 lb 10.7 oz) (!) 142 kg (313 lb 0.9 oz)    Examination:  General exam: Alert, awake, oriented x 3, morbidly obese. Respiratory system: Clear to auscultation. Respiratory effort normal. Cardiovascular system:RRR. No murmurs, rubs, gallops. Gastrointestinal system: Abdomen is nondistended, soft and nontender. No organomegaly or masses felt. Normal bowel sounds heard. Significant pannus. Central nervous system: Alert and oriented. No focal neurological deficits. Extremities: 2-3+ pitting edema bilaterally Skin: No rashes, lesions or ulcers Psychiatry: Judgement and insight  appear normal. Mood & affect appropriate.      Data Reviewed: I have personally reviewed following labs and imaging studies  CBC: No results for input(s): WBC, NEUTROABS, HGB, HCT, MCV, PLT in the last 168 hours. Basic Metabolic Panel: Recent Labs  Lab 01/24/18 0532 01/25/18 0744 01/26/18 0605 01/27/18 0510 01/28/18 0642  NA 137 139 138  139 136  K 4.6 4.0 4.0 4.1 3.8  CL 97* 98* 99* 101 100*  CO2 31 29 28 28 27   GLUCOSE 78 98 100* 81 76  BUN 25* 20 22* 21* 19  CREATININE 0.92 0.87 0.96 0.94 0.89  CALCIUM 9.3 9.5 9.3 9.3 8.8*  MG  --  2.0  --   --   --    GFR: Estimated Creatinine Clearance: 89.1 mL/min (by C-G formula based on SCr of 0.89 mg/dL). Liver Function Tests: No results for input(s): AST, ALT, ALKPHOS, BILITOT, PROT, ALBUMIN in the last 168 hours. No results for input(s): LIPASE, AMYLASE in the last 168 hours. No results for input(s): AMMONIA in the last 168 hours. Coagulation Profile: No results for input(s): INR, PROTIME in the last 168 hours. Cardiac Enzymes: Recent Labs  Lab 01/25/18 1437  CKTOTAL 250*   BNP (last 3 results) No results for input(s): PROBNP in the last 8760 hours. HbA1C: No results for input(s): HGBA1C in the last 72 hours. CBG: Recent Labs  Lab 01/27/18 0741 01/27/18 1142 01/27/18 1701 01/27/18 2108 01/28/18 0727  GLUCAP 72 171* 115* 118* 76   Lipid Profile: No results for input(s): CHOL, HDL, LDLCALC, TRIG, CHOLHDL, LDLDIRECT in the last 72 hours. Thyroid Function Tests: No results for input(s): TSH, T4TOTAL, FREET4, T3FREE, THYROIDAB in the last 72 hours. Anemia Panel: No results for input(s): VITAMINB12, FOLATE, FERRITIN, TIBC, IRON, RETICCTPCT in the last 72 hours. Urine analysis:    Component Value Date/Time   COLORURINE STRAW (A) 01/17/2018 1239   APPEARANCEUR CLEAR 01/17/2018 1239   LABSPEC 1.005 01/17/2018 1239   PHURINE 8.0 01/17/2018 1239   GLUCOSEU NEGATIVE 01/17/2018 1239   HGBUR NEGATIVE 01/17/2018 1239   BILIRUBINUR NEGATIVE 01/17/2018 1239   KETONESUR NEGATIVE 01/17/2018 1239   PROTEINUR NEGATIVE 01/17/2018 1239   NITRITE NEGATIVE 01/17/2018 1239   LEUKOCYTESUR NEGATIVE 01/17/2018 1239   Sepsis Labs: @LABRCNTIP (procalcitonin:4,lacticidven:4)  )No results found for this or any previous visit (from the past 240 hour(s)).    Radiology  Studies: No results found.   Scheduled Meds: . acidophilus  1 capsule Oral Daily  . aspirin EC  81 mg Oral Daily  . furosemide  80 mg Intravenous BID  . glimepiride  2 mg Oral QAC breakfast  . insulin aspart  0-9 Units Subcutaneous TID WC  . magnesium oxide  400 mg Oral Daily  . polycarbophil  625 mg Oral QID  . potassium chloride  40 mEq Oral Daily  . simvastatin  40 mg Oral QPM  . sodium chloride flush  3 mL Intravenous Q12H  . spironolactone  12.5 mg Oral Daily  . thyroid  15 mg Oral QAC breakfast   Continuous Infusions: . sodium chloride       LOS: 11 days    Time spent: 25 minutes.    Illias Pantano Hoover Brunette, DO [facility] Pager [phone]  If 7PM-7AM, please contact night-coverage [email or web address] Password [facility] 01/28/2018, 11:09 AM

## 2018-01-28 NOTE — Plan of Care (Signed)
progressing 

## 2018-01-28 NOTE — Plan of Care (Signed)
Afebrile, iv site intact.

## 2018-01-29 LAB — GLUCOSE, CAPILLARY
GLUCOSE-CAPILLARY: 169 mg/dL — AB (ref 65–99)
Glucose-Capillary: 70 mg/dL (ref 65–99)
Glucose-Capillary: 139 mg/dL — ABNORMAL HIGH (ref 65–99)
Glucose-Capillary: 194 mg/dL — ABNORMAL HIGH (ref 65–99)
Glucose-Capillary: 95 mg/dL (ref 65–99)

## 2018-01-29 LAB — BASIC METABOLIC PANEL
ANION GAP: 10 (ref 5–15)
BUN: 20 mg/dL (ref 6–20)
CALCIUM: 8.7 mg/dL — AB (ref 8.9–10.3)
CO2: 27 mmol/L (ref 22–32)
Chloride: 98 mmol/L — ABNORMAL LOW (ref 101–111)
Creatinine, Ser: 0.81 mg/dL (ref 0.44–1.00)
GFR calc Af Amer: >60 mL/min (ref >60)
GFR calc non Af Amer: >60 mL/min (ref >60)
GLUCOSE: 84 mg/dL (ref 65–99)
POTASSIUM: 3.6 mmol/L (ref 3.5–5.1)
Sodium: 135 mmol/L (ref 135–145)

## 2018-01-29 LAB — MAGNESIUM: Magnesium: 1.9 mg/dL (ref 1.7–2.4)

## 2018-01-29 NOTE — Care Management Important Message (Signed)
Important Message  Patient Details  Name: Donna Graves MRN: [id number] Date of Birth: 08/09/1951   Medicare Important Message Given:  Yes    Renie Ora 01/29/2018, 1:39 PM

## 2018-01-29 NOTE — Progress Notes (Signed)
PROGRESS NOTE    Donna Graves  XKG:[id number] DOB: 26-Sep-1950 DOA: 01/17/2018 PCP: Wilson Singer, MD     Brief Narrative:  67 year old woman admitted from home on 5/1 due to shortness of breath, well-known to [place] for frequent admissions due to congestive heart failure, admitted for same.   Assessment & Plan:   Principal Problem:   Acute on chronic systolic CHF (congestive heart failure) (HCC) Active Problems:   Type II diabetes mellitus (HCC)   AF (paroxysmal atrial fibrillation) (HCC)   Essential hypertension   CAD (coronary artery disease)   Morbid obesity with BMI of 50.0-59.9, adult (HCC)   Hypokalemia   Acute on chronic systolic heart failure-improving -2D echo from January 2019 shows an ejection fraction of 25%. -She is symptomatically improved and has diuresed about 32.3 L since admission.  However, she still remains hypervolemic on exam. Weight from 151 to 145kg so far since admission. -We will continue current Lasix dose of 80 mg IV twice daily. -Renal function stable on repeat labs -She has had multiple hospitalizations for acute heart failure. -Continue to hold Cavedilol and Entresto due to soft BP readings today -She is also on spironolactone 12.5 mg daily. -TSH is normal at 3.436.  Mild transient hypoxemia-resolved -Chest x-ray on 5/9 demonstrated some mild pulmonary congestion, but she appears to continue to have good diuresis with Lasix.  Atrial fibrillation -Rate controlled, does not appear to be chronically anticoagulated.  Morbid obesity -Noted, counseled on diet and exercise.  Hypokalemia-resolved -Likely related to diuresis, continue to follow  Essential hypertension -Well-controlled on current medications.  -Consider restarting Carvedilol and Entresto if BP remains in current range by AM  DM II -Hypoglycemic on 5/5 and again this am. Amaryl dose from 4mg  to 2mg  on 5/8. -No further hypoglycemia episodes since this time.   DVT  prophylaxis: SCDs Code Status: Full code Family Communication: None currently at bedside. Disposition Plan: Anticipate discharge home once adequate diuresis achieved.  Consultants:   None  Procedures:   None  Antimicrobials:  Anti-infectives (From admission, onward)   None       Subjective: Patient seen and evaluated this morning.  She denies any complaints or concerns this AM.  Objective: Vitals:   01/28/18 1356 01/28/18 1539 01/28/18 2027 01/29/18 0435  BP:  111/74 106/68 (!) 125/50  Pulse:  67 72 72  Resp:  18 18 18   Temp:  98 F (36.7 C) 98.3 F (36.8 C) 97.7 F (36.5 C)  TempSrc:  Oral Oral Oral  SpO2: 95% 96% 95% 90%  Weight:    (!) 145.4 kg (320 lb 8.8 oz)  Height:        Intake/Output Summary (Last 24 hours) at 01/29/2018 1206 Last data filed at 01/29/2018 0700 Gross per 24 hour  Intake 780 ml  Output 3000 ml  Net -2220 ml   Filed Weights   01/27/18 0456 01/28/18 0500 01/29/18 0435  Weight: (!) 145 kg (319 lb 10.7 oz) (!) 142 kg (313 lb 0.9 oz) (!) 145.4 kg (320 lb 8.8 oz)    Examination:  General exam: Alert, awake, oriented x 3, morbidly obese. Respiratory system: Clear to auscultation. Respiratory effort normal. Cardiovascular system:RRR. No murmurs, rubs, gallops. Gastrointestinal system: Abdomen is nondistended, soft and nontender. No organomegaly or masses felt. Normal bowel sounds heard. Significant pannus. Central nervous system: Alert and oriented. No focal neurological deficits. Extremities: 2-3+ pitting edema bilaterally Skin: No rashes, lesions or ulcers Psychiatry: Judgement and insight appear normal. Mood & affect  appropriate.    Data Reviewed: I have personally reviewed following labs and imaging studies  CBC: No results for input(s): WBC, NEUTROABS, HGB, HCT, MCV, PLT in the last 168 hours. Basic Metabolic Panel: Recent Labs  Lab 01/25/18 0744 01/26/18 0605 01/27/18 0510 01/28/18 0642 01/29/18 0503  NA 139 138 139 136  135  K 4.0 4.0 4.1 3.8 3.6  CL 98* 99* 101 100* 98*  CO2 29 28 28 27 27   GLUCOSE 98 100* 81 76 84  BUN 20 22* 21* 19 20  CREATININE 0.87 0.96 0.94 0.89 0.81  CALCIUM 9.5 9.3 9.3 8.8* 8.7*  MG 2.0  --   --   --  1.9   GFR: Estimated Creatinine Clearance: 99.5 mL/min (by C-G formula based on SCr of 0.81 mg/dL). Liver Function Tests: No results for input(s): AST, ALT, ALKPHOS, BILITOT, PROT, ALBUMIN in the last 168 hours. No results for input(s): LIPASE, AMYLASE in the last 168 hours. No results for input(s): AMMONIA in the last 168 hours. Coagulation Profile: No results for input(s): INR, PROTIME in the last 168 hours. Cardiac Enzymes: Recent Labs  Lab 01/25/18 1437  CKTOTAL 250*   BNP (last 3 results) No results for input(s): PROBNP in the last 8760 hours. HbA1C: No results for input(s): HGBA1C in the last 72 hours. CBG: Recent Labs  Lab 01/28/18 1610 01/28/18 2034 01/29/18 0718 01/29/18 0818 01/29/18 1202  GLUCAP 172* 118* 70 169* 139*   Lipid Profile: No results for input(s): CHOL, HDL, LDLCALC, TRIG, CHOLHDL, LDLDIRECT in the last 72 hours. Thyroid Function Tests: No results for input(s): TSH, T4TOTAL, FREET4, T3FREE, THYROIDAB in the last 72 hours. Anemia Panel: No results for input(s): VITAMINB12, FOLATE, FERRITIN, TIBC, IRON, RETICCTPCT in the last 72 hours. Urine analysis:    Component Value Date/Time   COLORURINE STRAW (A) 01/17/2018 1239   APPEARANCEUR CLEAR 01/17/2018 1239   LABSPEC 1.005 01/17/2018 1239   PHURINE 8.0 01/17/2018 1239   GLUCOSEU NEGATIVE 01/17/2018 1239   HGBUR NEGATIVE 01/17/2018 1239   BILIRUBINUR NEGATIVE 01/17/2018 1239   KETONESUR NEGATIVE 01/17/2018 1239   PROTEINUR NEGATIVE 01/17/2018 1239   NITRITE NEGATIVE 01/17/2018 1239   LEUKOCYTESUR NEGATIVE 01/17/2018 1239   Sepsis Labs: @LABRCNTIP (procalcitonin:4,lacticidven:4)  )No results found for this or any previous visit (from the past 240 hour(s)).    Radiology  Studies: No results found.   Scheduled Meds: . acidophilus  1 capsule Oral Daily  . aspirin EC  81 mg Oral Daily  . furosemide  80 mg Intravenous BID  . glimepiride  2 mg Oral QAC breakfast  . insulin aspart  0-9 Units Subcutaneous TID WC  . magnesium oxide  400 mg Oral Daily  . polycarbophil  625 mg Oral QID  . potassium chloride  40 mEq Oral Daily  . simvastatin  40 mg Oral QPM  . sodium chloride flush  3 mL Intravenous Q12H  . spironolactone  12.5 mg Oral Daily  . thyroid  15 mg Oral QAC breakfast   Continuous Infusions: . sodium chloride       LOS: 12 days    Time spent: 25 minutes.    Pratik Hoover Brunette, DO [facility] Pager [phone]  If 7PM-7AM, please contact night-coverage [email or web address] Password [facility] 01/29/2018, 12:06 PM

## 2018-01-30 ENCOUNTER — Encounter (HOSPITAL_COMMUNITY): Payer: Self-pay | Admitting: Student

## 2018-01-30 DIAGNOSIS — I5023 Acute on chronic systolic (congestive) heart failure: Secondary | ICD-10-CM

## 2018-01-30 LAB — GLUCOSE, CAPILLARY
GLUCOSE-CAPILLARY: 83 mg/dL (ref 65–99)
Glucose-Capillary: 153 mg/dL — ABNORMAL HIGH (ref 65–99)

## 2018-01-30 LAB — BASIC METABOLIC PANEL
ANION GAP: 11 (ref 5–15)
BUN: 19 mg/dL (ref 6–20)
CHLORIDE: 99 mmol/L — AB (ref 101–111)
CO2: 26 mmol/L (ref 22–32)
Calcium: 8.7 mg/dL — ABNORMAL LOW (ref 8.9–10.3)
Creatinine, Ser: 0.74 mg/dL (ref 0.44–1.00)
Glucose, Bld: 99 mg/dL (ref 65–99)
POTASSIUM: 4.1 mmol/L (ref 3.5–5.1)
SODIUM: 136 mmol/L (ref 135–145)

## 2018-01-30 MED ORDER — SACUBITRIL-VALSARTAN 24-26 MG PO TABS: 1.0000 | ORAL_TABLET | Freq: Two times a day (BID) | ORAL | 0 refills | Status: AC

## 2018-01-30 MED ORDER — METOLAZONE 5 MG PO TABS: 2.5000 mg | ORAL_TABLET | ORAL | Status: DC

## 2018-01-30 MED ORDER — CARVEDILOL 3.125 MG PO TABS
3.1250 mg | ORAL_TABLET | Freq: Two times a day (BID) | ORAL | Status: DC
  Administered 2018-01-30: 3.125 mg via ORAL
  Filled 2018-01-30: qty 1

## 2018-01-30 MED ORDER — SACUBITRIL-VALSARTAN 24-26 MG PO TABS
1.0000 | ORAL_TABLET | Freq: Two times a day (BID) | ORAL | Status: DC
  Administered 2018-01-30: 1 via ORAL
  Filled 2018-01-30 (×3): qty 1

## 2018-01-30 MED ORDER — TORSEMIDE 20 MG PO TABS: 80.0000 mg | ORAL_TABLET | Freq: Two times a day (BID) | ORAL | Status: DC

## 2018-01-30 NOTE — Care Management Note (Signed)
Case Management Note  Patient Details  Name: MATHEW COSCARELLI MRN: [id number] Date of Birth: [birth year]  Expected Discharge Date:  01/30/18               Expected Discharge Plan:  Home w Home Health Services  In-House Referral:  NA  Discharge planning Services  CM Consult  Post Acute Care Choice:  Resumption of Svcs/PTA Provider, Home Health Choice offered to:  Patient  DME Arranged:    DME Agency:     HH Arranged:  RN, PT HH Agency:  [facility]  Status of Service:  Completed, signed off  If discussed at Long Length of Stay Meetings, dates discussed:  01/30/2018  Additional Comments: DC home today with resumption of HH services. Kipp Brood, [facility] rep, aware of DC today and will schedule pt to be seen in next 48 hrs, pt aware. CM will fax DC summary and resumption orders once available.   Malcolm Metro, RN 01/30/2018, 11:55 AM

## 2018-01-30 NOTE — Discharge Summary (Signed)
Physician Discharge Summary  BROOKIE WAYMENT Graves:[id number] DOB: 02/13/1951 DOA: 01/17/2018  PCP: Wilson Singer, MD  Admit date: 01/17/2018  Discharge date: 01/30/2018  Admitted From:Home  Disposition:  Home  Recommendations for Outpatient Follow-up:  1. Follow up with PCP in 1-2 weeks 2. Follow up with advanced heart failure clinic as recommended by Cardiology  Home Health:None  Equipment/Devices:None  Discharge Condition:Stable  CODE STATUS: Full  Diet recommendation: Heart Healthy/Carb Modified  Brief/Interim Summary:  This is a 67 year old Donna Graves who was admitted on 5/1 due to worsening shortness of breath and volume overload.  She has frequent admissions for CHF and was admitted with acute on chronic systolic heart failure with EF 25%.  She has diuresed approximately 38 L since admission and her weight has decreased from 151 to approximately 146 kg on this day of discharge.  She has steadily diuresed approximately 3 to 4 L of fluid per measurements on a daily basis and has had incremental improvement in her symptoms.  She continues to have repeat basic metabolic panel with stability.  She has been seen by cardiology Dr. Diona Browner today with recommendations to discharge on home diuretic medications and follow-up with advanced heart failure clinic in the near future.  Discharge Diagnoses:  Principal Problem:   Acute on chronic systolic CHF (congestive heart failure) (HCC) Active Problems:   Type II diabetes mellitus (HCC)   AF (paroxysmal atrial fibrillation) (HCC)   Essential hypertension   CAD (coronary artery disease)   Morbid obesity with BMI of 50.0-59.9, adult (HCC)   Hypokalemia  Acute on chronic systolic heart failure-improving -2D echo from January 2019 shows an ejection fraction of 25%. -She is symptomatically improved and has diuresed about 38 L since admission.    Continue home diuresis with spironolactone, Zaroxolyn, and torsemide twice daily as prescribed  below - She will follow-up with advanced heart failure clinic in the near future  Mild transient hypoxemia-resolved -Chest x-ray on 5/9 demonstrated some mild pulmonary congestion, but she appears to continue to have good diuresis with Lasix.  PAT with pacemaker -Rate controlled  Morbid obesity -Noted, counseled on diet and exercise.  Hypokalemia-resolved  Essential hypertension -Resume home medications as previously and Entresto added  HLD -Continue statin  DM II -Continue home medications as previously.   Discharge Instructions  Discharge Instructions    (HEART FAILURE PATIENTS) Call MD:  Anytime you have any of the following symptoms: 1) 3 pound weight gain in 24 hours or 5 pounds in 1 week 2) shortness of breath, with or without a dry hacking cough 3) swelling in the hands, feet or stomach 4) if you have to sleep on extra pillows at night in order to breathe.   Complete by:  As directed    Diet - low sodium heart healthy   Complete by:  As directed    Increase activity slowly   Complete by:  As directed      Allergies as of 01/30/2018      Reactions   Codeine Rash      Medication List    TAKE these medications   acetaminophen 650 MG CR tablet Commonly known as:  TYLENOL Take 650-1,300 mg by mouth every 8 (eight) hours as needed for pain.   acidophilus Caps capsule Take 1 capsule by mouth daily. ULTIMATE FLORA PROBIOTIC   ALIVE ONCE DAILY WOMENS 50+ PO Take 1 tablet by mouth daily.   ARMOUR THYROID 15 MG tablet Generic drug:  thyroid Take 1 tablet by mouth  daily.   aspirin 81 MG EC tablet Take 1 tablet (81 mg total) by mouth daily.   carvedilol 3.125 MG tablet Commonly known as:  COREG Take 1 tablet (3.125 mg total) by mouth 2 (two) times daily with a meal.   dicyclomine 10 MG capsule Commonly known as:  BENTYL Take 10 mg by mouth 3 (three) times daily as needed for spasms.   escitalopram 5 MG tablet Commonly known as:  LEXAPRO Take 1  tablet by mouth daily.   glimepiride 2 MG tablet Commonly known as:  AMARYL Take 4 mg by mouth daily. BEFORE A MEAL   lisinopril 2.5 MG tablet Commonly known as:  PRINIVIL,ZESTRIL Take 1 tablet (2.5 mg total) by mouth daily.   magnesium oxide 400 MG tablet Commonly known as:  MAG-OX Take 400 mg by mouth daily.   metolazone 2.5 MG tablet Commonly known as:  ZAROXOLYN Take one tablet on Monday and Thursdays.   omeprazole 20 MG capsule Commonly known as:  PRILOSEC Take 20 mg by mouth every morning.   polycarbophil 625 MG tablet Commonly known as:  FIBERCON Take 625 mg by mouth 4 (four) times daily.   potassium chloride SA 20 MEQ tablet Commonly known as:  K-DUR,KLOR-CON Take 2 tablets (40 mEq total) by mouth daily.   sacubitril-valsartan 24-26 MG Commonly known as:  ENTRESTO Take 1 tablet by mouth 2 (two) times daily.   simvastatin 40 MG tablet Commonly known as:  ZOCOR Take 40 mg by mouth every evening.   spironolactone 25 MG tablet Commonly known as:  ALDACTONE Take 0.5 tablets (12.5 mg total) by mouth daily.   torsemide 20 MG tablet Commonly known as:  DEMADEX Take 4 tablets (80 mg total) by mouth 2 (two) times daily.   VITAMIN D PO Take 7,000 Units by mouth daily.      Follow-up Information    Bensimhon, Bevelyn Buckles, MD Follow up.   Specialty:  Cardiology Why:  The office will contact you within 2-3 days to arrange Cardiology follow-up with the [facility]. If you do not hear from them within that time-frame, please call the number provided.  Contact information: [address] [place] [address] [phone]        Wilson Singer, MD Follow up in 1 week(s).   Specialty:  Internal Medicine Contact information: [address] [place] [address] [phone]        Antoine Poche, MD .   Specialty:  Cardiology Contact information: [address] [place] [address] [phone]           Allergies  Allergen Reactions  . Codeine Rash    Consultations:  Cardiology Dr. Diona Browner   Procedures/Studies: Ct Angio Chest Pe W And/or Wo Contrast  Result Date: 01/17/2018 CLINICAL DATA:  Bradycardia, edema and weight gain. EXAM: CT ANGIOGRAPHY CHEST WITH CONTRAST TECHNIQUE: Multidetector CT imaging of the chest was performed using the standard protocol during bolus administration of intravenous contrast. Multiplanar CT image reconstructions and MIPs were obtained to evaluate the vascular anatomy. CONTRAST:  ISOVUE-370 IOPAMIDOL (ISOVUE-370) INJECTION 76% COMPARISON:  None. FINDINGS: Cardiovascular: No evidence of pulmonary embolism with somewhat limited opacification at the subsegmental level. The central pulmonary arteries are not dilated. The heart is significantly enlarged with enlargement all 4 chambers. The right atrium is particularly enlarged and there is reflux of contrast into a markedly distended intrahepatic IVC and hepatic veins consistent with elevated right heart pressures and  right heart failure. No pericardial fluid is identified. The thoracic aorta is normal in caliber. Mediastinum/Nodes: No enlarged mediastinal, hilar, or axillary lymph nodes. Thyroid gland, trachea, and esophagus demonstrate no significant findings. Lungs/Pleura: Lungs show pulmonary venous hypertension and potentially mild interstitial edema without overt airspace edema. There is a trace amount of right pleural fluid at the lung base. No pulmonary consolidation, pneumothorax or pulmonary masses identified. Upper Abdomen: No acute abnormality. Musculoskeletal: No chest wall abnormality. No acute or significant osseous findings. Review of the MIP images confirms the above findings. IMPRESSION: 1. No evidence of pulmonary embolism. 2. Significant cardiac enlargement and evidence of elevated right heart pressures and right heart failure with marked dilatation of the right atrium and reflux into  markedly distended intrahepatic IVC and hepatic veins. 3. Pulmonary venous hypertensive changes in the lungs with potential mild interstitial edema. No overt airspace edema. Trace right pleural fluid. Electronically Signed   By: Irish Lack M.D.   On: 01/17/2018 16:46   Dg Chest Port 1 View  Result Date: 01/25/2018 CLINICAL DATA:  Hypoxemia EXAM: PORTABLE CHEST 1 VIEW COMPARISON:  01/17/2018 FINDINGS: Cardiac enlargement is stable. Defibrillator is again noted and stable. The overall inspiratory effort is slightly less than that seen on the prior exam. No focal confluent infiltrate or sizable effusion is seen. Mild vascular congestion remains accentuated by the poor inspiratory effort. No acute bony abnormality is noted. IMPRESSION: Mild vascular congestion somewhat accentuated by poor inspiratory effort. Electronically Signed   By: Alcide Clever M.D.   On: 01/25/2018 11:46   Dg Chest Port 1 View  Result Date: 01/17/2018 CLINICAL DATA:  Shortness of breath EXAM: PORTABLE CHEST 1 VIEW COMPARISON:  12/05/2017 FINDINGS: 1253 hours. Low volume film. The cardio pericardial silhouette is enlarged. There is pulmonary vascular congestion without overt pulmonary edema. The visualized bony structures of the thorax are intact. Telemetry leads overlie the chest. Left-sided pacer/AICD again noted. IMPRESSION: Cardiomegaly with vascular congestion.  Stable exam. Electronically Signed   By: Kennith Center M.D.   On: 01/17/2018 13:05   Discharge Exam: Vitals:   01/29/18 2100 01/30/18 0523  BP: (!) 121/48 (!) 137/46  Pulse: 70 69  Resp: 19   Temp: (!) 97.5 F (36.4 C) 97.7 F (36.5 C)  SpO2: 100% 96%   Vitals:   01/28/18 2027 01/29/18 0435 01/29/18 2100 01/30/18 0523  BP: 106/68 (!) 125/50 (!) 121/48 (!) 137/46  Pulse: 72 72 70 69  Resp: 18 18 19    Temp: 98.3 F (36.8 C) 97.7 F (36.5 C) (!) 97.5 F (36.4 C) 97.7 F (36.5 C)  TempSrc: Oral Oral Oral Oral  SpO2: 95% 90% 100% 96%  Weight:  (!) 145.4 kg  (320 lb 8.8 oz)  (!) 146.6 kg (323 lb 4.8 oz)  Height:        General: Pt is alert, awake, not in acute distress; obese Cardiovascular: RRR, S1/S2 +, no rubs, no gallops Respiratory: CTA bilaterally, no wheezing, no rhonchi Abdominal: Soft, NT, ND, bowel sounds + Extremities: Persistent 1-2+ edema    The results of significant diagnostics from this hospitalization (including imaging, microbiology, ancillary and laboratory) are listed below for reference.     Microbiology: No results found for this or any previous visit (from the past 240 hour(s)).   Labs: BNP (last 3 results) Recent Labs    11/17/17 1220 12/05/17 1422 01/17/18 1320  BNP 480.0* 400.0* 402.0*   Basic Metabolic Panel: Recent Labs  Lab 01/25/18 0744 01/26/18 0605 01/27/18 0510  01/28/18 0642 01/29/18 0503 01/30/18 0512  NA 139 138 139 136 135 136  K 4.0 4.0 4.1 3.8 3.6 4.1  CL 98* 99* 101 100* 98* 99*  CO2 29 28 28 27 27 26   GLUCOSE 98 100* 81 76 84 99  BUN 20 22* 21* 19 20 19   CREATININE 0.Donna 0.96 0.94 0.89 0.81 0.74  CALCIUM 9.5 9.3 9.3 8.8* 8.7* 8.7*  MG 2.0  --   --   --  1.9  --    Liver Function Tests: No results for input(s): AST, ALT, ALKPHOS, BILITOT, PROT, ALBUMIN in the last 168 hours. No results for input(s): LIPASE, AMYLASE in the last 168 hours. No results for input(s): AMMONIA in the last 168 hours. CBC: No results for input(s): WBC, NEUTROABS, HGB, HCT, MCV, PLT in the last 168 hours. Cardiac Enzymes: Recent Labs  Lab 01/25/18 1437  CKTOTAL 250*   BNP: Invalid input(s): POCBNP CBG: Recent Labs  Lab 01/29/18 0818 01/29/18 1202 01/29/18 1615 01/29/18 2101 01/30/18 0726  GLUCAP 169* 139* 194* 95 83   D-Dimer No results for input(s): DDIMER in the last 72 hours. Hgb A1c No results for input(s): HGBA1C in the last 72 hours. Lipid Profile No results for input(s): CHOL, HDL, LDLCALC, TRIG, CHOLHDL, LDLDIRECT in the last 72 hours. Thyroid function studies No results for  input(s): TSH, T4TOTAL, T3FREE, THYROIDAB in the last 72 hours.  Invalid input(s): FREET3 Anemia work up No results for input(s): VITAMINB12, FOLATE, FERRITIN, TIBC, IRON, RETICCTPCT in the last 72 hours. Urinalysis    Component Value Date/Time   COLORURINE STRAW (A) 01/17/2018 1239   APPEARANCEUR CLEAR 01/17/2018 1239   LABSPEC 1.005 01/17/2018 1239   PHURINE 8.0 01/17/2018 1239   GLUCOSEU NEGATIVE 01/17/2018 1239   HGBUR NEGATIVE 01/17/2018 1239   BILIRUBINUR NEGATIVE 01/17/2018 1239   KETONESUR NEGATIVE 01/17/2018 1239   PROTEINUR NEGATIVE 01/17/2018 1239   NITRITE NEGATIVE 01/17/2018 1239   LEUKOCYTESUR NEGATIVE 01/17/2018 1239   Sepsis Labs Invalid input(s): PROCALCITONIN,  WBC,  LACTICIDVEN Microbiology No results found for this or any previous visit (from the past 240 hour(s)).   Time coordinating discharge: 40 minutes  SIGNED:   Erick Blinks, DO [facility] 01/30/2018, 11:19 AM Pager [phone]  If 7PM-7AM, please contact night-coverage [email or web address] Password [email or web address]

## 2018-01-30 NOTE — Progress Notes (Signed)
Physical Therapy Treatment Patient Details Name: Donna Graves MRN: [id number] DOB: 07/07/1951 Today's Date: 01/30/2018    History of Present Illness Donna Graves is a 67 y.o. female with medical history significant of systolic congestive heart failure with last EF of 25%, morbid obesity, A. fib, pacemaker, diabetes, hypertension, hyperlipidemia comes into the emergency department after 2 weeks of weight gain over 12 pounds.  Patient reports swelling in her legs and her pannus.  She denies any fevers.  She reports that she complies with diet restrictions and salt intake.  She denies any nausea vomiting diarrhea.  Patient is referred for admission for acute congestive heart failure with fluid overload.  Her O2 sats are normal.  She denies any chest pain.    PT Comments    Pt seated on EOB at therapist entrance into room.  Pt friendly and wiling to participate.  Able to complete all transfers safely with supervision only.  Gait trianing with RW, SBA x 150 ft.  No LOB episodes through session, was limited by fatigue and labored breathing.  Improved cadence this session.  EOS pt left seated on EOB with husband in room and call bell within reach.    Follow Up Recommendations        Equipment Recommendations       Recommendations for Other Services       Precautions / Restrictions Precautions Precautions: Fall Restrictions Weight Bearing Restrictions: No    Mobility  Bed Mobility               General bed mobility comments: Pt sitting at EOB when therapist arrived, no bed mobility asssessed  Transfers Overall transfer level: Modified independent Equipment used: Rolling walker (2 wheeled) Transfers: Sit to/from Stand Sit to Stand: Supervision         General transfer comment: Pt able to demonstrate safe mechanics wiht STS, was labored with task  Ambulation/Gait Ambulation/Gait assistance: Supervision Ambulation Distance (Feet): 150 Feet Assistive device: Rolling  walker (2 wheeled) Gait Pattern/deviations: Decreased step length - right;Decreased step length - left;Decreased stride length;Trunk flexed Gait velocity: decreased   General Gait Details: slightly labored cadence with increased endurance/distance, no loss of balance, limited secondary to fatigue   Stairs             Wheelchair Mobility    Modified Rankin (Stroke Patients Only)       Balance                                            Cognition Arousal/Alertness: Awake/alert Behavior During Therapy: WFL for tasks assessed/performed Overall Cognitive Status: Within Functional Limits for tasks assessed                                        Exercises      General Comments        Pertinent Vitals/Pain Pain Assessment: No/denies pain    Home Living                      Prior Function            PT Goals (current goals can now be found in the care plan section)      Frequency  PT Plan Current plan remains appropriate    Co-evaluation              AM-PAC PT "6 Clicks" Daily Activity  Outcome Measure  Difficulty turning over in bed (including adjusting bedclothes, sheets and blankets)?: None Difficulty moving from lying on back to sitting on the side of the bed? : None Difficulty sitting down on and standing up from a chair with arms (e.g., wheelchair, bedside commode, etc,.)?: None Help needed moving to and from a bed to chair (including a wheelchair)?: None Help needed walking in hospital room?: None Help needed climbing 3-5 steps with a railing? : A Little 6 Click Score: 23    End of Session Equipment Utilized During Treatment: Gait belt Activity Tolerance: Patient tolerated treatment well;Patient limited by fatigue Patient left: in bed;with call bell/phone within reach(seated on EOB) Nurse Communication: Mobility status       Time: 1050-1105 PT Time Calculation (min) (ACUTE  ONLY): 15 min  Charges:  $Therapeutic Activity: 8-22 mins                    G Codes:       Becky Sax, LPTA; CBIS [phone]    Juel Burrow 01/30/2018, 12:13 PM

## 2018-01-30 NOTE — Consult Note (Addendum)
Cardiology Consult    Patient ID: Donna Graves; [id number]; 07/08/1951   Admit date: 01/17/2018 Date of Consult: 01/30/2018  Primary Care Provider: Doree Albee, MD Primary Cardiologist: Carlyle Dolly, MD  EP: Dr. Lovena Le  Patient Profile    Donna Graves is a 67 y.o. female with past medical history of chronic combined systolic and diastolic CHF (EF 89% by most recent echocardiogram in 09/2017), nonischemic cardiomyopathy (s/p BiV ICD placement in 05/2017 - unable to have LV lead placed), PAT, HTN, HLD, Type 2 DM, and morbid obesity who is being seen today for the evaluation of CHF at the request of Dr. Manuella Ghazi.   History of Present Illness    Donna Graves was last admitted to [facility] from 12/05/2017 to 12/25/2017 for an acute CHF exacerbation.  She diuresed over 38 L during admission and weight at the time of discharge was 330 lbs. She received IV Lasix 45m TID along with Metolazone 2.515mdaily during admission and she was discharged on Torsemide 80 mg twice daily along with Metolazone 2.5 mg daily.  At the time of her follow-up visit on 01/01/2018 and reported feeling weak and having dyspnea with minimal activity which was not new for her.  A repeat be met was obtained at that time which showed Na+ 134, K+ 2.9, and creatinine 0.93. Due to her hypokalemia, Metolazone was reduced to twice weekly and close 2-week follow-up was arranged but she presented to the ED the day of her scheduled appointment.   She reported having over a 12 pound weight gain within the past week with associated abdominal distention and lower extremity edema. Experienced worsening dyspnea the day prior to her admission.  Had also noted episodes of bradycardia with a heart rate in the 30's at times.  Initial labs showed WBC 5.0, Hgb 15.3, platelets 197, Na+ 135, K+ 2.8, and creatinine 1.05. BNP was elevated to 402.  D-dimer was elevated but CTA showed no evidence of a pulmonary embolism.CXR showed cardiomegaly  with vascular congestion. EKG showed AV dual-paced rhythm, HR 71.  She has been diuresing well with IV Lasix 80 mg twice daily and is overall down -31L with variable weights having been recorded. Coreg dosing has been reduced to episodes of bradycardia and she is currently on 3.12575mID. Was also restarted on Entresto this morning (has ben held due to episodes of hypotension with aggressive IV diuresis).   In talking with the patient today, she reports feeling back to baseline. Says her breathing has significant improved and her ankles "are the skinniest they have been in months". Denies any chest pain or palpitations. Is anxious to go home.    Past Medical History:  Diagnosis Date  . Atrial fibrillation and flutter ([facility]  History of RFA and ultimately AV node ablation  . Cardiomyopathy ([facility] [place]  a. LVEF 25-30% by echo in 04/2017. b. 09/2017: echo showing EF of 25% and moderate RV dysfunction  . [place] (congestive heart failure) ([facility] . Chronic systolic heart failure ([facility] . Depression   . GERD (gastroesophageal reflux disease)   . Gout   . History of cardiac catheterization 07/23/2013   RA 17; RV 43/20 PCWP 27.  LAD normal  LCx  norma  RCA normal.  LVEF 55%  . Hyperlipidemia   . Hypertension   . Hypothyroidism   . 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 TayLovena Le Type  2 diabetes mellitus ([place])     Past Surgical History:  Procedure Laterality Date  . ANAL FISSURE REPAIR    . BACK SURGERY    . BIV UPGRADE N/A 05/23/2017   Procedure: BiVI Upgrade;  Surgeon: Evans Lance, MD;  Location: [facility];  Service: Cardiovascular;  Laterality: N/A;  . ICD IMPLANT  05/23/2017   biv  . Left toe surgery       Home Medications:  Prior to Admission medications   Medication Sig Start Date End Date Taking? Authorizing Provider  acetaminophen (TYLENOL) 650 MG CR tablet Take 650-1,300 mg by mouth every 8  (eight) hours as needed for pain.   Yes [provider]  acidophilus (RISAQUAD) CAPS capsule Take 1 capsule by mouth daily. ULTIMATE FLORA PROBIOTIC   Yes [provider]  ARMOUR THYROID 15 MG tablet Take 1 tablet by mouth daily. 10/05/17  Yes [provider]  aspirin EC 81 MG EC tablet Take 1 tablet (81 mg total) by mouth daily. 10/31/17  Yes Isaac Bliss, Rayford Halsted, MD  carvedilol (COREG) 3.125 MG tablet Take 1 tablet (3.125 mg total) by mouth 2 (two) times daily with a meal. 12/25/17 01/24/18 Yes Arrien, Jimmy Picket, MD  Cholecalciferol (VITAMIN D PO) Take 7,000 Units by mouth daily.   Yes [provider]  dicyclomine (BENTYL) 10 MG capsule Take 10 mg by mouth 3 (three) times daily as needed for spasms.   Yes [provider]  escitalopram (LEXAPRO) 5 MG tablet Take 1 tablet by mouth daily. 01/02/18  Yes [provider]  glimepiride (AMARYL) 2 MG tablet Take 4 mg by mouth daily. BEFORE A MEAL   Yes [provider]  lisinopril (PRINIVIL,ZESTRIL) 2.5 MG tablet Take 1 tablet (2.5 mg total) by mouth daily. 11/13/17 02/11/18 Yes BranchAlphonse Guild, MD  magnesium oxide (MAG-OX) 400 MG tablet Take 400 mg by mouth daily.   Yes [provider]  metolazone (ZAROXOLYN) 2.5 MG tablet Take one tablet on Monday and Thursdays. 01/09/18  Yes Barrett, Evelene Croon, PA-C  Multiple Vitamins-Minerals (ALIVE ONCE DAILY WOMENS 50+ PO) Take 1 tablet by mouth daily.   Yes [provider]  omeprazole (PRILOSEC) 20 MG capsule Take 20 mg by mouth every morning.   Yes [provider]  polycarbophil (FIBERCON) 625 MG tablet Take 625 mg by mouth 4 (four) times daily.   Yes [provider]  potassium chloride SA (K-DUR,KLOR-CON) 20 MEQ tablet Take 2 tablets (40 mEq total) by mouth daily. 12/26/17 01/25/18 Yes Arrien, Jimmy Picket, MD  simvastatin (ZOCOR) 40 MG tablet Take 40 mg by mouth every evening.    Yes [provider]    spironolactone (ALDACTONE) 25 MG tablet Take 0.5 tablets (12.5 mg total) by mouth daily. 12/26/17 01/25/18 Yes Arrien, Jimmy Picket, MD  torsemide (DEMADEX) 20 MG tablet Take 4 tablets (80 mg total) by mouth 2 (two) times daily. 01/12/18  Yes Arnoldo Lenis, MD    Inpatient Medications: Scheduled Meds: . acidophilus  1 capsule Oral Daily  . aspirin EC  81 mg Oral Daily  . carvedilol  3.125 mg Oral BID WC  . glimepiride  2 mg Oral QAC breakfast  . insulin aspart  0-9 Units Subcutaneous TID WC  . magnesium oxide  400 mg Oral Daily  . [START ON 02/01/2018] metolazone  2.5 mg Oral Once per day on Mon Thu  . polycarbophil  625 mg Oral QID  . potassium chloride  40 mEq Oral  Daily  . sacubitril-valsartan  1 tablet Oral BID  . simvastatin  40 mg Oral QPM  . sodium chloride flush  3 mL Intravenous Q12H  . spironolactone  12.5 mg Oral Daily  . thyroid  15 mg Oral QAC breakfast  . torsemide  80 mg Oral BID   Continuous Infusions: . sodium chloride     PRN Meds: sodium chloride, acetaminophen, dicyclomine, ibuprofen, ondansetron (ZOFRAN) IV, sodium chloride flush  Allergies:    Allergies  Allergen Reactions  . Codeine Rash    Social History:   Social History   Socioeconomic History  . Marital status: Married    Spouse name: Not on file  . Number of children: Not on file  . Years of education: Not on file  . Highest education level: Not on file  Occupational History  . Not on file  Social Needs  . Financial resource strain: Not on file  . Food insecurity:    Worry: Not on file    Inability: Not on file  . Transportation needs:    Medical: Not on file    Non-medical: Not on file  Tobacco Use  . Smoking status: Never Smoker  . Smokeless tobacco: Never Used  Substance and Sexual Activity  . Alcohol use: No    Alcohol/week: 0.0 oz  . Drug use: No  . Sexual activity: Not Currently    Birth control/protection: None  Lifestyle  . Physical activity:    Days per week: Not  on file    Minutes per session: Not on file  . Stress: Not on file  Relationships  . Social connections:    Talks on phone: Not on file    Gets together: Not on file    Attends religious service: Not on file    Active member of club or organization: Not on file    Attends meetings of clubs or organizations: Not on file    Relationship status: Not on file  . Intimate partner violence:    Fear of current or ex partner: Not on file    Emotionally abused: Not on file    Physically abused: Not on file    Forced sexual activity: Not on file  Other Topics Concern  . Not on file  Social History Narrative  . Not on file     Family History:    Family History  Problem Relation Age of Onset  . Diabetes Mother 35  . Heart attack Mother   . Hypertension Mother   . Diabetes Brother 23  . Arthritis Father 22      Review of Systems    General:  No chills, fever, night sweats or weight changes.  Cardiovascular:  No chest pain, orthopnea, palpitations, paroxysmal nocturnal dyspnea. Positive for edema and dyspnea on exertion (Improved).  Dermatological: No rash, lesions/masses Respiratory: No cough, dyspnea Urologic: No hematuria, dysuria Abdominal:   No nausea, vomiting, diarrhea, bright red blood per rectum, melena, or hematemesis Neurologic:  No visual changes, wkns, changes in mental status. All other systems reviewed and are otherwise negative except as noted above.  Physical Exam/Data    Vitals:   01/28/18 2027 01/29/18 0435 01/29/18 2100 01/30/18 0523  BP: 106/68 (!) 125/50 (!) 121/48 (!) 137/46  Pulse: 72 72 70 69  Resp: '18 18 19   ' Temp: 98.3 F (36.8 C) 97.7 F (36.5 C) (!) 97.5 F (36.4 C) 97.7 F (36.5 C)  TempSrc: Oral Oral Oral Oral  SpO2: 95% 90% 100% 96%  Weight:  (!) 320 lb 8.8 oz (145.4 kg)  (!) 323 lb 4.8 oz (146.6 kg)  Height:        Intake/Output Summary (Last 24 hours) at 01/30/2018 1047 Last data filed at 01/30/2018 0800 Gross per 24 hour  Intake 480  ml  Output 3000 ml  Net -2520 ml   Filed Weights   01/28/18 0500 01/29/18 0435 01/30/18 0523  Weight: (!) 313 lb 0.9 oz (142 kg) (!) 320 lb 8.8 oz (145.4 kg) (!) 323 lb 4.8 oz (146.6 kg)   Body mass index is 55.49 kg/m.   General: Pleasant, obese Caucasian female appearing in NAD. Psych: Normal affect. Neuro: Alert and oriented X 3. Moves all extremities spontaneously. HEENT: Normal  Neck: Supple without bruits. JVD difficult to assess secondary to body habitus. Lungs:  Resp regular and unlabored, CTA without wheezing or rales. Heart: RRR no s3, s4, or murmurs. Abdomen: Soft, non-tender, non-distended, BS + x 4.  Extremities: No clubbing or cyanosis. Trace lower extremity edema. DP/PT/Radials 2+ and equal bilaterally.   EKG:  The EKG was personally reviewed and demonstrates:  AV dual-paced rhythm, HR 71. Telemetry:  Not connected.    Labs/Studies     Relevant CV Studies:  Echocardiogram: 09/2017 Study Conclusions  - Left ventricle: The estimated ejection fraction was 25%. - Ventricular septum: Septal motion showed abnormal function and   dyssynergy. - Mitral valve: Mildly calcified annulus. - Right ventricle: The cavity size was mildly dilated. Systolic   function was moderately reduced.  Impressions:  - Limited study with Definity. Images are still quite limited. LVEF   estimated at approximately 25% based on the most consistent   views. Cannot specifically comment on focal wall motion although   there does look to be septal dyssynergy. There is also moderate   right ventricular dysfunction.   Laboratory Data:  Chemistry Recent Labs  Lab 01/28/18 0642 01/29/18 0503 01/30/18 0512  NA 136 135 136  K 3.8 3.6 4.1  CL 100* 98* 99*  CO2 '27 27 26  ' GLUCOSE 76 84 99  BUN '19 20 19  ' CREATININE 0.89 0.81 0.74  CALCIUM 8.8* 8.7* 8.7*  GFRNONAA >60 >60 >60  GFRAA >60 >60 >60  ANIONGAP '9 10 11    ' No results for input(s): PROT, ALBUMIN, AST, ALT, ALKPHOS,  BILITOT in the last 168 hours. HematologyNo results for input(s): WBC, RBC, HGB, HCT, MCV, MCH, MCHC, RDW, PLT in the last 168 hours. Cardiac EnzymesNo results for input(s): TROPONINI in the last 168 hours. No results for input(s): TROPIPOC in the last 168 hours.  BNPNo results for input(s): BNP, PROBNP in the last 168 hours.  DDimer No results for input(s): DDIMER in the last 168 hours.  Radiology/Studies:  No results found.   Assessment & Plan    1. Acute on Chronic Combined Systolic and Diastolic CHF - the patient has experienced multiple admission for CHF exacerbations including an admission in 12/2017 at which time she diuresed over 38.0 L. Presented with worsening dyspnea, edema, and associated weight gain, all consistent with a recurrent CHF exacerbation.  - she has been on IV Lasix 80 mg twice daily and is overall down -31.0 L with variable weights having been recorded. Coreg and Entresto were held during diuresis due to episodes of hypotension but have been resumed. - she appears close to baseline by examination today and reports breathing is back to baseline. Reviewed with Dr. Domenic Polite and will switch back to PTA diuretic regimen of Torsemide 45m  BID, Metolazone 2.55m twice weekly, and Aldactone 12.54mdaily in addition to continuing on Coreg and Entresto. With her multiple CHF admissions, she would benefit from referral to the [facility]  2. Nonischemic Cardiomyopathy - s/p BiV ICD placement in 05/2017 - unable to have LV lead placed at that time. - followed by Dr. TaLovena Len the outpatient setting.   3. PAT - not connected to telemetry at this time. Continue Coreg (dose reduced during admission due to episodes of bradycardia).  - device interrogation in 11/2017 showed episodes of atrial fibrillation but she was not started on anticoagulation at that time and atrial fibrillation was not mentioned in Dr. TaTanna Furryfficial report. Will need to follow-up with him  in regards to clarification from this interrogation.    4. HTN - BP has been well-controlled at 121/46 - 137/48 within the past 24 hours.  - continue Entresto 24-2687mID, Spironolactone 12.5mg17mily, and Coreg 3.125mg27m.   5. HLD - remains on PTA Simvastatin 40mg 5my.    For questions or updates, please contact [facility] consult [email or web address] for contact info under Cardiology/STEMI.  Signed, BrittaErma Heritage 01/30/2018, 10:47 AM Pager: [phone] note:  Patient seen and examined.  Reviewed hospital course and discussed the case with Ms. StradeAhmed Graves  Donna Graves with acute on chronic combined heart failure associated with significant volume overload despite reported compliance with outpatient diuretics.  She has been managed on the hospitalist service and has diuresed a little over 38 L on IV Lasix with weight at this point less than previous discharge weight.  She states that she feels better, has been walking around using her walker and would like to go home.  She has a history of nonischemic cardiomyopathy with LVEF approximately 25% by echocardiogram earlier this year.  She does have a biventricular ICD in place, however was not able to have an LV lead placed resynchronization therapy.  On examination this morning she appears comfortable at rest.  Weight 323 pounds.  Net output more than intake of 2700 cc last 24 hours.  Lungs are clear without labored breathing.  Cardiac exam reveals indistinct PMI with RRR no gallop.  She has mild but chronic appearing leg edema with venous stasis distally.  Lab work shows potassium 4.1, BUN 19, creatinine 0.74.  I personally reviewed her ECG from 01/25/2018 which showed dual-chamber pacing.  She is currently not on telemetry.  Test x-ray from 01/25/2018 showed mild vascular congestion with stable cardiac enlargement.  Plan is for discharge home today.  Recommend switching back to Demadex 80 mg twice  daily, metolazone 2.5 mg twice weekly, and Aldactone at 12.5 mg daily.  She will otherwise continue on Coreg and low-dose Entresto.  [facility] for [facility].  SamuelSatira Sark, F.A.C.C.

## 2018-01-30 NOTE — Progress Notes (Signed)
Patient states understanding of discharge instructions.  

## 2018-02-01 ENCOUNTER — Telehealth: Payer: Self-pay | Admitting: Cardiology

## 2018-02-01 ENCOUNTER — Ambulatory Visit (INDEPENDENT_AMBULATORY_CARE_PROVIDER_SITE_OTHER): Payer: Medicare Other

## 2018-02-01 ENCOUNTER — Telehealth: Payer: Self-pay

## 2018-02-01 DIAGNOSIS — I5022 Chronic systolic (congestive) heart failure: Secondary | ICD-10-CM

## 2018-02-01 DIAGNOSIS — Z95810 Presence of automatic (implantable) cardiac defibrillator: Secondary | ICD-10-CM | POA: Diagnosis not present

## 2018-02-01 NOTE — Telephone Encounter (Signed)
I agree, also reviewed the nursing CHF note. We will see how things look tomorrow after her metolazone   J Fedrick Cefalu MD

## 2018-02-01 NOTE — Progress Notes (Signed)
EPIC Encounter for ICM Monitoring  Patient Name: Donna Graves is a 67 y.o. female Date: 02/01/2018 Primary Care Physican: Wilson Singer, MD Primary Cardiologist:Branch Electrophysiologist:Taylor Dry Weight:326 lbs Bi-V Pacing:98.1%  Clinical Status (17-Jan-2018 to 01-Feb-2018)  Treated VT/VF 0 episodes  AT/AF 8 episodes   Time in AT/AF 0.7 hr/day (3.0%)  Observations (1) (17-Jan-2018 to 01-Feb-2018)  Patient Activity less than 1 hr/day for 2 weeks.       Heart Failure questions reviewed, pt symptomatic with 3 lb weight gain since yesterday.  No changes in breathing and denied any lower extremity swelling.  Hospitalized 5/1 to 5/14 for CHF exacerbation and diuresed during stay.  Has referral to HF clinic but no appointment yet.    Thoracic impedance abnormal suggesting fluid accumulation since 01/11/2018.  Prescribed dosage: Torsemide 20 mg take 4 tablets (80 mg total) two times a day.  Metolazone 2.5 mg take 1 tablet on Monday and Thursdays. Potassium 20 mEq take 2 tablets (40 mEq total) daily.   Recommendations:  Patient has taken Metolazone in the last hour.   Reinforced fluid restriction to < 2 L daily and sodium restriction to less than 2000 mg daily.  She reported follow restrictions  Follow-up plan: ICM clinic phone appointment on 02/02/2018 to check effectiveness of Metolazone that was taken today.  Office appointment scheduled 02/21/2018 with Randall An, PA.  Copy of ICM check sent to Dr. Ladona Ridgel and Dr. Wyline Mood for review and if any recommendations will call back.   3 month ICM trend: 02/01/2018    AT/AF   1 Year ICM trend:       Karie Soda, RN 02/01/2018 11:35 AM

## 2018-02-01 NOTE — Telephone Encounter (Signed)
Attempted call back to patient to assist with remote transmission and left message to return call.

## 2018-02-01 NOTE — Telephone Encounter (Signed)
Spoke with pt and husband. Reminded pt of remote transmission that is due today. Pt will be home in about 30 minutes and requested to call back to help with sending transmission. She said she has gained 3 lbs and was getting ready to call the physicians office.  Advised will call back and will send message to Dr Verna Czech office once I receive the transmission regarding the weight gain.

## 2018-02-01 NOTE — Telephone Encounter (Signed)
Received call back from patient and husband.  Assisted to sent remote transmission.  See ICM note today.

## 2018-02-01 NOTE — Telephone Encounter (Signed)
Pt has gained 3lbs over night  °

## 2018-02-01 NOTE — Telephone Encounter (Signed)
Returned pt call. She stated that she has gained 3 lbs overnight. She also stated that she has not been eating anything too salty nor has she been drinking a lot of fluids. She did say that she spoke with nurse from Heart Failure clinic and was told to take her metolazone. She denies SOB, or any worsening swelling. She stated that she will call us back tomorrow with an update.

## 2018-02-02 ENCOUNTER — Ambulatory Visit (INDEPENDENT_AMBULATORY_CARE_PROVIDER_SITE_OTHER): Payer: Self-pay

## 2018-02-02 DIAGNOSIS — I5022 Chronic systolic (congestive) heart failure: Secondary | ICD-10-CM

## 2018-02-02 DIAGNOSIS — Z95810 Presence of automatic (implantable) cardiac defibrillator: Secondary | ICD-10-CM

## 2018-02-02 NOTE — Progress Notes (Signed)
EPIC Encounter for ICM Monitoring  Patient Name: Donna Graves is a 67 y.o. female Date: 02/02/2018 Primary Care Physican: Wilson Singer, MD Primary Cardiologist:Branch Electrophysiologist:Taylor Dry Weight:324.8 lbs today Bi-V Pacing:98.1%      Spoke with patient. Her weight dropped 1.2 lbs from 326 lb to 324.8 lbs since yesterday and after taking Metolazone.   Denies any lower extremity swelling or SOB.   No changes in Thoracic impedance and continues to be abnormal suggesting fluid accumulation after taking Thursday dose of Metolazone yesterday.  Prescribed dosage: Torsemide 20 mg take 4 tablets (80 mg total) two times a day.  Metolazone 2.5 mg take 1 tablet on Monday and Thursdays. Potassium 20 mEq take 2 tablets (40 mEq total) daily.   Recommendations:  Advised will send to Dr Wyline Mood for review and recommendations if needed.   Follow-up plan: ICM clinic phone appointment on 02/13/2018 to recheck fluid levels.  Office appointment scheduled 02/21/2018 with Randall An, PA.  Copy of ICM check sent to Dr. Ladona Ridgel and Dr. Wyline Mood.   3 month ICM trend: 02/02/2018    1 Year ICM trend:       Karie Soda, RN 02/02/2018 8:46 AM

## 2018-02-02 NOTE — Progress Notes (Signed)
Dr Wyline Mood not in the office today and advised by the nurse that Dr Purvis Sheffield is covering for him today.  Message and copy of note sent to Dr Purvis Sheffield to provide update on patient's condition.     Laqueta Linden, MD  Hope Holst, Josephine Igo, RN        If she hasn't taken today, just have her take metolazone with potassium today and weigh daily over the weekend.   Previous Messages    ----- Message -----  From: Karie Soda, RN  Sent: 02/02/2018 11:27 AM  To: Laqueta Linden, MD  Subject: RE: Dr Verna Czech patient              She took the Metolazone yesterday, none today. Still have her take tomorrow? Does she need to add another 40 mEq of Potassium with the metolazone tomorrow?    ----- Message -----  From: Laqueta Linden, MD  Sent: 02/02/2018 11:23 AM  To: Karie Soda, RN  Subject: RE: Dr Verna Czech patient              If she took metolazone 2.5 mg today, I would have her take it again tomorrow. BMET on Monday.

## 2018-02-02 NOTE — Progress Notes (Signed)
Call to patient. Advised Dr Purvis Sheffield is covering for Dr Wyline Mood today.  He advised to take Metolazone 2.5 mg 1 tablet today with extra Potassium take 2 tablets 40 mEq.  She will need a BMET drawn on Monday and she agreed to go to [facility]. Advised if she has any worsening or problems over the weekend to use local ER if needed.  Will recheck remote transmission on 02/06/2018.

## 2018-02-05 NOTE — Progress Notes (Signed)
BMET order faxed to [facility] at [facility] for labs to be drawn today.

## 2018-02-06 ENCOUNTER — Other Ambulatory Visit (HOSPITAL_COMMUNITY)
Admission: RE | Admit: 2018-02-06 | Discharge: 2018-02-06 | Disposition: A | Discharge status: Home / Self Care | Payer: Medicare Other | Source: Ambulatory Visit | Attending: Cardiovascular Disease | Admitting: Cardiovascular Disease

## 2018-02-06 ENCOUNTER — Ambulatory Visit (INDEPENDENT_AMBULATORY_CARE_PROVIDER_SITE_OTHER): Payer: Self-pay

## 2018-02-06 DIAGNOSIS — I5022 Chronic systolic (congestive) heart failure: Secondary | ICD-10-CM | POA: Diagnosis present

## 2018-02-06 DIAGNOSIS — Z95810 Presence of automatic (implantable) cardiac defibrillator: Secondary | ICD-10-CM

## 2018-02-06 LAB — BASIC METABOLIC PANEL
Anion gap: 14 (ref 5–15)
BUN: 34 mg/dL — AB (ref 6–20)
CALCIUM: 9.3 mg/dL (ref 8.9–10.3)
CHLORIDE: 94 mmol/L — AB (ref 101–111)
CO2: 28 mmol/L (ref 22–32)
CREATININE: 1.62 mg/dL — AB (ref 0.44–1.00)
GFR calc Af Amer: 37 mL/min — ABNORMAL LOW (ref >60)
GFR calc non Af Amer: 32 mL/min — ABNORMAL LOW (ref >60)
Glucose, Bld: 154 mg/dL — ABNORMAL HIGH (ref 65–99)
Potassium: 4.1 mmol/L (ref 3.5–5.1)
Sodium: 136 mmol/L (ref 135–145)

## 2018-02-06 NOTE — Progress Notes (Signed)
EPIC Encounter for ICM Monitoring  Patient Name: Donna Graves is a 67 y.o. female Date: 02/06/2018 Primary Care Physican: Doree Albee, MD Primary Cardiologist:Branch Electrophysiologist:Taylor Dry Weight:316 lbs today Bi-V Pacing:98.3%      Heart Failure questions reviewed, pt asymptomatic.  Weight dropped from 324 lbs on 5/17 to 316 lbs today.  She has lost the 3 lbs she gained since hospital discharge.     Thoracic impedance is worse since 02/02/2017 transmission suggesting fluid accumulation despite taking Metolazone 5/16 & 5/17.   Prescribed dosage: Torsemide20 mg take 4 tablets (80 mg total) two times a day. Metolazone 2.5 mg take 1 tablet on Monday and Thursdays. Potassium 20 mEq take 2 tablets (40 mEq total) daily.   Labs: 02/06/2018 Creatinine 1.62, BUN 34, Potassium 4.1, Sodium 136, EGFR 32-37 01/30/2018 Creatinine 0.74, BUN 19, Potassium 4.1, Sodium 136, EGFR >60  01/29/2018 Creatinine 0.81, BUN 20, Potassium 3.6, Sodium 135, EGFR >60  01/28/2018 Creatinine 0.89, BUN 19, Potassium 3.8, Sodium 136, EGFR >60  01/27/2018 Creatinine 0.94, BUN 21, Potassium 4.1, Sodium 139, EGFR >60  01/26/2018 Creatinine 0.96, BUN 22, Potassium 4.0, Sodium 138, EGFR >60  A complete set of results can be found in Results Review.  Recommendations:  Advised will send copy to Dr Lovena Le and Dr Harl Bowie and if any recommendations will call back.  Encouraged to call for fluid symptoms.  Follow-up plan: ICM clinic phone appointment on 02/20/2018.  Office appointment scheduled 02/21/2018 with Bernerd Pho, PA.  3 month ICM trend: 02/06/2018    1 Year ICM trend:       Rosalene Billings, RN 02/06/2018 12:48 PM

## 2018-02-07 NOTE — Progress Notes (Signed)
I am covering Dr. Verna Czech box today. If pt is asymptomatic and remaining below dry weight, would continue present regimen. Please emphasize 2g sodium/2L fluid restriction and keep f/u as planned. Pt should notify if weight begins to trend up or any new symptoms. Maiah Sinning PA-C

## 2018-02-08 ENCOUNTER — Telehealth: Payer: Self-pay

## 2018-02-08 ENCOUNTER — Other Ambulatory Visit: Payer: Self-pay | Admitting: Cardiology

## 2018-02-08 DIAGNOSIS — I5022 Chronic systolic (congestive) heart failure: Secondary | ICD-10-CM

## 2018-02-08 NOTE — Addendum Note (Signed)
Addended by: Marlyn Corporal A on: 02/08/2018 04:14 PM   Modules accepted: Orders

## 2018-02-08 NOTE — Telephone Encounter (Signed)
-----   Message from Jonelle Sidle, MD sent at 02/07/2018  3:54 PM EDT ----- Patient Dr. Wyline Mood.  He is currently out of the office.  Results were forwarded to my in box for review.  Creatinine has increased from 0.74 little over a week ago to 1.62.  Please carefully review her medication list.  Verify that she is not taking both lisinopril and Entresto which are currently listed.  Also stop Aldactone for now.  She already has a follow-up visit with Ms. Patrick Jupiter in early June and has been referred to the heart failure clinic as well.  She will need a follow-up BMET for that visit.

## 2018-02-08 NOTE — Telephone Encounter (Signed)
I spoke with patient and husband.He states patient had a bottle of lisinopril but was not taking it.I told him that patient is not to be on and to stop Aldactone.They will get BMET the day before apt with PA on 02/21/18. Lab slip at desk

## 2018-02-08 NOTE — Progress Notes (Signed)
Call to patient.  Advised of Donna Graves, [place] recommendations to continue with 2g sodium/2L fluid restriction and notify the office if weight begins to trend up or any new symptoms. She verbalized understanding and stated she feels fine today.  Dayna Dunn PA-C

## 2018-02-08 NOTE — Telephone Encounter (Signed)
Husband states he was told there was no ref to CHF clinic, I placed referral again

## 2018-02-08 NOTE — Telephone Encounter (Signed)
Attempted to reach,[id number] 

## 2018-02-20 ENCOUNTER — Ambulatory Visit (INDEPENDENT_AMBULATORY_CARE_PROVIDER_SITE_OTHER): Payer: Self-pay

## 2018-02-20 ENCOUNTER — Telehealth: Payer: Self-pay

## 2018-02-20 DIAGNOSIS — Z95810 Presence of automatic (implantable) cardiac defibrillator: Secondary | ICD-10-CM

## 2018-02-20 DIAGNOSIS — I5022 Chronic systolic (congestive) heart failure: Secondary | ICD-10-CM

## 2018-02-20 NOTE — Progress Notes (Signed)
Cardiology Office Note    Date:  02/21/2018   ID:  Donna Graves, DOB May 29, 1951, MRN [id number]  PCP:  Wilson Singer, MD  Cardiologist: Dina Rich, MD   EP: Dr. Ladona Ridgel  Chief Complaint  Patient presents with  . Hospitalization Follow-up    History of Present Illness:    Donna Graves is a 67 y.o. female with past medical history of chronic combined systolic and diastolic CHF (EF 40% by echocardiogram in 09/2017), nonischemic cardiomyopathy (s/p BiV ICD placement in 05/2017 - unable to have LV lead placed), PAT, HTN, HLD, Type 2 DM, and morbid obesity presents to the office today for hospital follow-up.  She was recently admitted to [facility] from 01/17/2018 to 01/30/2018 for evaluation of worsening abdominal distention and lower extremity edema. BNP was elevated to 402 on admission and CXR showed vascular congestion. She diuresed well with IV Lasix 80 mg twice daily and was overall -31.0 L during admission.  Weight had improved to 323 lbs at the time of discharge and she was transitioned back to Torsemide 80mg  BID along with Metolazone 2.5mg  twice weekly and Aldactone 12.5mg  daily in addition to remaining on Entresto and Coreg.   In talking with the patient today, she reports overall doing well since her recent hospitalization. She has been following weights at home and says this increased by over 4 pounds last week but has since returned to baseline. She was evaluated by her PCP during that timeframe and Metolazone was further titrated from 2.5 mg twice weekly to 3 times weekly with Spironolactone being discontinued at that time. Says breathing has overall been at baseline and denies any specific orthopnea or PND. Did notice worsening lower extremity edema during last week's weight gain but says this has improved. No recent chest pain, palpitations, lightheadedness, dizziness, or presyncope.    Past Medical History:  Diagnosis Date  . Atrial fibrillation and flutter (HCC)      History of RFA and ultimately AV node ablation  . Cardiomyopathy (HCC)    a. LVEF 25-30% by echo in 04/2017. b. 09/2017: echo showing EF of 25% and moderate RV dysfunction  . CHF (congestive heart failure) (HCC)   . Chronic systolic heart failure (HCC)   . Depression   . GERD (gastroesophageal reflux disease)   . Gout   . History of cardiac catheterization 07/23/2013   RA 17; RV 43/20 PCWP 27.  LAD normal  LCx  norma  RCA normal.  LVEF 55%  . Hyperlipidemia   . Hypertension   . Hypothyroidism   . 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 Ladona Ridgel  . Type 2 diabetes mellitus (HCC)     Past Surgical History:  Procedure Laterality Date  . ANAL FISSURE REPAIR    . BACK SURGERY    . BIV UPGRADE N/A 05/23/2017   Procedure: BiVI Upgrade;  Surgeon: Marinus Maw, MD;  Location: [facility] INVASIVE CV LAB;  Service: Cardiovascular;  Laterality: N/A;  . ICD IMPLANT  05/23/2017   biv  . Left toe surgery      Current Medications: Outpatient Medications Prior to Visit  Medication Sig Dispense Refill  . acetaminophen (TYLENOL) 650 MG CR tablet Take 650-1,300 mg by mouth every 8 (eight) hours as needed for pain.    Marland Kitchen acidophilus (RISAQUAD) CAPS capsule Take 1 capsule by mouth daily. ULTIMATE FLORA PROBIOTIC    . ARMOUR THYROID 15 MG tablet Take 1 tablet by  mouth daily.  2  . aspirin EC 81 MG EC tablet Take 1 tablet (81 mg total) by mouth daily.    . carvedilol (COREG) 3.125 MG tablet Take 1 tablet (3.125 mg total) by mouth 2 (two) times daily with a meal. 60 tablet 0  . Cholecalciferol (VITAMIN D PO) Take 7,000 Units by mouth daily.    Marland Kitchen dicyclomine (BENTYL) 10 MG capsule Take 10 mg by mouth 3 (three) times daily as needed for spasms.    Marland Kitchen escitalopram (LEXAPRO) 5 MG tablet Take 1 tablet by mouth daily.  2  . glimepiride (AMARYL) 2 MG tablet Take 4 mg by mouth daily. BEFORE A MEAL    . magnesium oxide (MAG-OX) 400 MG tablet  Take 400 mg by mouth daily.    . metolazone (ZAROXOLYN) 2.5 MG tablet Take one tablet on Monday and Thursdays. (Patient taking differently: Take one tablet on Monday Wednesday & Friday) 30 tablet 0  . Multiple Vitamins-Minerals (ALIVE ONCE DAILY WOMENS 50+ PO) Take 1 tablet by mouth daily.    Marland Kitchen omeprazole (PRILOSEC) 20 MG capsule Take 20 mg by mouth every morning.    . polycarbophil (FIBERCON) 625 MG tablet Take 625 mg by mouth 4 (four) times daily.    . potassium chloride SA (K-DUR,KLOR-CON) 20 MEQ tablet Take 2 tablets (40 mEq total) by mouth daily. 60 tablet 0  . sacubitril-valsartan (ENTRESTO) 24-26 MG Take 1 tablet by mouth 2 (two) times daily. 60 tablet 0  . simvastatin (ZOCOR) 40 MG tablet Take 40 mg by mouth every evening.     . torsemide (DEMADEX) 20 MG tablet TAKE 4 TABLETS (80 MG TOTAL) BY MOUTH 2 (TWO) TIMES DAILY. 240 tablet 1   No facility-administered medications prior to visit.      Allergies:   Codeine   Social History   Socioeconomic History  . Marital status: Married    Spouse name: Not on file  . Number of children: Not on file  . Years of education: Not on file  . Highest education level: Not on file  Occupational History  . Not on file  Social Needs  . Financial resource strain: Not on file  . Food insecurity:    Worry: Not on file    Inability: Not on file  . Transportation needs:    Medical: Not on file    Non-medical: Not on file  Tobacco Use  . Smoking status: Never Smoker  . Smokeless tobacco: Never Used  Substance and Sexual Activity  . Alcohol use: No    Alcohol/week: 0.0 oz  . Drug use: No  . Sexual activity: Not Currently    Birth control/protection: None  Lifestyle  . Physical activity:    Days per week: Not on file    Minutes per session: Not on file  . Stress: Not on file  Relationships  . Social connections:    Talks on phone: Not on file    Gets together: Not on file    Attends religious service: Not on file    Active member of  club or organization: Not on file    Attends meetings of clubs or organizations: Not on file    Relationship status: Not on file  Other Topics Concern  . Not on file  Social History Narrative  . Not on file     Family History:  The patient's family history includes Arthritis (age of onset: 21) in her father; Diabetes (age of onset: 64) in her brother; Diabetes (age  of onset: 28) in her mother; Heart attack in her mother; Hypertension in her mother.   Review of Systems:   Please see the history of present illness.     General:  No chills, fever, night sweats or weight changes.  Cardiovascular:  No chest pain, dyspnea on exertion, orthopnea, palpitations, paroxysmal nocturnal dyspnea. Positive for edema.  Dermatological: No rash, lesions/masses Respiratory: No cough, dyspnea Urologic: No hematuria, dysuria Abdominal:   No nausea, vomiting, diarrhea, bright red blood per rectum, melena, or hematemesis Neurologic:  No visual changes, wkns, changes in mental status. All other systems reviewed and are otherwise negative except as noted above.   Physical Exam:    VS:  BP 130/72 (BP Location: Right Arm)   Pulse 73   Ht 5\' 4"  (1.626 m)   Wt (!) 322 lb (146.1 kg)   SpO2 91%   BMI 55.27 kg/m    General: Obese Caucasian female appearing in no acute distress. Head: Normocephalic, atraumatic, sclera non-icteric, no xanthomas, nares are without discharge.  Neck: No carotid bruits. JVD difficult to assess secondary to body habitus  Lungs: Respirations regular and unlabored, without wheezes or rales.  Heart: Regular rate and rhythm. No S3 or S4.  No murmur, no rubs, or gallops appreciated. Abdomen: Soft, non-tender, non-distended with normoactive bowel sounds. No hepatomegaly. No rebound/guarding. No obvious abdominal masses. Msk:  Strength and tone appear normal for age. No joint deformities or effusions. Extremities: No clubbing or cyanosis. 1+ pitting edema up to mid-shins bilaterally.   Distal pedal pulses are 2+ bilaterally. Neuro: Alert and oriented X 3. Moves all extremities spontaneously. No focal deficits noted. Psych:  Responds to questions appropriately with a normal affect. Skin: No rashes or lesions noted  Wt Readings from Last 3 Encounters:  02/21/18 (!) 322 lb (146.1 kg)  01/30/18 (!) 323 lb 4.8 oz (146.6 kg)  01/01/18 (!) 321 lb (145.6 kg)     Studies/Labs Reviewed:   EKG:  EKG is not ordered today.   Recent Labs: 01/17/2018: ALT 18; B Natriuretic Peptide 402.0; Hemoglobin 15.3; Platelets 197 01/19/2018: TSH 3.436 01/29/2018: Magnesium 1.9 02/21/2018: BUN 26; Creatinine, Ser 1.21; Potassium 3.8; Sodium 143   Lipid Panel No results found for: CHOL, TRIG, HDL, CHOLHDL, VLDL, LDLCALC, LDLDIRECT  Additional studies/ records that were reviewed today include:   Limited Echocardiogram: 10/17/2017 Study Conclusions  - Left ventricle: The estimated ejection fraction was 25%. - Ventricular septum: Septal motion showed abnormal function and   dyssynergy. - Mitral valve: Mildly calcified annulus. - Right ventricle: The cavity size was mildly dilated. Systolic   function was moderately reduced.  Impressions:  - Limited study with Definity. Images are still quite limited. LVEF   estimated at approximately 25% based on the most consistent   views. Cannot specifically comment on focal wall motion although   there does look to be septal dyssynergy. There is also moderate   right ventricular dysfunction.  Assessment:    1. Chronic combined systolic (congestive) and diastolic (congestive) heart failure (HCC)   2. ICD (implantable cardioverter-defibrillator) in place   3. PAT (paroxysmal atrial tachycardia) (HCC)   4. Essential hypertension   5. Morbid obesity (HCC)      Plan:   In order of problems listed above:  1. Chronic Combined Systolic and Diastolic CHF - the patient has a known reduced EF of 25% by echocardiogram in 09/2017. She has experienced  multiple admissions over the past several months with her most recent being in 01/2018 at  which time she diuresed -31.0 L. - recent device interrogation showed thoracic impedence had started to increase last week and at that time she had experienced associated weight gain and edema, therefore Metolazone was titrated to 3x weekly and she experienced improvement in her symptoms. Has mild edema on examination today but overall significantly improved compared to prior visits.  - would continue on Torsemide 80mg  BID along with Metolazone 2.5mg  three times weekly. Also remains on Entresto and Coreg. Unable to further titrate given her soft BP when checked at home (SBP is in the 90's at times by her husband's report). Will refer to the CHF Clinic for further evaluation as recommended at the time of her last hospitalization (referrals have been entered but a visit has not been scheduled). Sodium and fluid restriction was again reviewed with the patient and her spouse.   2. Nonischemic Cardiomyopathy - s/p BiV ICD placement in 05/2017 but unable to have LV Lead placed.  - followed by Dr. Ladona Ridgel.   3. PAT - history of PAT by review of notes but most recent device interrogation in 11/2017 showed episodes of atrial fibrillation. Unclear why she has not been placed on anticoagulation (perhaps due to low burden but this was 9.1% by most recent interrogation). Will route today's note to Dr. Ladona Ridgel for further information regarding this.  - continue low-dose Coreg 3.125mg  BID.   4. HTN - BP is stable at 130/72 during today's visit. Says BP has been soft with SBP in the 90's at times when checked at home but she is asymptomatic with this. - would continue low-dose Coreg and Entresto.   5. Morbid Obesity - this continues to play a significant role in her overall fatigue and poor functional status. Her breathing is now back to baseline and hopefully this will allow her to be more active in the ambulatory setting. Was  being followed by Bariatrics at [facility] but this has been delayed due to her multiple admissions.   Medication Adjustments/Labs and Tests Ordered: Current medicines are reviewed at length with the patient today.  Concerns regarding medicines are outlined above.  Medication changes, Labs and Tests ordered today are listed in the Patient Instructions below. Patient Instructions  Medication Instructions:  Your physician recommends that you continue on your current medications as directed. Please refer to the Current Medication list given to you today.   Labwork: NONE   Testing/Procedures: NONE   Follow-Up: Your physician recommends that you schedule a follow-up appointment in: Follow-up with CHF clinic; F/U in [place] in 6 Weeks    Any Other Special Instructions Will Be Listed Below (If Applicable).  If you need a refill on your cardiac medications before your next appointment, please call your pharmacy.  Thank you for choosing [facility]!    Signed, Ellsworth Lennox, PA-C  02/21/2018 8:09 PM    [facility] [address] [place], [place] [address] Phone: [phone]

## 2018-02-20 NOTE — Telephone Encounter (Signed)
Remote ICM transmission received.  Attempted call to patient and left message to return call. 

## 2018-02-20 NOTE — Progress Notes (Signed)
EPIC Encounter for ICM Monitoring  Patient Name: Donna Graves is a 67 y.o. female Date: 02/20/2018 Primary Care Physican: Doree Albee, MD Primary Cardiologist:Branch Electrophysiologist:Taylor Dry Weight:Previous weight 316lbs  Bi-V Pacing:99.1%  Clinical Status (13-Feb-2018 to 20-Feb-2018) Treated VT/VF 0 episodes  AT/AF 4 episodes  Time in AT/AF 0.3 hr/day (1.1%)  Longest AT/AF 98 minutes Observations (1) (13-Feb-2018 to 20-Feb-2018)  Patient Activity less than 1 hr/day for 1 weeks.       Attempted call to patient and unable to reach.  Left message to return call.  Transmission reviewed.  Hospitalized 5/1 to 5/14 for CHF exacerbation    Thoracic impedance progressively worsening suggesting fluid accumulation.  Prescribed dosage: Torsemide20 mg take 4 tablets (80 mg total) two times a day. Metolazone 2.5 mg take 1 tablet on Monday and Thursdays. Potassium 20 mEq take 2 tablets (40 mEq total) daily.  Labs: 02/06/2018 Creatinine 1.62, BUN 34, Potassium 4.1, Sodium 136, EGFR 32-37 01/30/2018 Creatinine 0.74, BUN 19, Potassium 4.1, Sodium 136, EGFR >60  01/29/2018 Creatinine 0.81, BUN 20, Potassium 3.6, Sodium 135, EGFR >60  01/28/2018 Creatinine 0.89, BUN 19, Potassium 3.8, Sodium 136, EGFR >60  01/27/2018 Creatinine 0.94, BUN 21, Potassium 4.1, Sodium 139, EGFR >60  01/26/2018 Creatinine 0.96, BUN 22, Potassium 4.0, Sodium 138, EGFR >60  A complete set of results can be found in Results Review.  Recommendations: NONE - Unable to reach.  Follow-up plan: ICM clinic phone appointment on 03/05/2018. Office appointment scheduled 02/21/2018 with Bernerd Pho, [place].  Copy of ICM check sent to Dr. Harl Bowie, Bernerd Pho, PA and Dr. Lovena Le.   3 month ICM trend: 02/20/2018    AT/AF   1 Year ICM trend:       Rosalene Billings, RN 02/20/2018 3:00 PM

## 2018-02-21 ENCOUNTER — Ambulatory Visit (INDEPENDENT_AMBULATORY_CARE_PROVIDER_SITE_OTHER): Payer: Medicare Other | Admitting: Student

## 2018-02-21 ENCOUNTER — Other Ambulatory Visit (HOSPITAL_COMMUNITY)
Admission: RE | Admit: 2018-02-21 | Discharge: 2018-02-21 | Disposition: A | Discharge status: Home / Self Care | Payer: Medicare Other | Source: Ambulatory Visit | Attending: Cardiovascular Disease | Admitting: Cardiovascular Disease

## 2018-02-21 ENCOUNTER — Encounter: Payer: Self-pay | Admitting: Student

## 2018-02-21 VITALS — BP 130/72 | HR 73 | Ht 64.0 in | Wt 322.0 lb

## 2018-02-21 DIAGNOSIS — I10 Essential (primary) hypertension: Secondary | ICD-10-CM | POA: Diagnosis not present

## 2018-02-21 DIAGNOSIS — Z95810 Presence of automatic (implantable) cardiac defibrillator: Secondary | ICD-10-CM

## 2018-02-21 DIAGNOSIS — I5022 Chronic systolic (congestive) heart failure: Secondary | ICD-10-CM | POA: Diagnosis present

## 2018-02-21 DIAGNOSIS — I5042 Chronic combined systolic (congestive) and diastolic (congestive) heart failure: Secondary | ICD-10-CM

## 2018-02-21 DIAGNOSIS — I471 Supraventricular tachycardia: Secondary | ICD-10-CM | POA: Diagnosis not present

## 2018-02-21 LAB — BASIC METABOLIC PANEL
Anion gap: 12 (ref 5–15)
BUN: 26 mg/dL — ABNORMAL HIGH (ref 6–20)
CALCIUM: 9.5 mg/dL (ref 8.9–10.3)
CHLORIDE: 96 mmol/L — AB (ref 101–111)
CO2: 35 mmol/L — AB (ref 22–32)
Creatinine, Ser: 1.21 mg/dL — ABNORMAL HIGH (ref 0.44–1.00)
GFR calc non Af Amer: 46 mL/min — ABNORMAL LOW (ref >60)
GFR, EST AFRICAN AMERICAN: 53 mL/min — AB (ref >60)
Glucose, Bld: 133 mg/dL — ABNORMAL HIGH (ref 65–99)
Potassium: 3.8 mmol/L (ref 3.5–5.1)
Sodium: 143 mmol/L (ref 135–145)

## 2018-02-21 NOTE — Patient Instructions (Signed)
Medication Instructions:  Your physician recommends that you continue on your current medications as directed. Please refer to the Current Medication list given to you today.   Labwork: NONE   Testing/Procedures: NONE   Follow-Up: Your physician recommends that you schedule a follow-up appointment in: 6 Weeks   Any Other Special Instructions Will Be Listed Below (If Applicable).     If you need a refill on your cardiac medications before your next appointment, please call your pharmacy.  Thank you for choosing [facility]!   

## 2018-03-02 ENCOUNTER — Telehealth (HOSPITAL_COMMUNITY): Payer: Self-pay | Admitting: Vascular Surgery

## 2018-03-02 NOTE — Telephone Encounter (Signed)
left pt message to move appt from 6/17 to 7/1 @ 130, asked pt to confirm appt change

## 2018-03-05 ENCOUNTER — Ambulatory Visit (INDEPENDENT_AMBULATORY_CARE_PROVIDER_SITE_OTHER): Payer: Medicare Other

## 2018-03-05 ENCOUNTER — Ambulatory Visit (INDEPENDENT_AMBULATORY_CARE_PROVIDER_SITE_OTHER): Payer: Medicare Other | Admitting: *Deleted

## 2018-03-05 ENCOUNTER — Encounter (HOSPITAL_COMMUNITY): Payer: Medicare Other

## 2018-03-05 DIAGNOSIS — I442 Atrioventricular block, complete: Secondary | ICD-10-CM

## 2018-03-05 DIAGNOSIS — Z95810 Presence of automatic (implantable) cardiac defibrillator: Secondary | ICD-10-CM | POA: Diagnosis not present

## 2018-03-05 DIAGNOSIS — I5042 Chronic combined systolic (congestive) and diastolic (congestive) heart failure: Secondary | ICD-10-CM

## 2018-03-05 NOTE — Progress Notes (Signed)
Remote ICD transmission.   

## 2018-03-05 NOTE — Progress Notes (Signed)
EPIC Encounter for ICM Monitoring  Patient Name: Donna Graves is a 67 y.o. female Date: 03/05/2018 Primary Care Physican: Doree Albee, MD Primary Cardiologist:Branch/Strader PA Electrophysiologist:Taylor Dry Weight: 321lbs discharge weight 03/02/2018 Bi-V Pacing:99.1%  Clinical Status (20-Feb-2018 to 05-Mar-2018)  Treated VT/VF 0 episodes   AT/AF 289 episodes   Time in AT/AF 1.7 hr/day (6.9%)  Observations (2) (20-Feb-2018 to 05-Mar-2018)  AT/AF >= 6 hr for 1 days.  Patient Activity less than 1 hr/day for 1 weeks.        Heart Failure questions reviewed, pt has some swelling in legs but has improved.   Hospitalized 02/22/2018 - 03/02/2018 for heart medication reaction but she was unsure of the med name.    Thoracic impedance abnormal suggesting fluid accumulation but trending toward baseline.  Fluid accumulation worsened during hospitalization.  Prescribed dosage: Torsemide20 mg take 4 tablets (80 mg total) two times a day. Metolazone 2.5 mg take 1 tablet on Monday and Thursdays. Potassium 20 mEq take 2 tablets (40 mEq total) daily.  Labs: 02/06/2018 Creatinine1.62, BUN34, Potassium4.1, Sodium136, VOPF29-24 01/30/2018 Creatinine0.74, BUN19, Potassium4.1, Sodium136, EGFR>60  01/29/2018 Creatinine0.81, BUN20, Potassium3.6, Sodium135, EGFR>60  01/28/2018 Creatinine0.89, BUN19, Potassium3.8, Sodium136, EGFR>60  01/27/2018 Creatinine0.94, BUN21, Potassium4.1, Sodium139, EGFR>60  01/26/2018 Creatinine0.96, BUN22, Potassium4.0, Sodium138, EGFR>60 A complete set of results can be found in Results Review.  Recommendations: Advised to weigh daily.  Reinforced salt restriction.  Follow-up plan: ICM clinic phone appointment on 03/15/2018 to recheck fluid levels (manual send).  Office appointment scheduled 1st appointment on 03/19/2018 with HF clinic NP/PA.   Copy of ICM check sent to Dr. Harl Bowie and Dr. Lovena Le and Bernerd Pho, PA.   3  month ICM trend: 03/05/2018    AT/AF   1 Year ICM trend:       Rosalene Billings, RN 03/05/2018 5:14 PM

## 2018-03-06 ENCOUNTER — Other Ambulatory Visit: Payer: Self-pay | Admitting: Cardiology

## 2018-03-06 LAB — CUP PACEART REMOTE DEVICE CHECK
Brady Statistic AP VP Percent: 86.89 %
Brady Statistic AP VS Percent: 0.28 %
Brady Statistic AS VP Percent: 12.61 %
Brady Statistic RA Percent Paced: 85.28 %
HighPow Impedance: 78 Ohm
Implantable Lead Implant Date: 20141223
Implantable Lead Implant Date: 20180904
Implantable Lead Location: 753860
Implantable Lead Model: 5076
Implantable Lead Model: 5076
Lead Channel Impedance Value: 285 Ohm
Lead Channel Impedance Value: 399 Ohm
Lead Channel Impedance Value: 4047 Ohm
Lead Channel Impedance Value: 4047 Ohm
Lead Channel Pacing Threshold Pulse Width: 0.4 ms
Lead Channel Pacing Threshold Pulse Width: 0.4 ms
Lead Channel Sensing Intrinsic Amplitude: 0.25 mV
Lead Channel Sensing Intrinsic Amplitude: 8.5 mV
Lead Channel Setting Pacing Amplitude: 3.5 V
Lead Channel Setting Pacing Pulse Width: 0.4 ms
Lead Channel Setting Sensing Sensitivity: 0.3 mV
MDC IDC LEAD IMPLANT DT: 20050121
MDC IDC LEAD LOCATION: 753859
MDC IDC LEAD LOCATION: 753860
MDC IDC MSMT BATTERY REMAINING LONGEVITY: 73 mo
MDC IDC MSMT BATTERY VOLTAGE: 2.99 V
MDC IDC MSMT LEADCHNL LV IMPEDANCE VALUE: 4047 Ohm
MDC IDC MSMT LEADCHNL RA IMPEDANCE VALUE: 532 Ohm
MDC IDC MSMT LEADCHNL RA PACING THRESHOLD AMPLITUDE: 1.5 V
MDC IDC MSMT LEADCHNL RA SENSING INTR AMPL: 0.25 mV
MDC IDC MSMT LEADCHNL RV PACING THRESHOLD AMPLITUDE: 1.5 V
MDC IDC MSMT LEADCHNL RV SENSING INTR AMPL: 8.5 mV
MDC IDC PG IMPLANT DT: 20180904
MDC IDC SESS DTM: 20190617082506
MDC IDC SET LEADCHNL RA PACING AMPLITUDE: 2.25 V
MDC IDC STAT BRADY AS VS PERCENT: 0.21 %
MDC IDC STAT BRADY RV PERCENT PACED: 98.81 %

## 2018-03-15 ENCOUNTER — Telehealth: Payer: Self-pay

## 2018-03-15 NOTE — Telephone Encounter (Signed)
LMOVM reminding pt to send remote transmission.   

## 2018-03-16 NOTE — Progress Notes (Signed)
No ICM remote transmission received for 03/15/2018 and next ICM transmission scheduled for 04/05/2018.    

## 2018-03-19 ENCOUNTER — Encounter (HOSPITAL_COMMUNITY): Payer: Medicare Other

## 2018-03-31 ENCOUNTER — Other Ambulatory Visit: Payer: Self-pay | Admitting: Cardiology

## 2018-04-05 ENCOUNTER — Telehealth: Payer: Self-pay

## 2018-04-05 NOTE — Telephone Encounter (Signed)
LMOVM reminding pt to send remote transmission.   

## 2018-04-06 NOTE — Progress Notes (Signed)
No ICM remote transmission received for 04/05/2018 and next ICM transmission scheduled for 04/11/2018.

## 2018-04-10 ENCOUNTER — Telehealth: Payer: Self-pay | Admitting: Cardiology

## 2018-04-10 NOTE — Telephone Encounter (Signed)
LMOVM reminding pt to send remote transmission.   

## 2018-04-10 NOTE — Progress Notes (Deleted)
Cardiology Office Note    Date:  04/10/2018   ID:  Donna Graves, DOB 08-01-1951, MRN [id number]  PCP:  Wilson Singer, MD  Cardiologist: Dina Rich, MD   EP: Dr. Ladona Ridgel  No chief complaint on file.   History of Present Illness:    Donna Graves is a 67 y.o. female with past medical history of chronic combined systolic and diastolic CHF (EF 27% by echocardiogram in 09/2017), nonischemic cardiomyopathy (s/p BiV ICD placement in 05/2017 - unable to have LV lead placed), PAT, HTN, HLD, Type 2 DM, and morbid obesity who presents to the office today for 6-week follow-up.   She was last examined by myself on 02/21/2018 following a hospitalization for an acute CHF exacerbation during which she diuresed over -31.0L. At the time of her visit, she was overall doing well and reported her breathing had been at baseline. Weight was stable at 322 lbs and she was continued on Torsemide 80mg  BID along with Metolazone 2.5mg  three times weekly for diuresis.        Past Medical History:  Diagnosis Date  . Atrial fibrillation and flutter (HCC)    History of RFA and ultimately AV node ablation  . Cardiomyopathy (HCC)    a. LVEF 25-30% by echo in 04/2017. b. 09/2017: echo showing EF of 25% and moderate RV dysfunction  . CHF (congestive heart failure) (HCC)   . Chronic systolic heart failure (HCC)   . Depression   . GERD (gastroesophageal reflux disease)   . Gout   . History of cardiac catheterization 07/23/2013   RA 17; RV 43/20 PCWP 27.  LAD normal  LCx  norma  RCA normal.  LVEF 55%  . Hyperlipidemia   . Hypertension   . Hypothyroidism   . 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 Ladona Ridgel  . Type 2 diabetes mellitus (HCC)     Past Surgical History:  Procedure Laterality Date  . ANAL FISSURE REPAIR    . BACK SURGERY    . BIV UPGRADE N/A 05/23/2017   Procedure: BiVI Upgrade;  Surgeon: Marinus Maw, MD;   Location: [facility] INVASIVE CV LAB;  Service: Cardiovascular;  Laterality: N/A;  . ICD IMPLANT  05/23/2017   biv  . Left toe surgery      Current Medications: Outpatient Medications Prior to Visit  Medication Sig Dispense Refill  . acetaminophen (TYLENOL) 650 MG CR tablet Take 650-1,300 mg by mouth every 8 (eight) hours as needed for pain.    Marland Kitchen acidophilus (RISAQUAD) CAPS capsule Take 1 capsule by mouth daily. ULTIMATE FLORA PROBIOTIC    . ARMOUR THYROID 15 MG tablet Take 1 tablet by mouth daily.  2  . aspirin EC 81 MG EC tablet Take 1 tablet (81 mg total) by mouth daily.    . carvedilol (COREG) 3.125 MG tablet Take 1 tablet (3.125 mg total) by mouth 2 (two) times daily with a meal. 60 tablet 0  . Cholecalciferol (VITAMIN D PO) Take 7,000 Units by mouth daily.    Marland Kitchen dicyclomine (BENTYL) 10 MG capsule Take 10 mg by mouth 3 (three) times daily as needed for spasms.    Marland Kitchen escitalopram (LEXAPRO) 5 MG tablet Take 1 tablet by mouth daily.  2  . glimepiride (AMARYL) 2 MG tablet Take 4 mg by mouth daily. BEFORE A MEAL    . magnesium oxide (MAG-OX) 400 MG tablet Take 400 mg by mouth daily.    Marland Kitchen  metolazone (ZAROXOLYN) 2.5 MG tablet Take one tablet on Monday and Thursdays. (Patient taking differently: Take one tablet on Monday Wednesday & Friday) 30 tablet 0  . Multiple Vitamins-Minerals (ALIVE ONCE DAILY WOMENS 50+ PO) Take 1 tablet by mouth daily.    Marland Kitchen omeprazole (PRILOSEC) 20 MG capsule Take 20 mg by mouth every morning.    . polycarbophil (FIBERCON) 625 MG tablet Take 625 mg by mouth 4 (four) times daily.    . potassium chloride SA (K-DUR,KLOR-CON) 20 MEQ tablet Take 2 tablets (40 mEq total) by mouth daily. 60 tablet 0  . simvastatin (ZOCOR) 40 MG tablet Take 40 mg by mouth every evening.     . torsemide (DEMADEX) 20 MG tablet TAKE 4 TABLETS (80 MG TOTAL) BY MOUTH 2 (TWO) TIMES DAILY. 240 tablet 3   No facility-administered medications prior to visit.      Allergies:   Codeine   Social History    Socioeconomic History  . Marital status: Married    Spouse name: Not on file  . Number of children: Not on file  . Years of education: Not on file  . Highest education level: Not on file  Occupational History  . Not on file  Social Needs  . Financial resource strain: Not on file  . Food insecurity:    Worry: Not on file    Inability: Not on file  . Transportation needs:    Medical: Not on file    Non-medical: Not on file  Tobacco Use  . Smoking status: Never Smoker  . Smokeless tobacco: Never Used  Substance and Sexual Activity  . Alcohol use: No    Alcohol/week: 0.0 oz  . Drug use: No  . Sexual activity: Not Currently    Birth control/protection: None  Lifestyle  . Physical activity:    Days per week: Not on file    Minutes per session: Not on file  . Stress: Not on file  Relationships  . Social connections:    Talks on phone: Not on file    Gets together: Not on file    Attends religious service: Not on file    Active member of club or organization: Not on file    Attends meetings of clubs or organizations: Not on file    Relationship status: Not on file  Other Topics Concern  . Not on file  Social History Narrative  . Not on file     Family History:  The patient's ***family history includes Arthritis (age of onset: 70) in her father; Diabetes (age of onset: 24) in her brother; Diabetes (age of onset: 54) in her mother; Heart attack in her mother; Hypertension in her mother.   Review of Systems:   Please see the history of present illness.     General:  No chills, fever, night sweats or weight changes.  Cardiovascular:  No chest pain, dyspnea on exertion, edema, orthopnea, palpitations, paroxysmal nocturnal dyspnea. Dermatological: No rash, lesions/masses Respiratory: No cough, dyspnea Urologic: No hematuria, dysuria Abdominal:   No nausea, vomiting, diarrhea, bright red blood per rectum, melena, or hematemesis Neurologic:  No visual changes, wkns, changes  in mental status. All other systems reviewed and are otherwise negative except as noted above.   Physical Exam:    VS:  There were no vitals taken for this visit.   General: Well developed, well nourished,female appearing in no acute distress. Head: Normocephalic, atraumatic, sclera non-icteric, no xanthomas, nares are without discharge.  Neck: No carotid bruits. JVD  not elevated.  Lungs: Respirations regular and unlabored, without wheezes or rales.  Heart: ***Regular rate and rhythm. No S3 or S4.  No murmur, no rubs, or gallops appreciated. Abdomen: Soft, non-tender, non-distended with normoactive bowel sounds. No hepatomegaly. No rebound/guarding. No obvious abdominal masses. Msk:  Strength and tone appear normal for age. No joint deformities or effusions. Extremities: No clubbing or cyanosis. No edema.  Distal pedal pulses are 2+ bilaterally. Neuro: Alert and oriented X 3. Moves all extremities spontaneously. No focal deficits noted. Psych:  Responds to questions appropriately with a normal affect. Skin: No rashes or lesions noted  Wt Readings from Last 3 Encounters:  02/21/18 (!) 322 lb (146.1 kg)  01/30/18 (!) 323 lb 4.8 oz (146.6 kg)  01/01/18 (!) 321 lb (145.6 kg)        Studies/Labs Reviewed:   EKG:  EKG is not ordered today.    Recent Labs: 01/17/2018: ALT 18; B Natriuretic Peptide 402.0; Hemoglobin 15.3; Platelets 197 01/19/2018: TSH 3.436 01/29/2018: Magnesium 1.9 02/21/2018: BUN 26; Creatinine, Ser 1.21; Potassium 3.8; Sodium 143   Lipid Panel No results found for: CHOL, TRIG, HDL, CHOLHDL, VLDL, LDLCALC, LDLDIRECT  Additional studies/ records that were reviewed today include:   Limited Echo: 09/2017 Study Conclusions  - Left ventricle: The estimated ejection fraction was 25%. - Ventricular septum: Septal motion showed abnormal function and   dyssynergy. - Mitral valve: Mildly calcified annulus. - Right ventricle: The cavity size was mildly dilated. Systolic    function was moderately reduced.  Impressions:  - Limited study with Definity. Images are still quite limited. LVEF   estimated at approximately 25% based on the most consistent   views. Cannot specifically comment on focal wall motion although   there does look to be septal dyssynergy. There is also moderate   right ventricular dysfunction.  Assessment:    No diagnosis found.   Plan:   In order of problems listed above:  1. Chronic Combined Systolic and Diastolic CHF - ***  2. Nonischemic Cardiomyopathy - s/p BiV ICD placement in 05/2017.   3. PAT - ***  4. HTN - ***  5. Morbid Obesity - ***   Medication Adjustments/Labs and Tests Ordered: Current medicines are reviewed at length with the patient today.  Concerns regarding medicines are outlined above.  Medication changes, Labs and Tests ordered today are listed in the Patient Instructions below. There are no Patient Instructions on file for this visit.   Signed, Ellsworth Lennox, PA-C  04/10/2018 9:56 AM    [facility] [address] [place], [place] [address] Phone: [phone]

## 2018-04-11 ENCOUNTER — Ambulatory Visit: Payer: Medicare Other | Admitting: Student

## 2018-04-11 DIAGNOSIS — R0989 Other specified symptoms and signs involving the circulatory and respiratory systems: Secondary | ICD-10-CM

## 2018-04-12 ENCOUNTER — Encounter (HOSPITAL_COMMUNITY): Payer: Medicare Other

## 2018-04-12 ENCOUNTER — Encounter: Payer: Self-pay | Admitting: Student

## 2018-04-12 NOTE — Progress Notes (Signed)
No ICM remote transmission received for 04/10/2018 and next ICM transmission scheduled for 05/10/2018.

## 2018-04-20 ENCOUNTER — Other Ambulatory Visit: Payer: Self-pay

## 2018-04-20 MED ORDER — TORSEMIDE 20 MG PO TABS: 80.0000 mg | ORAL_TABLET | Freq: Two times a day (BID) | ORAL | 3 refills | Status: AC

## 2018-04-20 NOTE — Telephone Encounter (Signed)
Refilled 90 day supply of torsamide per fax request to [facility]

## 2018-05-10 ENCOUNTER — Telehealth: Payer: Self-pay

## 2018-05-10 NOTE — Telephone Encounter (Signed)
Confirmed remote transmission w/ pt husband.  Pt husband informed me that Niloufar has passed away April 29, 2018

## 2018-05-20 DEATH — deceased

## 2018-06-20 ENCOUNTER — Encounter: Payer: Medicare Other | Admitting: Internal Medicine

## 2018-09-17 ENCOUNTER — Ambulatory Visit: Payer: Medicare Other | Admitting: Physician Assistant

## 2018-10-06 IMAGING — DX DG CHEST 2V
2 series · 2 of 2 positions shown · non-contrast
Comparison: Prior radiograph from 02/01/2017.

CLINICAL DATA: Initial evaluation for acute chest pain, nausea,
dizziness.

EXAM:
CHEST  2 VIEW

[chest pa]
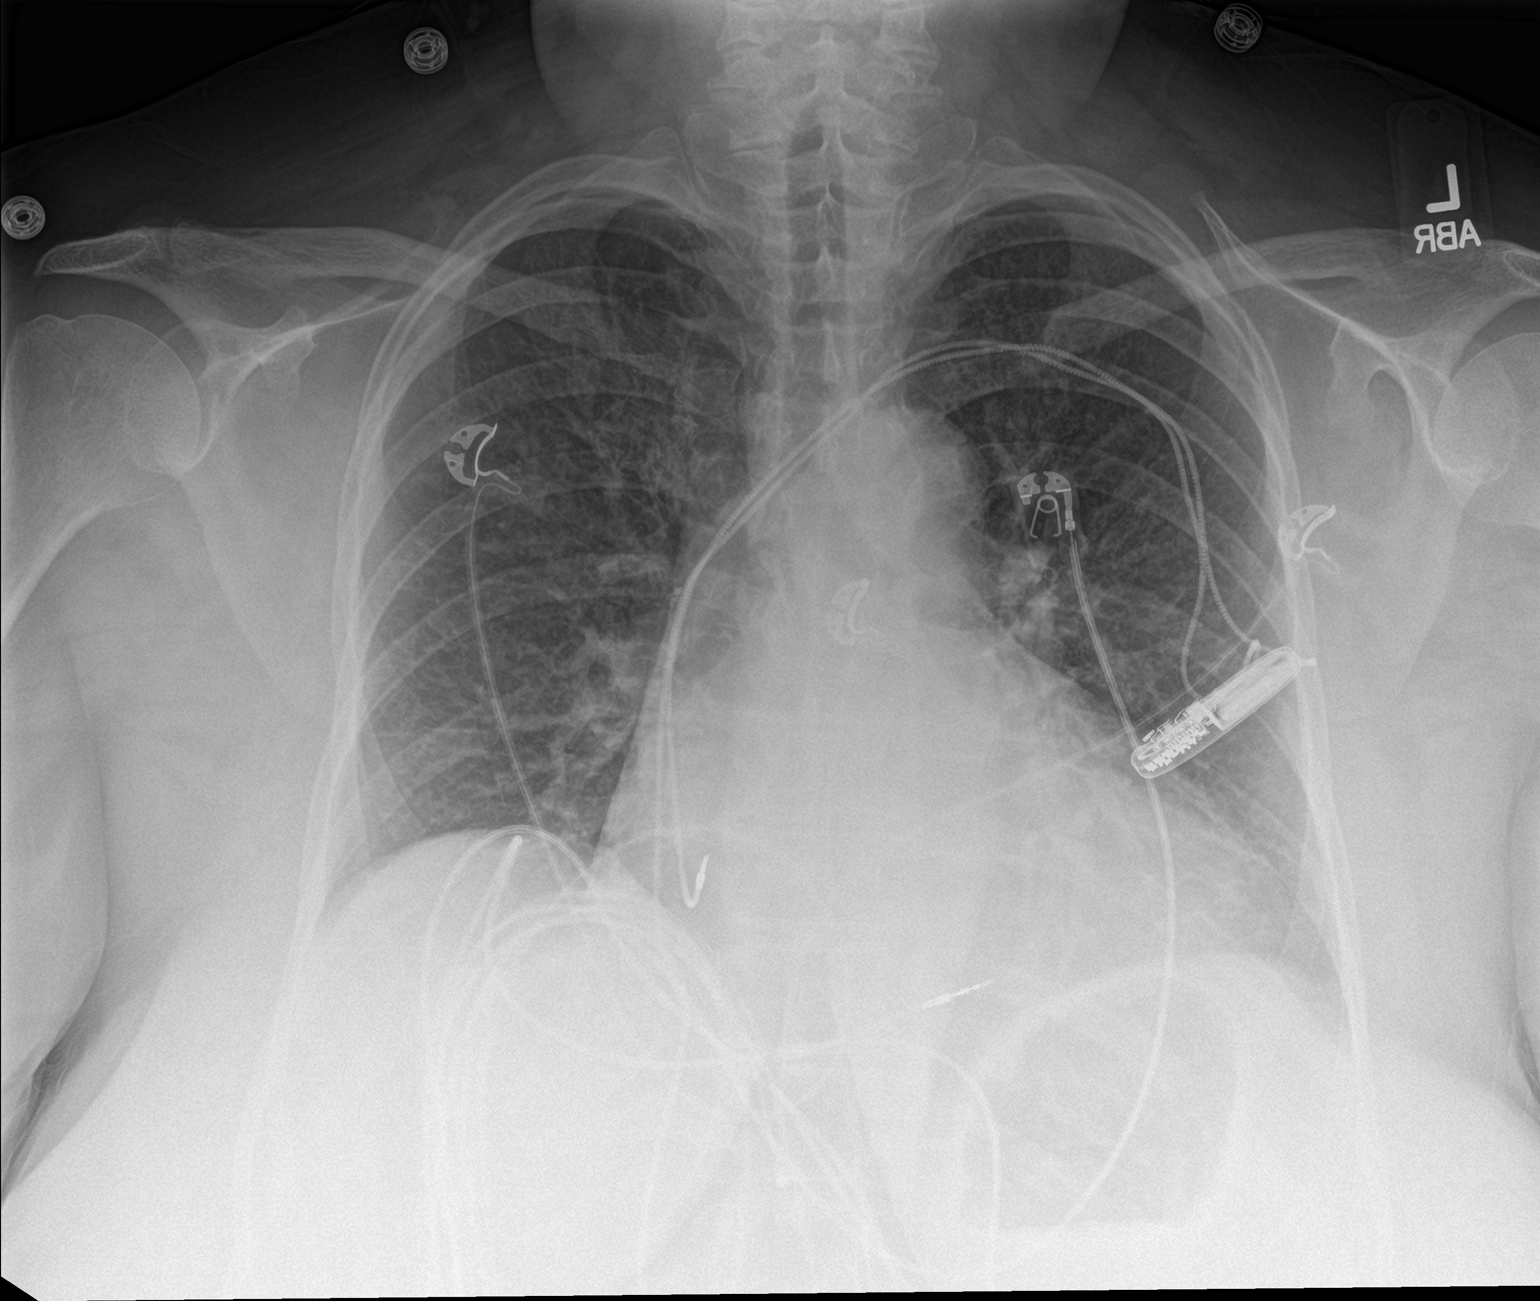

[chest lat]
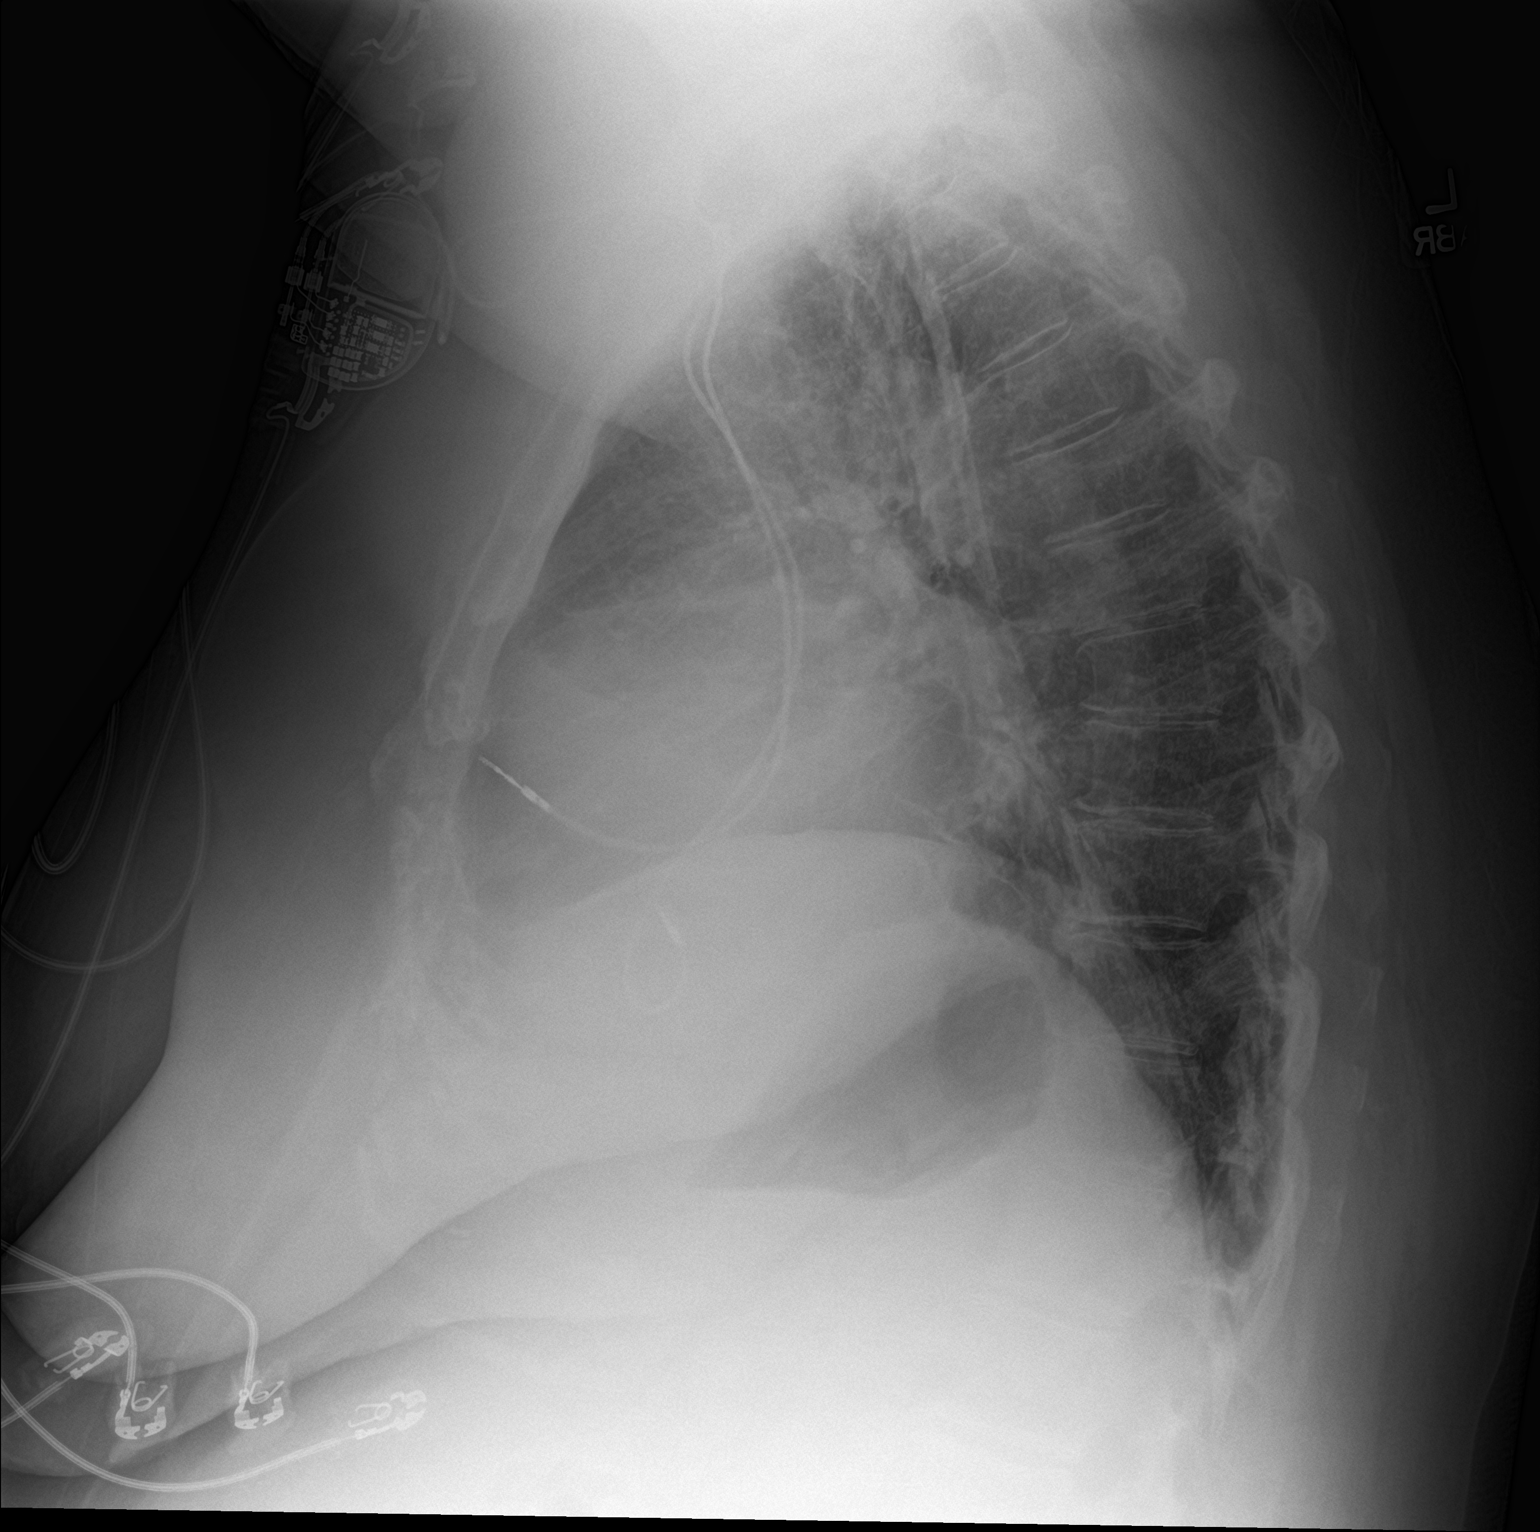

[2 of 2 positions shown; findings below may reference images not displayed]

FINDINGS: Left-sided Joel Paulo transvenous pacemaker/ AICD in place. Stable
cardiomegaly. Mediastinal silhouette normal.

Lungs hypoinflated. Diffuse pulmonary vascular congestion with
interstitial prominence, suggesting mild pulmonary interstitial
edema. No focal infiltrates. No pleural effusion. No pneumothorax.

No acute osseus abnormality.
IMPRESSION: 1. Cardiomegaly with findings suggestion of mild diffuse pulmonary
interstitial edema.
2. Left-sided transvenous pacemaker/AICD in place.

## 2019-06-13 IMAGING — DX DG CHEST 2V
2 series · 2 of 2 positions shown · non-contrast
Comparison: November 17, 2017

CLINICAL DATA: Shortness of Breath

EXAM:
CHEST - 2 VIEW

[chest pa]
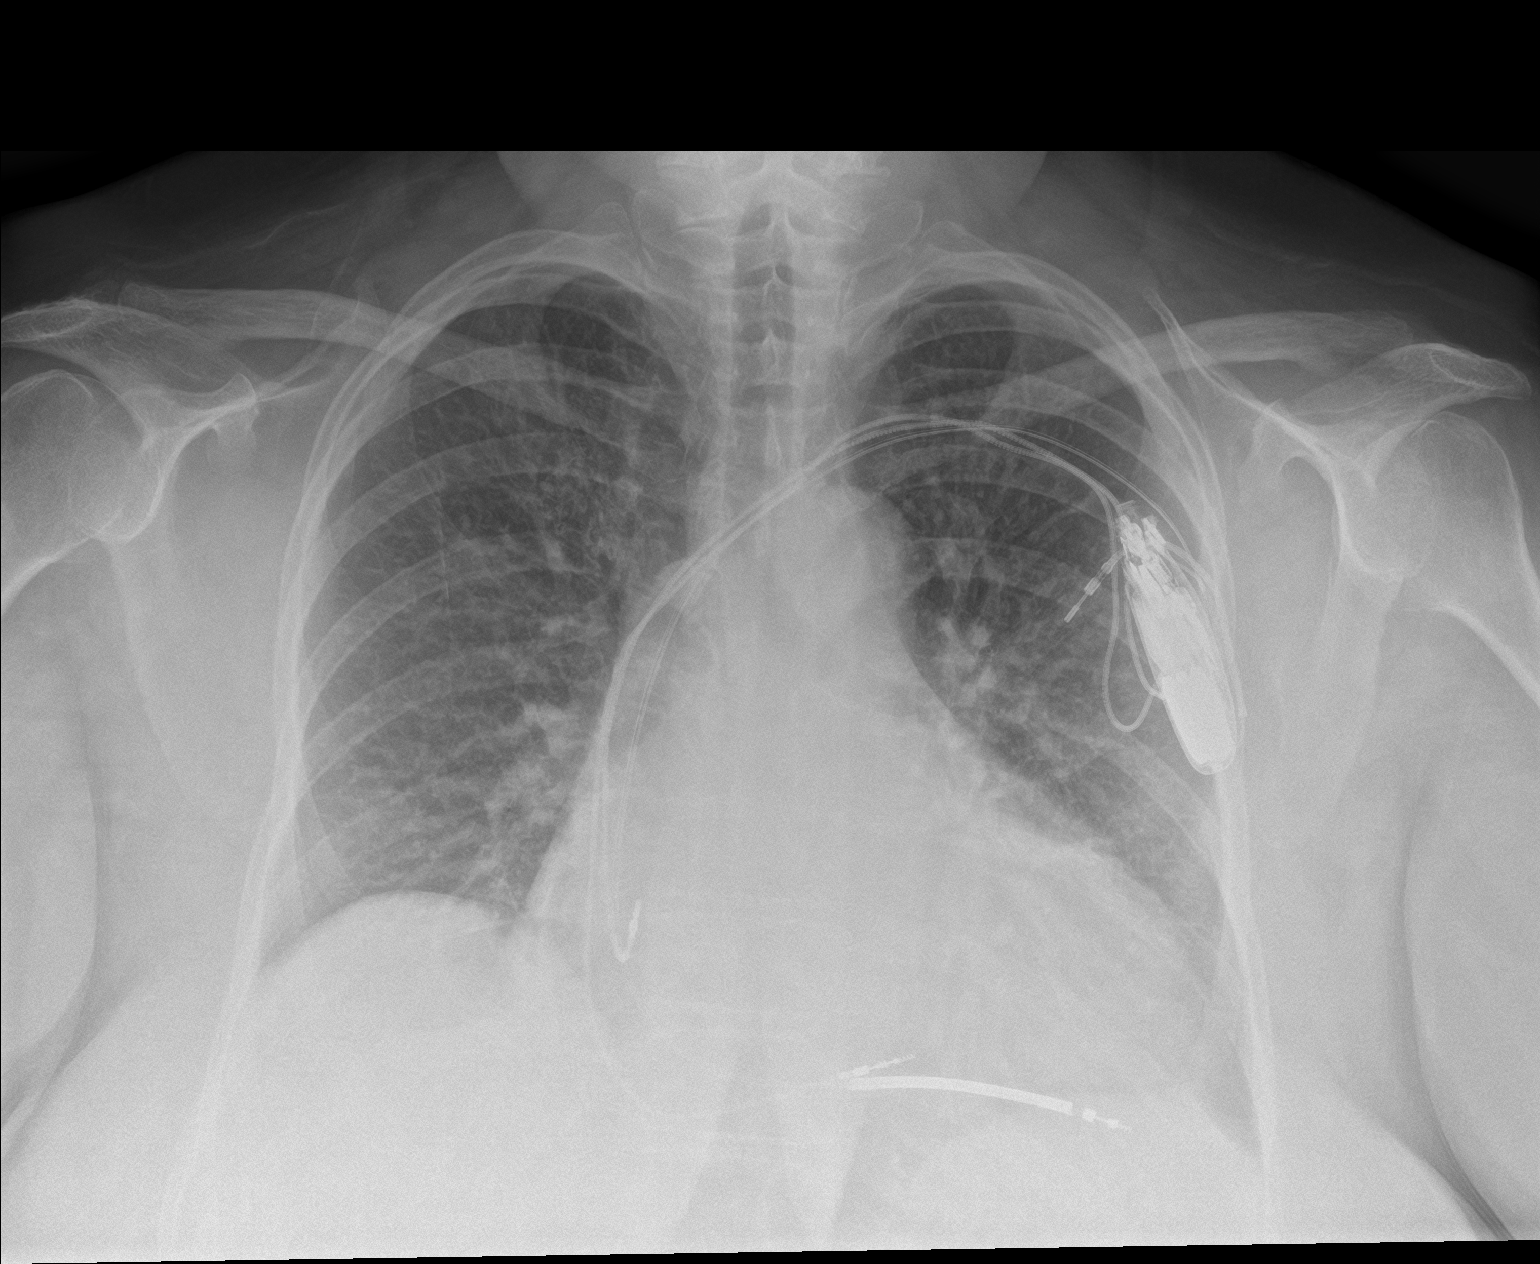

[chest lat]
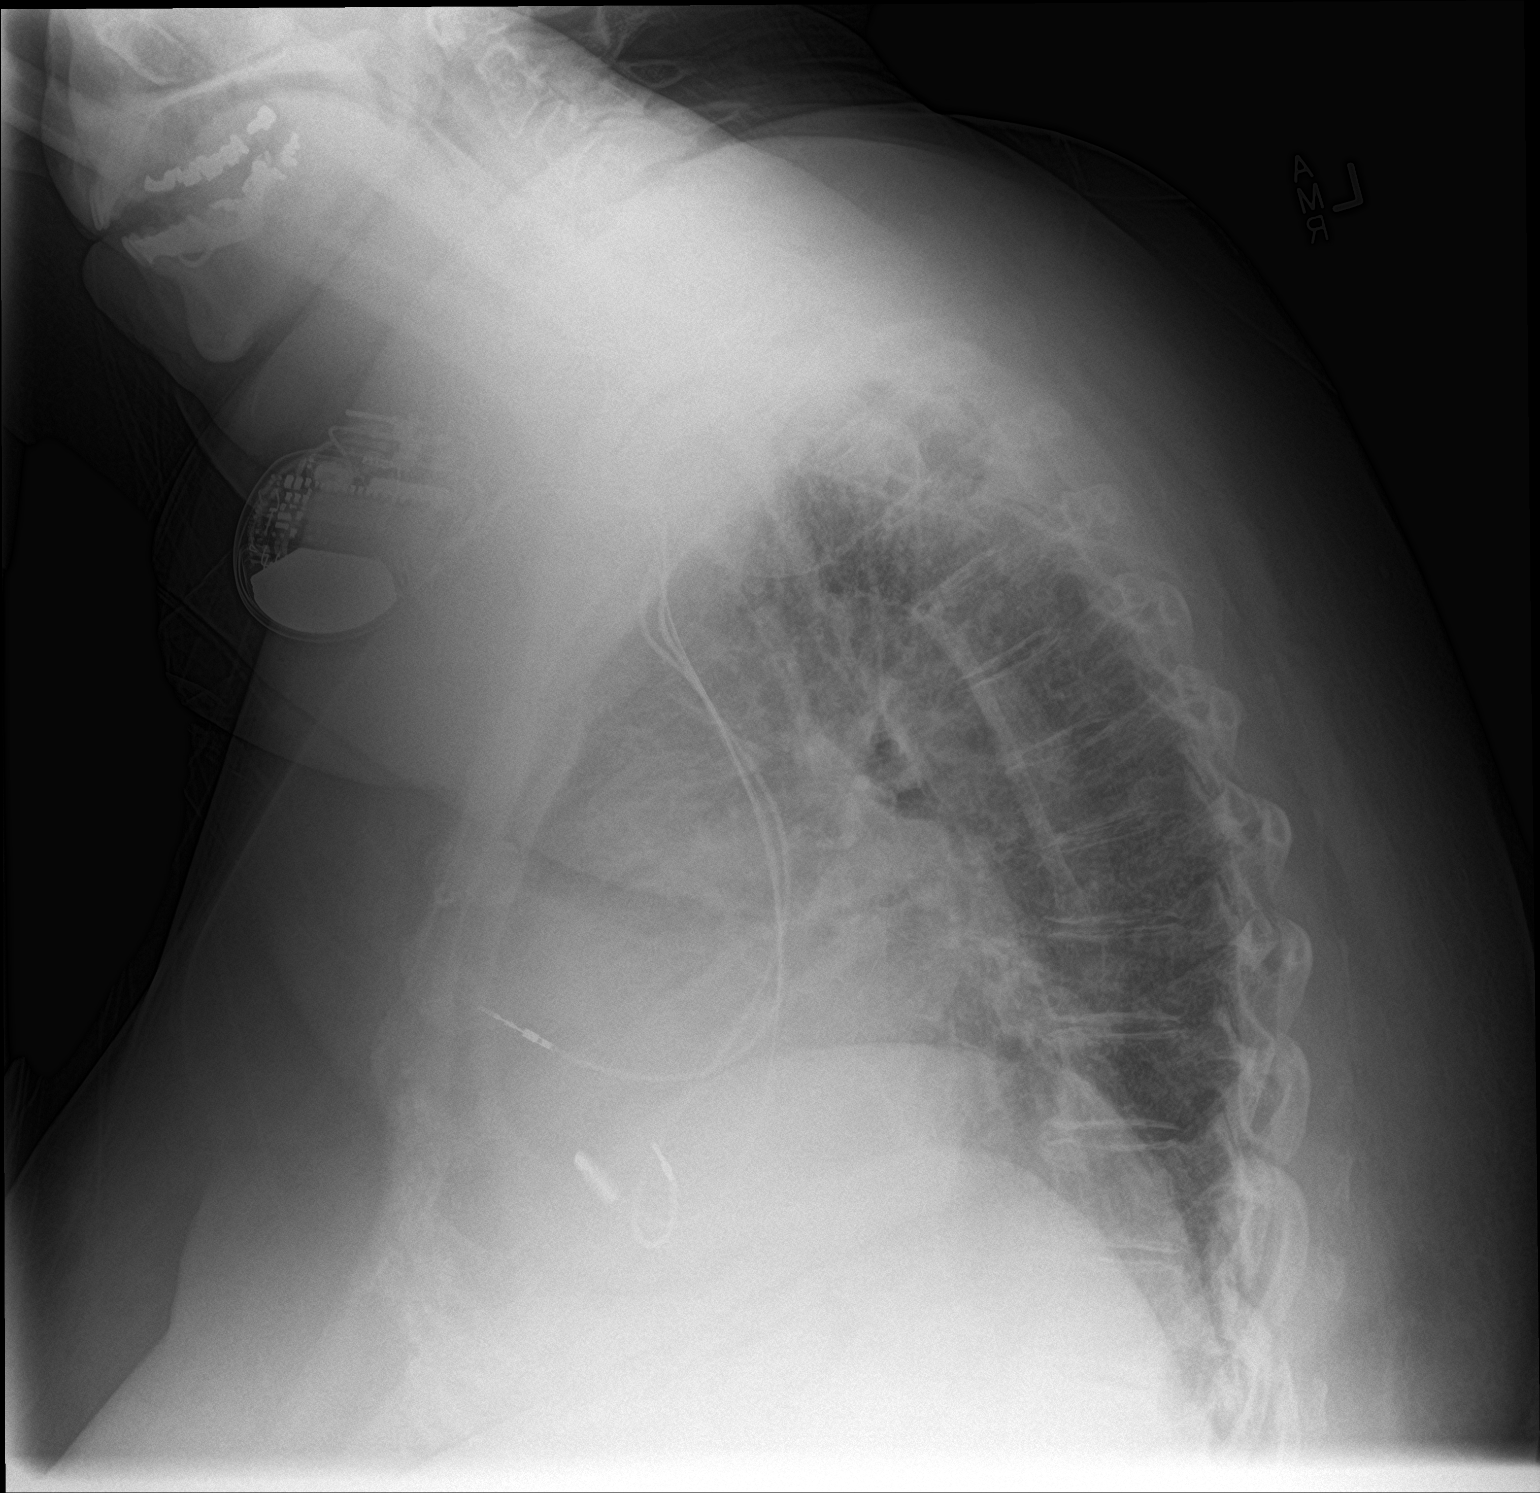

[2 of 2 positions shown; findings below may reference images not displayed]

FINDINGS: There is no edema or consolidation. There is cardiomegaly with
pulmonary venous hypertension. Pacemaker leads are attached to the
right atrium, right ventricle, and coronary sinus. No adenopathy. No
bone lesions.
IMPRESSION: Pulmonary vascular congestion without edema or consolidation.
Pacemaker leads as noted.

## 2019-07-26 IMAGING — CT CT ANGIO CHEST
2 of 6 series · 18 of 46 positions shown · IV contrast (Isovue)
Comparison: None.

CLINICAL DATA: Bradycardia, edema and weight gain.

EXAM:
CT ANGIOGRAPHY CHEST WITH CONTRAST
TECHNIQUE: Multidetector CT imaging of the chest was performed using the
standard protocol during bolus administration of intravenous
contrast. Multiplanar CT image reconstructions and MIPs were
obtained to evaluate the vascular anatomy.
CONTRAST:  125mL [id number] IOPAMIDOL ([id number]) INJECTION 76%

[Series 5: thins · axial · 0.57mm/px · z∈[-624,-382]mm · 15 of 266 slices shown]
[im 12/266  lung]
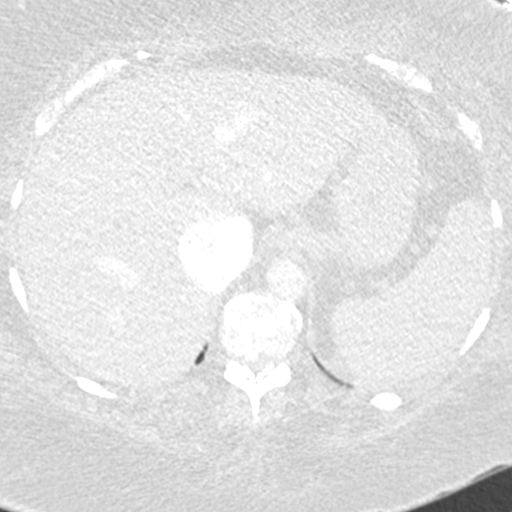
[im 35/266  soft-tissue]
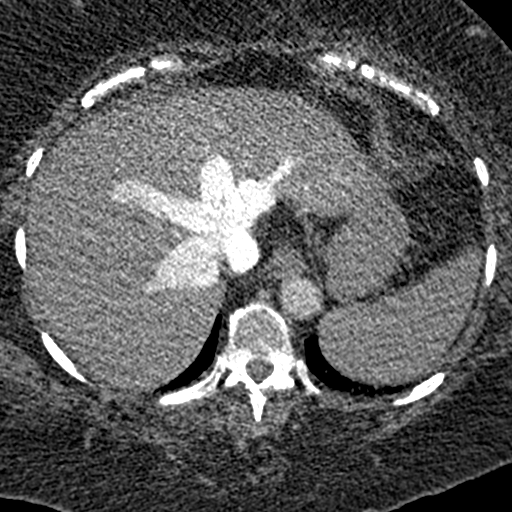
[im 47/266  lung]
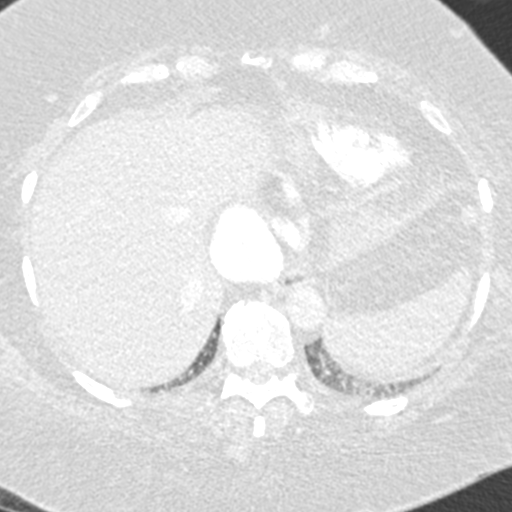
[im 70/266  soft-tissue]
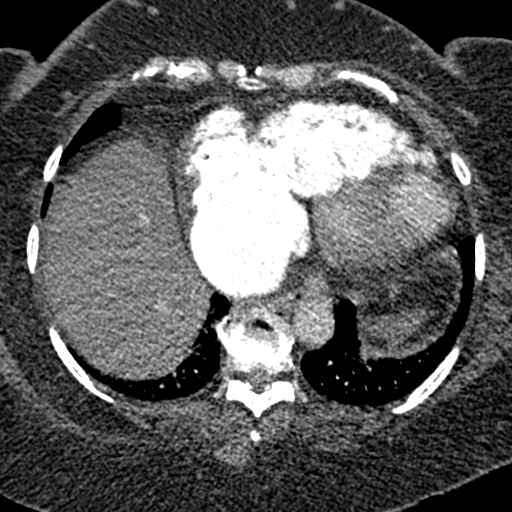
[im 81/266  lung]
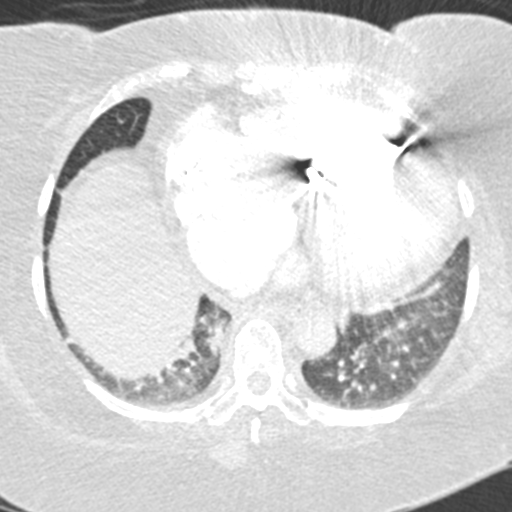
[im 104/266  soft-tissue]
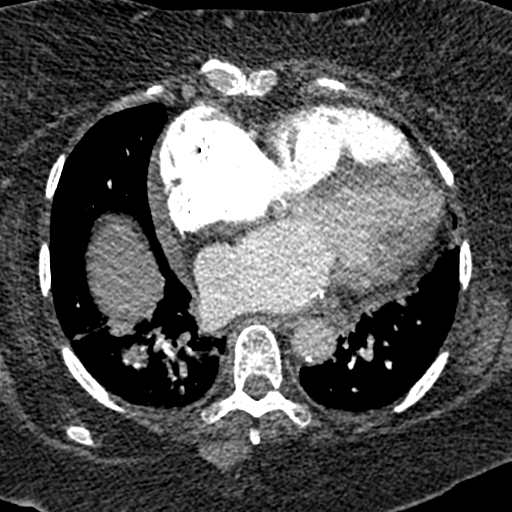
[im 116/266  lung]
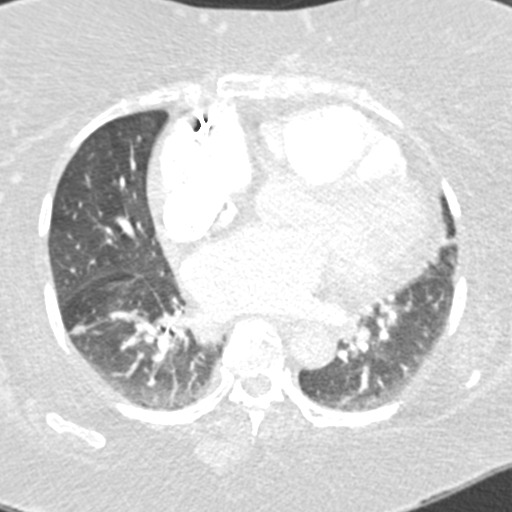
[im 139/266  soft-tissue]
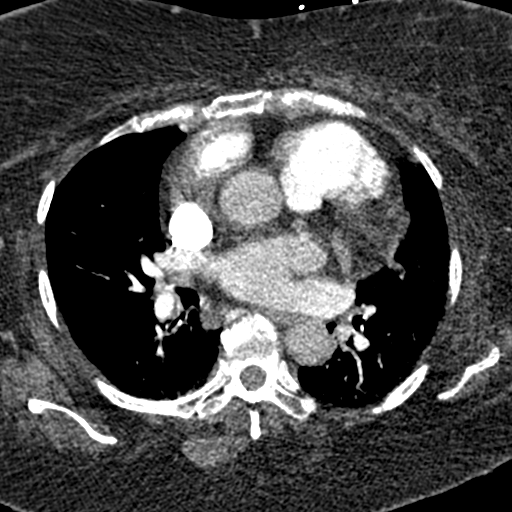
[im 150/266  lung]
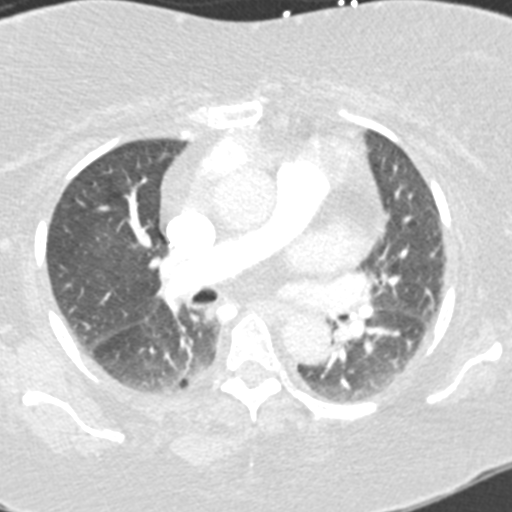
[im 162/266  soft-tissue]
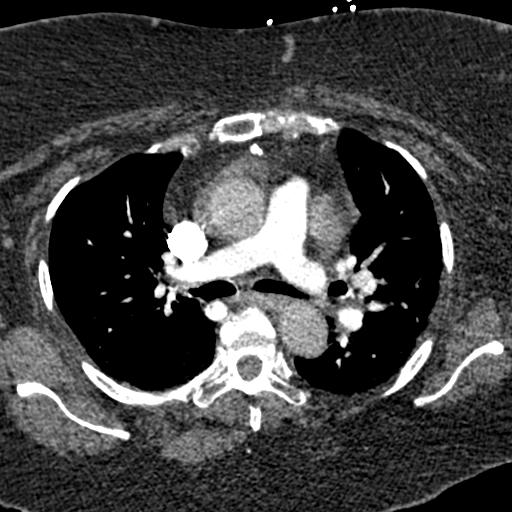
[im 185/266  lung]
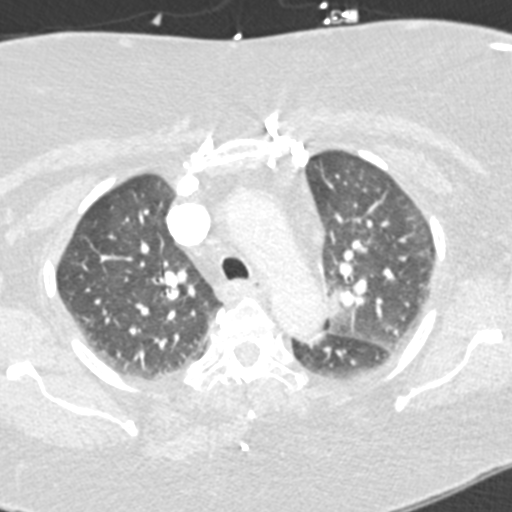
[im 196/266  soft-tissue]
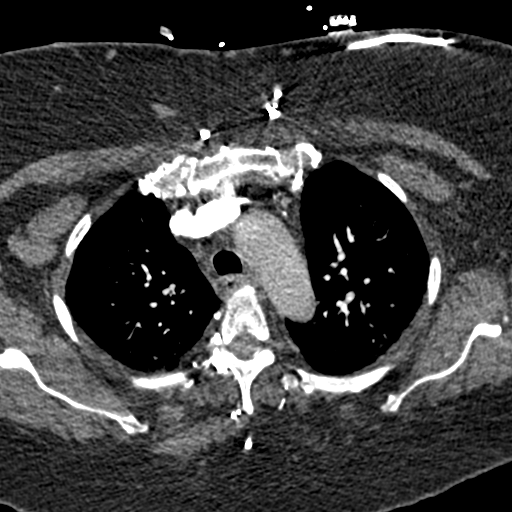
[im 219/266  lung]
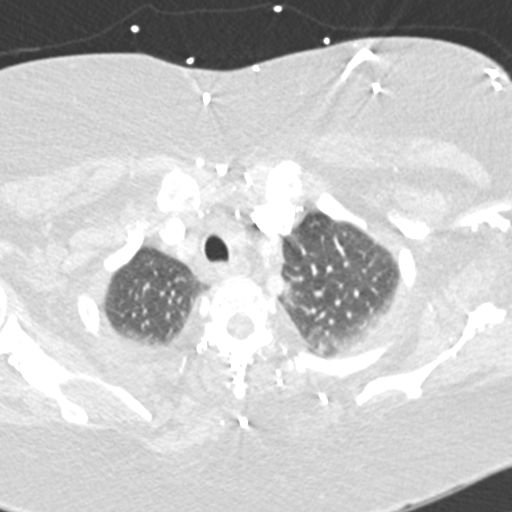
[im 231/266  soft-tissue]
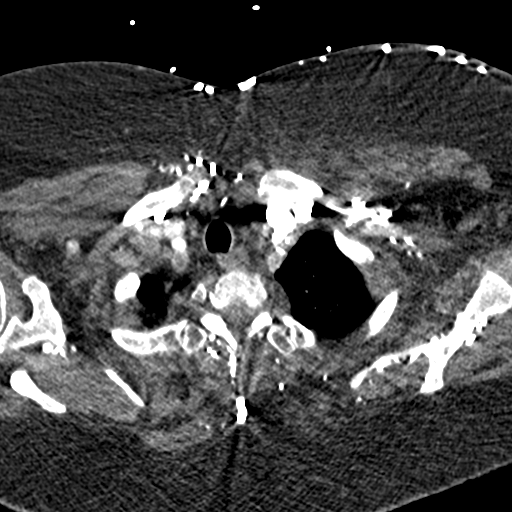
[im 254/266  lung]
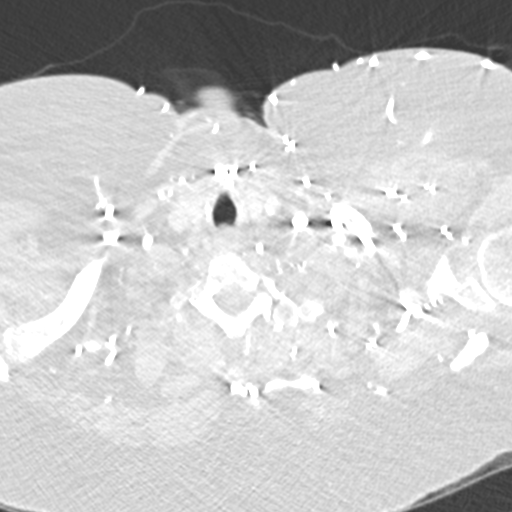

[Series 7: coronal mpr · coronal · 0.54mm/px · 3 of 151 slices shown]
[im 38/151  soft-tissue]
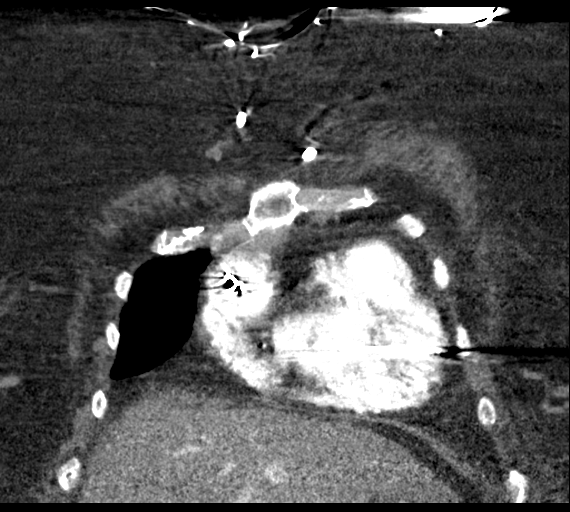
[im 76/151  soft-tissue]
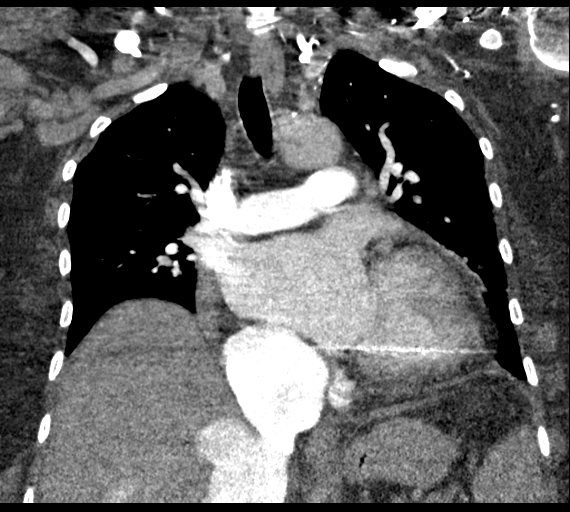
[im 113/151  soft-tissue]
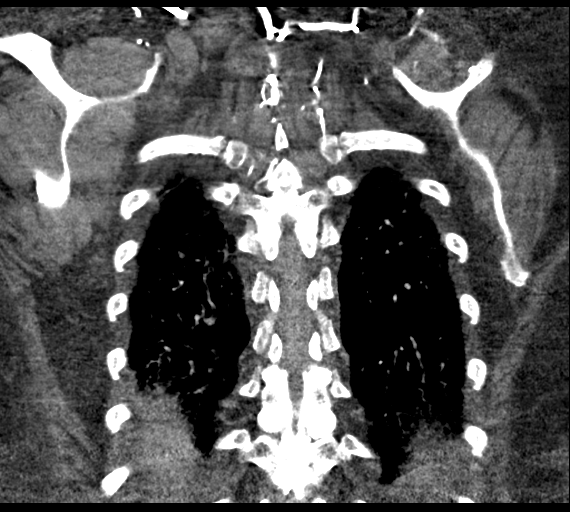

[18 of 46 positions shown; findings below may reference images not displayed]

FINDINGS: Cardiovascular: No evidence of pulmonary embolism with somewhat
limited opacification at the subsegmental level. The central
pulmonary arteries are not dilated. The heart is significantly
enlarged with enlargement all 4 chambers. The right atrium is
particularly enlarged and there is reflux of contrast into a
markedly distended intrahepatic IVC and hepatic veins consistent
with elevated right heart pressures and right heart failure. No
pericardial fluid is identified. The thoracic aorta is normal in
caliber.

Mediastinum/Nodes: No enlarged mediastinal, hilar, or axillary lymph
nodes. Thyroid gland, trachea, and esophagus demonstrate no
significant findings.

Lungs/Pleura: Lungs show pulmonary venous hypertension and
potentially mild interstitial edema without overt airspace edema.
There is a trace amount of right pleural fluid at the lung base. No
pulmonary consolidation, pneumothorax or pulmonary masses
identified.

Upper Abdomen: No acute abnormality.

Musculoskeletal: No chest wall abnormality. No acute or significant
osseous findings.

Review of the MIP images confirms the above findings.
IMPRESSION: 1. No evidence of pulmonary embolism.
2. Significant cardiac enlargement and evidence of elevated right
heart pressures and right heart failure with marked dilatation of
the right atrium and reflux into markedly distended intrahepatic IVC
and hepatic veins.
3. Pulmonary venous hypertensive changes in the lungs with potential
mild interstitial edema. No overt airspace edema. Trace right
pleural fluid.
